# Patient Record
Sex: Male | Born: 1935
Health system: Southern US, Community
[De-identification: ages and names within clinical notes are randomized; demographics above are authoritative.]

## PROBLEM LIST (undated history)

## (undated) DIAGNOSIS — K5790 Diverticulosis of intestine, part unspecified, without perforation or abscess without bleeding: Secondary | ICD-10-CM

## (undated) DIAGNOSIS — D61818 Other pancytopenia: Secondary | ICD-10-CM

## (undated) DIAGNOSIS — D649 Anemia, unspecified: Secondary | ICD-10-CM

## (undated) DIAGNOSIS — C8300 Small cell B-cell lymphoma, unspecified site: Secondary | ICD-10-CM

## (undated) HISTORY — DX: Small cell B-cell lymphoma, unspecified site: C83.00

## (undated) HISTORY — DX: Anemia, unspecified: D64.9

## (undated) HISTORY — DX: Diverticulosis of intestine, part unspecified, without perforation or abscess without bleeding: K57.90

## (undated) HISTORY — DX: Other pancytopenia: D61.818

## (undated) HISTORY — PX: CATARACT EXTRACTION: SUR2

## (undated) HISTORY — PX: TONSILLECTOMY: SUR1361

---

## 2009-06-26 ENCOUNTER — Ambulatory Visit (HOSPITAL_COMMUNITY): Admission: RE | Admit: 2009-06-26 | Discharge: 2009-06-26 | Payer: Self-pay | Admitting: Ophthalmology

## 2009-07-03 ENCOUNTER — Ambulatory Visit (HOSPITAL_COMMUNITY): Admission: RE | Admit: 2009-07-03 | Discharge: 2009-07-03 | Payer: Self-pay | Admitting: Ophthalmology

## 2011-02-21 LAB — BASIC METABOLIC PANEL
BUN: 16 mg/dL (ref 6–23)
CO2: 29 mEq/L (ref 19–32)
Calcium: 8.9 mg/dL (ref 8.4–10.5)
GFR calc non Af Amer: >60 mL/min (ref >60)
Glucose, Bld: 97 mg/dL (ref 70–99)
Sodium: 138 mEq/L (ref 135–145)

## 2012-06-14 ENCOUNTER — Encounter (INDEPENDENT_AMBULATORY_CARE_PROVIDER_SITE_OTHER): Payer: Self-pay | Admitting: Internal Medicine

## 2012-06-14 DIAGNOSIS — D696 Thrombocytopenia, unspecified: Secondary | ICD-10-CM

## 2012-06-14 DIAGNOSIS — D72819 Decreased white blood cell count, unspecified: Secondary | ICD-10-CM

## 2012-10-05 ENCOUNTER — Encounter (INDEPENDENT_AMBULATORY_CARE_PROVIDER_SITE_OTHER): Payer: Medicare Other | Admitting: Internal Medicine

## 2012-10-05 DIAGNOSIS — D72819 Decreased white blood cell count, unspecified: Secondary | ICD-10-CM

## 2012-10-05 DIAGNOSIS — D693 Immune thrombocytopenic purpura: Secondary | ICD-10-CM

## 2013-04-11 ENCOUNTER — Encounter (INDEPENDENT_AMBULATORY_CARE_PROVIDER_SITE_OTHER): Payer: Medicare Other

## 2013-04-11 DIAGNOSIS — D696 Thrombocytopenia, unspecified: Secondary | ICD-10-CM

## 2013-04-11 DIAGNOSIS — N400 Enlarged prostate without lower urinary tract symptoms: Secondary | ICD-10-CM

## 2016-04-22 ENCOUNTER — Telehealth (HOSPITAL_COMMUNITY): Payer: Self-pay | Admitting: Oncology

## 2016-04-22 ENCOUNTER — Encounter (HOSPITAL_COMMUNITY): Payer: Self-pay | Admitting: Oncology

## 2016-04-22 DIAGNOSIS — D61818 Other pancytopenia: Secondary | ICD-10-CM | POA: Insufficient documentation

## 2016-04-22 DIAGNOSIS — D649 Anemia, unspecified: Secondary | ICD-10-CM

## 2016-04-22 HISTORY — DX: Anemia, unspecified: D64.9

## 2016-04-22 HISTORY — DX: Other pancytopenia: D61.818

## 2016-04-22 NOTE — Telephone Encounter (Signed)
Dr. Octavio Graves called regarding this patient at 850 hours today, essentially requesting an appointment sooner than planned.  She is   She notes that lab work from 03/17/16 demonstrates a HGB of 10.6 and platelet count of 95,000.  Repeat labs from 5/31 show a HGB of 9.3 and platelet count of 81,000.  She notes a neutropenia at 1.2.  She notes concern for leukemia.  No further work-up noted at this time for anemia and thrombocytopenia.    I have reviewed the faxed paper chart available to me from the time of referral labs going back in time are as follows: Labs on 5/3: WBC 2.4 HGB 10.6 MCV 89 MCHC 32 Plt 95 Neutrophil 1.2 Labs on 01/07/15 WBC 5.0 HGB 14 MCV 88.6 MCHC 32.4 PLT 133 Neutrophils 2.8 Labs from 06/01/2012 WBC 2.6 HGB 12.9 MCV 90.3 MCHC 32.5 PLT 97 Neutrophils 1.2  Due to Dr. Rosanne Ashing concern, we will set the patient up for a lab appointment and based upon results, we will adjust the patient's appointment accordingly.  Based upon the patient's past lab work and records available to me, his labs are relatively stable EXCEPT for a new anemia.  His platelet count is not changed much compared to 2013 and he has a neutropenia dating back to at least 2013 as well.  Labs: CBC diff, CMET, LDH, anemia panel, ESR, CRP, Retic count, haptoglobin, EPO level, SPEP + IFE, light chain assay, pathologist smear review, and FLOW Cytometry.  Stool cards x 3.  Based upon lab results as mentioned above, we will adjust the patient's consultation appointment accordingly.  I will contact Dr. Melina Copa personally to update her regarding our plan of action and we will contact the patient to schedule a lab appointment.  Rheana Casebolt, PA-C 04/22/2016 9:06 AM

## 2016-04-23 ENCOUNTER — Other Ambulatory Visit (HOSPITAL_COMMUNITY): Payer: Self-pay | Admitting: Oncology

## 2016-04-23 ENCOUNTER — Encounter (HOSPITAL_COMMUNITY): Payer: Medicare Other | Attending: Hematology & Oncology

## 2016-04-23 DIAGNOSIS — D61818 Other pancytopenia: Secondary | ICD-10-CM | POA: Insufficient documentation

## 2016-04-23 DIAGNOSIS — R5383 Other fatigue: Secondary | ICD-10-CM | POA: Insufficient documentation

## 2016-04-23 DIAGNOSIS — D696 Thrombocytopenia, unspecified: Secondary | ICD-10-CM | POA: Diagnosis not present

## 2016-04-23 DIAGNOSIS — Z9889 Other specified postprocedural states: Secondary | ICD-10-CM | POA: Insufficient documentation

## 2016-04-23 DIAGNOSIS — Z79899 Other long term (current) drug therapy: Secondary | ICD-10-CM | POA: Diagnosis not present

## 2016-04-23 DIAGNOSIS — Z87891 Personal history of nicotine dependence: Secondary | ICD-10-CM | POA: Insufficient documentation

## 2016-04-23 DIAGNOSIS — D649 Anemia, unspecified: Secondary | ICD-10-CM | POA: Diagnosis not present

## 2016-04-23 LAB — COMPREHENSIVE METABOLIC PANEL
ALBUMIN: 3.5 g/dL (ref 3.5–5.0)
ALK PHOS: 106 U/L (ref 38–126)
ALT: 13 U/L — AB (ref 17–63)
AST: 19 U/L (ref 15–41)
Anion gap: 5 (ref 5–15)
BILIRUBIN TOTAL: 0.5 mg/dL (ref 0.3–1.2)
BUN: 21 mg/dL — AB (ref 6–20)
CALCIUM: 8.6 mg/dL — AB (ref 8.9–10.3)
CO2: 26 mmol/L (ref 22–32)
CREATININE: 1.48 mg/dL — AB (ref 0.61–1.24)
Chloride: 103 mmol/L (ref 101–111)
GFR calc Af Amer: 49 mL/min — ABNORMAL LOW (ref >60)
GFR calc non Af Amer: 42 mL/min — ABNORMAL LOW (ref >60)
GLUCOSE: 111 mg/dL — AB (ref 65–99)
Potassium: 4.1 mmol/L (ref 3.5–5.1)
SODIUM: 134 mmol/L — AB (ref 135–145)
Total Protein: 7.4 g/dL (ref 6.5–8.1)

## 2016-04-23 LAB — RETICULOCYTES
RBC.: 3.13 MIL/uL — AB (ref 4.22–5.81)
RETIC COUNT ABSOLUTE: 53.2 10*3/uL (ref 19.0–186.0)
Retic Ct Pct: 1.7 % (ref 0.4–3.1)

## 2016-04-23 LAB — CBC WITH DIFFERENTIAL/PLATELET
BASOS PCT: 1 %
Basophils Absolute: 0 10*3/uL (ref 0.0–0.1)
EOS ABS: 0.1 10*3/uL (ref 0.0–0.7)
Eosinophils Relative: 5 %
HEMATOCRIT: 27.1 % — AB (ref 39.0–52.0)
HEMOGLOBIN: 9 g/dL — AB (ref 13.0–17.0)
Lymphocytes Relative: 36 %
Lymphs Abs: 0.8 10*3/uL (ref 0.7–4.0)
MCH: 28.8 pg (ref 26.0–34.0)
MCHC: 33.2 g/dL (ref 30.0–36.0)
MCV: 86.6 fL (ref 78.0–100.0)
Monocytes Absolute: 0.2 10*3/uL (ref 0.1–1.0)
Monocytes Relative: 11 %
NEUTROS ABS: 1 10*3/uL — AB (ref 1.7–7.7)
NEUTROS PCT: 48 %
Platelets: 77 10*3/uL — ABNORMAL LOW (ref 150–400)
RBC: 3.13 MIL/uL — AB (ref 4.22–5.81)
RDW: 15.1 % (ref 11.5–15.5)
WBC: 2.1 10*3/uL — AB (ref 4.0–10.5)

## 2016-04-23 LAB — SEDIMENTATION RATE: Sed Rate: 70 mm/hr — ABNORMAL HIGH (ref 0–16)

## 2016-04-23 LAB — C-REACTIVE PROTEIN: CRP: 1.8 mg/dL — ABNORMAL HIGH (ref <1.0)

## 2016-04-23 LAB — IRON AND TIBC
IRON: 28 ug/dL — AB (ref 45–182)
Saturation Ratios: 13 % — ABNORMAL LOW (ref 17.9–39.5)
TIBC: 223 ug/dL — AB (ref 250–450)
UIBC: 195 ug/dL

## 2016-04-23 LAB — FERRITIN: FERRITIN: 77 ng/mL (ref 24–336)

## 2016-04-23 LAB — LACTATE DEHYDROGENASE: LDH: 130 U/L (ref 98–192)

## 2016-04-23 LAB — FOLATE: FOLATE: 36.5 ng/mL (ref >5.9)

## 2016-04-23 LAB — VITAMIN B12: Vitamin B-12: 241 pg/mL (ref 180–914)

## 2016-04-24 LAB — ERYTHROPOIETIN: ERYTHROPOIETIN: 62.3 m[IU]/mL — AB (ref 2.6–18.5)

## 2016-04-24 LAB — IGG, IGA, IGM
IgA: 198 mg/dL (ref 61–437)
IgG (Immunoglobin G), Serum: 733 mg/dL (ref 700–1600)
IgM, Serum: 1840 mg/dL — ABNORMAL HIGH (ref 15–143)

## 2016-04-24 LAB — HAPTOGLOBIN: HAPTOGLOBIN: 40 mg/dL (ref 34–200)

## 2016-04-26 LAB — KAPPA/LAMBDA LIGHT CHAINS
KAPPA FREE LGHT CHN: 189.8 mg/L — AB (ref 3.3–19.4)
Kappa, lambda light chain ratio: 11.03 — ABNORMAL HIGH (ref 0.26–1.65)
Lambda free light chains: 17.2 mg/L (ref 5.7–26.3)

## 2016-04-26 LAB — PROTEIN ELECTROPHORESIS, SERUM
A/G RATIO SPE: 0.9 (ref 0.7–1.7)
ALBUMIN ELP: 3.4 g/dL (ref 2.9–4.4)
Alpha-1-Globulin: 0.3 g/dL (ref 0.0–0.4)
Alpha-2-Globulin: 0.5 g/dL (ref 0.4–1.0)
BETA GLOBULIN: 0.8 g/dL (ref 0.7–1.3)
GLOBULIN, TOTAL: 3.6 g/dL (ref 2.2–3.9)
Gamma Globulin: 2.1 g/dL — ABNORMAL HIGH (ref 0.4–1.8)
M-Spike, %: 1.4 g/dL — ABNORMAL HIGH
TOTAL PROTEIN ELP: 7 g/dL (ref 6.0–8.5)

## 2016-04-26 LAB — PATHOLOGIST SMEAR REVIEW

## 2016-04-26 LAB — IMMUNOFIXATION ELECTROPHORESIS
IGM, SERUM: 1917 mg/dL — AB (ref 15–143)
IgA: 191 mg/dL (ref 61–437)
IgG (Immunoglobin G), Serum: 723 mg/dL (ref 700–1600)
TOTAL PROTEIN ELP: 7.1 g/dL (ref 6.0–8.5)

## 2016-05-04 ENCOUNTER — Other Ambulatory Visit (HOSPITAL_COMMUNITY): Payer: Self-pay | Admitting: *Deleted

## 2016-05-04 DIAGNOSIS — D649 Anemia, unspecified: Secondary | ICD-10-CM

## 2016-05-04 DIAGNOSIS — D61818 Other pancytopenia: Secondary | ICD-10-CM | POA: Diagnosis not present

## 2016-05-04 LAB — OCCULT BLOOD X 1 CARD TO LAB, STOOL
FECAL OCCULT BLD: NEGATIVE
Fecal Occult Bld: NEGATIVE
Fecal Occult Bld: POSITIVE — AB

## 2016-05-05 ENCOUNTER — Other Ambulatory Visit: Payer: Self-pay | Admitting: Radiology

## 2016-05-06 ENCOUNTER — Ambulatory Visit (HOSPITAL_COMMUNITY)
Admission: RE | Admit: 2016-05-06 | Discharge: 2016-05-06 | Disposition: A | Discharge status: Home / Self Care | Payer: Medicare Other | Source: Ambulatory Visit | Attending: Oncology | Admitting: Oncology

## 2016-05-06 ENCOUNTER — Encounter (HOSPITAL_COMMUNITY): Payer: Self-pay

## 2016-05-06 DIAGNOSIS — C8580 Other specified types of non-Hodgkin lymphoma, unspecified site: Secondary | ICD-10-CM | POA: Diagnosis not present

## 2016-05-06 DIAGNOSIS — Z87891 Personal history of nicotine dependence: Secondary | ICD-10-CM | POA: Diagnosis not present

## 2016-05-06 DIAGNOSIS — D649 Anemia, unspecified: Secondary | ICD-10-CM | POA: Insufficient documentation

## 2016-05-06 DIAGNOSIS — D61818 Other pancytopenia: Secondary | ICD-10-CM

## 2016-05-06 LAB — CBC
HCT: 25.3 % — ABNORMAL LOW (ref 39.0–52.0)
Hemoglobin: 8.2 g/dL — ABNORMAL LOW (ref 13.0–17.0)
MCH: 27.7 pg (ref 26.0–34.0)
MCHC: 32.4 g/dL (ref 30.0–36.0)
MCV: 85.5 fL (ref 78.0–100.0)
PLATELETS: 85 10*3/uL — AB (ref 150–400)
RBC: 2.96 MIL/uL — ABNORMAL LOW (ref 4.22–5.81)
RDW: 15.6 % — AB (ref 11.5–15.5)
WBC: 2.3 10*3/uL — AB (ref 4.0–10.5)

## 2016-05-06 LAB — PROTIME-INR
INR: 1.18 (ref 0.00–1.49)
Prothrombin Time: 15.1 seconds (ref 11.6–15.2)

## 2016-05-06 LAB — BONE MARROW EXAM

## 2016-05-06 LAB — APTT: APTT: 34 s (ref 24–37)

## 2016-05-06 MED ORDER — MIDAZOLAM HCL 2 MG/2ML IJ SOLN
INTRAMUSCULAR | Status: AC
  Filled 2016-05-06: qty 6

## 2016-05-06 MED ORDER — FENTANYL CITRATE (PF) 100 MCG/2ML IJ SOLN
INTRAMUSCULAR | Status: AC
  Filled 2016-05-06: qty 4

## 2016-05-06 MED ORDER — FENTANYL CITRATE (PF) 100 MCG/2ML IJ SOLN
INTRAMUSCULAR | Status: AC | PRN
  Administered 2016-05-06: 25 ug via INTRAVENOUS
  Administered 2016-05-06: 50 ug via INTRAVENOUS

## 2016-05-06 MED ORDER — SODIUM CHLORIDE 0.9 % IV SOLN
Freq: Once | INTRAVENOUS | Status: AC
  Administered 2016-05-06: 08:00:00 via INTRAVENOUS

## 2016-05-06 MED ORDER — HYDROCODONE-ACETAMINOPHEN 5-325 MG PO TABS: 1.0000 | ORAL_TABLET | ORAL | Status: DC | PRN

## 2016-05-06 MED ORDER — MIDAZOLAM HCL 2 MG/2ML IJ SOLN
INTRAMUSCULAR | Status: AC | PRN
  Administered 2016-05-06 (×3): 1 mg via INTRAVENOUS

## 2016-05-06 NOTE — Procedures (Signed)
CT guided bone marrow biopsy.   3 aspirates and 2 cores obtained.  No immediate complication. Minimal blood loss.

## 2016-05-06 NOTE — Consult Note (Signed)
Chief Complaint: Patient was seen in consultation today for CT-guided bone marrow biopsy  Referring Physician(s): [facility]  Supervising Physician: Markus Daft  Patient Status: Outpatient  History of Present Illness: Paul Keith is a 82 y.o. male with history of pancytopenia and elevated kappa/lambda light chain ratio who presents today for CT-guided bone marrow biopsy for further evaluation. Patient has no known history of cancer and denies history of diabetes, coronary artery disease, hypertension or respiratory problems. He states that he does have 2 recent  lumbar compression fractures.   Past Medical History  Diagnosis Date  . Anemia 04/22/2016  . Pancytopenia ([place]) 04/22/2016    Past Surgical History  Procedure Laterality Date  . Cataract extraction Bilateral   . Tonsillectomy      Allergies: Review of patient's allergies indicates no known allergies.  Medications: Prior to Admission medications   Medication Sig Start Date End Date Taking? Authorizing Provider  ibuprofen (ADVIL,MOTRIN) 200 MG tablet Take 200 mg by mouth as needed.   Yes Historical Provider, MD  tamsulosin (FLOMAX) 0.4 MG CAPS capsule Take 0.4 mg by mouth.   Yes Historical Provider, MD     History reviewed. No pertinent family history.  Social History   Social History  . Marital Status: Married    Spouse Name: N/A  . Number of Children: N/A  . Years of Education: N/A   Social History Main Topics  . Smoking status: Former Smoker    Quit date: 09/15/1962  . Smokeless tobacco: Never Used  . Alcohol Use: Yes     Comment: OCCASSIONAL  . Drug Use: No  . Sexual Activity: Not Asked   Other Topics Concern  . None   Social History Narrative      Review of Systems patient denies fever, headache, chest pain, dyspnea, cough, abdominal pain, nausea, vomiting or abnormal bleeding. He has had weight loss, fatigue and intermittent back pain.  Vital Signs: BP 134/69 mmHg  Pulse 84   Temp(Src) 98.2 F (36.8 C) (Oral)  Resp 18  Ht 6' (1.829 m)  Wt 168 lb 3.2 oz (76.295 kg)  BMI 22.81 kg/m2  SpO2 100%  Physical Exam patient awake, alert. Chest clear to auscultation bilaterally. Heart with regular rate and rhythm. Abdomen soft, positive bowel sounds, nontender. Lower extremities with no significant edema  Mallampati Score:     Imaging: No results found.  Labs:  CBC:  Recent Labs  04/23/16 1005 05/06/16 0735  WBC 2.1* 2.3*  HGB 9.0* 8.2*  HCT 27.1* 25.3*  PLT 77* 85*    COAGS:  Recent Labs  05/06/16 0735  INR 1.18  APTT 34    BMP:  Recent Labs  04/23/16 1005  NA 134*  K 4.1  CL 103  CO2 26  GLUCOSE 111*  BUN 21*  CALCIUM 8.6*  CREATININE 1.48*  GFRNONAA 42*  GFRAA 49*    LIVER FUNCTION TESTS:  Recent Labs  04/23/16 1005  BILITOT 0.5  AST 19  ALT 13*  ALKPHOS 106  PROT 7.4  ALBUMIN 3.5    TUMOR MARKERS: No results for input(s): AFPTM, CEA, CA199, CHROMGRNA in the last 8760 hours.  Assessment and Plan: Patient with history of pancytopenia/elevated kappa/lambda light chain ratio. Plan today is for CT-guided bone marrow biopsy for further evaluation.Risks and benefits discussed with the patient/family including, but not limited to bleeding, infection, damage to adjacent structures or low yield requiring additional tests.All of the patient's questions were answered, patient is agreeable to proceed.Consent  signed and in chart.     Thank you for this interesting consult.  I greatly enjoyed meeting Youcef Klas and look forward to participating in their care.  A copy of this report was sent to the requesting provider on this date.  Electronically Signed: D. Rowe Robert 05/06/2016, 8:22 AM   I spent a total of 20 minutes   in face to face in clinical consultation, greater than 50% of which was counseling/coordinating care for CT guided bone marrow biopsy

## 2016-05-06 NOTE — Discharge Instructions (Signed)
Bone Marrow Aspiration and Bone Marrow Biopsy °Bone marrow aspiration and bone marrow biopsy are procedures that are done to diagnose blood disorders. You may also have one of these procedures to help diagnose infections or some types of cancer. °Bone marrow is the soft tissue that is inside your bones. Blood cells are produced in bone marrow. For bone marrow aspiration, a sample of tissue in liquid form is removed from inside your bone. For a bone marrow biopsy, a small core of bone marrow tissue is removed. Then these samples are examined under a microscope or tested in a lab. °You may need these procedures if you have an abnormal complete blood count (CBC). The aspiration or biopsy sample is usually taken from the top of your hip bone. Sometimes, an aspiration sample is taken from your chest bone (sternum). °LET YOUR HEALTH CARE PROVIDER KNOW ABOUT: °· Any allergies you have. °· All medicines you are taking, including vitamins, herbs, eye drops, creams, and over-the-counter medicines. °· Previous problems you or members of your family have had with the use of anesthetics. °· Any blood disorders you have. °· Previous surgeries you have had. °· Any medical conditions you may have. °· Whether you are pregnant or you think that you may be pregnant. °RISKS AND COMPLICATIONS °Generally, this is a safe procedure. However, problems may occur, including: °· Infection. °· Bleeding. °BEFORE THE PROCEDURE °· Ask your health care provider about: °¨ Changing or stopping your regular medicines. This is especially important if you are taking diabetes medicines or blood thinners. °¨ Taking medicines such as aspirin and ibuprofen. These medicines can thin your blood. Do not take these medicines before your procedure if your health care provider instructs you not to. °· Plan to have someone take you home after the procedure. °· If you go home right after the procedure, plan to have someone with you for 24 hours. °PROCEDURE  °· An  IV tube may be inserted into one of your veins. °· The injection site will be cleaned with a germ-killing solution (antiseptic). °· You will be given one or more of the following: °¨ A medicine that helps you relax (sedative). °¨ A medicine that numbs the area (local anesthetic). °· The bone marrow sample will be removed as follows: °¨ For an aspiration, a hollow needle will be inserted through your skin and into your bone. Bone marrow fluid will be drawn up into a syringe. °¨ For a biopsy, your health care provider will use a hollow needle to remove a core of tissue from your bone marrow. °· The needle will be removed. °· A bandage (dressing) will be placed over the insertion site and taped in place. °The procedure may vary among health care providers and hospitals. °AFTER THE PROCEDURE °· Your blood pressure, heart rate, breathing rate, and blood oxygen level will be monitored often until the medicines you were given have worn off. °· Return to your normal activities as directed by your health care provider. °  °This information is not intended to replace advice given to you by your health care provider. Make sure you discuss any questions you have with your health care provider. °  °Document Released: 11/04/2004 Document Revised: 03/18/2015 Document Reviewed: 10/23/2014 °Elsevier Interactive Patient Education ©2016 [employer] ° °Bone Marrow Aspiration and Bone Marrow Biopsy, Care After °Refer to this sheet in the next few weeks. These instructions provide you with information about caring for yourself after your procedure. Your health care provider may also give   you more specific instructions. Your treatment has been planned according to current medical practices, but problems sometimes occur. Call your health care provider if you have any problems or questions after your procedure. °WHAT TO EXPECT AFTER THE PROCEDURE °After your procedure, it is common to have: °· Soreness or tenderness around the puncture  site. °· Bruising. °HOME CARE INSTRUCTIONS °· Take medicines only as directed by your health care provider. °· Follow your health care provider's instructions about: °¨ Puncture site care. °¨ Bandage (dressing) changes and removal. °· Bathe and shower as directed by your health care provider. °· Check your puncture site every day for signs of infection. Watch for: °¨ Redness, swelling, or pain. °¨ Fluid, blood, or pus. °· Return to your normal activities as directed by your health care provider. °· Keep all follow-up visits as directed by your health care provider. This is important. °SEEK MEDICAL CARE IF: °· You have a fever. °· You have uncontrollable bleeding. °· You have redness, swelling, or pain at the site of your puncture. °· You have fluid, blood, or pus coming from your puncture site. °  °This information is not intended to replace advice given to you by your health care provider. Make sure you discuss any questions you have with your health care provider. °  °Document Released: 05/21/2005 Document Revised: 03/18/2015 Document Reviewed: 10/23/2014 °Elsevier Interactive Patient Education ©2016 [employer]. ° °Moderate Conscious Sedation, Adult, Care After °Refer to this sheet in the next few weeks. These instructions provide you with information on caring for yourself after your procedure. Your health care provider may also give you more specific instructions. Your treatment has been planned according to current medical practices, but problems sometimes occur. Call your health care provider if you have any problems or questions after your procedure. °WHAT TO EXPECT AFTER THE PROCEDURE  °After your procedure: °· You may feel sleepy, clumsy, and have poor balance for several hours. °· Vomiting may occur if you eat too soon after the procedure. °HOME CARE INSTRUCTIONS °· Do not participate in any activities where you could become injured for at least 24 hours. Do not: °¨ Drive. °¨ Swim. °¨ Ride a  bicycle. °¨ Operate heavy machinery. °¨ Cook. °¨ Use power tools. °¨ Climb ladders. °¨ Work from a high place. °· Do not make important decisions or sign legal documents until you are improved. °· If you vomit, drink water, juice, or soup when you can drink without vomiting. Make sure you have little or no nausea before eating solid foods. °· Only take over-the-counter or prescription medicines for pain, discomfort, or fever as directed by your health care provider. °· Make sure you and your family fully understand everything about the medicines given to you, including what side effects may occur. °· You should not drink alcohol, take sleeping pills, or take medicines that cause drowsiness for at least 24 hours. °· If you smoke, do not smoke without supervision. °· If you are feeling better, you may resume normal activities 24 hours after you were sedated. °· Keep all appointments with your health care provider. °SEEK MEDICAL CARE IF: °· Your skin is pale or bluish in color. °· You continue to feel nauseous or vomit. °· Your pain is getting worse and is not helped by medicine. °· You have bleeding or swelling. °· You are still sleepy or feeling clumsy after 24 hours. °SEEK IMMEDIATE MEDICAL CARE IF: °· You develop a rash. °· You have difficulty breathing. °· You develop any   type of allergic problem. °· You have a fever. °MAKE SURE YOU: °· Understand these instructions. °· Will watch your condition. °· Will get help right away if you are not doing well or get worse. °  °This information is not intended to replace advice given to you by your health care provider. Make sure you discuss any questions you have with your health care provider. °  °Document Released: 08/22/2013 Document Revised: 11/22/2014 Document Reviewed: 08/22/2013 °Elsevier Interactive Patient Education ©2016 Elsevier Inc. ° °

## 2016-05-07 ENCOUNTER — Encounter (HOSPITAL_COMMUNITY): Payer: Self-pay

## 2016-05-07 ENCOUNTER — Encounter (HOSPITAL_BASED_OUTPATIENT_CLINIC_OR_DEPARTMENT_OTHER): Payer: Medicare Other | Admitting: Hematology

## 2016-05-07 ENCOUNTER — Encounter (HOSPITAL_COMMUNITY): Payer: Medicare Other

## 2016-05-07 VITALS — BP 124/67 | HR 84 | Temp 98.3°F | Resp 16 | Ht 72.0 in | Wt 172.7 lb

## 2016-05-07 DIAGNOSIS — D72819 Decreased white blood cell count, unspecified: Secondary | ICD-10-CM

## 2016-05-07 DIAGNOSIS — D472 Monoclonal gammopathy: Secondary | ICD-10-CM | POA: Diagnosis not present

## 2016-05-07 DIAGNOSIS — D479 Neoplasm of uncertain behavior of lymphoid, hematopoietic and related tissue, unspecified: Secondary | ICD-10-CM

## 2016-05-07 DIAGNOSIS — D649 Anemia, unspecified: Secondary | ICD-10-CM | POA: Diagnosis not present

## 2016-05-07 DIAGNOSIS — Z87891 Personal history of nicotine dependence: Secondary | ICD-10-CM

## 2016-05-07 DIAGNOSIS — D61818 Other pancytopenia: Secondary | ICD-10-CM

## 2016-05-07 DIAGNOSIS — D696 Thrombocytopenia, unspecified: Secondary | ICD-10-CM

## 2016-05-07 DIAGNOSIS — D709 Neutropenia, unspecified: Secondary | ICD-10-CM

## 2016-05-07 DIAGNOSIS — C8300 Small cell B-cell lymphoma, unspecified site: Secondary | ICD-10-CM

## 2016-05-07 HISTORY — DX: Small cell B-cell lymphoma, unspecified site: C83.00

## 2016-05-07 LAB — CBC WITH DIFFERENTIAL/PLATELET
BASOS PCT: 0 %
Basophils Absolute: 0 10*3/uL (ref 0.0–0.1)
EOS ABS: 0.1 10*3/uL (ref 0.0–0.7)
Eosinophils Relative: 2 %
HCT: 26.3 % — ABNORMAL LOW (ref 39.0–52.0)
Hemoglobin: 8.6 g/dL — ABNORMAL LOW (ref 13.0–17.0)
LYMPHS PCT: 37 %
Lymphs Abs: 1 10*3/uL (ref 0.7–4.0)
MCH: 28.4 pg (ref 26.0–34.0)
MCHC: 32.7 g/dL (ref 30.0–36.0)
MCV: 86.8 fL (ref 78.0–100.0)
Monocytes Absolute: 0.2 10*3/uL (ref 0.1–1.0)
Monocytes Relative: 7 %
NEUTROS PCT: 54 %
Neutro Abs: 1.3 10*3/uL — ABNORMAL LOW (ref 1.7–7.7)
PLATELETS: 83 10*3/uL — AB (ref 150–400)
RBC: 3.03 MIL/uL — AB (ref 4.22–5.81)
RDW: 15.4 % (ref 11.5–15.5)
WBC: 2.6 10*3/uL — ABNORMAL LOW (ref 4.0–10.5)

## 2016-05-07 LAB — RETICULOCYTES
RBC.: 3.03 MIL/uL — ABNORMAL LOW (ref 4.22–5.81)
Retic Count, Absolute: 54.5 10*3/uL (ref 19.0–186.0)
Retic Ct Pct: 1.8 % (ref 0.4–3.1)

## 2016-05-07 LAB — COMPREHENSIVE METABOLIC PANEL
ALT: 12 U/L — ABNORMAL LOW (ref 17–63)
ANION GAP: 5 (ref 5–15)
AST: 16 U/L (ref 15–41)
Albumin: 3.4 g/dL — ABNORMAL LOW (ref 3.5–5.0)
Alkaline Phosphatase: 85 U/L (ref 38–126)
BUN: 24 mg/dL — ABNORMAL HIGH (ref 6–20)
CALCIUM: 8.7 mg/dL — AB (ref 8.9–10.3)
CHLORIDE: 104 mmol/L (ref 101–111)
CO2: 26 mmol/L (ref 22–32)
Creatinine, Ser: 1.42 mg/dL — ABNORMAL HIGH (ref 0.61–1.24)
GFR calc non Af Amer: 44 mL/min — ABNORMAL LOW (ref >60)
GFR, EST AFRICAN AMERICAN: 51 mL/min — AB (ref >60)
GLUCOSE: 96 mg/dL (ref 65–99)
POTASSIUM: 4.4 mmol/L (ref 3.5–5.1)
SODIUM: 135 mmol/L (ref 135–145)
Total Bilirubin: 0.7 mg/dL (ref 0.3–1.2)
Total Protein: 7.1 g/dL (ref 6.5–8.1)

## 2016-05-07 LAB — LACTATE DEHYDROGENASE: LDH: 118 U/L (ref 98–192)

## 2016-05-07 LAB — URIC ACID: Uric Acid, Serum: 6.7 mg/dL (ref 4.4–7.6)

## 2016-05-07 NOTE — Patient Instructions (Signed)
[place] at [facility] Discharge Instructions  RECOMMENDATIONS MADE BY THE CONSULTANT AND ANY TEST RESULTS WILL BE SENT TO YOUR REFERRING PHYSICIAN.  Lab work today.   Return with 24 hours urine after completion. Need pet scan. Return to see Dr. Whitney Muse in 7-10 days after scan   Thank you for choosing [place] at [facility] to provide your oncology and hematology care.  To afford each patient quality time with our provider, please arrive at least 15 minutes before your scheduled appointment time.   Beginning January 23rd 2017 lab work for the [name] will be done in the  Main lab at [facility] on 1st floor. If you have a lab appointment with the [facility] please come in thru the  Main Entrance and check in at the main information desk  You need to re-schedule your appointment should you arrive 10 or more minutes late.  We strive to give you quality time with our providers, and arriving late affects you and other patients whose appointments are after yours.  Also, if you no show three or more times for appointments you may be dismissed from the clinic at the providers discretion.     Again, thank you for choosing [facility].  Our hope is that these requests will decrease the amount of time that you wait before being seen by our physicians.       _____________________________________________________________  Should you have questions after your visit to [facility], please contact our office at [phone] between the hours of 8:30 a.m. and 4:30 p.m.  Voicemails left after 4:30 p.m. will not be returned until the following business day.  For prescription refill requests, have your pharmacy contact our office.         Resources For Cancer Patients and their Caregivers ? American Cancer Society: Can assist with transportation, wigs, general needs, runs Look Good Feel Better.         470-747-8500 ? Cancer Care: Provides financial assistance, online support groups, medication/co-pay assistance.  1-800-813-HOPE 5807374365) ? [place] Assists [place] cancer patients and their families through emotional , educational and financial support.  640-662-7306 ? [place] DSS Where to apply for food stamps, Medicaid and utility assistance. 938-469-9909 ? [facility]: Transportation to medical appointments. 772-720-2931 ? Social Security Administration: May apply for disability if have a Stage IV cancer. 2537354154 (931)710-8712 ? [employer], Disability and Transit Services: Assists with nutrition, care and transit needs. [place] Support Programs: @10RELATIVEDAYS @ > Cancer Support Group  2nd Tuesday of the month 1pm-2pm, Journey Room  > Creative Journey  3rd Tuesday of the month 1130am-1pm, Journey Room  > Look Good Feel Better  1st Wednesday of the month 10am-12 noon, Journey Room (Call [place] to register [phone])

## 2016-05-07 NOTE — Progress Notes (Signed)
[name] Kitchen    HEMATOLOGY/ONCOLOGY CONSULTATION NOTE  Date of Service: 05/07/2016  Patient Care Team: Octavio Graves, DO as PCP - General  CHIEF COMPLAINTS/PURPOSE OF CONSULTATION:   Pancytopenia.  HISTORY OF PRESENTING ILLNESS:   Paul Keith is a wonderful 82 y.o. male who has been referred to [place] by Dr .Octavio Graves, DO for evaluation and management of pancytopenia.  Patient is a fairly healthy and active gentleman with no significant chronic medical issues who reports having progressive fatigue over the last several months and especially over the last 6 weeks. He also reports that he was doing some yard work and picking up some wood lower back pain and was noted to have what he describes as an L4-L5 fracture diagnosed on an x-ray of his lumbar spine about 6 weeks ago. He reports that he has not had any other imaging for his spine. He has been taking Vicodin and ibuprofen as needed for his pain. He reports no overt bruising or bleeding. No issues with infections fevers or chills. Fatigue as noted above but no lightheadedness or dizziness or presyncopal symptoms. No chest pain or acute shortness of breath or significant dyspnea on exertion. Does report having lost about 22 pounds since late last year though he reports that he has been more physically active and has been eating better.  Notes no fevers chills night sweats.  Labs done by his primary care physician on 04/23/2016 showed pancytopenia hemoglobin of 9 with an MCV of 86.6, WBC count of 2.1k with an ANC of 1k, platelets of 77k.  He was noted to have an elevated sedimentation rate of 70, with an SPEP showing an M spike of 1.4 g/dL IgM level of 1917 with immunofixation electrophoresis showing an IgM monoclonal protein with kappa Light Chain Specificity. Serum free light chains showed significantly elevated kappa light chains. Peripheral blood flow cytometry showed a minor CD5 positive lymphocyte population which was CD23  negative.  Patient subsequently had a bone marrow biopsy on 05/06/2016. Final pathology results from his bone marrow are currently pending. I discussed the preliminary results with Dr.Smirr who reports of the bone marrow shows significant lymphocytic infiltration. No significant increase in plasma cells. LPL and MCL are high on the differential diagnosis.   MEDICAL HISTORY:  Past Medical History  Diagnosis Date  . Anemia 04/22/2016  . Pancytopenia ([place]) 04/22/2016  BPH L4-L5 fracture 6 weeks ago Vasomotor rhinitis for several years.  SURGICAL HISTORY: Past Surgical History  Procedure Laterality Date  . Cataract extraction Bilateral   . Tonsillectomy      SOCIAL HISTORY: Social History   Social History  . Marital Status: Married    Spouse Name: N/A  . Number of Children: N/A  . Years of Education: N/A   Occupational History  . Not on file.   Social History Main Topics  . Smoking status: Former Smoker    Quit date: 09/15/1962  . Smokeless tobacco: Never Used  . Alcohol Use: Yes     Comment: OCCASSIONAL  . Drug Use: No  . Sexual Activity: Not on file   Other Topics Concern  . Not on file   Social History Narrative    FAMILY HISTORY: History reviewed. No pertinent family history.  ALLERGIES:  has No Known Allergies.  MEDICATIONS:  Current Outpatient Prescriptions  Medication Sig Dispense Refill  . ibuprofen (ADVIL,MOTRIN) 200 MG tablet Take 200 mg by mouth as needed.    . tamsulosin (FLOMAX) 0.4 MG CAPS capsule Take 0.4 mg by mouth.  No current facility-administered medications for this visit.    REVIEW OF SYSTEMS:    10 Point review of Systems was done is negative except as noted above.  PHYSICAL EXAMINATION: ECOG PERFORMANCE STATUS: 1 - Symptomatic but completely ambulatory  . Filed Vitals:   05/07/16 1043  BP: 124/67  Pulse: 84  Temp: 98.3 F (36.8 C)  Resp: 16   Filed Weights   05/07/16 1043  Weight: 172 lb 11.2 oz (78.336 kg)   .Body  mass index is 23.42 kg/(m^2).  GENERAL:alert, in no acute distress and comfortable SKIN: skin color, texture, turgor are normal, no rashes or significant lesions EYES: normal, conjunctiva are pink and non-injected, sclera clear OROPHARYNX:no exudate, no erythema and lips, buccal mucosa, and tongue normal  NECK: supple, no JVD, thyroid normal size, non-tender, without nodularity LYMPH:  no palpable lymphadenopathy in the cervical, axillary or inguinal LUNGS: clear to auscultation with normal respiratory effort HEART: regular rate & rhythm,  no murmurs and no lower extremity edema ABDOMEN: abdomen soft, non-tender, normoactive bowel sounds no overt palpable hepatosplenomegaly Musculoskeletal: no cyanosis of digits and no clubbing  PSYCH: alert & oriented x 3 with fluent speech NEURO: no focal motor/sensory deficits  LABORATORY DATA:  I have reviewed the data as listed  . CBC Latest Ref Rng 05/07/2016 05/06/2016 04/23/2016  WBC 4.0 - 10.5 K/uL 2.6(L) 2.3(L) 2.1(L)  Hemoglobin 13.0 - 17.0 g/dL 8.6(L) 8.2(L) 9.0(L)  Hematocrit 39.0 - 52.0 % 26.3(L) 25.3(L) 27.1(L)  Platelets 150 - 400 K/uL 83(L) 85(L) 77(L)    . CMP Latest Ref Rng 05/07/2016 04/23/2016 06/12/2009  Glucose 65 - 99 mg/dL 96 111(H) 97  BUN 6 - 20 mg/dL 24(H) 21(H) 16  Creatinine 0.61 - 1.24 mg/dL 1.42(H) 1.48(H) 1.15  Sodium 135 - 145 mmol/L 135 134(L) 138  Potassium 3.5 - 5.1 mmol/L 4.4 4.1 4.1  Chloride 101 - 111 mmol/L 104 103 104  CO2 22 - 32 mmol/L '26 26 29  ' Calcium 8.9 - 10.3 mg/dL 8.7(L) 8.6(L) 8.9  Total Protein 6.5 - 8.1 g/dL 7.1 7.4 -  Total Bilirubin 0.3 - 1.2 mg/dL 0.7 0.5 -  Alkaline Phos 38 - 126 U/L 85 106 -  AST 15 - 41 U/L 16 19 -  ALT 17 - 63 U/L 12(L) 13(L) -   Component     Latest Ref Rng 04/23/2016 04/23/2016 05/04/2016 05/04/2016        10:05 AM 10:25 AM  5:00 PM  5:05 PM  Total Protein ELP     6.0 - 8.5 g/dL 7.0 7.1    Albumin ELP     2.9 - 4.4 g/dL 3.4     Alpha-1-Globulin     0.0 - 0.4 g/dL 0.3      Alpha-2-Globulin     0.4 - 1.0 g/dL 0.5     Beta Globulin     0.7 - 1.3 g/dL 0.8     Gamma Globulin     0.4 - 1.8 g/dL 2.1 (H)     M-SPIKE, %     Not Observed g/dL 1.4 (H)     SPE Interp.      Comment     Comment      Comment     Globulin, Total     2.2 - 3.9 g/dL 3.6     A/G Ratio     0.7 - 1.7 0.9     IgG (Immunoglobin G), Serum     700 - 1600 mg/dL 733 723  IgA     61 - 437 mg/dL 198 191    IgM, Serum     15 - 143 mg/dL 1840 (H) 1917 (H)    Immunofixation Result, Serum       Comment    Iron     45 - 182 ug/dL 28 (L)     TIBC     250 - 450 ug/dL 223 (L)     Saturation Ratios     17.9 - 39.5 % 13 (L)     UIBC      195     Retic Ct Pct     0.4 - 3.1 % 1.7     RBC.     4.22 - 5.81 MIL/uL 3.13 (L)     Retic Count, Manual     19.0 - 186.0 K/uL 53.2     Kappa free light chain     3.3 - 19.4 mg/L 189.8 (H)     Lamda free light chains     5.7 - 26.3 mg/L 17.2     Kappa, lamda light chain ratio     0.26 - 1.65 11.03 (H)     Vitamin B12     180 - 914 pg/mL 241     Folate     >5.9 ng/mL 36.5     Ferritin     24 - 336 ng/mL 77     Erythropoietin     2.6 - 18.5 mIU/mL 62.3 (H)     LDH     98 - 192 U/L 130     CRP     <1.0 mg/dL 1.8 (H)     Haptoglobin     34 - 200 mg/dL 40     Sed Rate     0 - 16 mm/hr 70 (H)     Fecal Occult Blood, POC     NEGATIVE   NEGATIVE NEGATIVE  Immunofixation electrophoresis      The value has a corrected status.      No reference range information available      Comments:           (NOTE)           Immunofixation shows IgM monoclonal protein with kappa light           chain           specificity.   RADIOGRAPHIC STUDIES: I have personally reviewed the radiological images as listed and agreed with the findings in the report. Ct Biopsy  05/06/2016  INDICATION: 82 year old with pancytopenia. EXAM: CT GUIDED BONE MARROW ASPIRATES AND BIOPSY Physician: Stephan Minister. Anselm Pancoast, MD MEDICATIONS: None. ANESTHESIA/SEDATION: Fentanyl 75 mcg IV;  Versed 3.0 mg IV Moderate Sedation Time:  13 minutes The patient was continuously monitored during the procedure by the interventional radiology nurse under my direct supervision. COMPLICATIONS: None immediate. PROCEDURE: The procedure was explained to the patient. The risks and benefits of the procedure were discussed and the patient's questions were addressed. Informed consent was obtained from the patient. The patient was placed prone on CT scan. Images of the pelvis were obtained. The right side of back was prepped and draped in sterile fashion. The skin and right posterior iliac bone were anesthetized with 1% lidocaine. 11 gauge bone needle was directed into the right iliac bone with CT guidance. Three aspirates and two core biopsies obtained. Bandage placed over the puncture site. FINDINGS: Bone needle directed into the posterior right ilium. IMPRESSION: CT guided bone marrow aspirates and  core biopsy. Electronically Signed   By: Markus Daft M.D.   On: 05/06/2016 12:00    ASSESSMENT & PLAN:   82 year old Caucasian male with good performance status with minimal chronic medical comorbidities with  #1 Pancytopenia with marrow showing lymphocytic infiltration concerning for lymphoproliferative process. LPL and MCL in the top differentials.  #2 IgM Kappa monoclonal gammopathy with preliminary bone marrow picture showing lymphocytic infiltration concerning for Waldenstrom's Macroglobulinemia/lymphoplasmacytic lymphoma. IgM levels today have just crossed 2 g/dL. Patient has no clinical symptoms of hyperviscosity which I would not expect that these protein levels.  IgM  multiple myeloma would be exceptionally rare.  #3 normocytic anemia -likely due to above process. Hemoglobin is relatively stable at 8.6. No significant symptoms at this time to warrant transfusion. With elevated IgM levels would avoid hyper transfusion that could increase blood viscosity.  #4 thrombocytopenia no evidence of active  bleeding. Likely due to the same marrow infiltrative process. Completely avoid NSAIDs or other medications that could affect platelet function.  #5 leukopenia/neutropenia-likely due to lymphocytic bone marrow infiltrative process. Neutropenia as mild and the patient has no signs of active infection or fevers at this time.  #6  L4-L5 recent fracture with no significant trauma . Concern for pathologic fracture . ? Lymphomatous involvement of the spine . We will be better characterized on his PET/CT scan . Patient has currently no concerning neurological symptoms are uncontrolled back pain at this time.  PLAN -No acute indication for PRBC or platelet transfusion at this time. Discussed preliminary bone marrow biopsy results with Dr. Monica Martinez from pathology. We should have the final results sometime early next week. -We will get a PET CT scan to further evaluate the patient's lymphoproliferative disorder/lymphoma. - if needed patient might require an MRI of his lumbar spine after review of his PET/CT scan. -He was counseled to call us immediately for any bleeding issues or significant increase in fatigue or other symptoms of symptomatic anemia. -Recommended 2 completely avoid NSAIDs due to increased risk of bleeding. -We will send out an additional low labs today as noted below. -Recommended he start B complex due to borderline low B12 levels. -Definitive treatment for his lymphoproliferative syndrome would depend on his final biopsy results. If MCL could treat with BR and if LPL/WM my treatment with Rituxan+/- Bendamustine.  Return to care with Dr. Whitney Muse in 7-10 days to follow up on his bone marrow biopsy results, labs from today and PET/CT scan and with rpt CBC  . Orders Placed This Encounter  Procedures  . NM PET Image Initial (PI) Skull Base To Thigh    Standing Status: Future     Number of Occurrences:      Standing Expiration Date: 06/11/2017    Order Specific Question:  Reason for exam:      Answer:  Lymphoma with bone marrow infiltration and back involvement ? LPL vs MCL    Order Specific Question:  Preferred imaging location?    Answer:  [facility]  . CBC & Diff and Retic    Standing Status: Future     Number of Occurrences: 1     Standing Expiration Date: 05/07/2017  . Comprehensive metabolic panel    Standing Status: Future     Number of Occurrences: 1     Standing Expiration Date: 05/07/2017  . Uric acid    Standing Status: Future     Number of Occurrences: 1     Standing Expiration Date: 05/07/2017  . Lactate dehydrogenase (LDH)  Standing Status: Future     Number of Occurrences: 1     Standing Expiration Date: 05/07/2017  . IgM    Standing Status: Future     Number of Occurrences: 1     Standing Expiration Date: 05/07/2017  . Kappa/lambda light chains    Standing Status: Future     Number of Occurrences: 1     Standing Expiration Date: 05/07/2017  . IFE/Light Chains/TP Qn, 24-Hr Ur    Standing Status: Future     Number of Occurrences: 1     Standing Expiration Date: 05/07/2017  . CBC & Diff and Retic    Standing Status: Future     Number of Occurrences:      Standing Expiration Date: 05/07/2017  . Comprehensive metabolic panel    Standing Status: Future     Number of Occurrences:      Standing Expiration Date: 05/07/2017  . CBC with Differential    Standing Status: Future     Number of Occurrences: 1     Standing Expiration Date: 05/07/2017  . Reticulocytes    Standing Status: Future     Number of Occurrences: 1     Standing Expiration Date: 05/07/2017  . Type and screen    Standing Status: Future     Number of Occurrences:      Standing Expiration Date: 05/07/2017     All of the patients and his family's questions were answered in details to their apparent satisfaction. The patient knows to call the clinic with any problems, questions or concerns.  I spent 70 minutes counseling the patient face to face. The total time spent in the  appointment was 80 minutes and more than 50% was on counseling and direct patient cares.    Sullivan Lone MD [place] AAHIVMS [facility] Hematology/Oncology Physician [facility]  (Office):       [phone] (Work cell):  [phone] (Fax):           [phone]  05/07/2016 11:05 AM

## 2016-05-08 LAB — IGM: IgM, Serum: 2089 mg/dL — ABNORMAL HIGH (ref 15–143)

## 2016-05-10 LAB — KAPPA/LAMBDA LIGHT CHAINS
KAPPA FREE LGHT CHN: 205.2 mg/L — AB (ref 3.3–19.4)
Kappa, lambda light chain ratio: 13.41 — ABNORMAL HIGH (ref 0.26–1.65)
Lambda free light chains: 15.3 mg/L (ref 5.7–26.3)

## 2016-05-11 DIAGNOSIS — D61818 Other pancytopenia: Secondary | ICD-10-CM | POA: Diagnosis not present

## 2016-05-12 LAB — UIFE/LIGHT CHAINS/TP QN, 24-HR UR
% BETA, Urine: 36.6 %
ALPHA 1 URINE: 0.4 %
ALPHA 2 UR: 6 %
Albumin, U: 18.9 %
Free Kappa/Lambda Ratio: 26.38 — ABNORMAL HIGH (ref 2.04–10.37)
Free Lambda Lt Chains,Ur: 2.32 mg/L (ref 0.24–6.66)
Free Lt Chn Excr Rate: 61.2 mg/L — ABNORMAL HIGH (ref 1.35–24.19)
GAMMA GLOBULIN URINE: 38.1 %
M-SPIKE %, URINE: 17.7 % — AB
M-Spike, Mg/24 Hr: 46 mg/24 hr — ABNORMAL HIGH
TOTAL PROTEIN, URINE-UPE24: 13.9 mg/dL
TOTAL PROTEIN, URINE-UR/DAY: 257 mg/(24.h) — AB (ref 30–150)
VOLUME, URINE-UPE24: 1850 mL

## 2016-05-13 ENCOUNTER — Encounter (HOSPITAL_COMMUNITY)
Admission: RE | Admit: 2016-05-13 | Discharge: 2016-05-13 | Disposition: A | Discharge status: Home / Self Care | Payer: Medicare Other | Source: Ambulatory Visit | Attending: Hematology | Admitting: Hematology

## 2016-05-13 DIAGNOSIS — D479 Neoplasm of uncertain behavior of lymphoid, hematopoietic and related tissue, unspecified: Secondary | ICD-10-CM

## 2016-05-13 DIAGNOSIS — D61818 Other pancytopenia: Secondary | ICD-10-CM

## 2016-05-13 LAB — GLUCOSE, CAPILLARY: GLUCOSE-CAPILLARY: 102 mg/dL — AB (ref 65–99)

## 2016-05-13 MED ORDER — FLUDEOXYGLUCOSE F - 18 (FDG) INJECTION
9.4000 | Freq: Once | INTRAVENOUS | Status: AC | PRN
  Administered 2016-05-13: 9.4 via INTRAVENOUS

## 2016-05-14 ENCOUNTER — Encounter (HOSPITAL_COMMUNITY): Payer: Medicare Other

## 2016-05-14 DIAGNOSIS — D479 Neoplasm of uncertain behavior of lymphoid, hematopoietic and related tissue, unspecified: Secondary | ICD-10-CM

## 2016-05-14 DIAGNOSIS — D61818 Other pancytopenia: Secondary | ICD-10-CM | POA: Diagnosis not present

## 2016-05-14 LAB — COMPREHENSIVE METABOLIC PANEL
ALBUMIN: 3.1 g/dL — AB (ref 3.5–5.0)
ALK PHOS: 82 U/L (ref 38–126)
ALT: 17 U/L (ref 17–63)
ANION GAP: 6 (ref 5–15)
AST: 19 U/L (ref 15–41)
BUN: 27 mg/dL — ABNORMAL HIGH (ref 6–20)
CALCIUM: 8.4 mg/dL — AB (ref 8.9–10.3)
CO2: 23 mmol/L (ref 22–32)
Chloride: 105 mmol/L (ref 101–111)
Creatinine, Ser: 1.42 mg/dL — ABNORMAL HIGH (ref 0.61–1.24)
GFR calc Af Amer: 51 mL/min — ABNORMAL LOW (ref >60)
GFR calc non Af Amer: 44 mL/min — ABNORMAL LOW (ref >60)
GLUCOSE: 120 mg/dL — AB (ref 65–99)
POTASSIUM: 4 mmol/L (ref 3.5–5.1)
SODIUM: 134 mmol/L — AB (ref 135–145)
Total Bilirubin: 0.6 mg/dL (ref 0.3–1.2)
Total Protein: 7 g/dL (ref 6.5–8.1)

## 2016-05-17 ENCOUNTER — Ambulatory Visit (HOSPITAL_COMMUNITY): Payer: Medicare Other | Admitting: Hematology & Oncology

## 2016-05-17 LAB — CHROMOSOME ANALYSIS, BONE MARROW

## 2016-05-17 LAB — TISSUE HYBRIDIZATION (BONE MARROW)-NCBH

## 2016-05-19 ENCOUNTER — Other Ambulatory Visit (HOSPITAL_COMMUNITY): Payer: Self-pay | Admitting: Oncology

## 2016-05-21 ENCOUNTER — Encounter (HOSPITAL_COMMUNITY): Payer: Medicare Other | Attending: Hematology & Oncology | Admitting: Hematology

## 2016-05-21 ENCOUNTER — Encounter (HOSPITAL_COMMUNITY): Payer: Self-pay | Admitting: Hematology

## 2016-05-21 VITALS — BP 122/56 | HR 85 | Temp 98.3°F | Resp 16 | Wt 179.2 lb

## 2016-05-21 DIAGNOSIS — R5383 Other fatigue: Secondary | ICD-10-CM | POA: Diagnosis not present

## 2016-05-21 DIAGNOSIS — D649 Anemia, unspecified: Secondary | ICD-10-CM | POA: Diagnosis not present

## 2016-05-21 DIAGNOSIS — D696 Thrombocytopenia, unspecified: Secondary | ICD-10-CM | POA: Diagnosis not present

## 2016-05-21 DIAGNOSIS — Z9889 Other specified postprocedural states: Secondary | ICD-10-CM | POA: Insufficient documentation

## 2016-05-21 DIAGNOSIS — C8300 Small cell B-cell lymphoma, unspecified site: Secondary | ICD-10-CM | POA: Insufficient documentation

## 2016-05-21 DIAGNOSIS — Z87891 Personal history of nicotine dependence: Secondary | ICD-10-CM | POA: Diagnosis not present

## 2016-05-21 DIAGNOSIS — D709 Neutropenia, unspecified: Secondary | ICD-10-CM

## 2016-05-21 DIAGNOSIS — Z79899 Other long term (current) drug therapy: Secondary | ICD-10-CM | POA: Insufficient documentation

## 2016-05-21 DIAGNOSIS — C880 Waldenstrom macroglobulinemia: Secondary | ICD-10-CM

## 2016-05-21 DIAGNOSIS — D61818 Other pancytopenia: Secondary | ICD-10-CM | POA: Diagnosis not present

## 2016-05-21 DIAGNOSIS — K5780 Diverticulitis of intestine, part unspecified, with perforation and abscess without bleeding: Secondary | ICD-10-CM

## 2016-05-21 HISTORY — DX: Small cell B-cell lymphoma, unspecified site: C83.00

## 2016-05-21 LAB — CBC WITH DIFFERENTIAL/PLATELET
Basophils Absolute: 0 10*3/uL (ref 0.0–0.1)
Basophils Relative: 0 %
Eosinophils Absolute: 0.1 10*3/uL (ref 0.0–0.7)
Eosinophils Relative: 3 %
HEMATOCRIT: 30.8 % — AB (ref 39.0–52.0)
HEMOGLOBIN: 10.2 g/dL — AB (ref 13.0–17.0)
LYMPHS PCT: 24 %
Lymphs Abs: 0.7 10*3/uL (ref 0.7–4.0)
MCH: 28 pg (ref 26.0–34.0)
MCHC: 33.1 g/dL (ref 30.0–36.0)
MCV: 84.6 fL (ref 78.0–100.0)
MONO ABS: 0.3 10*3/uL (ref 0.1–1.0)
MONOS PCT: 10 %
NEUTROS ABS: 1.8 10*3/uL (ref 1.7–7.7)
Neutrophils Relative %: 63 %
Platelets: 125 10*3/uL — ABNORMAL LOW (ref 150–400)
RBC: 3.64 MIL/uL — ABNORMAL LOW (ref 4.22–5.81)
RDW: 14.7 % (ref 11.5–15.5)
WBC: 2.8 10*3/uL — ABNORMAL LOW (ref 4.0–10.5)

## 2016-05-21 LAB — COMPREHENSIVE METABOLIC PANEL
ALBUMIN: 2.7 g/dL — AB (ref 3.5–5.0)
ALT: 19 U/L (ref 17–63)
ANION GAP: 4 — AB (ref 5–15)
AST: 25 U/L (ref 15–41)
Alkaline Phosphatase: 88 U/L (ref 38–126)
BUN: 17 mg/dL (ref 6–20)
CO2: 26 mmol/L (ref 22–32)
Calcium: 8 mg/dL — ABNORMAL LOW (ref 8.9–10.3)
Chloride: 105 mmol/L (ref 101–111)
Creatinine, Ser: 1.01 mg/dL (ref 0.61–1.24)
GFR calc non Af Amer: >60 mL/min (ref >60)
GLUCOSE: 101 mg/dL — AB (ref 65–99)
POTASSIUM: 3.6 mmol/L (ref 3.5–5.1)
SODIUM: 135 mmol/L (ref 135–145)
Total Bilirubin: 0.7 mg/dL (ref 0.3–1.2)
Total Protein: 6.6 g/dL (ref 6.5–8.1)

## 2016-05-21 NOTE — Patient Instructions (Signed)
[place] at [facility] Discharge Instructions  RECOMMENDATIONS MADE BY THE CONSULTANT AND ANY TEST RESULTS WILL BE SENT TO YOUR REFERRING PHYSICIAN.  You were seen by Dr. Irene Limbo today. Lab work today. Chemo teaching class this Wednesday, July 12th at 1130 am-1pm on the 4th floor at [facility] in [place] for Education. Repeat CT scan 2 days prior to starting your chemo treatment. Return to clinic for follow-up appointment and lab work after CT scan and before starting treatment. Call clinic with any questions or concerns.   Thank you for choosing [place] at [facility] to provide your oncology and hematology care.  To afford each patient quality time with our provider, please arrive at least 15 minutes before your scheduled appointment time.   Beginning January 23rd 2017 lab work for the [name] will be done in the  Main lab at [facility] on 1st floor. If you have a lab appointment with the [facility] please come in thru the  Main Entrance and check in at the main information desk  You need to re-schedule your appointment should you arrive 10 or more minutes late.  We strive to give you quality time with our providers, and arriving late affects you and other patients whose appointments are after yours.  Also, if you no show three or more times for appointments you may be dismissed from the clinic at the providers discretion.     Again, thank you for choosing [facility], [facility], [facility].  Our hope is that these requests will decrease the amount of time that you wait before being seen by our physicians.       _____________________________________________________________  Should you have questions after your visit to [facility], please contact our office at [phone] between the hours of 8:30 a.m. and 4:30 p.m.  Voicemails left after 4:30 p.m. will not be returned until the following business day.  For prescription  refill requests, have your pharmacy contact our office.         Resources For Cancer Patients and their Caregivers ? American Cancer Society: Can assist with transportation, wigs, general needs, runs Look Good Feel Better.        (601)466-9357 ? Cancer Care: Provides financial assistance, online support groups, medication/co-pay assistance.  1-800-813-HOPE 8577640256) ? [place] Assists [place] cancer patients and their families through emotional , educational and financial support.  414-831-1534 ? [place] DSS Where to apply for food stamps, Medicaid and utility assistance. (364)161-9085 ? [facility]: Transportation to medical appointments. (804) 158-0667 ? Social Security Administration: May apply for disability if have a Stage IV cancer. (605)753-9780 9474081876 ? [employer], Disability and Transit Services: Assists with nutrition, care and transit needs. [place] Support Programs: @10RELATIVEDAYS @ > Cancer Support Group  2nd Tuesday of the month 1pm-2pm, Journey Room  > Creative Journey  3rd Tuesday of the month 1130am-1pm, Journey Room  > Look Good Feel Better  1st Wednesday of the month 10am-12 noon, Journey Room (Call [place] to register [phone])

## 2016-05-22 LAB — HEPATITIS B SURFACE ANTIBODY,QUALITATIVE: Hep B S Ab: NONREACTIVE

## 2016-05-22 LAB — HEPATITIS B CORE ANTIBODY, TOTAL: Hep B Core Total Ab: NEGATIVE

## 2016-05-22 LAB — HEPATITIS B SURFACE ANTIGEN: Hepatitis B Surface Ag: NEGATIVE

## 2016-05-26 ENCOUNTER — Encounter (HOSPITAL_COMMUNITY): Payer: Medicare Other

## 2016-05-26 DIAGNOSIS — C8300 Small cell B-cell lymphoma, unspecified site: Secondary | ICD-10-CM

## 2016-05-26 DIAGNOSIS — C880 Waldenstrom macroglobulinemia: Secondary | ICD-10-CM

## 2016-05-28 ENCOUNTER — Ambulatory Visit (HOSPITAL_COMMUNITY): Payer: Self-pay | Admitting: Hematology & Oncology

## 2016-06-01 ENCOUNTER — Ambulatory Visit (HOSPITAL_COMMUNITY)
Admission: RE | Admit: 2016-06-01 | Discharge: 2016-06-01 | Disposition: A | Discharge status: Home / Self Care | Payer: Medicare Other | Source: Ambulatory Visit | Attending: Hematology | Admitting: Hematology

## 2016-06-01 DIAGNOSIS — I700 Atherosclerosis of aorta: Secondary | ICD-10-CM | POA: Insufficient documentation

## 2016-06-01 DIAGNOSIS — K5780 Diverticulitis of intestine, part unspecified, with perforation and abscess without bleeding: Secondary | ICD-10-CM | POA: Diagnosis present

## 2016-06-01 DIAGNOSIS — N400 Enlarged prostate without lower urinary tract symptoms: Secondary | ICD-10-CM | POA: Diagnosis not present

## 2016-06-01 DIAGNOSIS — R161 Splenomegaly, not elsewhere classified: Secondary | ICD-10-CM | POA: Insufficient documentation

## 2016-06-02 NOTE — Progress Notes (Signed)
Paul Keith      HEMATOLOGY/ONCOLOGY CONSULTATION NOTE  Date of Service: 06/02/2016  Patient Care Team: Paul Graves, DO as PCP - General  CHIEF COMPLAINTS/PURPOSE OF CONSULTATION:  NHL Pancytopenia BMBX on 05/06/2016 with extensive involvement by NHL B cell, admixed plasma cell component to a lesser extent which also shows kappa light chain restriction. DDX lymphoplasmacytic lymphoma, marginal zone lymphoma, follicular lymphoma with plasma cell differentiation, splenic lymphoma. Low grade process is favored.  HISTORY OF PRESENTING ILLNESS:  Paul Keith is a wonderful 82 y.o. male who has been referred to [place] by Dr .Paul Graves, DO for evaluation and management of pancytopenia. Bone marrow involvement shows extensive involvement with NHL.  Labs done by his primary care physician on 04/23/2016 showed pancytopenia hemoglobin of 9 with an MCV of 86.6, WBC count of 2.1k with an ANC of 1k, platelets of 77k.  He was noted to have an elevated sedimentation rate of 70, with an SPEP showing an M spike of 1.4 g/dL IgM level of 1917 with immunofixation electrophoresis showing an IgM monoclonal protein with kappa Light Chain Specificity. Serum free light chains showed significantly elevated kappa light chains. Peripheral blood flow cytometry showed a minor CD5 positive lymphocyte population which was CD23 negative.  Patient subsequently had a bone marrow biopsy on 05/06/2016. Final pathology results from his bone marrow reveal a low grade NHL. He is here today to start Rituxan. He met with Dr. Irene Keith last week to discuss. He presents today with his wife and daughter. He has a few questions.   He notes that his abdomen is better. No further episodes of diarrhea. He has a follow-up appointment for his recent diverticulitis with Dr. Ladona Keith in [place]. He denies any blood in his stool. He is still eating the diet recommended to him at discharge. He just had repeat CT imaging of the abdomen.   MEDICAL HISTORY:    Past Medical History  Diagnosis Date  . Anemia 04/22/2016  . Pancytopenia ([place]) 04/22/2016  . Diverticulosis     pt reports recent admission for diverticulosis perforation this past week  BPH L4-L5 fracture 6 weeks ago Vasomotor rhinitis for several years.  SURGICAL HISTORY: Past Surgical History  Procedure Laterality Date  . Cataract extraction Bilateral   . Tonsillectomy      SOCIAL HISTORY: Social History   Social History  . Marital Status: Married    Spouse Name: N/A  . Number of Children: N/A  . Years of Education: N/A   Occupational History  . Not on file.   Social History Main Topics  . Smoking status: Former Smoker    Quit date: 09/15/1962  . Smokeless tobacco: Never Used  . Alcohol Use: Yes     Comment: OCCASSIONAL  . Drug Use: No  . Sexual Activity: Not on file   Other Topics Concern  . Not on file   Social History Narrative    FAMILY HISTORY: No family history on file.  ALLERGIES:  has No Known Allergies.  MEDICATIONS:  Current Outpatient Prescriptions  Medication Sig Dispense Refill  . amoxicillin-clavulanate (AUGMENTIN) 875-125 MG tablet Take 1 tablet by mouth 2 (two) times daily. Pt reports recently started augmentin d/t diverticulosis perforation treatment    . B Complex-C (SUPER B COMPLEX PO) Take by mouth. Pt reports unsure of dosage, takes one tablet per day    . cyanocobalamin 100 MCG tablet Take by mouth daily. Pt reports unsure of dosage, takes once daily    . furosemide (LASIX) 20 MG tablet  Take 20 mg by mouth daily.    . RiTUXimab (RITUXAN IV) Inject into the vein.    . tamsulosin (FLOMAX) 0.4 MG CAPS capsule Take 0.4 mg by mouth.     No current facility-administered medications for this visit.    REVIEW OF SYSTEMS:   14 point review of systems was performed and is negative except as detailed under history of present illness and above   PHYSICAL EXAMINATION: ECOG PERFORMANCE STATUS: 1 - Symptomatic but completely  ambulatory  Vitals - 1 value per visit 2/54/2706  SYSTOLIC 237  DIASTOLIC 66  Pulse 628  Temperature 97.5  Respirations 18  Weight (lb) 162.6  Height   BMI 22.05    GENERAL:alert, in no acute distress and comfortable SKIN: skin color, texture, turgor are normal, no rashes or significant lesions EYES: normal, conjunctiva are pink and non-injected, sclera clear OROPHARYNX:no exudate, no erythema and lips, buccal mucosa, and tongue normal  NECK: supple, no JVD, thyroid normal size, non-tender, without nodularity LYMPH:  no palpable lymphadenopathy in the cervical, axillary or inguinal LUNGS: clear to auscultation with normal respiratory effort HEART: regular rate & rhythm,  no murmurs and no lower extremity edema ABDOMEN: abdomen soft, non-tender, normoactive bowel sounds no overt palpable hepatosplenomegaly Musculoskeletal: no cyanosis of digits and no clubbing  PSYCH: alert & oriented x 3 with fluent speech NEURO: no focal motor/sensory deficits  LABORATORY DATA:  I have reviewed the data as listed  Results for Paul, Keith (MRN [id number])   Ref. Range 04/23/2016 10:05 05/07/2016 12:23 05/21/2016 17:30 06/03/2016 08:20  NEUT# Latest Ref Range: 1.7 - 7.7 K/uL 1.0 (L) 1.3 (L) 1.8 1.4 (L)   Results for Paul, Keith (MRN [id number])   Ref. Range 06/03/2016 08:20  Sodium Latest Ref Range: 135 - 145 mmol/L 133 (L)  Potassium Latest Ref Range: 3.5 - 5.1 mmol/L 3.8  Chloride Latest Ref Range: 101 - 111 mmol/L 103  CO2 Latest Ref Range: 22 - 32 mmol/L 23  BUN Latest Ref Range: 6 - 20 mg/dL 22 (H)  Creatinine Latest Ref Range: 0.61 - 1.24 mg/dL 1.35 (H)  Calcium Latest Ref Range: 8.9 - 10.3 mg/dL 8.5 (L)  EGFR (Non-African Amer.) Latest Ref Range: >60 mL/min 47 (L)  EGFR (African American) Latest Ref Range: >60 mL/min 54 (L)  Glucose Latest Ref Range: 65 - 99 mg/dL 128 (H)  Anion gap Latest Ref Range: 5 - 15  7  Alkaline Phosphatase Latest Ref Range: 38 - 126 U/L 85  Albumin  Latest Ref Range: 3.5 - 5.0 g/dL 3.2 (L)  AST Latest Ref Range: 15 - 41 U/L 19  ALT Latest Ref Range: 17 - 63 U/L 14 (L)  Total Protein Latest Ref Range: 6.5 - 8.1 g/dL 7.7  Total Bilirubin Latest Ref Range: 0.3 - 1.2 mg/dL 0.7  Total Protein ELP Latest Ref Range: 6.0 - 8.5 g/dL 7.5  Albumin SerPl Elph-Mcnc Latest Ref Range: 2.9 - 4.4 g/dL 3.1  Albumin/Glob SerPl Latest Ref Range: 0.7 - 1.7  0.8  Alpha2 Glob SerPl Elph-Mcnc Latest Ref Range: 0.4 - 1.0 g/dL 0.5  Alpha 1 Latest Ref Range: 0.0 - 0.4 g/dL 0.3  Gamma Glob SerPl Elph-Mcnc Latest Ref Range: 0.4 - 1.8 g/dL 2.7 (H)  IFE 1 Unknown Comment  Globulin, Total Latest Ref Range: 2.2 - 3.9 g/dL 4.4 (H)  B-Globulin SerPl Elph-Mcnc Latest Ref Range: 0.7 - 1.3 g/dL 0.8  IgG (Immunoglobin G), Serum Latest Ref Range: 700 - 1,600 mg/dL 654 (L)  IgA Latest  Ref Range: 61 - 437 mg/dL 173  IgM, Serum Latest Ref Range: 15 - 143 mg/dL 2,866 (H)  M Protein SerPl Elph-Mcnc Latest Ref Range: Not Observed g/dL 2.1 (H)  WBC Latest Ref Range: 4.0 - 10.5 K/uL 2.7 (L)  RBC Latest Ref Range: 4.22 - 5.81 MIL/uL 3.75 (L)  Hemoglobin Latest Ref Range: 13.0 - 17.0 g/dL 10.3 (L)  HCT Latest Ref Range: 39.0 - 52.0 % 31.7 (L)  MCV Latest Ref Range: 78.0 - 100.0 fL 84.5  MCH Latest Ref Range: 26.0 - 34.0 pg 27.5  MCHC Latest Ref Range: 30.0 - 36.0 g/dL 32.5  RDW Latest Ref Range: 11.5 - 15.5 % 15.1  Platelets Latest Ref Range: 150 - 400 K/uL 83 (L)  Neutrophils Latest Units: % 52  Lymphocytes Latest Units: % 36  Monocytes Relative Latest Units: % 7  Eosinophil Latest Units: % 4  Basophil Latest Units: % 0  NEUT# Latest Ref Range: 1.7 - 7.7 K/uL 1.4 (L)  Lymphocyte # Latest Ref Range: 0.7 - 4.0 K/uL 1.0  Monocyte # Latest Ref Range: 0.1 - 1.0 K/uL 0.2  Eosinophils Absolute Latest Ref Range: 0.0 - 0.7 K/uL 0.1  Basophils Absolute Latest Ref Range: 0.0 - 0.1 K/uL 0.0   . CBC Latest Ref Rng 05/21/2016 05/07/2016 05/06/2016  WBC 4.0 - 10.5 K/uL 2.8(L) 2.6(L)  2.3(L)  Hemoglobin 13.0 - 17.0 g/dL 10.2(L) 8.6(L) 8.2(L)  Hematocrit 39.0 - 52.0 % 30.8(L) 26.3(L) 25.3(L)  Platelets 150 - 400 K/uL 125(L) 83(L) 85(L)    . CMP Latest Ref Rng 05/21/2016 05/14/2016 05/07/2016  Glucose 65 - 99 mg/dL 101(H) 120(H) 96  BUN 6 - 20 mg/dL 17 27(H) 24(H)  Creatinine 0.61 - 1.24 mg/dL 1.01 1.42(H) 1.42(H)  Sodium 135 - 145 mmol/L 135 134(L) 135  Potassium 3.5 - 5.1 mmol/L 3.6 4.0 4.4  Chloride 101 - 111 mmol/L 105 105 104  CO2 22 - 32 mmol/L '26 23 26  ' Calcium 8.9 - 10.3 mg/dL 8.0(L) 8.4(L) 8.7(L)  Total Protein 6.5 - 8.1 g/dL 6.6 7.0 7.1  Total Bilirubin 0.3 - 1.2 mg/dL 0.7 0.6 0.7  Alkaline Phos 38 - 126 U/L 88 82 85  AST 15 - 41 U/L '25 19 16  ' ALT 17 - 63 U/L 19 17 12(L)   Component     Latest Ref Rng 04/23/2016 04/23/2016 05/04/2016 05/04/2016        10:05 AM 10:25 AM  5:00 PM  5:05 PM  Total Protein ELP     6.0 - 8.5 g/dL 7.0 7.1    Albumin ELP     2.9 - 4.4 g/dL 3.4     Alpha-1-Globulin     0.0 - 0.4 g/dL 0.3     Alpha-2-Globulin     0.4 - 1.0 g/dL 0.5     Beta Globulin     0.7 - 1.3 g/dL 0.8     Gamma Globulin     0.4 - 1.8 g/dL 2.1 (H)     M-SPIKE, %     Not Observed g/dL 1.4 (H)     SPE Interp.      Comment     Comment      Comment     Globulin, Total     2.2 - 3.9 g/dL 3.6     A/G Ratio     0.7 - 1.7 0.9     IgG (Immunoglobin G), Serum     700 - 1600 mg/dL 733 723    IgA  61 - 437 mg/dL 198 191    IgM, Serum     15 - 143 mg/dL 1840 (H) 1917 (H)    Immunofixation Result, Serum       Comment    Iron     45 - 182 ug/dL 28 (L)     TIBC     250 - 450 ug/dL 223 (L)     Saturation Ratios     17.9 - 39.5 % 13 (L)     UIBC      195     Retic Ct Pct     0.4 - 3.1 % 1.7     RBC.     4.22 - 5.81 MIL/uL 3.13 (L)     Retic Count, Manual     19.0 - 186.0 K/uL 53.2     Kappa free light chain     3.3 - 19.4 mg/L 189.8 (H)     Lamda free light chains     5.7 - 26.3 mg/L 17.2     Kappa, lamda light chain ratio     0.26 - 1.65 11.03  (H)     Vitamin B12     180 - 914 pg/mL 241     Folate     >5.9 ng/mL 36.5     Ferritin     24 - 336 ng/mL 77     Erythropoietin     2.6 - 18.5 mIU/mL 62.3 (H)     LDH     98 - 192 U/L 130     CRP     <1.0 mg/dL 1.8 (H)     Haptoglobin     34 - 200 mg/dL 40     Sed Rate     0 - 16 mm/hr 70 (H)     Fecal Occult Blood, POC     NEGATIVE   NEGATIVE NEGATIVE  Immunofixation electrophoresis      The value has a corrected status.      No reference range information available      Comments:           (NOTE)           Immunofixation shows IgM monoclonal protein with kappa light           chain           specificity.          RADIOGRAPHIC STUDIES: I have personally reviewed the radiological images as listed and agreed with the findings in the report. Ct Abdomen Pelvis Wo Contrast  06/01/2016  CLINICAL DATA:  82 year old male with perforation of the tests and several weeks ago. Chronic lymphocytic leukemia. Anemia. EXAM: CT ABDOMEN AND PELVIS WITHOUT CONTRAST TECHNIQUE: Multidetector CT imaging of the abdomen and pelvis was performed following the standard protocol without IV contrast. COMPARISON:  05/16/2016 CT.  05/13/2016 PET-CT. FINDINGS: Lower chest:  Negative. Hepatobiliary: Unenhanced imaging without abnormality. Pancreas: Unenhanced imaging without abnormality. Spleen: Marked splenomegaly once again noted. Adrenals/Urinary Tract: Unenhanced imaging without abnormality noted. Stomach/Bowel: Circumferential diffuse thickening of the mid to distal sigmoid colon which contains small number of diverticula. Adjacent small amount of fluid remains. Amount of fluid has decreased since prior examination with interval clearing of previously noted small amount of extraluminal air. It is possible that this represents diverticulitis which has improved but incompletely cleared. Underlying mass whether related to primary malignancy or lymphoma of the sigmoid colon not excluded given the fact that  this region was hypermetabolic on recent PET-CT.  Shifting fluid within the upper quadrant adjacent to the duodenum/jejunum. No primary small bowel abnormality detected as a source of this fluid which may represent fluid from the pelvic inflammatory process shifting superiorly. Under distended stomach with limited evaluation without gross abnormality. Vascular/Lymphatic: Calcified plaque aorta and iliac arteries without aneurysmal dilation. No adenopathy detected. Reproductive: Enlarged prostate gland. Other: As above. Musculoskeletal: Schmorl's node deformity superior endplate L4. Schmorl's node deformity/superior endplate compression fracture L5. Findings unchanged. Questionable diffuse osseous involvement by lymphoma (noted on PET-CT) not appreciated by CT. IMPRESSION: Circumferential diffuse thickening of the mid to distal sigmoid colon. Adjacent small amount of fluid has decreased since prior examination with interval clearing of previously noted small amount of extraluminal air. It is possible that this represents diverticulitis which has improved but incompletely cleared. Underlying mass whether related to primary malignancy or lymphoma of the sigmoid colon not excluded given the fact that this region was hypermetabolic on recent PET-CT. Shifting fluid within the upper quadrant adjacent to the duodenum/jejunum. No primary small bowel abnormality detected as a source of this fluid which may represent fluid from the pelvic inflammatory process shifting superiorly. Marked splenomegaly once again noted. Enlarged prostate gland. Aortic atherosclerosis. Electronically Signed   By: Genia Del M.D.   On: 06/01/2016 09:38   Nm Pet Image Initial (pi) Skull Base To Thigh  05/13/2016  CLINICAL DATA:  Initial treatment strategy for lymphoma. EXAM: NUCLEAR MEDICINE PET SKULL BASE TO THIGH TECHNIQUE: 9.4 mCi F-18 FDG was injected intravenously. Full-ring PET imaging was performed from the skull base to thigh after the  radiotracer. CT data was obtained and used for attenuation correction and anatomic localization. FASTING BLOOD GLUCOSE:  Value: 102 mg/dl COMPARISON:  None. FINDINGS: NECK No hypermetabolic lymph nodes in the neck. CHEST No hypermetabolic mediastinal or hilar nodes. No suspicious pulmonary nodules on the CT scan. ABDOMEN/PELVIS No abnormal hypermetabolic activity within the liver, pancreas, adrenal glands, or spleen. No hypermetabolic lymph nodes in the abdomen or pelvis. Splenomegaly. The spleen measures 17.5 x 17.0 x 12 cm. Mild diffuse hypermetabolism when compared to the liver. SKELETON Diffuse uptake in the bony structures likely due tp diffuse marrow disease, narrowing or marrow stimulating drugs. IMPRESSION: Splenomegaly and mild diffuse increase metabolic activity in the spleen consistent with lymphomatous involvement. Diffuse osseous uptake likely due to diffuse lymphoma. Recommend correlation with any marrow stimulating drugs or rebound phenomenon. No enlarged or hypermetabolic lymph nodes in the neck, chest, abdomen or pelvis. Electronically Signed   By: Marijo Sanes M.D.   On: 05/13/2016 14:51   Ct Biopsy  05/06/2016  INDICATION: 82 year old with pancytopenia. EXAM: CT GUIDED BONE MARROW ASPIRATES AND BIOPSY Physician: Stephan Minister. Anselm Pancoast, MD MEDICATIONS: None. ANESTHESIA/SEDATION: Fentanyl 75 mcg IV; Versed 3.0 mg IV Moderate Sedation Time:  13 minutes The patient was continuously monitored during the procedure by the interventional radiology nurse under my direct supervision. COMPLICATIONS: None immediate. PROCEDURE: The procedure was explained to the patient. The risks and benefits of the procedure were discussed and the patient's questions were addressed. Informed consent was obtained from the patient. The patient was placed prone on CT scan. Images of the pelvis were obtained. The right side of back was prepped and draped in sterile fashion. The skin and right posterior iliac bone were anesthetized  with 1% lidocaine. 11 gauge bone needle was directed into the right iliac bone with CT guidance. Three aspirates and two core biopsies obtained. Bandage placed over the puncture site. FINDINGS: Bone needle directed into the posterior  right ilium. IMPRESSION: CT guided bone marrow aspirates and core biopsy. Electronically Signed   By: Markus Daft M.D.   On: 05/06/2016 12:00    ASSESSMENT & PLAN:  NHL, low grade, heavy marrow involvement Pancytopenia with neutropenia IgM Kappa monoclonal gammopathy L4-L5 vertebral fracture Splenomegaly secondary to NHL Diffuse osseous uptake on PET secondary to NHL Recent diverticulitis  Difficult situation given his low white count from his NHL and his diverticular disease. Discussed with the patient and his family. I have recommended proceeding with rituxan. Not usually a drug we associate with neutropenia. He has a pre-existing neutropenia from his marrow involvement. At this point I would extend his antibiotics and have called in Cipro/flagyl. I advised the patient to keep close follow-up with Dr. Ladona Keith and at any signs/symptoms of abdominal discomfort to present to the ED. The patient understands.  We discussed NHL in detail.  At some point, we may need to consider adding bendamustine. I am concerned that given his heavy marrow involvement and splenomegaly this will be needed but clearly not until his bowel issues are fully resolved.   L4-L5 recent fracture with no significant trauma . Concern for pathologic fracture . ? Lymphomatous involvement of the spine Given PET results most likely the case. Patient has currently no concerning neurological symptoms are uncontrolled back pain at this time.  RTC 1 week.  All of the patients and his family's questions were answered in details to their apparent satisfaction. The patient knows to call the clinic with any problems, questions or concerns.  This document serves as a record of services personally performed by  Ancil Linsey, MD. It was created on her behalf by Arlyce Harman, a trained medical scribe. The creation of this record is based on the scribe's personal observations and the provider's statements to them. This document has been checked and approved by the attending provider.  I have reviewed the above documentation for accuracy and completeness, and I agree with the above.  Kelby Fam. Penland, MD  06/02/2016 1:25 PM

## 2016-06-03 ENCOUNTER — Encounter (HOSPITAL_BASED_OUTPATIENT_CLINIC_OR_DEPARTMENT_OTHER): Payer: Medicare Other

## 2016-06-03 ENCOUNTER — Encounter: Payer: Self-pay | Admitting: Dietician

## 2016-06-03 ENCOUNTER — Encounter (HOSPITAL_COMMUNITY): Payer: Medicare Other | Attending: Hematology & Oncology | Admitting: Hematology & Oncology

## 2016-06-03 ENCOUNTER — Encounter (HOSPITAL_COMMUNITY): Payer: Medicare Other

## 2016-06-03 VITALS — BP 127/66 | HR 101 | Temp 97.5°F | Resp 18 | Wt 162.6 lb

## 2016-06-03 VITALS — BP 116/55 | HR 79 | Temp 98.1°F | Resp 18

## 2016-06-03 DIAGNOSIS — D61818 Other pancytopenia: Secondary | ICD-10-CM

## 2016-06-03 DIAGNOSIS — Z5112 Encounter for antineoplastic immunotherapy: Secondary | ICD-10-CM

## 2016-06-03 DIAGNOSIS — K5780 Diverticulitis of intestine, part unspecified, with perforation and abscess without bleeding: Secondary | ICD-10-CM

## 2016-06-03 DIAGNOSIS — D81818 Other biotin-dependent carboxylase deficiency: Secondary | ICD-10-CM

## 2016-06-03 DIAGNOSIS — C8300 Small cell B-cell lymphoma, unspecified site: Secondary | ICD-10-CM

## 2016-06-03 DIAGNOSIS — D709 Neutropenia, unspecified: Secondary | ICD-10-CM | POA: Diagnosis not present

## 2016-06-03 DIAGNOSIS — C8519 Unspecified B-cell lymphoma, extranodal and solid organ sites: Secondary | ICD-10-CM | POA: Diagnosis not present

## 2016-06-03 DIAGNOSIS — D472 Monoclonal gammopathy: Secondary | ICD-10-CM

## 2016-06-03 DIAGNOSIS — C8598 Non-Hodgkin lymphoma, unspecified, lymph nodes of multiple sites: Secondary | ICD-10-CM

## 2016-06-03 DIAGNOSIS — K5790 Diverticulosis of intestine, part unspecified, without perforation or abscess without bleeding: Secondary | ICD-10-CM

## 2016-06-03 DIAGNOSIS — R161 Splenomegaly, not elsewhere classified: Secondary | ICD-10-CM

## 2016-06-03 DIAGNOSIS — C880 Waldenstrom macroglobulinemia: Secondary | ICD-10-CM

## 2016-06-03 DIAGNOSIS — Z87891 Personal history of nicotine dependence: Secondary | ICD-10-CM

## 2016-06-03 LAB — COMPREHENSIVE METABOLIC PANEL
ALBUMIN: 3.2 g/dL — AB (ref 3.5–5.0)
ALK PHOS: 85 U/L (ref 38–126)
ALT: 14 U/L — AB (ref 17–63)
ANION GAP: 7 (ref 5–15)
AST: 19 U/L (ref 15–41)
BUN: 22 mg/dL — ABNORMAL HIGH (ref 6–20)
CALCIUM: 8.5 mg/dL — AB (ref 8.9–10.3)
CO2: 23 mmol/L (ref 22–32)
Chloride: 103 mmol/L (ref 101–111)
Creatinine, Ser: 1.35 mg/dL — ABNORMAL HIGH (ref 0.61–1.24)
GFR calc Af Amer: 54 mL/min — ABNORMAL LOW (ref >60)
GFR calc non Af Amer: 47 mL/min — ABNORMAL LOW (ref >60)
GLUCOSE: 128 mg/dL — AB (ref 65–99)
Potassium: 3.8 mmol/L (ref 3.5–5.1)
SODIUM: 133 mmol/L — AB (ref 135–145)
Total Bilirubin: 0.7 mg/dL (ref 0.3–1.2)
Total Protein: 7.7 g/dL (ref 6.5–8.1)

## 2016-06-03 LAB — CBC WITH DIFFERENTIAL/PLATELET
BASOS PCT: 0 %
Basophils Absolute: 0 10*3/uL (ref 0.0–0.1)
EOS ABS: 0.1 10*3/uL (ref 0.0–0.7)
Eosinophils Relative: 4 %
HCT: 31.7 % — ABNORMAL LOW (ref 39.0–52.0)
HEMOGLOBIN: 10.3 g/dL — AB (ref 13.0–17.0)
LYMPHS PCT: 36 %
Lymphs Abs: 1 10*3/uL (ref 0.7–4.0)
MCH: 27.5 pg (ref 26.0–34.0)
MCHC: 32.5 g/dL (ref 30.0–36.0)
MCV: 84.5 fL (ref 78.0–100.0)
Monocytes Absolute: 0.2 10*3/uL (ref 0.1–1.0)
Monocytes Relative: 7 %
NEUTROS ABS: 1.4 10*3/uL — AB (ref 1.7–7.7)
NEUTROS PCT: 52 %
Platelets: 83 10*3/uL — ABNORMAL LOW (ref 150–400)
RBC: 3.75 MIL/uL — AB (ref 4.22–5.81)
RDW: 15.1 % (ref 11.5–15.5)
WBC: 2.7 10*3/uL — AB (ref 4.0–10.5)

## 2016-06-03 MED ORDER — CIPROFLOXACIN HCL 500 MG PO TABS: 500.0000 mg | ORAL_TABLET | Freq: Two times a day (BID) | ORAL | Status: DC

## 2016-06-03 MED ORDER — SODIUM CHLORIDE 0.9% FLUSH: 10.0000 mL | INTRAVENOUS | Status: DC | PRN

## 2016-06-03 MED ORDER — SODIUM CHLORIDE 0.9 % IV SOLN
375.0000 mg/m2 | Freq: Once | INTRAVENOUS | Status: AC
  Administered 2016-06-03: 800 mg via INTRAVENOUS
  Filled 2016-06-03: qty 80

## 2016-06-03 MED ORDER — DIPHENHYDRAMINE HCL 25 MG PO CAPS
ORAL_CAPSULE | ORAL | Status: AC
  Filled 2016-06-03: qty 2

## 2016-06-03 MED ORDER — DIPHENHYDRAMINE HCL 25 MG PO CAPS
50.0000 mg | ORAL_CAPSULE | Freq: Once | ORAL | Status: AC
  Administered 2016-06-03: 50 mg via ORAL

## 2016-06-03 MED ORDER — HEPARIN SOD (PORK) LOCK FLUSH 100 UNIT/ML IV SOLN: 500.0000 [IU] | Freq: Once | INTRAVENOUS | Status: DC | PRN

## 2016-06-03 MED ORDER — SODIUM CHLORIDE 0.9 % IV SOLN
Freq: Once | INTRAVENOUS | Status: AC
  Administered 2016-06-03: 10:00:00 via INTRAVENOUS

## 2016-06-03 MED ORDER — SODIUM CHLORIDE 0.9 % IV SOLN
20.0000 mg | Freq: Once | INTRAVENOUS | Status: AC
  Administered 2016-06-03: 20 mg via INTRAVENOUS
  Filled 2016-06-03: qty 2

## 2016-06-03 MED ORDER — METRONIDAZOLE 500 MG PO TABS: 500.0000 mg | ORAL_TABLET | Freq: Three times a day (TID) | ORAL | Status: DC

## 2016-06-03 MED ORDER — ACETAMINOPHEN 325 MG PO TABS
650.0000 mg | ORAL_TABLET | Freq: Once | ORAL | Status: AC
  Administered 2016-06-03: 650 mg via ORAL

## 2016-06-03 MED ORDER — ACETAMINOPHEN 325 MG PO TABS
ORAL_TABLET | ORAL | Status: AC
  Filled 2016-06-03: qty 2

## 2016-06-03 NOTE — Progress Notes (Signed)
RD asked by MD to educated pt on GI soft diet as he had been told to follow one, but had no education on what that was  Contacted Pt by visiting pt and family during infusion   Wt Readings from Last 10 Encounters:  06/03/16 162 lb 9.6 oz (73.755 kg)  05/21/16 179 lb 3.2 oz (81.285 kg)  05/07/16 172 lb 11.2 oz (78.336 kg)  05/06/16 168 lb 3.2 oz (76.295 kg)   Patient has lost ~ 6 lbs in the last month  RD provided handout "Low fiber Diet" as well as an additional handout from an OSH with a concise well made table with same information.  Discussed with pt and family what fiber is and how it is not fully digested and can add bulk to stool.   RD went over what foods are to be avoided. Until cleared by MD he is instructed to:  -avoid all peels/stems from his fruits and vegetables. He should peel his potatoes. Reccommended soft canned fruit or mashed potatoes. His vegetables should be well cooked and "fork tender", meaning can easily be cut with side of fork. His vegetables should not be crisp or make a crunching sound.   -avoid tough meats or meats with casings such as hotdogs/sausage. Reccommended items such as meatloaf, pulled pork, ground hamburger.    -be cautious with gas forming foods such as cabbage, legumes and milk. He regularly consumes milk and does not note this causing any excess gas.   - not consume any nuts or seeds. He asked about seeds from green beans and PB. Green bean seeds are not reccommended. Smooth PB is ok.    -avoid any whole grains. His bread, pasta, and rice should all be white/processed/refined grains. He is to not eat any jams, spices, breads, deserts or cereals with nuts or seeds in them  -not to eat corn or peas due to the outer fibrous shell.   As a general rule, he is to read labels and to avoid foods with >1 gram fiber, though noted that in case of certain items, such as a potato, the fiber can be easily removed by peeling the item .  Explained he can  drastically reduce the change of any complications by simply taking extra time to chew his food. All digestion starts in the mouth and if he chews everything very well before swallowing, the work the rest of the body has to do is dramatically reduced.   Pt and family both seemed to fully understand all recommendations. Do not foresee any trouble following this diet. Expect good compliance  Burtis Junes RD, LDN, [facility] Pager: [phone] 06/03/2016 5:05 PM

## 2016-06-03 NOTE — Patient Instructions (Signed)
[facility] Discharge Instructions for Patients Receiving Chemotherapy   Beginning January 23rd 2017 lab work for the Cancer Center will be done in the  Main lab at [facility] on 1st floor. If you have a lab appointment with the Cancer Center please come in thru the  Main Entrance and check in at the main information desk   Today you received the following chemotherapy agent: Rituxan.     If you develop nausea and vomiting, or diarrhea that is not controlled by your medication, call the clinic.  The clinic phone number is [phone]. Office hours are Monday-Friday 8:30am-5:00pm.  BELOW ARE SYMPTOMS THAT SHOULD BE REPORTED IMMEDIATELY:  *FEVER GREATER THAN 101.0 F  *CHILLS WITH OR WITHOUT FEVER  NAUSEA AND VOMITING THAT IS NOT CONTROLLED WITH YOUR NAUSEA MEDICATION  *UNUSUAL SHORTNESS OF BREATH  *UNUSUAL BRUISING OR BLEEDING  TENDERNESS IN MOUTH AND THROAT WITH OR WITHOUT PRESENCE OF ULCERS  *URINARY PROBLEMS  *BOWEL PROBLEMS  UNUSUAL RASH Items with * indicate a potential emergency and should be followed up as soon as possible. If you have an emergency after office hours please contact your primary care physician or go to the nearest emergency department.  Please call the clinic during office hours if you have any questions or concerns.   You may also contact the Patient Navigator at [phone] should you have any questions or need assistance in obtaining follow up care.      Resources For Cancer Patients and their Caregivers ? American Cancer Society: Can assist with transportation, wigs, general needs, runs Look Good Feel Better.        1-888-227-6333 ? Cancer Care: Provides financial assistance, online support groups, medication/co-pay assistance.  1-800-813-HOPE (4673) ? [facility] Assists [place] cancer patients and their families through emotional , educational and financial support.   336-427-4357 ? [place] DSS Where to apply for food stamps, Medicaid and utility assistance. 336-342-1394 ? [facility]: Transportation to medical appointments. 336-347-2287 ? Social Security Administration: May apply for disability if have a Stage IV cancer. 336-342-7796 1-800-772-1213 ? [place] [facility]ging, Disability and Transit Services: Assists with nutrition, care and transit needs. 336-349-2343          

## 2016-06-03 NOTE — Patient Instructions (Signed)
[place] at [facility] Discharge Instructions  RECOMMENDATIONS MADE BY THE CONSULTANT AND ANY TEST RESULTS WILL BE SENT TO YOUR REFERRING PHYSICIAN.  Exam done and seen today by Dr. Whitney Muse  Return to see the doctor in Please call the clinic if you have any questions or concerns  Thank you for choosing [place] at [facility] to provide your oncology and hematology care.  To afford each patient quality time with our provider, please arrive at least 15 minutes before your scheduled appointment time.   Beginning January 23rd 2017 lab work for the [name] will be done in the  Main lab at [facility] on 1st floor. If you have a lab appointment with the [facility] please come in thru the  Main Entrance and check in at the main information desk  You need to re-schedule your appointment should you arrive 10 or more minutes late.  We strive to give you quality time with our providers, and arriving late affects you and other patients whose appointments are after yours.  Also, if you no show three or more times for appointments you may be dismissed from the clinic at the providers discretion.     Again, thank you for choosing [facility].  Our hope is that these requests will decrease the amount of time that you wait before being seen by our physicians.       _____________________________________________________________  Should you have questions after your visit to [facility], please contact our office at [phone] between the hours of 8:30 a.m. and 4:30 p.m.  Voicemails left after 4:30 p.m. will not be returned until the following business day.  For prescription refill requests, have your pharmacy contact our office.         Resources For Cancer Patients and their Caregivers ? American Cancer Society: Can assist with transportation, wigs, general needs, runs Look Good Feel Better.         ([phone] ? Cancer Care: Provides financial assistance, online support groups, medication/co-pay assistance.  1-800-813-HOPE 248-463-2664) ? [place] Assists [place] cancer patients and their families through emotional , educational and financial support.  215-684-8795 ? [place] DSS Where to apply for food stamps, Medicaid and utility assistance. 209-625-2668 ? RCATS: Transportation to medical appointments. 5673886789 ? Social Security Administration: May apply for disability if have a Stage IV cancer. 4426668648 312-369-6769 ? [employer], Disability and Transit Services: Assists with nutrition, care and transit needs. [place] Support Programs: @10RELATIVEDAYS @ > Cancer Support Group  2nd Tuesday of the month 1pm-2pm, Journey Room  > Creative Journey  3rd Tuesday of the month 1130am-1pm, Journey Room  > Look Good Feel Better  1st Wednesday of the month 10am-12 noon, Journey Room (Call [place] to register [phone])

## 2016-06-03 NOTE — Progress Notes (Signed)
Patient tolerated infusion well.  VSS throughout.  Patient ambulatory out of the clinic.

## 2016-06-04 ENCOUNTER — Encounter (HOSPITAL_COMMUNITY): Payer: Self-pay | Admitting: Hematology & Oncology

## 2016-06-04 ENCOUNTER — Telehealth (HOSPITAL_COMMUNITY): Payer: Self-pay | Admitting: *Deleted

## 2016-06-04 NOTE — Telephone Encounter (Signed)
Spoke with patient. Denies any complaints post chemo. Reports an episode of "staggering" after getting up quickly but also states that this happens to him occasionally. Instructed patient on orthostatic changes and necessary precautions to prevent falls.

## 2016-06-08 LAB — MULTIPLE MYELOMA PANEL, SERUM
ALBUMIN SERPL ELPH-MCNC: 3.1 g/dL (ref 2.9–4.4)
ALPHA 1: 0.3 g/dL (ref 0.0–0.4)
ALPHA2 GLOB SERPL ELPH-MCNC: 0.5 g/dL (ref 0.4–1.0)
Albumin/Glob SerPl: 0.8 (ref 0.7–1.7)
B-GLOBULIN SERPL ELPH-MCNC: 0.8 g/dL (ref 0.7–1.3)
Gamma Glob SerPl Elph-Mcnc: 2.7 g/dL — ABNORMAL HIGH (ref 0.4–1.8)
Globulin, Total: 4.4 g/dL — ABNORMAL HIGH (ref 2.2–3.9)
IGG (IMMUNOGLOBIN G), SERUM: 654 mg/dL — AB (ref 700–1600)
IgA: 173 mg/dL (ref 61–437)
IgM, Serum: 2866 mg/dL — ABNORMAL HIGH (ref 15–143)
M PROTEIN SERPL ELPH-MCNC: 2.1 g/dL — AB
TOTAL PROTEIN ELP: 7.5 g/dL (ref 6.0–8.5)

## 2016-06-09 ENCOUNTER — Other Ambulatory Visit (HOSPITAL_COMMUNITY): Payer: Self-pay | Admitting: Emergency Medicine

## 2016-06-09 NOTE — Progress Notes (Signed)
Called pts wife to let her know of new changes to appt next week

## 2016-06-10 ENCOUNTER — Ambulatory Visit (HOSPITAL_COMMUNITY): Payer: Medicare Other

## 2016-06-10 ENCOUNTER — Ambulatory Visit (HOSPITAL_COMMUNITY): Payer: Medicare Other | Admitting: Hematology & Oncology

## 2016-06-17 ENCOUNTER — Ambulatory Visit (HOSPITAL_COMMUNITY): Payer: Medicare Other

## 2016-06-18 ENCOUNTER — Encounter (HOSPITAL_COMMUNITY): Payer: Medicare Other | Attending: Hematology & Oncology | Admitting: Hematology & Oncology

## 2016-06-18 ENCOUNTER — Encounter (HOSPITAL_COMMUNITY): Payer: Self-pay | Admitting: Hematology & Oncology

## 2016-06-18 ENCOUNTER — Encounter (HOSPITAL_BASED_OUTPATIENT_CLINIC_OR_DEPARTMENT_OTHER): Payer: Medicare Other

## 2016-06-18 VITALS — BP 103/50 | HR 85 | Temp 97.9°F | Resp 18 | Wt 156.0 lb

## 2016-06-18 DIAGNOSIS — C8597 Non-Hodgkin lymphoma, unspecified, spleen: Secondary | ICD-10-CM | POA: Diagnosis not present

## 2016-06-18 DIAGNOSIS — Z9889 Other specified postprocedural states: Secondary | ICD-10-CM | POA: Insufficient documentation

## 2016-06-18 DIAGNOSIS — D649 Anemia, unspecified: Secondary | ICD-10-CM | POA: Insufficient documentation

## 2016-06-18 DIAGNOSIS — R161 Splenomegaly, not elsewhere classified: Secondary | ICD-10-CM | POA: Diagnosis not present

## 2016-06-18 DIAGNOSIS — D61818 Other pancytopenia: Secondary | ICD-10-CM

## 2016-06-18 DIAGNOSIS — Z79899 Other long term (current) drug therapy: Secondary | ICD-10-CM | POA: Insufficient documentation

## 2016-06-18 DIAGNOSIS — Z87891 Personal history of nicotine dependence: Secondary | ICD-10-CM | POA: Insufficient documentation

## 2016-06-18 DIAGNOSIS — D696 Thrombocytopenia, unspecified: Secondary | ICD-10-CM | POA: Insufficient documentation

## 2016-06-18 DIAGNOSIS — D472 Monoclonal gammopathy: Secondary | ICD-10-CM | POA: Diagnosis not present

## 2016-06-18 DIAGNOSIS — R634 Abnormal weight loss: Secondary | ICD-10-CM

## 2016-06-18 DIAGNOSIS — C8300 Small cell B-cell lymphoma, unspecified site: Secondary | ICD-10-CM

## 2016-06-18 DIAGNOSIS — K5790 Diverticulosis of intestine, part unspecified, without perforation or abscess without bleeding: Secondary | ICD-10-CM

## 2016-06-18 DIAGNOSIS — C8598 Non-Hodgkin lymphoma, unspecified, lymph nodes of multiple sites: Secondary | ICD-10-CM

## 2016-06-18 DIAGNOSIS — K5780 Diverticulitis of intestine, part unspecified, with perforation and abscess without bleeding: Secondary | ICD-10-CM

## 2016-06-18 DIAGNOSIS — R5383 Other fatigue: Secondary | ICD-10-CM | POA: Insufficient documentation

## 2016-06-18 DIAGNOSIS — C880 Waldenstrom macroglobulinemia: Secondary | ICD-10-CM

## 2016-06-18 MED ORDER — ACETAMINOPHEN 325 MG PO TABS
650.0000 mg | ORAL_TABLET | Freq: Once | ORAL | Status: AC
  Administered 2016-06-18: 650 mg via ORAL

## 2016-06-18 MED ORDER — SODIUM CHLORIDE 0.9 % IV SOLN
20.0000 mg | Freq: Once | INTRAVENOUS | Status: AC
  Administered 2016-06-18: 20 mg via INTRAVENOUS
  Filled 2016-06-18: qty 2

## 2016-06-18 MED ORDER — ACETAMINOPHEN 325 MG PO TABS
ORAL_TABLET | ORAL | Status: AC
  Filled 2016-06-18: qty 2

## 2016-06-18 MED ORDER — SODIUM CHLORIDE 0.9% FLUSH: 10.0000 mL | INTRAVENOUS | Status: DC | PRN

## 2016-06-18 MED ORDER — SODIUM CHLORIDE 0.9 % IV SOLN
Freq: Once | INTRAVENOUS | Status: AC
  Administered 2016-06-18: 11:00:00 via INTRAVENOUS

## 2016-06-18 MED ORDER — DIPHENHYDRAMINE HCL 25 MG PO CAPS
50.0000 mg | ORAL_CAPSULE | Freq: Once | ORAL | Status: AC
  Administered 2016-06-18: 50 mg via ORAL

## 2016-06-18 MED ORDER — DIPHENHYDRAMINE HCL 25 MG PO CAPS
ORAL_CAPSULE | ORAL | Status: AC
  Filled 2016-06-18: qty 2

## 2016-06-18 MED ORDER — SODIUM CHLORIDE 0.9 % IV SOLN
375.0000 mg/m2 | Freq: Once | INTRAVENOUS | Status: DC
  Filled 2016-06-18: qty 80

## 2016-06-18 MED ORDER — HEPARIN SOD (PORK) LOCK FLUSH 100 UNIT/ML IV SOLN
500.0000 [IU] | Freq: Once | INTRAVENOUS | Status: AC | PRN
  Administered 2016-06-18: 500 [IU]

## 2016-06-18 NOTE — Progress Notes (Signed)
Paul Keith Kitchen    HEMATOLOGY/ONCOLOGY CONSULTATION NOTE  Date of Service: 06/18/2016  Patient Care Team: Paul Graves, DO as PCP - General  CHIEF COMPLAINTS/PURPOSE OF CONSULTATION:  NHL Pancytopenia BMBX on 05/06/2016 with extensive involvement by NHL B cell, admixed plasma cell component to a lesser extent which also shows kappa light chain restriction. DDX lymphoplasmacytic lymphoma, marginal zone lymphoma, follicular lymphoma with plasma cell differentiation, splenic lymphoma. Low grade process is favored.  HISTORY OF PRESENTING ILLNESS:  Paul Keith is a wonderful 82 yPaulo. male who has been referred to [place] by Dr .Paul Graves, DO for evaluation and management of pancytopenia. Bone marrow involvement shows extensive involvement with NHL.  Paul Keith is accompanied by his wife and daughter. His daughter video recorded our visit today. He is here for Cycle #2 Rituximab. He presents in treatment bed.   Since our last visit, the patient was admitted to [facility] for diverticular abscess.  He is now on an IV antibiotic through next Wednesday.  He has not spoken with Dr. Ladona Keith about surgery. His wife believes they have an appointment next week with Dr. Ladona Keith.  He reads a lot throughout the day. He props his legs up. He tries to get around the house some.   He admits he has lost weight even when eating better. He has been eating eggs, peanut butter crackers, and cheese. He was eating Cheerios but found out he shouldn't be. He is on dietary restrictions. He continues to have to force himself to eat as food does not always taste good. He has been eating small frequent meals.   His wife reports his stool is still mostly liquid and he has irregular bowel movements. He has had two stool cultures done. Last night he was up about 5 times, but only 2 of those instances were going for a bowel movement.   He experienced foot swelling while receiving fluids.  His wife notes he seems to have been  feeling better over the last couple of days.  Currently no fever, night sweats.   MEDICAL HISTORY:  Past Medical History:  Diagnosis Date  . Anemia 04/22/2016  . Diverticulosis    pt reports recent admission for diverticulosis perforation this past week  . Pancytopenia ([place]) 04/22/2016  BPH L4-L5 fracture 6 weeks ago Vasomotor rhinitis for several years.  SURGICAL HISTORY: Past Surgical History:  Procedure Laterality Date  . CATARACT EXTRACTION Bilateral   . TONSILLECTOMY      SOCIAL HISTORY: Social History   Social History  . Marital status: Married    Spouse name: N/A  . Number of children: N/A  . Years of education: N/A   Occupational History  . Not on file.   Social History Main Topics  . Smoking status: Former Smoker    Quit date: 09/15/1962  . Smokeless tobacco: Never Used  . Alcohol use Yes     Comment: OCCASSIONAL  . Drug use: No  . Sexual activity: Not on file   Other Topics Concern  . Not on file   Social History Narrative  . No narrative on file    FAMILY HISTORY: History reviewed. No pertinent family history.  ALLERGIES:  has No Known Allergies.  MEDICATIONS:  Current Outpatient Prescriptions  Medication Sig Dispense Refill  . amoxicillin-clavulanate (AUGMENTIN) 875-125 MG tablet Take 1 tablet by mouth 2 (two) times daily. Pt reports recently started augmentin d/t diverticulosis perforation treatment    . B Complex-C (SUPER B COMPLEX PO) Take by mouth. Pt reports unsure  of dosage, takes one tablet per day    . ciprofloxacin (CIPRO) 500 MG tablet Take 1 tablet (500 mg total) by mouth 2 (two) times daily. 14 tablet 0  . cyanocobalamin 100 MCG tablet Take by mouth daily. Pt reports unsure of dosage, takes once daily    . furosemide (LASIX) 20 MG tablet Take 20 mg by mouth daily.    . metroNIDAZOLE (FLAGYL) 500 MG tablet Take 1 tablet (500 mg total) by mouth 3 (three) times daily. 21 tablet 0  . RiTUXimab (RITUXAN IV) Inject into the vein.    .  tamsulosin (FLOMAX) 0Paul4 MG CAPS capsule Take 0Paul4 mg by mouth.     No current facility-administered medications for this visit.    Facility-Administered Medications Ordered in Other Visits  Medication Dose Route Frequency Provider Last Rate Last Dose  . heparin lock flush 100 unit/mL  500 Units Intracatheter Once PRN Paul Genera, MD      . riTUXimab (RITUXAN) 800 mg in sodium chloride 0Paul9 % 250 mL (2Paul4242 mg/mL) chemo infusion  375 mg/m2 (Treatment Plan Recorded) Intravenous Once Paul Genera, MD      . sodium chloride flush (NS) 0Paul9 % injection 10 mL  10 mL Intracatheter PRN Paul Genera, MD        REVIEW OF SYSTEMS:   Positive for weight loss. 6 lbs weight loss since 06/03/16 Positive for diarrhea. Watery stool. Irregular bowel movements.  Positive for feet swelling. Associated with fluid intake. 14 point review of systems was performed and is negative except as detailed under history of present illness and above   PHYSICAL EXAMINATION: ECOG PERFORMANCE STATUS: 1 - Symptomatic but completely ambulatory  Vitals with BMI 06/18/2016  Height   Weight 156 lbs  BMI   Systolic 097  Diastolic 50  Pulse 85  Respirations 18    GENERAL:alert, in no acute distress and comfortable SKIN: skin color, texture, turgor are normal, no rashes or significant lesions EYES: normal, conjunctiva are pink and non-injected, sclera clear OROPHARYNX:no exudate, no erythema and lips, buccal mucosa, and tongue normal  NECK: supple, no JVD, thyroid normal size, non-tender, without nodularity LYMPH:  no palpable lymphadenopathy in the cervical, axillary or inguinal LUNGS: clear to auscultation with normal respiratory effort HEART: regular rate & rhythm,  no murmurs and no lower extremity edema ABDOMEN: abdomen soft, non-tender, normoactive bowel sounds no overt palpable hepatosplenomegaly Musculoskeletal: no cyanosis of digits and no clubbing  PSYCH: alert & oriented x 3 with fluent  speech NEURO: no focal motor/sensory deficits  LABORATORY DATA:  I have reviewed the data as listed  Results for Paul Keith (MRN [id number])   Ref. Range 06/03/2016 08:20  Sodium Latest Ref Range: 135 - 145 mmol/L 133 (L)  Potassium Latest Ref Range: 3Paul5 - 5Paul1 mmol/L 3Paul8  Chloride Latest Ref Range: 101 - 111 mmol/L 103  CO2 Latest Ref Range: 22 - 32 mmol/L 23  BUN Latest Ref Range: 6 - 20 mg/dL 22 (H)  Creatinine Latest Ref Range: 0Paul61 - 1Paul24 mg/dL 1Paul35 (H)  Calcium Latest Ref Range: 8Paul9 - 10Paul3 mg/dL 8Paul5 (L)  EGFR (Non-African Amer.) Latest Ref Range: >60 mL/min 47 (L)  EGFR (African American) Latest Ref Range: >60 mL/min 54 (L)  Glucose Latest Ref Range: 65 - 99 mg/dL 128 (H)  Anion gap Latest Ref Range: 5 - 15  7  Alkaline Phosphatase Latest Ref Range: 38 - 126 U/L 85  Albumin Latest Ref Range: 3Paul5 - 5Paul0 g/dL 3Paul2 (L)  AST Latest Ref Range: 15 - 41 U/L 19  ALT Latest Ref Range: 17 - 63 U/L 14 (L)  Total Protein Latest Ref Range: 6Paul5 - 8Paul1 g/dL 7Paul7  Total Bilirubin Latest Ref Range: 0Paul3 - 1Paul2 mg/dL 0Paul7  Total Protein ELP Latest Ref Range: 6Paul0 - 8Paul5 g/dL 7Paul5  Albumin SerPl Elph-Mcnc Latest Ref Range: 2Paul9 - 4Paul4 g/dL 3Paul1  Albumin/Glob SerPl Latest Ref Range: 0Paul7 - 1Paul7  0Paul8  Alpha2 Glob SerPl Elph-Mcnc Latest Ref Range: 0Paul4 - 1Paul0 g/dL 0Paul5  Alpha 1 Latest Ref Range: 0Paul0 - 0Paul4 g/dL 0Paul3  Gamma Glob SerPl Elph-Mcnc Latest Ref Range: 0Paul4 - 1Paul8 g/dL 2Paul7 (H)  IFE 1 Unknown Comment  Globulin, Total Latest Ref Range: 2Paul2 - 3Paul9 g/dL 4Paul4 (H)  B-Globulin SerPl Elph-Mcnc Latest Ref Range: 0Paul7 - 1Paul3 g/dL 0Paul8  IgG (Immunoglobin G), Serum Latest Ref Range: 700 - 1,600 mg/dL 654 (L)  IgA Latest Ref Range: 61 - 437 mg/dL 173  IgM, Serum Latest Ref Range: 15 - 143 mg/dL 2,866 (H)  M Protein SerPl Elph-Mcnc Latest Ref Range: Not Observed g/dL 2Paul1 (H)  WBC Latest Ref Range: 4Paul0 - 10Paul5 K/uL 2Paul7 (L)  RBC Latest Ref Range: 4Paul22 - 5Paul81 MIL/uL 3Paul75 (L)  Hemoglobin Latest Ref Range: 13Paul0 - 17Paul0 g/dL  10Paul3 (L)  HCT Latest Ref Range: 39Paul0 - 52Paul0 % 31Paul7 (L)  MCV Latest Ref Range: 78Paul0 - 100Paul0 fL 84Paul5  MCH Latest Ref Range: 26Paul0 - 34Paul0 pg 27Paul5  MCHC Latest Ref Range: 30Paul0 - 36Paul0 g/dL 32Paul5  RDW Latest Ref Range: 11Paul5 - 15Paul5 % 15Paul1  Platelets Latest Ref Range: 150 - 400 K/uL 83 (L)  Neutrophils Latest Units: % 52  Lymphocytes Latest Units: % 36  Monocytes Relative Latest Units: % 7  Eosinophil Latest Units: % 4  Basophil Latest Units: % 0  NEUT# Latest Ref Range: 1Paul7 - 7Paul7 K/uL 1Paul4 (L)  Lymphocyte # Latest Ref Range: 0Paul7 - 4Paul0 K/uL 1Paul0  Monocyte # Latest Ref Range: 0Paul1 - 1Paul0 K/uL 0Paul2  Eosinophils Absolute Latest Ref Range: 0Paul0 - 0Paul7 K/uL 0Paul1  Basophils Absolute Latest Ref Range: 0Paul0 - 0Paul1 K/uL 0Paul0  Please Note (HCV): Unknown Comment            RADIOGRAPHIC STUDIES: I have personally reviewed the radiological images as listed and agreed with the findings in the report. Ct Abdomen Pelvis Wo Contrast  Result Date: 06/01/2016 CLINICAL DATA:  82 year old male with perforation of the tests and several weeks ago. Chronic lymphocytic leukemia. Anemia. EXAM: CT ABDOMEN AND PELVIS WITHOUT CONTRAST TECHNIQUE: Multidetector CT imaging of the abdomen and pelvis was performed following the standard protocol without IV contrast. COMPARISON:  05/16/2016 CT.  05/13/2016 PET-CT. FINDINGS: Lower chest:  Negative. Hepatobiliary: Unenhanced imaging without abnormality. Pancreas: Unenhanced imaging without abnormality. Spleen: Marked splenomegaly once again noted. Adrenals/Urinary Tract: Unenhanced imaging without abnormality noted. Stomach/Bowel: Circumferential diffuse thickening of the mid to distal sigmoid colon which contains small number of diverticula. Adjacent small amount of fluid remains. Amount of fluid has decreased since prior examination with interval clearing of previously noted small amount of extraluminal air. It is possible that this represents diverticulitis which has improved but  incompletely cleared. Underlying mass whether related to primary malignancy or lymphoma of the sigmoid colon not excluded given the fact that this region was hypermetabolic on recent PET-CT. Shifting fluid within the upper quadrant adjacent to the duodenum/jejunum. No primary small bowel abnormality detected as a source of  this fluid which may represent fluid from the pelvic inflammatory process shifting superiorly. Under distended stomach with limited evaluation without gross abnormality. Vascular/Lymphatic: Calcified plaque aorta and iliac arteries without aneurysmal dilation. No adenopathy detected. Reproductive: Enlarged prostate gland. Other: As above. Musculoskeletal: Schmorl's node deformity superior endplate L4. Schmorl's node deformity/superior endplate compression fracture L5. Findings unchanged. Questionable diffuse osseous involvement by lymphoma (noted on PET-CT) not appreciated by CT. IMPRESSION: Circumferential diffuse thickening of the mid to distal sigmoid colon. Adjacent small amount of fluid has decreased since prior examination with interval clearing of previously noted small amount of extraluminal air. It is possible that this represents diverticulitis which has improved but incompletely cleared. Underlying mass whether related to primary malignancy or lymphoma of the sigmoid colon not excluded given the fact that this region was hypermetabolic on recent PET-CT. Shifting fluid within the upper quadrant adjacent to the duodenum/jejunum. No primary small bowel abnormality detected as a source of this fluid which may represent fluid from the pelvic inflammatory process shifting superiorly. Marked splenomegaly once again noted. Enlarged prostate gland. Aortic atherosclerosis. Electronically Signed   By: Genia Del MPaulD.   On: 06/01/2016 09:38   Study Result   CLINICAL DATA:  Initial treatment strategy for lymphoma.  EXAM: NUCLEAR MEDICINE PET SKULL BASE TO THIGH  TECHNIQUE: 9Paul4 mCi  F-18 FDG was injected intravenously. Full-ring PET imaging was performed from the skull base to thigh after the radiotracer. CT data was obtained and used for attenuation correction and anatomic localization.  FASTING BLOOD GLUCOSE:  Value: 102 mg/dl  COMPARISON:  None.  FINDINGS: NECK  No hypermetabolic lymph nodes in the neck.  CHEST  No hypermetabolic mediastinal or hilar nodes. No suspicious pulmonary nodules on the CT scan.  ABDOMEN/PELVIS  No abnormal hypermetabolic activity within the liver, pancreas, adrenal glands, or spleen. No hypermetabolic lymph nodes in the abdomen or pelvis.  Splenomegaly. The spleen measures 17Paul5 x 17Paul0 x 12 cm. Mild diffuse hypermetabolism when compared to the liver.  SKELETON  Diffuse uptake in the bony structures likely due tp diffuse marrow disease, narrowing or marrow stimulating drugs.  IMPRESSION: Splenomegaly and mild diffuse increase metabolic activity in the spleen consistent with lymphomatous involvement.  Diffuse osseous uptake likely due to diffuse lymphoma. Recommend correlation with any marrow stimulating drugs or rebound phenomenon.  No enlarged or hypermetabolic lymph nodes in the neck, chest, abdomen or pelvis.   Electronically Signed   By: Marijo Sanes MPaulD.   On: 05/13/2016 14:51     ASSESSMENT & PLAN:  NHL, low grade, heavy marrow involvement Pancytopenia with neutropenia IgM Kappa monoclonal gammopathy L4-L5 vertebral fracture Splenomegaly secondary to NHL Diffuse osseous uptake on PET secondary to NHL Recent diverticulitis/diverticular abscess  82 year old Caucasian male with good performance status with minimal chronic medical comorbidities with  #1 Pancytopenia with marrow showing lymphocytic infiltration concerning for lymphoproliferative process. LPL and MCL in the top differentials.  #2 IgM Kappa monoclonal gammopathy with preliminary bone marrow picture showing lymphocytic  infiltration concerning for Waldenstrom's Macroglobulinemia/lymphoplasmacytic lymphoma. IgM levels today have just crossed 2 g/dL. Patient has no clinical symptoms of hyperviscosity which I would not expect that these protein levels.  IgM  multiple myeloma would be exceptionally rare.  The patient is here for Cycle #2 Rituximab. Given recent hospitalization and ongoing IV Antibiotics I have recommended holding therapy until next week. .   Ongoing weight loss noted. He has lost 6 lbs since 06/03/16.  The patient is scheduled to follow with Dr. Ladona Keith next  week. Addition of other therapy to rituxan will be difficult given his recent diverticulitis/abscess. Will have to follow his counts as he has had intermittent neutropenia most likely from heavy marrow involvement with NHL.   The patient was given Boost/Ensure samples today. Encouraged him to try [employer], as well.   He will return for treatment and follow up next Friday, 8/11.  All of the patients and his family's questions were answered in details to their apparent satisfaction. The patient knows to call the clinic with any problems, questions or concerns.  This document serves as a record of services personally performed by Ancil Linsey, MD. It was created on her behalf by Arlyce Harman, a trained medical scribe. The creation of this record is based on the scribe's personal observations and the provider's statements to them. This document has been checked and approved by the attending provider.  I have reviewed the above documentation for accuracy and completeness, and I agree with the above.  Kelby Fam. Mylo Choi, MD  06/18/2016 11:36 AM

## 2016-06-18 NOTE — Patient Instructions (Signed)
[place] at [facility] Discharge Instructions  RECOMMENDATIONS MADE BY THE CONSULTANT AND ANY TEST RESULTS WILL BE SENT TO YOUR REFERRING PHYSICIAN.  You were seen by Dr Whitney Muse today. Plan for Ritoxan treatment next Friday and follow-up with Paul Keith.   Lab work with follow-up as well.   Call clinic with any questions or concerns.   Thank you for choosing [place] at [facility] to provide your oncology and hematology care.  To afford each patient quality time with our provider, please arrive at least 15 minutes before your scheduled appointment time.   Beginning January 23rd 2017 lab work for the [name] will be done in the  Main lab at [facility] on 1st floor. If you have a lab appointment with the [facility] please come in thru the  Main Entrance and check in at the main information desk  You need to re-schedule your appointment should you arrive 10 or more minutes late.  We strive to give you quality time with our providers, and arriving late affects you and other patients whose appointments are after yours.  Also, if you no show three or more times for appointments you may be dismissed from the clinic at the providers discretion.     Again, thank you for choosing [facility].  Our hope is that these requests will decrease the amount of time that you wait before being seen by our physicians.       _____________________________________________________________  Should you have questions after your visit to [facility] please contact our office at [phone] between the hours of 8:30 a.m. and 4:30 p.m.  Voicemails left after 4:30 p.m. will not be returned until the following business day.  For prescription refill requests, have your pharmacy contact our office.         Resources For Cancer Patients and their Caregivers ? American Cancer Society: Can assist with transportation, wigs, general needs,  runs Look Good Feel Better.        231-135-5154 ? Cancer Care: Provides financial assistance, online support groups, medication/co-pay assistance.  1-800-813-HOPE 903-123-5067) ? [place] Assists [place] cancer patients and their families through emotional , educational and financial support.  (571)080-0876 ? [place] DSS Where to apply for food stamps, Medicaid and utility assistance. (628) 616-3886 ? RCATS: Transportation to medical appointments. (217)420-3386 ? Social Security Administration: May apply for disability if have a Stage IV cancer. 9023797120 (513)491-2061 ? [employer], Disability and Transit Services: Assists with nutrition, care and transit needs. [place] Support Programs: @10RELATIVEDAYS @ > Cancer Support Group  2nd Tuesday of the month 1pm-2pm, Journey Room  > Creative Journey  3rd Tuesday of the month 1130am-1pm, Journey Room  > Look Good Feel Better  1st Wednesday of the month 10am-12 noon, Journey Room (Call [place] to register [phone])

## 2016-06-18 NOTE — Progress Notes (Signed)
Rituxan held today per M.D.

## 2016-06-21 NOTE — Progress Notes (Signed)
[name] Kitchen    HEMATOLOGY/ONCOLOGY CONSULTATION NOTE  Date of Service: 05/21/2016  Patient Care Team: Octavio Graves, DO as PCP - General  CHIEF COMPLAINTS/PURPOSE OF CONSULTATION:   Pancytopenia.  HISTORY OF PRESENTING ILLNESS:   Paul Keith is a wonderful 82 y.o. male who has been referred to Paul Keith by Dr .Octavio Graves, DO for evaluation and management of pancytopenia.  Patient is a fairly healthy and active gentleman with no significant chronic medical issues who reports having progressive fatigue over the last several months and especially over the last 6 weeks. He also reports that he was doing some yard work and picking up some wood lower back pain and was noted to have what he describes as an L4-L5 fracture diagnosed on an x-ray of his lumbar spine about 6 weeks ago. He reports that he has not had any other imaging for his spine. He has been taking Vicodin and ibuprofen as needed for his pain. He reports no overt bruising or bleeding. No issues with infections fevers or chills. Fatigue as noted above but no lightheadedness or dizziness or presyncopal symptoms. No chest pain or acute shortness of breath or significant dyspnea on exertion. Does report having lost about 22 pounds since late last year though he reports that he has been more physically active and has been eating better.  Notes no fevers chills night sweats.  Labs done by his primary care physician on 04/23/2016 showed pancytopenia hemoglobin of 9 with an MCV of 86.6, WBC count of 2.1k with an ANC of 1k, platelets of 77k.  He was noted to have an elevated sedimentation rate of 70, with an SPEP showing an M spike of 1.4 g/dL IgM level of 1917 with immunofixation electrophoresis showing an IgM monoclonal protein with kappa Light Chain Specificity. Serum free light chains showed significantly elevated kappa light chains. Peripheral blood flow cytometry showed a minor CD5 positive lymphocyte population which was CD23  negative.  Patient subsequently had a bone marrow biopsy on 05/06/2016. Final pathology results from his bone marrow are currently pending. I discussed the preliminary results with Dr.Smirr who reports of the bone marrow shows significant lymphocytic infiltration. No significant increase in plasma cells. LPL and MCL are high on the differential diagnosis.   INTERVAL HISTORY  Paul Keith is here for follow-up of his pancytopenia and to discuss the results of his bone marrow biopsy.  He notes that he had developed abdominal pain and was admitted to [facility] where he was found to have diverticulitis with a small diverticular abscess that did not require drainage. He was treated with bowel rest and IV antibiotics and is completing oral antibiotics at this time-ciprofloxacin plus Augmentin. His abdominal pain has resolved. We discussed his bone marrow biopsy results in details and all of his and his family's questions were answered in details.  MEDICAL HISTORY:  Past Medical History:  Diagnosis Date  . Anemia 04/22/2016  . Diverticulosis    pt reports recent admission for diverticulosis perforation this past week  . Pancytopenia ([place]) 04/22/2016  BPH L4-L5 fracture 6 weeks ago Vasomotor rhinitis for several years.  SURGICAL HISTORY: Past Surgical History:  Procedure Laterality Date  . CATARACT EXTRACTION Bilateral   . TONSILLECTOMY      SOCIAL HISTORY: Social History   Social History  . Marital status: Married    Spouse name: N/A  . Number of children: N/A  . Years of education: N/A   Occupational History  . Not on file.   Social History Main  Topics  . Smoking status: Former Smoker    Quit date: 09/15/1962  . Smokeless tobacco: Never Used  . Alcohol use Yes     Comment: OCCASSIONAL  . Drug use: No  . Sexual activity: Not on file   Other Topics Concern  . Not on file   Social History Narrative  . No narrative on file    FAMILY HISTORY: History reviewed. No  pertinent family history.  ALLERGIES:  has No Known Allergies.  MEDICATIONS:  Current Outpatient Prescriptions  Medication Sig Dispense Refill  . amoxicillin-clavulanate (AUGMENTIN) 875-125 MG tablet Take 1 tablet by mouth 2 (two) times daily. Pt reports recently started augmentin d/t diverticulosis perforation treatment    . B Complex-C (SUPER B COMPLEX PO) Take by mouth. Pt reports unsure of dosage, takes one tablet per day    . cyanocobalamin 100 MCG tablet Take by mouth daily. Pt reports unsure of dosage, takes once daily    . furosemide (LASIX) 20 MG tablet Take 20 mg by mouth daily.    . tamsulosin (FLOMAX) 0.4 MG CAPS capsule Take 0.4 mg by mouth.    . ciprofloxacin (CIPRO) 500 MG tablet Take 1 tablet (500 mg total) by mouth 2 (two) times daily. 14 tablet 0  . metroNIDAZOLE (FLAGYL) 500 MG tablet Take 1 tablet (500 mg total) by mouth 3 (three) times daily. 21 tablet 0  . RiTUXimab (RITUXAN IV) Inject into the vein.     No current facility-administered medications for this visit.     REVIEW OF SYSTEMS:    10 Point review of Systems was done is negative except as noted above.  PHYSICAL EXAMINATION: ECOG PERFORMANCE STATUS: 1 - Symptomatic but completely ambulatory  . Vitals:   05/21/16 1559  BP: (!) 122/56  Pulse: 85  Resp: 16  Temp: 98.3 F (36.8 C)   Filed Weights   05/21/16 1559  Weight: 179 lb 3.2 oz (81.3 kg)   .Body mass index is 24.3 kg/m.  GENERAL:alert, in no acute distress and comfortable SKIN: skin color, texture, turgor are normal, no rashes or significant lesions EYES: normal, conjunctiva are pink and non-injected, sclera clear OROPHARYNX:no exudate, no erythema and lips, buccal mucosa, and tongue normal  NECK: supple, no JVD, thyroid normal size, non-tender, without nodularity LYMPH:  no palpable lymphadenopathy in the cervical, axillary or inguinal LUNGS: clear to auscultation with normal respiratory effort HEART: regular rate & rhythm,  no murmurs  and no lower extremity edema ABDOMEN: abdomen soft, non-tender, normoactive bowel sounds no overt palpable hepatosplenomegaly Musculoskeletal: no cyanosis of digits and no clubbing  PSYCH: alert & oriented x 3 with fluent speech NEURO: no focal motor/sensory deficits  LABORATORY DATA:  I have reviewed the data as listed  . CBC Latest Ref Rng & Units 05/21/2016 05/07/2016  WBC 4.0 - 10.5 K/uL 2.8(L) 2.6(L)  Hemoglobin 13.0 - 17.0 g/dL 10.2(L) 8.6(L)  Hematocrit 39.0 - 52.0 % 30.8(L) 26.3(L)  Platelets 150 - 400 K/uL 125(L) 83(L)    . CMP Latest Ref Rng & Units 05/21/2016 05/14/2016  Glucose 65 - 99 mg/dL 101(H) 120(H)  BUN 6 - 20 mg/dL 17 27(H)  Creatinine 0.61 - 1.24 mg/dL 1.01 1.42(H)  Sodium 135 - 145 mmol/L 135 134(L)  Potassium 3.5 - 5.1 mmol/L 3.6 4.0  Chloride 101 - 111 mmol/L 105 105  CO2 22 - 32 mmol/L 26 23  Calcium 8.9 - 10.3 mg/dL 8.0(L) 8.4(L)  Total Protein 6.5 - 8.1 g/dL 6.6 7.0  Total Bilirubin 0.3 -  1.2 mg/dL 0.7 0.6  Alkaline Phos 38 - 126 U/L 88 82  AST 15 - 41 U/L 25 19  ALT 17 - 63 U/L 19 17   Component     Latest Ref Rng & Units 05/21/2016  Hepatitis B Surface Ag     Negative Negative  Hep B S Ab      Non Reactive  Hep B Core Ab, Tot     Negative Negative    Component     Latest Ref Rng 04/23/2016 04/23/2016 05/04/2016 05/04/2016        10:05 AM 10:25 AM  5:00 PM  5:05 PM  Total Protein ELP     6.0 - 8.5 g/dL 7.0 7.1    Albumin ELP     2.9 - 4.4 g/dL 3.4     Alpha-1-Globulin     0.0 - 0.4 g/dL 0.3     Alpha-2-Globulin     0.4 - 1.0 g/dL 0.5     Beta Globulin     0.7 - 1.3 g/dL 0.8     Gamma Globulin     0.4 - 1.8 g/dL 2.1 (H)     M-SPIKE, %     Not Observed g/dL 1.4 (H)     SPE Interp.      Comment     Comment      Comment     Globulin, Total     2.2 - 3.9 g/dL 3.6     A/G Ratio     0.7 - 1.7 0.9     IgG (Immunoglobin G), Serum     700 - 1600 mg/dL 733 723    IgA     61 - 437 mg/dL 198 191    IgM, Serum     15 - 143 mg/dL 1840 (H) 1917  (H)    Immunofixation Result, Serum       Comment    Iron     45 - 182 ug/dL 28 (L)     TIBC     250 - 450 ug/dL 223 (L)     Saturation Ratios     17.9 - 39.5 % 13 (L)     UIBC      195     Retic Ct Pct     0.4 - 3.1 % 1.7     RBC.     4.22 - 5.81 MIL/uL 3.13 (L)     Retic Count, Manual     19.0 - 186.0 K/uL 53.2     Kappa free light chain     3.3 - 19.4 mg/L 189.8 (H)     Lamda free light chains     5.7 - 26.3 mg/L 17.2     Kappa, lamda light chain ratio     0.26 - 1.65 11.03 (H)     Vitamin B12     180 - 914 pg/mL 241     Folate     >5.9 ng/mL 36.5     Ferritin     24 - 336 ng/mL 77     Erythropoietin     2.6 - 18.5 mIU/mL 62.3 (H)     LDH     98 - 192 U/L 130     CRP     <1.0 mg/dL 1.8 (H)     Haptoglobin     34 - 200 mg/dL 40     Sed Rate     0 - 16 mm/hr 70 (H)     Fecal  Occult Blood, POC     NEGATIVE   NEGATIVE NEGATIVE  Immunofixation electrophoresis      The value has a corrected status.      No reference range information available      Comments:           (NOTE)           Immunofixation shows IgM monoclonal protein with kappa light           chain           specificity.    RADIOGRAPHIC STUDIES: I have personally reviewed the radiological images as listed and agreed with the findings in the report.  Study Result   CLINICAL DATA:  Initial treatment strategy for lymphoma.  EXAM: NUCLEAR MEDICINE PET SKULL BASE TO THIGH  TECHNIQUE: 9.4 mCi F-18 FDG was injected intravenously. Full-ring PET imaging was performed from the skull base to thigh after the radiotracer. CT data was obtained and used for attenuation correction and anatomic localization.  FASTING BLOOD GLUCOSE:  Value: 102 mg/dl  COMPARISON:  None.  FINDINGS: NECK  No hypermetabolic lymph nodes in the neck.  CHEST  No hypermetabolic mediastinal or hilar nodes. No suspicious pulmonary nodules on the CT scan.  ABDOMEN/PELVIS  No abnormal hypermetabolic activity  within the liver, pancreas, adrenal glands, or spleen. No hypermetabolic lymph nodes in the abdomen or pelvis.  Splenomegaly. The spleen measures 17.5 x 17.0 x 12 cm. Mild diffuse hypermetabolism when compared to the liver.  SKELETON  Diffuse uptake in the bony structures likely due tp diffuse marrow disease, narrowing or marrow stimulating drugs.  IMPRESSION: Splenomegaly and mild diffuse increase metabolic activity in the spleen consistent with lymphomatous involvement.  Diffuse osseous uptake likely due to diffuse lymphoma. Recommend correlation with any marrow stimulating drugs or rebound phenomenon.  No enlarged or hypermetabolic lymph nodes in the neck, chest, abdomen or pelvis.   Electronically Signed   By: Marijo Sanes M.D.   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  #2 IgM Kappa monoclonal gammopathy with preliminary bone marrow picture showing lymphocytic infiltration consistent with Waldenstrom's Macroglobulinemia/lymphoplasmacytic lymphoma.  Patient has no clinical symptoms of hyperviscosity which I would not expect that these protein levels.   #3 normocytic anemia -likely due to above process. Hemoglobin is relatively stable at 10.2. No significant symptoms at this time to warrant transfusion. With elevated IgM levels would avoid hyper transfusion that could increase blood viscosity.  #4 thrombocytopenia no evidence of active bleeding. Likely due to the same marrow infiltrative process. Completely avoid NSAIDs or other medications that could affect platelet function.  #5 leukopenia/neutropenia-likely due to lymphocytic bone marrow infiltrative process. Neutropenia as mild and the patient has no signs of active infection  or fevers at this time.  #6  L4-L5 recent fracture with no significant trauma . Concern for pathologic fracture . ? Lymphomatous involvement of the spine . We will be better characterized on his PET/CT scan . Patient has currently no concerning neurological symptoms are uncontrolled back pain at this time.  PLAN -We discussed the bone marrow results and PET scan results in details. -We discussed the diagnosis, natural history, prognosis and treatment options for his newly diagnosed Waldenstrm's macroglobulinemia/lymphoplasmacytic lymphoma. -Given his recent diverticulitis we shall allow him another couple of weeks to complete his antibiotics  and repeat CT scan to ensure resolution prior to starting treatment. -Patient is agreeable to start on Rituxan monotherapy at this time. We'll plan to do weekly doses 4 and reassess response with IgM levels and M protein as well as his blood counts. -If suboptimal response and no infection issues might consider the use of Velcade or bendamustine in addition. Alternatively could consider switching to Ibrutinib -If the patient has response to Rituxan monotherapy could do 4 additional doses weekly and then switched to maintenance Rituxan every 2 months. -Chemotherapy counseling for Rituxan -No acute indication for PRBC or platelet transfusion at this time. -He was counseled to call Paul Keith immediately for any bleeding issues or significant increase in fatigue or other symptoms of symptomatic anemia. -Recommended 2 completely avoid NSAIDs due to increased risk of bleeding. -Recommended he start B complex due to borderline low B12 levels.   Return to care with Dr. Whitney Muse in 2 weeks with rpt cbc, cmp and CT abd/pelvis . Orders Placed This Encounter  Procedures  . CT Abdomen Pelvis Wo Contrast    Standing Status:   Future    Number of Occurrences:   1    Standing Expiration Date:   05/21/2017    Order Specific Question:   Reason for Exam (SYMPTOM  OR DIAGNOSIS  REQUIRED)    Answer:   f/u for sigmoid diverticulitis with perforation prior to starting chemotherapy. No IV contrast due to CKD    Order Specific Question:   Preferred imaging location?    Answer:   [facility]  . CBC with Differential/Platelet    Standing Status:   Future    Number of Occurrences:   1    Standing Expiration Date:   05/21/2017  . Comprehensive metabolic panel    Standing Status:   Future    Number of Occurrences:   1    Standing Expiration Date:   05/21/2017  . CBC with Differential/Platelet    Standing Status:   Future    Number of Occurrences:   1    Standing Expiration Date:   05/21/2017  . Comprehensive metabolic panel    Standing Status:   Future    Number of Occurrences:   1    Standing Expiration Date:   05/21/2017  . Multiple Myeloma Panel (SPEP&IFE w/QIG)    Standing Status:   Future    Number of Occurrences:   1    Standing Expiration Date:   05/21/2017  . Hepatitis B surface antigen    Standing Status:   Future    Number of Occurrences:   1    Standing Expiration Date:   06/25/2017  . Hepatitis B surface antibody    Standing Status:   Future    Number of Occurrences:   1    Standing Expiration Date:   06/25/2017  . Hepatitis B core antibody, total    Standing Status:   Future    Number of Occurrences:   1    Standing Expiration Date:   05/21/2017     All of the patients and his family's questions were answered in details to their apparent satisfaction. The patient knows to call the clinic with any problems, questions or concerns.  I spent 40 minutes counseling the patient face to face. The total time spent in the appointment was 40 minutes and more than 50% was on counseling and direct patient cares.    Sullivan Lone MD [place] [facility]AHIV[facility] Hematology/Oncology Physician [facility]  (Office):       [phone] (Work cell):  [phone] (Fax):           [phone]

## 2016-06-23 ENCOUNTER — Encounter (HOSPITAL_COMMUNITY): Payer: Self-pay | Admitting: Emergency Medicine

## 2016-06-23 NOTE — Progress Notes (Unsigned)
Spoke with pts wife that Dr Ladona Horns did not want to anything that the healing process is 6-8 weeks and that he did not want to proceed with anything not even a colonoscopy til after that period.

## 2016-06-24 ENCOUNTER — Ambulatory Visit (HOSPITAL_COMMUNITY): Payer: Medicare Other

## 2016-06-25 ENCOUNTER — Encounter (HOSPITAL_BASED_OUTPATIENT_CLINIC_OR_DEPARTMENT_OTHER): Payer: Medicare Other | Admitting: Oncology

## 2016-06-25 ENCOUNTER — Encounter (HOSPITAL_COMMUNITY): Payer: Self-pay | Admitting: Oncology

## 2016-06-25 ENCOUNTER — Encounter (HOSPITAL_BASED_OUTPATIENT_CLINIC_OR_DEPARTMENT_OTHER): Payer: Medicare Other

## 2016-06-25 ENCOUNTER — Encounter (HOSPITAL_COMMUNITY): Payer: Self-pay

## 2016-06-25 VITALS — BP 101/47 | HR 80 | Temp 97.8°F | Resp 16 | Wt 151.8 lb

## 2016-06-25 DIAGNOSIS — Z5112 Encounter for antineoplastic immunotherapy: Secondary | ICD-10-CM

## 2016-06-25 DIAGNOSIS — R5383 Other fatigue: Secondary | ICD-10-CM | POA: Diagnosis not present

## 2016-06-25 DIAGNOSIS — D696 Thrombocytopenia, unspecified: Secondary | ICD-10-CM | POA: Diagnosis not present

## 2016-06-25 DIAGNOSIS — Z79899 Other long term (current) drug therapy: Secondary | ICD-10-CM | POA: Diagnosis not present

## 2016-06-25 DIAGNOSIS — C8300 Small cell B-cell lymphoma, unspecified site: Secondary | ICD-10-CM | POA: Diagnosis not present

## 2016-06-25 DIAGNOSIS — D649 Anemia, unspecified: Secondary | ICD-10-CM | POA: Diagnosis not present

## 2016-06-25 DIAGNOSIS — D61818 Other pancytopenia: Secondary | ICD-10-CM | POA: Diagnosis present

## 2016-06-25 DIAGNOSIS — C880 Waldenstrom macroglobulinemia: Secondary | ICD-10-CM

## 2016-06-25 DIAGNOSIS — Z87891 Personal history of nicotine dependence: Secondary | ICD-10-CM | POA: Diagnosis not present

## 2016-06-25 DIAGNOSIS — Z9889 Other specified postprocedural states: Secondary | ICD-10-CM | POA: Diagnosis not present

## 2016-06-25 LAB — CBC WITH DIFFERENTIAL/PLATELET
BASOS PCT: 0 %
Basophils Absolute: 0 10*3/uL (ref 0.0–0.1)
EOS ABS: 0.1 10*3/uL (ref 0.0–0.7)
Eosinophils Relative: 4 %
HEMATOCRIT: 26.2 % — AB (ref 39.0–52.0)
Hemoglobin: 8.5 g/dL — ABNORMAL LOW (ref 13.0–17.0)
Lymphocytes Relative: 37 %
Lymphs Abs: 0.7 10*3/uL (ref 0.7–4.0)
MCH: 26.9 pg (ref 26.0–34.0)
MCHC: 32.4 g/dL (ref 30.0–36.0)
MCV: 82.9 fL (ref 78.0–100.0)
MONO ABS: 0.2 10*3/uL (ref 0.1–1.0)
MONOS PCT: 9 %
NEUTROS ABS: 1 10*3/uL — AB (ref 1.7–7.7)
Neutrophils Relative %: 51 %
Platelets: 92 10*3/uL — ABNORMAL LOW (ref 150–400)
RBC: 3.16 MIL/uL — ABNORMAL LOW (ref 4.22–5.81)
RDW: 16.4 % — AB (ref 11.5–15.5)
WBC: 2 10*3/uL — ABNORMAL LOW (ref 4.0–10.5)

## 2016-06-25 LAB — COMPREHENSIVE METABOLIC PANEL
ALBUMIN: 2.8 g/dL — AB (ref 3.5–5.0)
ALT: 14 U/L — ABNORMAL LOW (ref 17–63)
ANION GAP: 6 (ref 5–15)
AST: 16 U/L (ref 15–41)
Alkaline Phosphatase: 93 U/L (ref 38–126)
BILIRUBIN TOTAL: 0.8 mg/dL (ref 0.3–1.2)
BUN: 23 mg/dL — ABNORMAL HIGH (ref 6–20)
CO2: 24 mmol/L (ref 22–32)
Calcium: 8 mg/dL — ABNORMAL LOW (ref 8.9–10.3)
Chloride: 101 mmol/L (ref 101–111)
Creatinine, Ser: 1.28 mg/dL — ABNORMAL HIGH (ref 0.61–1.24)
GFR calc Af Amer: 58 mL/min — ABNORMAL LOW (ref >60)
GFR calc non Af Amer: 50 mL/min — ABNORMAL LOW (ref >60)
GLUCOSE: 107 mg/dL — AB (ref 65–99)
POTASSIUM: 4.1 mmol/L (ref 3.5–5.1)
Sodium: 131 mmol/L — ABNORMAL LOW (ref 135–145)
TOTAL PROTEIN: 6.9 g/dL (ref 6.5–8.1)

## 2016-06-25 LAB — URIC ACID: URIC ACID, SERUM: 5.3 mg/dL (ref 4.4–7.6)

## 2016-06-25 LAB — PHOSPHORUS: PHOSPHORUS: 2.7 mg/dL (ref 2.5–4.6)

## 2016-06-25 LAB — LACTATE DEHYDROGENASE: LDH: 108 U/L (ref 98–192)

## 2016-06-25 LAB — MAGNESIUM: MAGNESIUM: 2.1 mg/dL (ref 1.7–2.4)

## 2016-06-25 MED ORDER — HEPARIN SOD (PORK) LOCK FLUSH 100 UNIT/ML IV SOLN
INTRAVENOUS | Status: AC
  Filled 2016-06-25: qty 5

## 2016-06-25 MED ORDER — SODIUM CHLORIDE 0.9 % IV SOLN: 375.0000 mg/m2 | Freq: Once | INTRAVENOUS | Status: DC

## 2016-06-25 MED ORDER — DIPHENHYDRAMINE HCL 25 MG PO CAPS
50.0000 mg | ORAL_CAPSULE | Freq: Once | ORAL | Status: AC
  Administered 2016-06-25: 50 mg via ORAL

## 2016-06-25 MED ORDER — ACETAMINOPHEN 325 MG PO TABS
ORAL_TABLET | ORAL | Status: AC
  Filled 2016-06-25: qty 2

## 2016-06-25 MED ORDER — DIPHENHYDRAMINE HCL 25 MG PO CAPS
ORAL_CAPSULE | ORAL | Status: AC
  Filled 2016-06-25: qty 2

## 2016-06-25 MED ORDER — SODIUM CHLORIDE 0.9 % IV SOLN
375.0000 mg/m2 | Freq: Once | INTRAVENOUS | Status: AC
  Administered 2016-06-25: 700 mg via INTRAVENOUS
  Filled 2016-06-25: qty 50

## 2016-06-25 MED ORDER — HEPARIN SOD (PORK) LOCK FLUSH 100 UNIT/ML IV SOLN
500.0000 [IU] | Freq: Once | INTRAVENOUS | Status: AC | PRN
  Administered 2016-06-25: 500 [IU]

## 2016-06-25 MED ORDER — SODIUM CHLORIDE 0.9 % IV SOLN
Freq: Once | INTRAVENOUS | Status: AC
  Administered 2016-06-25: 09:00:00 via INTRAVENOUS

## 2016-06-25 MED ORDER — ACETAMINOPHEN 325 MG PO TABS
650.0000 mg | ORAL_TABLET | Freq: Once | ORAL | Status: AC
  Administered 2016-06-25: 650 mg via ORAL

## 2016-06-25 MED ORDER — SODIUM CHLORIDE 0.9 % IV SOLN
20.0000 mg | Freq: Once | INTRAVENOUS | Status: AC
  Administered 2016-06-25: 20 mg via INTRAVENOUS
  Filled 2016-06-25: qty 2

## 2016-06-25 MED ORDER — SODIUM CHLORIDE 0.9% FLUSH
10.0000 mL | INTRAVENOUS | Status: DC | PRN
  Administered 2016-06-25: 10 mL
  Filled 2016-06-25: qty 10

## 2016-06-25 NOTE — Progress Notes (Signed)
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 male returns for followup of DLBCL with bone marrow involvement complicated by possible involvement of sigmoid colon.    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 The pattern is somewhat nonspecific but the findings are consistent with non-Hodgkin B-cell lymphoma.     05/10/2016 Pathology Results    Bone Marrow, Aspirate,Biopsy, and Clot, right ilium - HYPERCELLULAR BONE MARROW WITH EXTENSIVE INVOLVEMENT BY NON-HODGKIN'S B-CELL LYMPHOMA.     05/13/2016 PET scan    Splenomegaly and mild diffuse increase metabolic activity in the spleen consistent with lymphomatous involvement. Diffuse osseous uptake likely due to diffuse lymphoma. Recommend correlation with any marrow stimulating drugs or rebound phenomenon.     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 Adjacent small amount of fluid has decreased since prior examination with interval clearing of previously noted small amount of extraluminal air.     06/03/2016 -  Antibody Plan    Single-agent Rituxan      He notes that he feels well, other than some fatigue.  He is very interested in pursing treatment as planned.  He saw Dr. Ladona Horns (Gen Surg) recently.  Surgery is not planned at this time.  Review of Systems  Constitutional: Positive for malaise/fatigue and weight loss. Negative for chills and fever.  HENT:  Negative.   Eyes: Negative.   Respiratory: Negative.  Negative for cough.   Cardiovascular: Negative.  Negative for chest pain.  Gastrointestinal: Negative.  Negative for nausea and vomiting.  Genitourinary: Negative.  Negative for dysuria.  Musculoskeletal: Negative.   Skin: Negative.   Neurological: Negative.   Endo/Heme/Allergies: Negative.   Psychiatric/Behavioral: Negative.     Past Medical History:  Diagnosis Date  . Anemia 04/22/2016  . Diverticulosis    pt reports recent admission for diverticulosis perforation this past week  . 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 Pancytopenia ([place]) 04/22/2016    Past Surgical History:  Procedure Laterality Date  . CATARACT EXTRACTION Bilateral   . TONSILLECTOMY      History reviewed. No pertinent family history.  Social History   Social History  . Marital status: Married    Spouse name: N/A  . Number of children: N/A  . Years of education: N/A   Social History Main Topics  . Smoking status: Former Smoker    Quit date: 09/15/1962  . Smokeless tobacco: Never Used  . Alcohol use Yes     Comment: OCCASSIONAL  . Drug use: No  . Sexual activity: Not Asked   Other Topics Concern  . None   Social History Narrative  . None     PHYSICAL EXAMINATION  ECOG PERFORMANCE STATUS: 1 - Symptomatic but completely ambulatory  There were no vitals filed for this visit.  BP 121/57 P 99 R 18 T 97.7 O2 100%  GENERAL:alert, no distress, comfortable, cooperative, smiling and accompanied by wife and in chemo-recliner. SKIN: skin color, texture, turgor  are normal, no rashes or significant lesions HEAD: Normocephalic, No masses, lesions, tenderness or abnormalities EYES: normal, EOMI, Conjunctiva are pink and non-injected EARS: External ears normal OROPHARYNX:lips, buccal mucosa, and tongue normal and mucous membranes are moist  NECK: supple, trachea midline LYMPH:   not examined BREAST:not examined LUNGS: clear to auscultation  HEART: regular rate & rhythm ABDOMEN:abdomen soft and normal bowel sounds BACK: Back symmetric, no curvature. EXTREMITIES:less then 2 second capillary refill, no joint deformities, effusion, or inflammation, no skin discoloration, no cyanosis  NEURO: alert & oriented x 3 with fluent speech   LABORATORY DATA: CBC    Component Value Date/Time   WBC 2.0 (L) 06/25/2016 0900   RBC 3.16 (L) 06/25/2016 0900   HGB 8.5 (L) 06/25/2016 0900   HCT 26.2 (L) 06/25/2016 0900   PLT 92 (L) 06/25/2016 0900   MCV 82.9 06/25/2016 0900   MCH 26.9 06/25/2016 0900   MCHC 32.4 06/25/2016 0900   RDW 16.4 (H) 06/25/2016 0900   LYMPHSABS 0.7 06/25/2016 0900   MONOABS 0.2 06/25/2016 0900   EOSABS 0.1 06/25/2016 0900   BASOSABS 0.0 06/25/2016 0900      Chemistry      Component Value Date/Time   NA 131 (L) 06/25/2016 0900   K 4.1 06/25/2016 0900   CL 101 06/25/2016 0900   CO2 24 06/25/2016 0900   BUN 23 (H) 06/25/2016 0900   CREATININE 1.28 (H) 06/25/2016 0900      Component Value Date/Time   CALCIUM 8.0 (L) 06/25/2016 0900   ALKPHOS 93 06/25/2016 0900   AST 16 06/25/2016 0900   ALT 14 (L) 06/25/2016 0900   BILITOT 0.8 06/25/2016 0900        PENDING LABS:   RADIOGRAPHIC STUDIES:  Ct Abdomen Pelvis Wo Contrast  Result Date: 06/01/2016 CLINICAL DATA:  82 year old male with perforation of the tests and several weeks ago. Chronic lymphocytic leukemia. Anemia. EXAM: CT ABDOMEN AND PELVIS WITHOUT CONTRAST TECHNIQUE: Multidetector CT imaging of the abdomen and pelvis was performed following the standard protocol without IV contrast. COMPARISON:  05/16/2016 CT.  05/13/2016 PET-CT. FINDINGS: Lower chest:  Negative. Hepatobiliary: Unenhanced imaging without abnormality. Pancreas: Unenhanced imaging without abnormality. Spleen: Marked splenomegaly once again noted. Adrenals/Urinary Tract: Unenhanced imaging without abnormality noted.  Stomach/Bowel: Circumferential diffuse thickening of the mid to distal sigmoid colon which contains small number of diverticula. Adjacent small amount of fluid remains. Amount of fluid has decreased since prior examination with interval clearing of previously noted small amount of extraluminal air. It is possible that this represents diverticulitis which has improved but incompletely cleared. Underlying mass whether related to primary malignancy or lymphoma of the sigmoid colon not excluded given the fact that this region was hypermetabolic on recent PET-CT. Shifting fluid within the upper quadrant adjacent to the duodenum/jejunum. No primary small bowel abnormality detected as a source of this fluid which may represent fluid from the pelvic inflammatory process shifting superiorly. Under distended stomach with limited evaluation without gross abnormality. Vascular/Lymphatic: Calcified plaque aorta and iliac arteries without aneurysmal dilation. No adenopathy detected. Reproductive: Enlarged prostate gland. Other: As above. Musculoskeletal: Schmorl's node deformity superior endplate L4. Schmorl's node deformity/superior endplate compression fracture L5. Findings unchanged. Questionable diffuse osseous involvement by lymphoma (noted on PET-CT) not appreciated by CT. IMPRESSION: Circumferential diffuse thickening of the mid to distal sigmoid colon. Adjacent small amount of fluid has decreased since prior examination with interval clearing of previously noted small amount of extraluminal air. It is possible that this represents diverticulitis which  has improved but incompletely cleared. Underlying mass whether related to primary malignancy or lymphoma of the sigmoid colon not excluded given the fact that this region was hypermetabolic on recent PET-CT. Shifting fluid within the upper quadrant adjacent to the duodenum/jejunum. No primary small bowel abnormality detected as a source of this fluid which may represent  fluid from the pelvic inflammatory process shifting superiorly. Marked splenomegaly once again noted. Enlarged prostate gland. Aortic atherosclerosis. Electronically Signed   By: Genia Del M.D.   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  Oncology history developed.  Pre-treatment labs today: CBC diff, CMET, LDH, Uric acid, phosphorus, and magnesium.  I personally reviewed and went over laboratory results with the patient.  The results are noted within this dictation.  Labs satisfy treatment criteria and therefore, he will be treated today.  Progressive anemia is noted and likley secondary to extensive bone marrow involvement.    Labs in ~7 days: CBC diff.  If anemia persists or progresses, will consider PRBC transfusion.  He was recently admitted to [facility].  He has seen Dr. Ladona Horns in consultation regarding his bowel involvement/issue.  We called the patient's daughter 2 days ago and she reports that Dr. Ladona Horns is not interested in surgically managing the patient's colon due to extensive recovery time in addition to recent exacerbation.  Per the patient, Dr. Ladona Horns does not feel that there is lymphomatous involvement of colon.  As a result, he wishes to follow conservatively.  Unfortunately, given the patient's disease, we will push forward with treatment as planned.  He is advised to report to the ED with any issues.  Return next week for cycle #3.  Return in 2 weeks for follow-up and cycle #4.  Based upon response, I suspect the addition of Bendamustine will be needed.    ORDERS PLACED FOR THIS ENCOUNTER: Orders Placed This Encounter  Procedures  . CBC with Differential  . Comprehensive metabolic panel  . Lactate dehydrogenase  . Magnesium  . Phosphorus  . Uric acid    MEDICATIONS PRESCRIBED THIS ENCOUNTER: Meds  ordered this encounter  Medications  . traMADol (ULTRAM) 50 MG tablet    Refill:  0    THERAPY PLAN:  Continue with single-agent Rituxan as planned.  All questions were answered. The patient knows to call the clinic with any problems, questions or concerns. We can certainly see the patient much sooner if necessary.  Patient and plan discussed with Dr. Ancil Linsey and she is in agreement with the aforementioned.   This note is electronically signed by: Doy Mince 06/25/2016 10:09 PM

## 2016-06-25 NOTE — Progress Notes (Signed)
This encounter was created in error - please disregard.

## 2016-06-25 NOTE — Patient Instructions (Signed)
[place] at [facility] Discharge Instructions  RECOMMENDATIONS MADE BY THE CONSULTANT AND ANY TEST RESULTS WILL BE SENT TO YOUR REFERRING PHYSICIAN.  You were seen by Gershon Mussel today. Return next week for treatment Follow up with Dr. Whitney Muse and treatment in 2 weeks Please call the center with any related concerns.  Thank you for choosing [place] at [facility] to provide your oncology and hematology care.  To afford each patient quality time with our provider, please arrive at least 15 minutes before your scheduled appointment time.   Beginning January 23rd 2017 lab work for the [name] will be done in the  Main lab at [facility] on 1st floor. If you have a lab appointment with the [facility] please come in thru the  Main Entrance and check in at the main information desk  You need to re-schedule your appointment should you arrive 10 or more minutes late.  We strive to give you quality time with our providers, and arriving late affects you and other patients whose appointments are after yours.  Also, if you no show three or more times for appointments you may be dismissed from the clinic at the providers discretion.     Again, thank you for choosing [facility].  Our hope is that these requests will decrease the amount of time that you wait before being seen by our physicians.       _____________________________________________________________  Should you have questions after your visit to [facility], please contact our office at [phone] between the hours of 8:30 a.m. and 4:30 p.m.  Voicemails left after 4:30 p.m. will not be returned until the following business day.  For prescription refill requests, have your pharmacy contact our office.         Resources For Cancer Patients and their Caregivers ? American Cancer Society: Can assist with transportation, wigs, general needs, runs Look Good Feel  Better.        506 806 1817 ? Cancer Care: Provides financial assistance, online support groups, medication/co-pay assistance.  1-800-813-HOPE 9546740663) ? [place] Assists [place] cancer patients and their families through emotional , educational and financial support.  (380) 058-3310 ? [place] DSS Where to apply for food stamps, Medicaid and utility assistance. 252 722 3236 ? RCATS: Transportation to medical appointments. 647-391-0595 ? Social Security Administration: May apply for disability if have a Stage IV cancer. 469-045-0692 [phone] ? [employer], Disability and Transit Services: Assists with nutrition, care and transit needs. [place] Support Programs: @10RELATIVEDAYS @ > Cancer Support Group  2nd Tuesday of the month 1pm-2pm, Journey Room  > Creative Journey  3rd Tuesday of the month 1130am-1pm, Journey Room  > Look Good Feel Better  1st Wednesday of the month 10am-12 noon, Journey Room (Call [place] to register [phone])

## 2016-06-25 NOTE — Assessment & Plan Note (Addendum)
DLBCL with bone marrow involvement complicated by possible involvement of sigmoid colon.  Oncology history developed.  Pre-treatment labs today: CBC diff, CMET, LDH, Uric acid, phosphorus, and magnesium.  I personally reviewed and went over laboratory results with the patient.  The results are noted within this dictation.  Labs satisfy treatment criteria and therefore, he will be treated today.  Progressive anemia is noted and likley secondary to extensive bone marrow involvement.    Labs in ~7 days: CBC diff.  If anemia persists or progresses, will consider PRBC transfusion.  He was recently admitted to [facility].  He has seen Dr. Cathey in consultation regarding his bowel involvement/issue.  We called the patient's daughter 2 days ago and she reports that Dr. Cathey is not interested in surgically managing the patient's colon due to extensive recovery time in addition to recent exacerbation.  Per the patient, Dr. Cathey does not feel that there is lymphomatous involvement of colon.  As a result, he wishes to follow conservatively.  Unfortunately, given the patient's disease, we will push forward with treatment as planned.  He is advised to report to the ED with any issues.  Return next week for cycle #3.  Return in 2 weeks for follow-up and cycle #4.  Based upon response, I suspect the addition of Bendamustine will be needed.  

## 2016-06-25 NOTE — Progress Notes (Signed)
Tolerated rituxan infusion well. Ambulatory on discharge home with wife.

## 2016-06-28 ENCOUNTER — Encounter (HOSPITAL_COMMUNITY): Payer: Self-pay | Admitting: Hematology & Oncology

## 2016-06-28 ENCOUNTER — Other Ambulatory Visit (HOSPITAL_COMMUNITY): Payer: Self-pay | Admitting: Emergency Medicine

## 2016-06-28 DIAGNOSIS — D649 Anemia, unspecified: Secondary | ICD-10-CM

## 2016-06-29 NOTE — Progress Notes (Signed)
Called to change lab appt to tomorrow checking blood counts and type and screen for fridays blood transfusion

## 2016-06-30 ENCOUNTER — Encounter (HOSPITAL_COMMUNITY): Payer: Medicare Other

## 2016-06-30 DIAGNOSIS — D61818 Other pancytopenia: Secondary | ICD-10-CM | POA: Diagnosis not present

## 2016-06-30 DIAGNOSIS — D649 Anemia, unspecified: Secondary | ICD-10-CM

## 2016-06-30 LAB — CBC WITH DIFFERENTIAL/PLATELET
BASOS ABS: 0 10*3/uL (ref 0.0–0.1)
Basophils Relative: 1 %
Eosinophils Absolute: 0 10*3/uL (ref 0.0–0.7)
Eosinophils Relative: 2 %
HEMATOCRIT: 27.1 % — AB (ref 39.0–52.0)
HEMOGLOBIN: 8.6 g/dL — AB (ref 13.0–17.0)
LYMPHS PCT: 47 %
Lymphs Abs: 0.9 10*3/uL (ref 0.7–4.0)
MCH: 26.5 pg (ref 26.0–34.0)
MCHC: 31.7 g/dL (ref 30.0–36.0)
MCV: 83.6 fL (ref 78.0–100.0)
MONO ABS: 0.1 10*3/uL (ref 0.1–1.0)
Monocytes Relative: 8 %
NEUTROS ABS: 0.8 10*3/uL — AB (ref 1.7–7.7)
Neutrophils Relative %: 43 %
Platelets: 92 10*3/uL — ABNORMAL LOW (ref 150–400)
RBC: 3.24 MIL/uL — AB (ref 4.22–5.81)
RDW: 17 % — ABNORMAL HIGH (ref 11.5–15.5)
WBC: 1.8 10*3/uL — AB (ref 4.0–10.5)

## 2016-06-30 LAB — PREPARE RBC (CROSSMATCH)

## 2016-06-30 LAB — ABO/RH: ABO/RH(D): O POS

## 2016-07-01 ENCOUNTER — Encounter (HOSPITAL_BASED_OUTPATIENT_CLINIC_OR_DEPARTMENT_OTHER): Payer: Medicare Other

## 2016-07-01 ENCOUNTER — Other Ambulatory Visit (HOSPITAL_COMMUNITY): Payer: Medicare Other

## 2016-07-01 ENCOUNTER — Encounter (HOSPITAL_COMMUNITY): Payer: Self-pay

## 2016-07-01 ENCOUNTER — Other Ambulatory Visit (HOSPITAL_COMMUNITY): Payer: Self-pay | Admitting: Oncology

## 2016-07-01 VITALS — BP 107/49 | HR 96 | Temp 98.3°F | Resp 18

## 2016-07-01 DIAGNOSIS — C8300 Small cell B-cell lymphoma, unspecified site: Secondary | ICD-10-CM

## 2016-07-01 DIAGNOSIS — Z5189 Encounter for other specified aftercare: Secondary | ICD-10-CM

## 2016-07-01 DIAGNOSIS — D61818 Other pancytopenia: Secondary | ICD-10-CM

## 2016-07-01 DIAGNOSIS — T451X5A Adverse effect of antineoplastic and immunosuppressive drugs, initial encounter: Secondary | ICD-10-CM

## 2016-07-01 DIAGNOSIS — C880 Waldenstrom macroglobulinemia: Secondary | ICD-10-CM

## 2016-07-01 DIAGNOSIS — D701 Agranulocytosis secondary to cancer chemotherapy: Secondary | ICD-10-CM

## 2016-07-01 DIAGNOSIS — K5720 Diverticulitis of large intestine with perforation and abscess without bleeding: Secondary | ICD-10-CM

## 2016-07-01 MED ORDER — PEGFILGRASTIM INJECTION 6 MG/0.6ML ~~LOC~~
PREFILLED_SYRINGE | SUBCUTANEOUS | Status: AC
  Filled 2016-07-01: qty 0.6

## 2016-07-01 MED ORDER — PEGFILGRASTIM INJECTION 6 MG/0.6ML ~~LOC~~
6.0000 mg | PREFILLED_SYRINGE | Freq: Once | SUBCUTANEOUS | Status: AC
  Administered 2016-07-01: 6 mg via SUBCUTANEOUS

## 2016-07-01 NOTE — Progress Notes (Signed)
Paul Keith presents today for injection per the provider's orders.  Neulasta administration without incident; see MAR for injection details.  Patient tolerated procedure well and without incident.  No questions or complaints noted at this time.

## 2016-07-01 NOTE — Patient Instructions (Addendum)
[place] at [facility] Discharge Instructions  RECOMMENDATIONS MADE BY THE CONSULTANT AND ANY TEST RESULTS WILL BE SENT TO YOUR REFERRING PHYSICIAN.  Neulasta injection today. Return as scheduled for blood transfusion and Rituxan infusion tomorrow.    Thank you for choosing [place] at [facility] to provide your oncology and hematology care.  To afford each patient quality time with our provider, please arrive at least 15 minutes before your scheduled appointment time.   Beginning January 23rd 2017 lab work for the [name] will be done in the  Main lab at [facility] on 1st floor. If you have a lab appointment with the [facility] please come in thru the  Main Entrance and check in at the main information desk  You need to re-schedule your appointment should you arrive 10 or more minutes late.  We strive to give you quality time with our providers, and arriving late affects you and other patients whose appointments are after yours.  Also, if you no show three or more times for appointments you may be dismissed from the clinic at the providers discretion.     Again, thank you for choosing [facility].  Our hope is that these requests will decrease the amount of time that you wait before being seen by our physicians.       _____________________________________________________________  Should you have questions after your visit to [facility], please contact our office at [phone] between the hours of 8:30 a.m. and 4:30 p.m.  Voicemails left after 4:30 p.m. will not be returned until the following business day.  For prescription refill requests, have your pharmacy contact our office.         Resources For Cancer Patients and their Caregivers ? American Cancer Society: Can assist with transportation, wigs, general needs, runs Look Good Feel Better.        (213)577-7121 ? Cancer Care: Provides  financial assistance, online support groups, medication/co-pay assistance.  1-800-813-HOPE 909 696 3803) ? [facility] Assists [place] cancer patients and their families through emotional , educational and financial support.  216-429-2626 ? [place] DSS Where to apply for food stamps, Medicaid and utility assistance. 203-461-8862 ? RCATS: Transportation to medical appointments. (410)029-7558 ? Social Security Administration: May apply for disability if have a Stage IV cancer. (520)884-7443 539-495-2866 ? [employer], Disability and Transit Services: Assists with nutrition, care and transit needs. [place] Support Programs: @10RELATIVEDAYS @ > Cancer Support Group  2nd Tuesday of the month 1pm-2pm, Journey Room  > Creative Journey  3rd Tuesday of the month 1130am-1pm, Journey Room  > Look Good Feel Better  1st Wednesday of the month 10am-12 noon, Journey Room (Call American Cancer Society to register [phone])  Pegfilgrastim injection What is this medicine? PEGFILGRASTIM (PEG fil gra stim) is a long-acting granulocyte colony-stimulating factor that stimulates the growth of neutrophils, a type of white blood cell important in the body's fight against infection. It is used to reduce the incidence of fever and infection in patients with certain types of cancer who are receiving chemotherapy that affects the bone marrow, and to increase survival after being exposed to high doses of radiation. This medicine may be used for other purposes; ask your health care provider or pharmacist if you have questions. What should I tell my health care provider before I take this medicine? They need to know if you have any of these conditions: -  kidney disease -latex allergy -ongoing radiation therapy -sickle cell disease -skin reactions to acrylic adhesives (On-Body Injector only) -an unusual or allergic reaction to pegfilgrastim, filgrastim,  other medicines, foods, dyes, or preservatives -pregnant or trying to get pregnant -breast-feeding How should I use this medicine? This medicine is for injection under the skin. If you get this medicine at home, you will be taught how to prepare and give the pre-filled syringe or how to use the On-body Injector. Refer to the patient Instructions for Use for detailed instructions. Use exactly as directed. Take your medicine at regular intervals. Do not take your medicine more often than directed. It is important that you put your used needles and syringes in a special sharps container. Do not put them in a trash can. If you do not have a sharps container, call your pharmacist or healthcare provider to get one. Talk to your pediatrician regarding the use of this medicine in children. While this drug may be prescribed for selected conditions, precautions do apply. Overdosage: If you think you have taken too much of this medicine contact a poison control center or emergency room at once. NOTE: This medicine is only for you. Do not share this medicine with others. What if I miss a dose? It is important not to miss your dose. Call your doctor or health care professional if you miss your dose. If you miss a dose due to an On-body Injector failure or leakage, a new dose should be administered as soon as possible using a single prefilled syringe for manual use. What may interact with this medicine? Interactions have not been studied. Give your health care provider a list of all the medicines, herbs, non-prescription drugs, or dietary supplements you use. Also tell them if you smoke, drink alcohol, or use illegal drugs. Some items may interact with your medicine. This list may not describe all possible interactions. Give your health care provider a list of all the medicines, herbs, non-prescription drugs, or dietary supplements you use. Also tell them if you smoke, drink alcohol, or use illegal drugs. Some items  may interact with your medicine. What should I watch for while using this medicine? You may need blood work done while you are taking this medicine. If you are going to need a MRI, CT scan, or other procedure, tell your doctor that you are using this medicine (On-Body Injector only). What side effects may I notice from receiving this medicine? Side effects that you should report to your doctor or health care professional as soon as possible: -allergic reactions like skin rash, itching or hives, swelling of the face, lips, or tongue -dizziness -fever -pain, redness, or irritation at site where injected -pinpoint red spots on the skin -red or dark-brown urine -shortness of breath or breathing problems -stomach or side pain, or pain at the shoulder -swelling -tiredness -trouble passing urine or change in the amount of urine Side effects that usually do not require medical attention (report to your doctor or health care professional if they continue or are bothersome): -bone pain -muscle pain This list may not describe all possible side effects. Call your doctor for medical advice about side effects. You may report side effects to FDA at 1-800-FDA-1088. Where should I keep my medicine? Keep out of the reach of children. Store pre-filled syringes in a refrigerator between 2 and 8 degrees C (36 and 46 degrees F). Do not freeze. Keep in carton to protect from light. Throw away this medicine if it is left  out of the refrigerator for more than 48 hours. Throw away any unused medicine after the expiration date. NOTE: This sheet is a summary. It may not cover all possible information. If you have questions about this medicine, talk to your doctor, pharmacist, or health care provider.    2016, Elsevier/Gold Standard. (2014-11-21 14:30:14)

## 2016-07-02 ENCOUNTER — Encounter (HOSPITAL_BASED_OUTPATIENT_CLINIC_OR_DEPARTMENT_OTHER): Payer: Medicare Other

## 2016-07-02 ENCOUNTER — Ambulatory Visit (HOSPITAL_COMMUNITY): Payer: Medicare Other

## 2016-07-02 ENCOUNTER — Encounter (HOSPITAL_COMMUNITY): Payer: Medicare Other

## 2016-07-02 VITALS — BP 105/51 | HR 78 | Temp 98.1°F | Resp 18 | Wt 152.0 lb

## 2016-07-02 DIAGNOSIS — Z5112 Encounter for antineoplastic immunotherapy: Secondary | ICD-10-CM | POA: Diagnosis not present

## 2016-07-02 DIAGNOSIS — D649 Anemia, unspecified: Secondary | ICD-10-CM

## 2016-07-02 DIAGNOSIS — D61818 Other pancytopenia: Secondary | ICD-10-CM | POA: Diagnosis not present

## 2016-07-02 DIAGNOSIS — C8300 Small cell B-cell lymphoma, unspecified site: Secondary | ICD-10-CM | POA: Diagnosis not present

## 2016-07-02 DIAGNOSIS — C880 Waldenstrom macroglobulinemia: Secondary | ICD-10-CM

## 2016-07-02 MED ORDER — SODIUM CHLORIDE 0.9 % IV SOLN
20.0000 mg | Freq: Once | INTRAVENOUS | Status: AC
  Administered 2016-07-02: 20 mg via INTRAVENOUS
  Filled 2016-07-02: qty 2

## 2016-07-02 MED ORDER — SODIUM CHLORIDE 0.9 % IV SOLN: 375.0000 mg/m2 | Freq: Once | INTRAVENOUS | Status: DC

## 2016-07-02 MED ORDER — SODIUM CHLORIDE 0.9 % IV SOLN
250.0000 mL | Freq: Once | INTRAVENOUS | Status: AC
  Administered 2016-07-02: 250 mL via INTRAVENOUS

## 2016-07-02 MED ORDER — HEPARIN SOD (PORK) LOCK FLUSH 100 UNIT/ML IV SOLN
INTRAVENOUS | Status: AC
  Filled 2016-07-02: qty 5

## 2016-07-02 MED ORDER — HEPARIN SOD (PORK) LOCK FLUSH 100 UNIT/ML IV SOLN
500.0000 [IU] | Freq: Once | INTRAVENOUS | Status: AC | PRN
  Administered 2016-07-02: 500 [IU]
  Filled 2016-07-02: qty 5

## 2016-07-02 MED ORDER — ACETAMINOPHEN 325 MG PO TABS
650.0000 mg | ORAL_TABLET | Freq: Once | ORAL | Status: AC
  Administered 2016-07-02: 650 mg via ORAL
  Filled 2016-07-02: qty 2

## 2016-07-02 MED ORDER — SODIUM CHLORIDE 0.9% FLUSH
10.0000 mL | INTRAVENOUS | Status: DC | PRN
  Administered 2016-07-02: 10 mL
  Filled 2016-07-02: qty 10

## 2016-07-02 MED ORDER — SODIUM CHLORIDE 0.9 % IV SOLN
375.0000 mg/m2 | Freq: Once | INTRAVENOUS | Status: AC
  Administered 2016-07-02: 700 mg via INTRAVENOUS
  Filled 2016-07-02: qty 50

## 2016-07-02 MED ORDER — SODIUM CHLORIDE 0.9 % IV SOLN
Freq: Once | INTRAVENOUS | Status: AC
  Administered 2016-07-02: 10:00:00 via INTRAVENOUS

## 2016-07-02 MED ORDER — DIPHENHYDRAMINE HCL 25 MG PO CAPS
50.0000 mg | ORAL_CAPSULE | Freq: Once | ORAL | Status: AC
  Administered 2016-07-02: 50 mg via ORAL
  Filled 2016-07-02: qty 2

## 2016-07-02 NOTE — Patient Instructions (Signed)
[facility] Discharge Instructions for Patients Receiving Chemotherapy   Beginning January 23rd 2017 lab work for the [facility] will be done in the  Main lab at [facility] on 1st floor. If you have a lab appointment with the [facility] please come in thru the  Main Entrance and check in at the main information desk   Today you received the following chemotherapy agents Rituxan and 1 unit of PRBC'S. Follow-up as scheduled. Call clinic for any questions or concerns  To help prevent nausea and vomiting after your treatment, we encourage you to take your nausea medication   If you develop nausea and vomiting, or diarrhea that is not controlled by your medication, call the clinic.  The clinic phone number is [phone]. Office hours are Monday-Friday 8:30am-5:00pm.  BELOW ARE SYMPTOMS THAT SHOULD BE REPORTED IMMEDIATELY:  *FEVER GREATER THAN 101.0 F  *CHILLS WITH OR WITHOUT FEVER  NAUSEA AND VOMITING THAT IS NOT CONTROLLED WITH YOUR NAUSEA MEDICATION  *UNUSUAL SHORTNESS OF BREATH  *UNUSUAL BRUISING OR BLEEDING  TENDERNESS IN MOUTH AND THROAT WITH OR WITHOUT PRESENCE OF ULCERS  *URINARY PROBLEMS  *BOWEL PROBLEMS  UNUSUAL RASH Items with * indicate a potential emergency and should be followed up as soon as possible. If you have an emergency after office hours please contact your primary care physician or go to the nearest emergency department.  Please call the clinic during office hours if you have any questions or concerns.   You may also contact the Patient Navigator at [phone] should you have any questions or need assistance in obtaining follow up care.      Resources For Cancer Patients and their Caregivers ? American Cancer Society: Can assist with transportation, wigs, general needs, runs Look Good Feel Better.        757-315-5749 ? Cancer Care: Provides financial assistance, online support groups, medication/co-pay assistance.   1-800-813-HOPE 865-715-8649) ? [facility] Assists [place] cancer patients and their families through emotional , educational and financial support.  289-109-3169 ? [place] DSS Where to apply for food stamps, Medicaid and utility assistance. (727)485-6266 ? RCATS: Transportation to medical appointments. 2030265360 ? Social Security Administration: May apply for disability if have a Stage IV cancer. 913-405-4051 (402)[phone] ? [employer], Disability and Transit Services: Assists with nutrition, care and transit needs. [phone]

## 2016-07-02 NOTE — Progress Notes (Signed)
Paul Keith tolerated chemo tx well without complaints as well as 1 unit of PRBC'S. Vital signs remained stable throughout infusions. Pt discharged self ambulatory in satisfactory condition with wife

## 2016-07-03 ENCOUNTER — Ambulatory Visit (HOSPITAL_COMMUNITY): Payer: Medicare Other

## 2016-07-03 LAB — TYPE AND SCREEN
ABO/RH(D): O POS
Antibody Screen: NEGATIVE
UNIT DIVISION: 0

## 2016-07-04 ENCOUNTER — Ambulatory Visit (HOSPITAL_COMMUNITY): Payer: Medicare Other

## 2016-07-05 ENCOUNTER — Ambulatory Visit (HOSPITAL_COMMUNITY): Payer: Medicare Other

## 2016-07-06 ENCOUNTER — Ambulatory Visit (HOSPITAL_COMMUNITY): Payer: Medicare Other

## 2016-07-07 ENCOUNTER — Ambulatory Visit (HOSPITAL_COMMUNITY): Payer: Medicare Other

## 2016-07-09 ENCOUNTER — Encounter (HOSPITAL_BASED_OUTPATIENT_CLINIC_OR_DEPARTMENT_OTHER): Payer: Medicare Other | Admitting: Hematology & Oncology

## 2016-07-09 ENCOUNTER — Encounter (HOSPITAL_BASED_OUTPATIENT_CLINIC_OR_DEPARTMENT_OTHER): Payer: Medicare Other

## 2016-07-09 ENCOUNTER — Other Ambulatory Visit (HOSPITAL_COMMUNITY): Payer: Self-pay | Admitting: Oncology

## 2016-07-09 ENCOUNTER — Telehealth (HOSPITAL_COMMUNITY): Payer: Self-pay | Admitting: Oncology

## 2016-07-09 VITALS — BP 109/56 | HR 85 | Temp 97.4°F | Resp 16 | Wt 153.6 lb

## 2016-07-09 DIAGNOSIS — E790 Hyperuricemia without signs of inflammatory arthritis and tophaceous disease: Secondary | ICD-10-CM

## 2016-07-09 DIAGNOSIS — C8300 Small cell B-cell lymphoma, unspecified site: Secondary | ICD-10-CM

## 2016-07-09 DIAGNOSIS — D472 Monoclonal gammopathy: Secondary | ICD-10-CM | POA: Diagnosis not present

## 2016-07-09 DIAGNOSIS — C880 Waldenstrom macroglobulinemia: Secondary | ICD-10-CM

## 2016-07-09 DIAGNOSIS — R161 Splenomegaly, not elsewhere classified: Secondary | ICD-10-CM

## 2016-07-09 DIAGNOSIS — D709 Neutropenia, unspecified: Secondary | ICD-10-CM | POA: Diagnosis not present

## 2016-07-09 DIAGNOSIS — K5780 Diverticulitis of intestine, part unspecified, with perforation and abscess without bleeding: Secondary | ICD-10-CM

## 2016-07-09 DIAGNOSIS — D61818 Other pancytopenia: Secondary | ICD-10-CM

## 2016-07-09 DIAGNOSIS — Z5112 Encounter for antineoplastic immunotherapy: Secondary | ICD-10-CM

## 2016-07-09 DIAGNOSIS — Z9189 Other specified personal risk factors, not elsewhere classified: Secondary | ICD-10-CM

## 2016-07-09 DIAGNOSIS — K5790 Diverticulosis of intestine, part unspecified, without perforation or abscess without bleeding: Secondary | ICD-10-CM

## 2016-07-09 LAB — COMPREHENSIVE METABOLIC PANEL
ALBUMIN: 2.7 g/dL — AB (ref 3.5–5.0)
ALK PHOS: 77 U/L (ref 38–126)
ALT: 10 U/L — AB (ref 17–63)
ANION GAP: 7 (ref 5–15)
AST: 15 U/L (ref 15–41)
BUN: 21 mg/dL — ABNORMAL HIGH (ref 6–20)
CALCIUM: 8.9 mg/dL (ref 8.9–10.3)
CO2: 26 mmol/L (ref 22–32)
CREATININE: 1.34 mg/dL — AB (ref 0.61–1.24)
Chloride: 100 mmol/L — ABNORMAL LOW (ref 101–111)
GFR calc Af Amer: 54 mL/min — ABNORMAL LOW (ref >60)
GFR calc non Af Amer: 47 mL/min — ABNORMAL LOW (ref >60)
GLUCOSE: 99 mg/dL (ref 65–99)
Potassium: 4.3 mmol/L (ref 3.5–5.1)
SODIUM: 133 mmol/L — AB (ref 135–145)
Total Bilirubin: 0.7 mg/dL (ref 0.3–1.2)
Total Protein: 7.3 g/dL (ref 6.5–8.1)

## 2016-07-09 LAB — CBC WITH DIFFERENTIAL/PLATELET
BASOS ABS: 0 10*3/uL (ref 0.0–0.1)
Basophils Relative: 0 %
EOS ABS: 0.1 10*3/uL (ref 0.0–0.7)
Eosinophils Relative: 1 %
HCT: 26.1 % — ABNORMAL LOW (ref 39.0–52.0)
HEMOGLOBIN: 8.3 g/dL — AB (ref 13.0–17.0)
LYMPHS PCT: 24 %
Lymphs Abs: 1.2 10*3/uL (ref 0.7–4.0)
MCH: 27 pg (ref 26.0–34.0)
MCHC: 31.8 g/dL (ref 30.0–36.0)
MCV: 85 fL (ref 78.0–100.0)
Monocytes Absolute: 0.3 10*3/uL (ref 0.1–1.0)
Monocytes Relative: 6 %
NEUTROS PCT: 69 %
Neutro Abs: 3.6 10*3/uL (ref 1.7–7.7)
Platelets: 67 10*3/uL — ABNORMAL LOW (ref 150–400)
RBC: 3.07 MIL/uL — ABNORMAL LOW (ref 4.22–5.81)
RDW: 18 % — ABNORMAL HIGH (ref 11.5–15.5)
WBC: 5.2 10*3/uL (ref 4.0–10.5)

## 2016-07-09 LAB — PHOSPHORUS: Phosphorus: 3.1 mg/dL (ref 2.5–4.6)

## 2016-07-09 LAB — MAGNESIUM: Magnesium: 2 mg/dL (ref 1.7–2.4)

## 2016-07-09 LAB — LACTATE DEHYDROGENASE: LDH: 152 U/L (ref 98–192)

## 2016-07-09 LAB — URIC ACID: Uric Acid, Serum: 8.1 mg/dL — ABNORMAL HIGH (ref 4.4–7.6)

## 2016-07-09 MED ORDER — ALLOPURINOL 300 MG PO TABS: 300.0000 mg | ORAL_TABLET | Freq: Two times a day (BID) | ORAL | 1 refills | Status: DC

## 2016-07-09 MED ORDER — SODIUM CHLORIDE 0.9 % IV SOLN
Freq: Once | INTRAVENOUS | Status: AC
  Administered 2016-07-09: 11:00:00 via INTRAVENOUS

## 2016-07-09 MED ORDER — ACETAMINOPHEN 325 MG PO TABS
650.0000 mg | ORAL_TABLET | Freq: Once | ORAL | Status: AC
  Administered 2016-07-09: 650 mg via ORAL
  Filled 2016-07-09: qty 2

## 2016-07-09 MED ORDER — HEPARIN SOD (PORK) LOCK FLUSH 100 UNIT/ML IV SOLN
500.0000 [IU] | Freq: Once | INTRAVENOUS | Status: AC | PRN
  Administered 2016-07-09: 500 [IU]
  Filled 2016-07-09: qty 5

## 2016-07-09 MED ORDER — SODIUM CHLORIDE 0.9% FLUSH
10.0000 mL | INTRAVENOUS | Status: DC | PRN
  Administered 2016-07-09: 10 mL
  Filled 2016-07-09: qty 10

## 2016-07-09 MED ORDER — SODIUM CHLORIDE 0.9 % IV SOLN
700.0000 mg | Freq: Once | INTRAVENOUS | Status: AC
  Administered 2016-07-09: 700 mg via INTRAVENOUS
  Filled 2016-07-09: qty 50

## 2016-07-09 MED ORDER — DEXAMETHASONE SODIUM PHOSPHATE 100 MG/10ML IJ SOLN
20.0000 mg | Freq: Once | INTRAMUSCULAR | Status: AC
  Administered 2016-07-09: 20 mg via INTRAVENOUS
  Filled 2016-07-09: qty 2

## 2016-07-09 MED ORDER — DIPHENHYDRAMINE HCL 25 MG PO CAPS
50.0000 mg | ORAL_CAPSULE | Freq: Once | ORAL | Status: AC
  Administered 2016-07-09: 50 mg via ORAL
  Filled 2016-07-09: qty 2

## 2016-07-09 NOTE — Progress Notes (Signed)
[name]      HEMATOLOGY/ONCOLOGY PROGRESS NOTE  Date of Service: 07/09/2016  Patient Care Team: Octavio Graves, DO as PCP - General  CHIEF COMPLAINTS:  NHL Pancytopenia BMBX on 05/06/2016 with extensive involvement by NHL B cell, admixed plasma cell component to a lesser extent which also shows kappa light chain restriction. DDX lymphoplasmacytic lymphoma, marginal zone lymphoma, follicular lymphoma with plasma cell differentiation, splenic lymphoma. Low grade process is favored.    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 The pattern is somewhat nonspecific but the findings are consistent with non-Hodgkin B-cell lymphoma.      05/10/2016 Pathology Results    Bone Marrow, Aspirate,Biopsy, and Clot, right ilium - HYPERCELLULAR BONE MARROW WITH EXTENSIVE INVOLVEMENT BY NON-HODGKIN'S B-CELL LYMPHOMA.      05/13/2016 PET scan    Splenomegaly and mild diffuse increase metabolic activity in the spleen consistent with lymphomatous involvement. Diffuse osseous uptake likely due to diffuse lymphoma. Recommend correlation with any marrow stimulating drugs or rebound phenomenon.      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 Adjacent small amount of fluid has decreased since prior examination with interval clearing of previously noted small amount of extraluminal air.      06/03/2016 -  Antibody Plan    Single-agent Rituxan       HISTORY OF PRESENTING ILLNESS:  Paul Keith is a wonderful 82 y.o. male who has been referred to [place] by Dr .Octavio Graves, DO for evaluation and management of pancytopenia. Bone marrow involvement shows extensive involvement with NHL. Ddx includes lymphoplasmacytic  lymphoma, marginal zone lymphoma, follicular lymphoma with plasma cell differentiation, splenic lymphoma. Course has been complicated by diverticulitis and diverticular abscess.   Paul Keith is accompanied by his wife and in treatment chair. He is here for Cycle #4 Rituximab today.  Some days he feels pretty good. Yesterday he felt good and walked a lot. This morning he got up and his legs hurt a lot. Later remarks, there are some days he feels really good - he still doesn't have full get up and go but he feels good. His wife mentions he becomes tired easily when they do get up and go.  While discussing his weight gain of 2 lbs, his wife states that she has been feeding him well. The patient admits he has been eating pretty good.  He reports one day his lower leg "gave me fits". He describes this as his lower leg / foot feeling numb. He believes it was secondary to crossing his legs while reading a book. This numbness has since resolved after actively not crossing his legs.   He reports diarrhea that wakes him up at night, but notes this is intermittent and not a nightly problem.   He is curious whether he can eat potato chips on his restricted diet. He was told not to, but still questions it, as well as if he can eat french fries with the skin off.   He did not experience any bone pain after receiving his neulasta shot.  He was approved for Neupogen and Neulasta by his insurance. He notes he received a formal letter in the mail.   His granddaughter is getting married the  first weekend of November and he would like to be able to attend. He also has a boat trip to visit about 8 different countries scheduled after that. He has formally cancelled his trip to [place].   MEDICAL HISTORY:  Past Medical History:  Diagnosis Date  . Anemia 04/22/2016  . Diverticulosis    pt reports recent admission for diverticulosis perforation this past week  . 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 Pancytopenia ([place]) 04/22/2016  BPH L4-L5 fracture 6 weeks ago Vasomotor rhinitis for several years.  SURGICAL HISTORY: Past Surgical History:  Procedure Laterality Date  . CATARACT EXTRACTION Bilateral   . TONSILLECTOMY      SOCIAL HISTORY: Social History   Social History  . Marital status: Married    Spouse name: N/A  . Number of children: N/A  . Years of education: N/A   Occupational History  . Not on file.   Social History Main Topics  . Smoking status: Former Smoker    Quit date: 09/15/1962  . Smokeless tobacco: Never Used  . Alcohol use Yes     Comment: OCCASSIONAL  . Drug use: No  . Sexual activity: Not on file   Other Topics Concern  . Not on file   Social History Narrative  . No narrative on file    FAMILY HISTORY: No family history on file.  ALLERGIES:  has No Known Allergies.  MEDICATIONS:  Current Outpatient Prescriptions  Medication Sig Dispense Refill  . cyanocobalamin 100 MCG tablet Take by mouth daily. Pt reports unsure of dosage, takes once daily    . furosemide (LASIX) 20 MG tablet Take 20 mg by mouth daily.    . RiTUXimab (RITUXAN IV) Inject into the vein.    . tamsulosin (FLOMAX) 0.4 MG CAPS capsule Take 0.4 mg by mouth.    . traMADol (ULTRAM) 50 MG tablet   0   No current facility-administered medications for this visit.     REVIEW OF SYSTEMS:   Positive for weakness and malaise/fatigue. Positive for diarrhea. 14 point review of systems was performed and is negative except as detailed under history of present illness and above   PHYSICAL EXAMINATION: ECOG PERFORMANCE STATUS: 1 - Symptomatic but completely ambulatory Vitals with BMI 07/09/2016  Height   Weight 153 lbs 10 oz  BMI   Systolic 891  Diastolic 55  Pulse 82  Respirations 18    GENERAL:alert, in no acute distress and comfortable SKIN: skin color, texture, turgor are normal, no rashes or significant lesions EYES:  normal, conjunctiva are pink and non-injected, sclera clear OROPHARYNX:no exudate, no erythema and lips, buccal mucosa, and tongue normal  NECK: supple, no JVD, thyroid normal size, non-tender, without nodularity LYMPH:  no palpable lymphadenopathy in the cervical, axillary or inguinal LUNGS: clear to auscultation with normal respiratory effort HEART: regular rate & rhythm,  no murmurs and no lower extremity edema ABDOMEN: abdomen soft, non-tender, normoactive bowel sounds no overt palpable hepatosplenomegaly Musculoskeletal: no cyanosis of digits and no clubbing  PSYCH: alert & oriented x 3 with fluent speech NEURO: no focal motor/sensory deficits  LABORATORY DATA:  I have reviewed the data as listed Results for BRANSTON, HALSTED (MRN [id number]) as of 07/09/2016 10:41 Results for ASLAN, HIMES (MRN [id number]) as of 08/01/2016 14:37  Ref. Range 07/09/2016 08:54  Sodium Latest Ref Range: 135 - 145 mmol/L 133 (L)  Potassium Latest Ref Range: 3.5 - 5.1 mmol/L 4.3  Chloride Latest Ref Range: 101 -  111 mmol/L 100 (L)  CO2 Latest Ref Range: 22 - 32 mmol/L 26  BUN Latest Ref Range: 6 - 20 mg/dL 21 (H)  Creatinine Latest Ref Range: 0.61 - 1.24 mg/dL 1.34 (H)  Calcium Latest Ref Range: 8.9 - 10.3 mg/dL 8.9  EGFR (Non-African Amer.) Latest Ref Range: >60 mL/min 47 (L)  EGFR (African American) Latest Ref Range: >60 mL/min 54 (L)  Glucose Latest Ref Range: 65 - 99 mg/dL 99  Anion gap Latest Ref Range: 5 - 15  7  Phosphorus Latest Ref Range: 2.5 - 4.6 mg/dL 3.1  Magnesium Latest Ref Range: 1.7 - 2.4 mg/dL 2.0  Alkaline Phosphatase Latest Ref Range: 38 - 126 U/L 77  Albumin Latest Ref Range: 3.5 - 5.0 g/dL 2.7 (L)  Uric Acid, Serum Latest Ref Range: 4.4 - 7.6 mg/dL 8.1 (H)  AST Latest Ref Range: 15 - 41 U/L 15  ALT Latest Ref Range: 17 - 63 U/L 10 (L)  Total Protein Latest Ref Range: 6.5 - 8.1 g/dL 7.3  Total Bilirubin Latest Ref Range: 0.3 - 1.2 mg/dL 0.7  LDH Latest Ref Range: 98 - 192 U/L 152   WBC Latest Ref Range: 4.0 - 10.5 K/uL 5.2  RBC Latest Ref Range: 4.22 - 5.81 MIL/uL 3.07 (L)  Hemoglobin Latest Ref Range: 13.0 - 17.0 g/dL 8.3 (L)  HCT Latest Ref Range: 39.0 - 52.0 % 26.1 (L)  MCV Latest Ref Range: 78.0 - 100.0 fL 85.0  MCH Latest Ref Range: 26.0 - 34.0 pg 27.0  MCHC Latest Ref Range: 30.0 - 36.0 g/dL 31.8  RDW Latest Ref Range: 11.5 - 15.5 % 18.0 (H)  Platelets Latest Ref Range: 150 - 400 K/uL 67 (L)  Neutrophils Latest Units: % 69  Lymphocytes Latest Units: % 24  Monocytes Relative Latest Units: % 6  Eosinophil Latest Units: % 1  Basophil Latest Units: % 0  NEUT# Latest Ref Range: 1.7 - 7.7 K/uL 3.6  Lymphocyte # Latest Ref Range: 0.7 - 4.0 K/uL 1.2  Monocyte # Latest Ref Range: 0.1 - 1.0 K/uL 0.3  Eosinophils Absolute Latest Ref Range: 0.0 - 0.7 K/uL 0.1  Basophils Absolute Latest Ref Range: 0.0 - 0.1 K/uL 0.0  WBC Morphology Unknown ATYPICAL LYMPHOCYTES                 RADIOGRAPHIC STUDIES: I have personally reviewed the radiological images as listed and agreed with the findings in the report.  Study Result   CLINICAL DATA:  82 year old male with perforation of the tests and several weeks ago. Chronic lymphocytic leukemia. Anemia.  EXAM: CT ABDOMEN AND PELVIS WITHOUT CONTRAST  TECHNIQUE: Multidetector CT imaging of the abdomen and pelvis was performed following the standard protocol without IV contrast.  COMPARISON:  05/16/2016 CT.  05/13/2016 PET-CT.  FINDINGS: Lower chest:  Negative.  Hepatobiliary: Unenhanced imaging without abnormality.  Pancreas: Unenhanced imaging without abnormality.  Spleen: Marked splenomegaly once again noted.  Adrenals/Urinary Tract: Unenhanced imaging without abnormality noted.  Stomach/Bowel: Circumferential diffuse thickening of the mid to distal sigmoid colon which contains small number of diverticula. Adjacent small amount of fluid remains. Amount of fluid has decreased since prior  examination with interval clearing of previously noted small amount of extraluminal air. It is possible that this represents diverticulitis which has improved but incompletely cleared. Underlying mass whether related to primary malignancy or lymphoma of the sigmoid colon not excluded given the fact that this region was hypermetabolic on recent PET-CT.  Shifting fluid within the upper quadrant  adjacent to the duodenum/jejunum. No primary small bowel abnormality detected as a source of this fluid which may represent fluid from the pelvic inflammatory process shifting superiorly. Under distended stomach with limited evaluation without gross abnormality.  Vascular/Lymphatic: Calcified plaque aorta and iliac arteries without aneurysmal dilation. No adenopathy detected.  Reproductive: Enlarged prostate gland.  Other: As above.  Musculoskeletal: Schmorl's node deformity superior endplate L4. Schmorl's node deformity/superior endplate compression fracture L5. Findings unchanged. Questionable diffuse osseous involvement by lymphoma (noted on PET-CT) not appreciated by CT.  IMPRESSION: Circumferential diffuse thickening of the mid to distal sigmoid colon. Adjacent small amount of fluid has decreased since prior examination with interval clearing of previously noted small amount of extraluminal air. It is possible that this represents diverticulitis which has improved but incompletely cleared. Underlying mass whether related to primary malignancy or lymphoma of the sigmoid colon not excluded given the fact that this region was hypermetabolic on recent PET-CT.  Shifting fluid within the upper quadrant adjacent to the duodenum/jejunum. No primary small bowel abnormality detected as a source of this fluid which may represent fluid from the pelvic inflammatory process shifting superiorly.  Marked splenomegaly once again noted.  Enlarged prostate gland.  Aortic  atherosclerosis.   Electronically Signed   By: Genia Del M.D.   On: 06/01/2016 09:38    No results found.  ASSESSMENT & PLAN:  NHL, low grade, heavy marrow involvement Pancytopenia with neutropenia IgM Kappa monoclonal gammopathy L4-L5 vertebral fracture Splenomegaly secondary to NHL Diffuse osseous uptake on PET secondary to NHL Recent diverticulitis  Difficult situation given his low white count from his NHL and his diverticular disease. Discussed with the patient and his family. I have recommended proceeding with rituxan. Not usually a drug we associate with neutropenia. He has a pre-existing neutropenia from his marrow involvement.We discussed NHL in detail.  At some point, we may need to consider adding bendamustine. I am concerned that given his heavy marrow involvement and splenomegaly this will be needed but clearly not until his bowel issues are fully resolved.   L4-L5 recent fracture with no significant trauma . Concern for pathologic fracture . ? Lymphomatous involvement of the spine Given PET results most likely the case. Patient has currently no concerning neurological symptoms are uncontrolled back pain at this time.  The patient is here for Cycle #4 Rituximab today. We will plan for 6 total treatment cycles.  Labs reviewed. ANC today is excellent. He received neulasta on 07/01/2016.  RTC 2 weeks. Continue with Rituxan. He will continue to follow with general surgery in regards to his diverticular abscess/diverticular disease.  Orders Placed This Encounter  Procedures  . CBC with Differential    Standing Status:   Future    Number of Occurrences:   1    Standing Expiration Date:   07/09/2017  . Comprehensive metabolic panel    Standing Status:   Future    Number of Occurrences:   1    Standing Expiration Date:   07/09/2017  . Magnesium    Standing Status:   Future    Number of Occurrences:   1    Standing Expiration Date:   07/09/2017  . Lactate  dehydrogenase    Standing Status:   Future    Number of Occurrences:   1    Standing Expiration Date:   07/09/2017  . Phosphorus    Standing Status:   Future    Number of Occurrences:   1    Standing Expiration Date:  07/09/2017  . Uric acid    Standing Status:   Future    Number of Occurrences:   1    Standing Expiration Date:   07/09/2017  . CBC with Differential    Standing Status:   Future    Number of Occurrences:   1    Standing Expiration Date:   07/09/2017  . Comprehensive metabolic panel    Standing Status:   Future    Number of Occurrences:   1    Standing Expiration Date:   07/09/2017  . Magnesium    Standing Status:   Future    Number of Occurrences:   1    Standing Expiration Date:   07/09/2017  . Lactate dehydrogenase    Standing Status:   Future    Number of Occurrences:   1    Standing Expiration Date:   07/09/2017  . Phosphorus    Standing Status:   Future    Number of Occurrences:   1    Standing Expiration Date:   07/09/2017  . Uric acid    Standing Status:   Future    Number of Occurrences:   1    Standing Expiration Date:   07/09/2017    All of the patients and his family's questions were answered in details to their apparent satisfaction. The patient knows to call the clinic with any problems, questions or concerns.  This document serves as a record of services personally performed by Ancil Linsey, MD. It was created on her behalf by Arlyce Harman, a trained medical scribe. The creation of this record is based on the scribe's personal observations and the provider's statements to them. This document has been checked and approved by the attending provider.  I have reviewed the above documentation for accuracy and completeness, and I agree with the above.  Kelby Fam. Sheela Mcculley, MD  07/09/2016 8:22 AM

## 2016-07-09 NOTE — Patient Instructions (Signed)
[facility] Discharge Instructions for Patients Receiving Chemotherapy   Beginning January 23rd 2017 lab work for the Cancer Center will be done in the  Main lab at [facility] on 1st floor. If you have a lab appointment with the Cancer Center please come in thru the  Main Entrance and check in at the main information desk   Today you received the following chemotherapy agents Rituxan. Follow-up as scheduled. Call clinic for any questions or concerns  To help prevent nausea and vomiting after your treatment, we encourage you to take your nausea medication   If you develop nausea and vomiting, or diarrhea that is not controlled by your medication, call the clinic.  The clinic phone number is [phone]. Office hours are Monday-Friday 8:30am-5:00pm.  BELOW ARE SYMPTOMS THAT SHOULD BE REPORTED IMMEDIATELY:  *FEVER GREATER THAN 101.0 F  *CHILLS WITH OR WITHOUT FEVER  NAUSEA AND VOMITING THAT IS NOT CONTROLLED WITH YOUR NAUSEA MEDICATION  *UNUSUAL SHORTNESS OF BREATH  *UNUSUAL BRUISING OR BLEEDING  TENDERNESS IN MOUTH AND THROAT WITH OR WITHOUT PRESENCE OF ULCERS  *URINARY PROBLEMS  *BOWEL PROBLEMS  UNUSUAL RASH Items with * indicate a potential emergency and should be followed up as soon as possible. If you have an emergency after office hours please contact your primary care physician or go to the nearest emergency department.  Please call the clinic during office hours if you have any questions or concerns.   You may also contact the Patient Navigator at [phone] should you have any questions or need assistance in obtaining follow up care.      Resources For Cancer Patients and their Caregivers ? American Cancer Society: Can assist with transportation, wigs, general needs, runs Look Good Feel Better.        1-888-227-6333 ? Cancer Care: Provides financial assistance, online support groups, medication/co-pay assistance.  1-800-813-HOPE  (4673) ? [facility] Assists [place] cancer patients and their families through emotional , educational and financial support.  336-427-4357 ? [place] DSS Where to apply for food stamps, Medicaid and utility assistance. 336-342-1394 ? RCATS: Transportation to medical appointments. 336-347-2287 ? Social Security Administration: May apply for disability if have a Stage IV cancer. 336-342-7796 1-800-772-1213 ? [place] Aging, Disability and Transit Services: Assists with nutrition, care and transit needs. [phone]         

## 2016-07-09 NOTE — Patient Instructions (Signed)
[place] at [facility] Discharge Instructions  RECOMMENDATIONS MADE BY THE CONSULTANT AND ANY TEST RESULTS WILL BE SENT TO YOUR REFERRING PHYSICIAN.  You saw Dr. Whitney Muse today. Return for labs and treatment in one week. Return for labs, follow up with MD and treatment in 2 weeks.  Thank you for choosing [place] at [facility] to provide your oncology and hematology care.  To afford each patient quality time with our provider, please arrive at least 15 minutes before your scheduled appointment time.   Beginning January 23rd 2017 lab work for the [name] will be done in the  Main lab at [facility] on 1st floor. If you have a lab appointment with the [facility] please come in thru the  Main Entrance and check in at the main information desk  You need to re-schedule your appointment should you arrive 10 or more minutes late.  We strive to give you quality time with our providers, and arriving late affects you and other patients whose appointments are after yours.  Also, if you no show three or more times for appointments you may be dismissed from the clinic at the providers discretion.     Again, thank you for choosing [facility].  Our hope is that these requests will decrease the amount of time that you wait before being seen by our physicians.       _____________________________________________________________  Should you have questions after your visit to [facility], please contact our office at [phone] between the hours of 8:30 a.m. and 4:30 p.m.  Voicemails left after 4:30 p.m. will not be returned until the following business day.  For prescription refill requests, have your pharmacy contact our office.         Resources For Cancer Patients and their Caregivers ? American Cancer Society: Can assist with transportation, wigs, general needs, runs Look Good Feel Better.         612-564-4358 ? Cancer Care: Provides financial assistance, online support groups, medication/co-pay assistance.  1-800-813-HOPE (334)146-3056) ? [facility] Assists [place] cancer patients and their families through emotional , educational and financial support.  240-867-0720 ? [place] DSS Where to apply for food stamps, Medicaid and utility assistance. (260) 206-8006 ? RCATS: Transportation to medical appointments. (770) 784-8099 ? Social Security Administration: May apply for disability if have a Stage IV cancer. (236) [phone] 2395747209 ? [employer], Disability and Transit Services: Assists with nutrition, care and transit needs. [place] Support Programs: @10RELATIVEDAYS @ > Cancer Support Group  2nd Tuesday of the month 1pm-2pm, Journey Room  > Creative Journey  3rd Tuesday of the month 1130am-1pm, Journey Room  > Look Good Feel Better  1st Wednesday of the month 10am-12 noon, Journey Room (Call [facility] to register [phone])

## 2016-07-09 NOTE — Progress Notes (Signed)
Paul Keith tolerated chemo tx well without complaints. Pt instructed to call next week for any increase in weakness,SOB or chest pain since his HGB was 8.3 today. Pt verbalized understanding.Pt seen by Dr. Whitney Muse today. VSS upon discharge Pt discharged self ambulatory in satisfactory condition with wife

## 2016-07-09 NOTE — Telephone Encounter (Signed)
Reviewed patient's lab work with him.  He is advised of his elevated uric acid level.  In response to that, I have escribed Allopurinol to his pharmacy and he is advised to pick this medication up tomorrow and start tomorrow.  All questions were answered.  Doy Mince 07/09/2016 6:34 PM

## 2016-07-10 ENCOUNTER — Encounter (HOSPITAL_COMMUNITY): Payer: Self-pay | Admitting: Hematology & Oncology

## 2016-07-13 ENCOUNTER — Encounter (HOSPITAL_COMMUNITY): Payer: Self-pay | Admitting: Hematology & Oncology

## 2016-07-13 ENCOUNTER — Other Ambulatory Visit (HOSPITAL_COMMUNITY): Payer: Self-pay | Admitting: Hematology & Oncology

## 2016-07-16 ENCOUNTER — Encounter (HOSPITAL_COMMUNITY): Payer: Medicare Other | Attending: Hematology & Oncology

## 2016-07-16 ENCOUNTER — Encounter (HOSPITAL_COMMUNITY): Payer: Medicare Other

## 2016-07-16 ENCOUNTER — Encounter (HOSPITAL_COMMUNITY): Payer: Self-pay

## 2016-07-16 VITALS — BP 118/83 | HR 78 | Temp 97.6°F | Resp 18 | Wt 154.2 lb

## 2016-07-16 DIAGNOSIS — D479 Neoplasm of uncertain behavior of lymphoid, hematopoietic and related tissue, unspecified: Secondary | ICD-10-CM | POA: Insufficient documentation

## 2016-07-16 DIAGNOSIS — Z5112 Encounter for antineoplastic immunotherapy: Secondary | ICD-10-CM

## 2016-07-16 DIAGNOSIS — D649 Anemia, unspecified: Secondary | ICD-10-CM | POA: Insufficient documentation

## 2016-07-16 DIAGNOSIS — D61818 Other pancytopenia: Secondary | ICD-10-CM | POA: Insufficient documentation

## 2016-07-16 DIAGNOSIS — C8300 Small cell B-cell lymphoma, unspecified site: Secondary | ICD-10-CM

## 2016-07-16 DIAGNOSIS — C880 Waldenstrom macroglobulinemia: Secondary | ICD-10-CM | POA: Insufficient documentation

## 2016-07-16 LAB — COMPREHENSIVE METABOLIC PANEL
ALT: 13 U/L — ABNORMAL LOW (ref 17–63)
ANION GAP: 4 — AB (ref 5–15)
AST: 18 U/L (ref 15–41)
Albumin: 2.9 g/dL — ABNORMAL LOW (ref 3.5–5.0)
Alkaline Phosphatase: 68 U/L (ref 38–126)
BUN: 22 mg/dL — AB (ref 6–20)
CHLORIDE: 101 mmol/L (ref 101–111)
CO2: 25 mmol/L (ref 22–32)
Calcium: 8.6 mg/dL — ABNORMAL LOW (ref 8.9–10.3)
Creatinine, Ser: 1.35 mg/dL — ABNORMAL HIGH (ref 0.61–1.24)
GFR calc Af Amer: 54 mL/min — ABNORMAL LOW (ref >60)
GFR, EST NON AFRICAN AMERICAN: 47 mL/min — AB (ref >60)
Glucose, Bld: 109 mg/dL — ABNORMAL HIGH (ref 65–99)
POTASSIUM: 4 mmol/L (ref 3.5–5.1)
Sodium: 130 mmol/L — ABNORMAL LOW (ref 135–145)
Total Bilirubin: 0.9 mg/dL (ref 0.3–1.2)
Total Protein: 7.9 g/dL (ref 6.5–8.1)

## 2016-07-16 LAB — CBC WITH DIFFERENTIAL/PLATELET
BASOS ABS: 0 10*3/uL (ref 0.0–0.1)
BASOS PCT: 0 %
EOS PCT: 2 %
Eosinophils Absolute: 0.1 10*3/uL (ref 0.0–0.7)
HCT: 25.2 % — ABNORMAL LOW (ref 39.0–52.0)
Hemoglobin: 7.9 g/dL — ABNORMAL LOW (ref 13.0–17.0)
LYMPHS PCT: 37 %
Lymphs Abs: 1.1 10*3/uL (ref 0.7–4.0)
MCH: 27.3 pg (ref 26.0–34.0)
MCHC: 31.3 g/dL (ref 30.0–36.0)
MCV: 87.2 fL (ref 78.0–100.0)
MONO ABS: 0.2 10*3/uL (ref 0.1–1.0)
Monocytes Relative: 8 %
Neutro Abs: 1.7 10*3/uL (ref 1.7–7.7)
Neutrophils Relative %: 54 %
PLATELETS: 76 10*3/uL — AB (ref 150–400)
RBC: 2.89 MIL/uL — ABNORMAL LOW (ref 4.22–5.81)
RDW: 19.5 % — AB (ref 11.5–15.5)
WBC: 3.1 10*3/uL — ABNORMAL LOW (ref 4.0–10.5)

## 2016-07-16 LAB — MAGNESIUM: MAGNESIUM: 2 mg/dL (ref 1.7–2.4)

## 2016-07-16 LAB — PREPARE RBC (CROSSMATCH)

## 2016-07-16 LAB — LACTATE DEHYDROGENASE: LDH: 293 U/L — AB (ref 98–192)

## 2016-07-16 LAB — PHOSPHORUS: PHOSPHORUS: 3.1 mg/dL (ref 2.5–4.6)

## 2016-07-16 LAB — URIC ACID: URIC ACID, SERUM: 3.5 mg/dL — AB (ref 4.4–7.6)

## 2016-07-16 MED ORDER — DEXAMETHASONE SODIUM PHOSPHATE 100 MG/10ML IJ SOLN
20.0000 mg | Freq: Once | INTRAMUSCULAR | Status: AC
  Administered 2016-07-16: 20 mg via INTRAVENOUS
  Filled 2016-07-16: qty 2

## 2016-07-16 MED ORDER — SODIUM CHLORIDE 0.9% FLUSH
10.0000 mL | INTRAVENOUS | Status: DC | PRN
  Administered 2016-07-16: 10 mL
  Filled 2016-07-16: qty 10

## 2016-07-16 MED ORDER — HEPARIN SOD (PORK) LOCK FLUSH 100 UNIT/ML IV SOLN
INTRAVENOUS | Status: AC
  Filled 2016-07-16: qty 5

## 2016-07-16 MED ORDER — ACETAMINOPHEN 325 MG PO TABS
650.0000 mg | ORAL_TABLET | Freq: Once | ORAL | Status: AC
  Administered 2016-07-16: 650 mg via ORAL

## 2016-07-16 MED ORDER — SODIUM CHLORIDE 0.9 % IV SOLN
Freq: Once | INTRAVENOUS | Status: AC
  Administered 2016-07-16: 11:00:00 via INTRAVENOUS

## 2016-07-16 MED ORDER — SODIUM CHLORIDE 0.9 % IV SOLN
INTRAVENOUS | Status: DC
  Administered 2016-07-16: 14:00:00 via INTRAVENOUS

## 2016-07-16 MED ORDER — SODIUM CHLORIDE 0.9 % IV SOLN
6.0000 mg | Freq: Once | INTRAVENOUS | Status: AC
  Administered 2016-07-16: 6 mg via INTRAVENOUS
  Filled 2016-07-16: qty 4

## 2016-07-16 MED ORDER — DIPHENHYDRAMINE HCL 25 MG PO CAPS
ORAL_CAPSULE | ORAL | Status: AC
  Filled 2016-07-16: qty 2

## 2016-07-16 MED ORDER — SODIUM CHLORIDE 0.9 % IV SOLN
375.0000 mg/m2 | Freq: Once | INTRAVENOUS | Status: AC
  Administered 2016-07-16: 700 mg via INTRAVENOUS
  Filled 2016-07-16: qty 50

## 2016-07-16 MED ORDER — ACETAMINOPHEN 325 MG PO TABS
ORAL_TABLET | ORAL | Status: AC
  Filled 2016-07-16: qty 2

## 2016-07-16 MED ORDER — DIPHENHYDRAMINE HCL 25 MG PO CAPS
50.0000 mg | ORAL_CAPSULE | Freq: Once | ORAL | Status: AC
  Administered 2016-07-16: 50 mg via ORAL

## 2016-07-16 MED ORDER — HEPARIN SOD (PORK) LOCK FLUSH 100 UNIT/ML IV SOLN
500.0000 [IU] | Freq: Once | INTRAVENOUS | Status: AC | PRN
  Administered 2016-07-16: 500 [IU]

## 2016-07-16 MED ORDER — HEPARIN SOD (PORK) LOCK FLUSH 100 UNIT/ML IV SOLN: 250.0000 [IU] | Freq: Once | INTRAVENOUS | Status: DC | PRN

## 2016-07-16 NOTE — Patient Instructions (Signed)
[facility] Discharge Instructions for Patients Receiving Chemotherapy   Beginning January 23rd 2017 lab work for the [facility] will be done in the  Main lab at [facility] on 1st floor. If you have a lab appointment with the [place] please come in thru the  Main Entrance and check in at the main information desk   Today you received the following chemotherapy agents Rituxan today  To help prevent nausea and vomiting after your treatment, we encourage you to take your nausea medication     If you develop nausea and vomiting, or diarrhea that is not controlled by your medication, call the clinic.  The clinic phone number is [phone]. Office hours are Monday-Friday 8:30am-5:00pm.  BELOW ARE SYMPTOMS THAT SHOULD BE REPORTED IMMEDIATELY:  *FEVER GREATER THAN 101.0 F  *CHILLS WITH OR WITHOUT FEVER  NAUSEA AND VOMITING THAT IS NOT CONTROLLED WITH YOUR NAUSEA MEDICATION  *UNUSUAL SHORTNESS OF BREATH  *UNUSUAL BRUISING OR BLEEDING  TENDERNESS IN MOUTH AND THROAT WITH OR WITHOUT PRESENCE OF ULCERS  *URINARY PROBLEMS  *BOWEL PROBLEMS  UNUSUAL RASH Items with * indicate a potential emergency and should be followed up as soon as possible. If you have an emergency after office hours please contact your primary care physician or go to the nearest emergency department.  Please call the clinic during office hours if you have any questions or concerns.   You may also contact the Patient Navigator at [phone] should you have any questions or need assistance in obtaining follow up care.      Resources For Cancer Patients and their Caregivers ? American Cancer Society: Can assist with transportation, wigs, general needs, runs Look Good Feel Better.        [phone] ? Cancer Care: Provides financial assistance, online support groups, medication/co-pay assistance.  1-800-813-HOPE 305-651-9006) ? [place] Assists [place] cancer patients and their families through emotional , educational and financial support.  909-655-5947 ? [place] DSS Where to apply for food stamps, Medicaid and utility assistance. 669-299-6971 ? RCATS: Transportation to medical appointments. 934-355-3375 ? Social Security Administration: May apply for disability if have a Stage IV cancer. [phone] ? [employer], Disability and Transit Services: Assists with nutrition, care and transit needs. [phone]

## 2016-07-16 NOTE — Progress Notes (Signed)
Chemotherapy given today per orders. Patient tolerated well. 1 unit of blood given today per orders. Patient tolerated well. Vitals stable. Discharged ambulatory with wife at side.

## 2016-07-17 ENCOUNTER — Encounter (HOSPITAL_COMMUNITY): Payer: Self-pay | Admitting: Hematology & Oncology

## 2016-07-17 LAB — TYPE AND SCREEN
ABO/RH(D): O POS
ANTIBODY SCREEN: NEGATIVE
UNIT DIVISION: 0

## 2016-07-21 NOTE — Progress Notes (Signed)
Paul Keith Kitchen    HEMATOLOGY/ONCOLOGY PROGRESS NOTE  Date of Service: 07/23/2016  Patient Care Team: Paul Graves, DO as PCP - General  CHIEF COMPLAINTS:  NHL Pancytopenia BMBX on 05/06/2016 with extensive involvement by NHL B cell, admixed plasma cell component to a lesser extent which also shows kappa light chain restriction. DDX lymphoplasmacytic lymphoma, marginal zone lymphoma, follicular lymphoma with plasma cell differentiation, splenic lymphoma. Low grade process is favored.  HISTORY OF PRESENTING ILLNESS:  Paul Keith is a wonderful 82 y.o. male who has been referred to [place] by Dr .Paul Graves, DO for evaluation and management of pancytopenia. Bone marrow involvement shows extensive involvement with NHL vs lymphoplasmacytic lympohoma. Ddx includes lymphoplasmacytic lymphoma, marginal zone lymphoma, follicular lymphoma with plasma cell differentiation, splenic lymphoma. Course has been complicated by diverticulitis and diverticular abscess.    [name] is accompanied by his wife. He is here for Cycle #6 Rituximab. I personally reviewed and went over laboratory studies with the patient.  He feels very weak and had night sweats x 2 in the last 2 weeks. When he stands up quickly he becomes "woozy". States he has good days and days that aren't super.   He denies stumbling or falling. His wife notes if he gets up too quickly he does stumble but has not fallen.  He denies any troubles in his mouth. He denies abdominal pain.  He reports a little diarrhea the other night. His wife remarks usually he will have a normal bowel movement then the next is loose. He is still on his diet.   He is scheduled to follow with Dr. Ladona Keith on Wednesday and plans to ask for a colonoscopy.  He and his wife are going to [place] this weekend.  His daughter would like to be contacted to talk about how her father is doing.   MEDICAL HISTORY:  Past Medical History:  Diagnosis Date  . Anemia  04/22/2016  . Diverticulosis    pt reports recent admission for diverticulosis perforation this past week  . 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 Pancytopenia (Paul Keith) 04/22/2016  BPH L4-L5 fracture 6 weeks ago Vasomotor rhinitis for several years.  SURGICAL HISTORY: Past Surgical History:  Procedure Laterality Date  . CATARACT EXTRACTION Bilateral   . TONSILLECTOMY      SOCIAL HISTORY: Social History   Social History  . Marital status: Married    Spouse name: N/A  . Number of children: N/A  . Years of education: N/A   Occupational History  . Not on file.   Social History Main Topics  . Smoking status: Former Smoker    Quit date: 09/15/1962  . Smokeless tobacco: Never Used  . Alcohol use Yes     Comment: OCCASSIONAL  . Drug use: No  . Sexual activity: Not on file   Other Topics Concern  . Not on file   Social History Narrative  . No narrative on file    FAMILY HISTORY: No family history on file.  ALLERGIES:  has No Known Allergies.  MEDICATIONS:  Current Outpatient Prescriptions  Medication Sig Dispense Refill  . allopurinol (ZYLOPRIM) 300 MG tablet Take 1 tablet (300 mg total) by mouth 2 (two) times daily. 60 tablet 1  . cyanocobalamin 100 MCG tablet Take by mouth daily. Pt reports unsure of dosage, takes once daily    . furosemide (LASIX) 20 MG tablet Take 20 mg by mouth daily.    . RiTUXimab (RITUXAN IV) Inject  into the vein.    . tamsulosin (FLOMAX) 0.4 MG CAPS capsule Take 0.4 mg by mouth.    . traMADol (ULTRAM) 50 MG tablet   0   No current facility-administered medications for this visit.     REVIEW OF SYSTEMS:   Positive for malaise/fatigue and weakness. Positive for night sweats. Two episodes of night sweats in the last 2 weeks Positive for dizziness. Dizziness upon standing  14 point review of systems was performed and is negative except as detailed under history of present  illness and above   PHYSICAL EXAMINATION: ECOG PERFORMANCE STATUS: 1 - Symptomatic but completely ambulatory Vitals with BMI 07/23/2016  Height   Weight 153 lbs  BMI   Systolic 527  Diastolic 55  Pulse 782  Respirations 16   GENERAL:alert, in no acute distress and comfortable SKIN: skin color, texture, turgor are normal, no rashes or significant lesions EYES: normal, conjunctiva are pink and non-injected, sclera clear OROPHARYNX:no exudate, no erythema and lips, buccal mucosa, and tongue normal  NECK: supple, no JVD, thyroid normal size, non-tender, without nodularity LYMPH:  no palpable lymphadenopathy in the cervical, axillary or inguinal LUNGS: clear to auscultation with normal respiratory effort HEART: regular rate & rhythm,  no murmurs and no lower extremity edema ABDOMEN: abdomen soft, non-tender, normoactive bowel sounds no overt palpable hepatosplenomegaly Musculoskeletal: no cyanosis of digits and no clubbing  PSYCH: alert & oriented x 3 with fluent speech NEURO: no focal motor/sensory deficits  LABORATORY DATA:  I have reviewed the data as listed Results for Paul Keith (MRN [id number]) as of 07/23/2016 08:28 Results for Paul Keith (MRN [id number]) as of 07/24/2016 16:57  Ref. Range 07/23/2016 07:29  Sodium Latest Ref Range: 135 - 145 mmol/L 134 (L)  Potassium Latest Ref Range: 3.5 - 5.1 mmol/L 3.8  Chloride Latest Ref Range: 101 - 111 mmol/L 102  CO2 Latest Ref Range: 22 - 32 mmol/L 25  BUN Latest Ref Range: 6 - 20 mg/dL 23 (H)  Creatinine Latest Ref Range: 0.61 - 1.24 mg/dL 1.24  Calcium Latest Ref Range: 8.9 - 10.3 mg/dL 9.0  EGFR (Non-African Amer.) Latest Ref Range: >60 mL/min 52 (L)  EGFR (African American) Latest Ref Range: >60 mL/min 60 (L)  Glucose Latest Ref Range: 65 - 99 mg/dL 102 (H)  Anion gap Latest Ref Range: 5 - 15  7  Phosphorus Latest Ref Range: 2.5 - 4.6 mg/dL 3.3  Magnesium Latest Ref Range: 1.7 - 2.4 mg/dL 2.0  Alkaline Phosphatase Latest  Ref Range: 38 - 126 U/L 62  Albumin Latest Ref Range: 3.5 - 5.0 g/dL 3.0 (L)  Uric Acid, Serum Latest Ref Range: 4.4 - 7.6 mg/dL 2.4 (L)  AST Latest Ref Range: 15 - 41 U/L 15  ALT Latest Ref Range: 17 - 63 U/L 12 (L)  Total Protein Latest Ref Range: 6.5 - 8.1 g/dL 7.3  Total Bilirubin Latest Ref Range: 0.3 - 1.2 mg/dL 0.9  LDH Latest Ref Range: 98 - 192 U/L 302 (H)  WBC Latest Ref Range: 4.0 - 10.5 K/uL 2.5 (L)  RBC Latest Ref Range: 4.22 - 5.81 MIL/uL 2.94 (L)  Hemoglobin Latest Ref Range: 13.0 - 17.0 g/dL 8.3 (L)  HCT Latest Ref Range: 39.0 - 52.0 % 26.2 (L)  MCV Latest Ref Range: 78.0 - 100.0 fL 89.1  MCH Latest Ref Range: 26.0 - 34.0 pg 28.2  MCHC Latest Ref Range: 30.0 - 36.0 g/dL 31.7  RDW Latest Ref Range: 11.5 - 15.5 % 21.8 (H)  Platelets Latest Ref Range: 150 - 400 K/uL 101 (L)  Neutrophils Latest Units: % 52  Lymphocytes Latest Units: % 34  Monocytes Relative Latest Units: % 11  Eosinophil Latest Units: % 4  Basophil Latest Units: % 0  NEUT# Latest Ref Range: 1.7 - 7.7 K/uL 1.3 (L)  Lymphocyte # Latest Ref Range: 0.7 - 4.0 K/uL 0.8  Monocyte # Latest Ref Range: 0.1 - 1.0 K/uL 0.3  Eosinophils Absolute Latest Ref Range: 0.0 - 0.7 K/uL 0.1  Basophils Absolute Latest Ref Range: 0.0 - 0.1 K/uL 0.0       RADIOGRAPHIC STUDIES: I have personally reviewed the radiological images as listed and agreed with the findings in the report. No results found. Study Result   CLINICAL DATA:  82 year old male with perforation of the tests and several weeks ago. Chronic lymphocytic leukemia. Anemia.  EXAM: CT ABDOMEN AND PELVIS WITHOUT CONTRAST  TECHNIQUE: Multidetector CT imaging of the abdomen and pelvis was performed following the standard protocol without IV contrast.  COMPARISON:  05/16/2016 CT.  05/13/2016 PET-CT.  FINDINGS: Lower chest:  Negative.  Hepatobiliary: Unenhanced imaging without abnormality.  Pancreas: Unenhanced imaging without  abnormality.  Spleen: Marked splenomegaly once again noted.  Adrenals/Urinary Tract: Unenhanced imaging without abnormality noted.  Stomach/Bowel: Circumferential diffuse thickening of the mid to distal sigmoid colon which contains small number of diverticula. Adjacent small amount of fluid remains. Amount of fluid has decreased since prior examination with interval clearing of previously noted small amount of extraluminal air. It is possible that this represents diverticulitis which has improved but incompletely cleared. Underlying mass whether related to primary malignancy or lymphoma of the sigmoid colon not excluded given the fact that this region was hypermetabolic on recent PET-CT.  Shifting fluid within the upper quadrant adjacent to the duodenum/jejunum. No primary small bowel abnormality detected as a source of this fluid which may represent fluid from the pelvic inflammatory process shifting superiorly. Under distended stomach with limited evaluation without gross abnormality.  Vascular/Lymphatic: Calcified plaque aorta and iliac arteries without aneurysmal dilation. No adenopathy detected.  Reproductive: Enlarged prostate gland.  Other: As above.  Musculoskeletal: Schmorl's node deformity superior endplate L4. Schmorl's node deformity/superior endplate compression fracture L5. Findings unchanged. Questionable diffuse osseous involvement by lymphoma (noted on PET-CT) not appreciated by CT.  IMPRESSION: Circumferential diffuse thickening of the mid to distal sigmoid colon. Adjacent small amount of fluid has decreased since prior examination with interval clearing of previously noted small amount of extraluminal air. It is possible that this represents diverticulitis which has improved but incompletely cleared. Underlying mass whether related to primary malignancy or lymphoma of the sigmoid colon not excluded given the fact that this region  was hypermetabolic on recent PET-CT.  Shifting fluid within the upper quadrant adjacent to the duodenum/jejunum. No primary small bowel abnormality detected as a source of this fluid which may represent fluid from the pelvic inflammatory process shifting superiorly.  Marked splenomegaly once again noted.  Enlarged prostate gland.  Aortic atherosclerosis.   Electronically Signed   By: Genia Del M.D.   On: 06/01/2016 09:38     ASSESSMENT & PLAN:  NHL, low grade, heavy marrow involvement Pancytopenia with neutropenia IgM Kappa monoclonal gammopathy L4-L5 vertebral fracture Splenomegaly secondary to NHL Diffuse osseous uptake on PET secondary to NHL Recent diverticulitis/diverticular abscess  82 year old Caucasian male with good performance status with minimal chronic medical comorbidities with  #1 Pancytopenia with marrow showing lymphocytic infiltration concerning for lymphoproliferative process. LPL and MCL in the top  differentials.  #2 IgM Kappa monoclonal gammopathy with preliminary bone marrow picture showing lymphocytic infiltration concerning for Waldenstrom's Macroglobulinemia/lymphoplasmacytic lymphoma. IgM levels today have just crossed 2 g/dL. Patient has no clinical symptoms of hyperviscosity which I would not expect that these protein levels.  IgM  multiple myeloma would be exceptionally rare.  The patient is here for Cycle #6 Rituximab. I spoke with the patient and his wife about future treatment plans once his diseased bowel is addressed. He is scheduled to follow with Dr. Ladona Keith on Wednesday and will undergo a colonoscopy in the near future.  Labs reviewed. WBC is 2.5 today. He will receive a Neulasta shot today. He had baseline neutropenia prior to rituxan. Considering his diverticultis and abdominal issues I would certainly avoid neutropenia moving forward until he is adequately treated.   His daughter would like to be contacted to talk about how  her father is doing.  RTC for labs every other week and follow up in 1 month.   All of the patients and his family's questions were answered in details to their apparent satisfaction. The patient knows to call the clinic with any problems, questions or concerns.  This document serves as a record of services personally performed by Ancil Linsey, MD. It was created on her behalf by Arlyce Harman, a trained medical scribe. The creation of this record is based on the scribe's personal observations and the provider's statements to them. This document has been checked and approved by the attending provider.  I have reviewed the above documentation for accuracy and completeness, and I agree with the above.  Kelby Fam. , MD  07/23/2016 8:19 AM

## 2016-07-23 ENCOUNTER — Encounter (HOSPITAL_BASED_OUTPATIENT_CLINIC_OR_DEPARTMENT_OTHER): Payer: Medicare Other

## 2016-07-23 ENCOUNTER — Encounter (HOSPITAL_COMMUNITY): Payer: Self-pay | Admitting: Hematology & Oncology

## 2016-07-23 ENCOUNTER — Encounter (HOSPITAL_COMMUNITY): Payer: Medicare Other

## 2016-07-23 ENCOUNTER — Encounter (HOSPITAL_BASED_OUTPATIENT_CLINIC_OR_DEPARTMENT_OTHER): Payer: Medicare Other | Admitting: Hematology & Oncology

## 2016-07-23 VITALS — BP 117/55 | HR 100 | Temp 97.6°F | Resp 16 | Wt 153.0 lb

## 2016-07-23 VITALS — BP 121/74 | HR 97 | Temp 97.5°F | Resp 16

## 2016-07-23 DIAGNOSIS — Z9189 Other specified personal risk factors, not elsewhere classified: Secondary | ICD-10-CM

## 2016-07-23 DIAGNOSIS — C8800 Waldenstrom macroglobulinemia not having achieved remission: Secondary | ICD-10-CM

## 2016-07-23 DIAGNOSIS — Z5112 Encounter for antineoplastic immunotherapy: Secondary | ICD-10-CM

## 2016-07-23 DIAGNOSIS — D472 Monoclonal gammopathy: Secondary | ICD-10-CM

## 2016-07-23 DIAGNOSIS — C8597 Non-Hodgkin lymphoma, unspecified, spleen: Secondary | ICD-10-CM

## 2016-07-23 DIAGNOSIS — C8300 Small cell B-cell lymphoma, unspecified site: Secondary | ICD-10-CM

## 2016-07-23 DIAGNOSIS — D61818 Other pancytopenia: Secondary | ICD-10-CM

## 2016-07-23 DIAGNOSIS — D696 Thrombocytopenia, unspecified: Secondary | ICD-10-CM

## 2016-07-23 DIAGNOSIS — R161 Splenomegaly, not elsewhere classified: Secondary | ICD-10-CM | POA: Diagnosis not present

## 2016-07-23 DIAGNOSIS — D479 Neoplasm of uncertain behavior of lymphoid, hematopoietic and related tissue, unspecified: Secondary | ICD-10-CM

## 2016-07-23 DIAGNOSIS — K5780 Diverticulitis of intestine, part unspecified, with perforation and abscess without bleeding: Secondary | ICD-10-CM

## 2016-07-23 LAB — LACTATE DEHYDROGENASE: LDH: 302 U/L — ABNORMAL HIGH (ref 98–192)

## 2016-07-23 LAB — COMPREHENSIVE METABOLIC PANEL
ALK PHOS: 62 U/L (ref 38–126)
ALT: 12 U/L — ABNORMAL LOW (ref 17–63)
ANION GAP: 7 (ref 5–15)
AST: 15 U/L (ref 15–41)
Albumin: 3 g/dL — ABNORMAL LOW (ref 3.5–5.0)
BILIRUBIN TOTAL: 0.9 mg/dL (ref 0.3–1.2)
BUN: 23 mg/dL — AB (ref 6–20)
CO2: 25 mmol/L (ref 22–32)
Calcium: 9 mg/dL (ref 8.9–10.3)
Chloride: 102 mmol/L (ref 101–111)
Creatinine, Ser: 1.24 mg/dL (ref 0.61–1.24)
GFR calc Af Amer: 60 mL/min — ABNORMAL LOW (ref >60)
GFR, EST NON AFRICAN AMERICAN: 52 mL/min — AB (ref >60)
GLUCOSE: 102 mg/dL — AB (ref 65–99)
POTASSIUM: 3.8 mmol/L (ref 3.5–5.1)
Sodium: 134 mmol/L — ABNORMAL LOW (ref 135–145)
Total Protein: 7.3 g/dL (ref 6.5–8.1)

## 2016-07-23 LAB — CBC WITH DIFFERENTIAL/PLATELET
BASOS ABS: 0 10*3/uL (ref 0.0–0.1)
Basophils Relative: 0 %
Eosinophils Absolute: 0.1 10*3/uL (ref 0.0–0.7)
Eosinophils Relative: 4 %
HEMATOCRIT: 26.2 % — AB (ref 39.0–52.0)
HEMOGLOBIN: 8.3 g/dL — AB (ref 13.0–17.0)
LYMPHS PCT: 34 %
Lymphs Abs: 0.8 10*3/uL (ref 0.7–4.0)
MCH: 28.2 pg (ref 26.0–34.0)
MCHC: 31.7 g/dL (ref 30.0–36.0)
MCV: 89.1 fL (ref 78.0–100.0)
MONO ABS: 0.3 10*3/uL (ref 0.1–1.0)
MONOS PCT: 11 %
NEUTROS ABS: 1.3 10*3/uL — AB (ref 1.7–7.7)
NEUTROS PCT: 52 %
Platelets: 101 10*3/uL — ABNORMAL LOW (ref 150–400)
RBC: 2.94 MIL/uL — ABNORMAL LOW (ref 4.22–5.81)
RDW: 21.8 % — AB (ref 11.5–15.5)
WBC: 2.5 10*3/uL — ABNORMAL LOW (ref 4.0–10.5)

## 2016-07-23 LAB — MAGNESIUM: Magnesium: 2 mg/dL (ref 1.7–2.4)

## 2016-07-23 LAB — URIC ACID: URIC ACID, SERUM: 2.4 mg/dL — AB (ref 4.4–7.6)

## 2016-07-23 LAB — PHOSPHORUS: Phosphorus: 3.3 mg/dL (ref 2.5–4.6)

## 2016-07-23 MED ORDER — SODIUM CHLORIDE 0.9% FLUSH
10.0000 mL | INTRAVENOUS | Status: DC | PRN
  Administered 2016-07-23: 10 mL
  Filled 2016-07-23: qty 10

## 2016-07-23 MED ORDER — HEPARIN SOD (PORK) LOCK FLUSH 100 UNIT/ML IV SOLN
500.0000 [IU] | Freq: Once | INTRAVENOUS | Status: AC | PRN
  Administered 2016-07-23: 500 [IU]

## 2016-07-23 MED ORDER — PEGFILGRASTIM 6 MG/0.6ML ~~LOC~~ PSKT: 6.0000 mg | PREFILLED_SYRINGE | Freq: Once | SUBCUTANEOUS | Status: DC

## 2016-07-23 MED ORDER — SODIUM CHLORIDE 0.9 % IV SOLN
20.0000 mg | Freq: Once | INTRAVENOUS | Status: AC
  Administered 2016-07-23: 20 mg via INTRAVENOUS
  Filled 2016-07-23: qty 2

## 2016-07-23 MED ORDER — HEPARIN SOD (PORK) LOCK FLUSH 100 UNIT/ML IV SOLN
INTRAVENOUS | Status: AC
  Filled 2016-07-23: qty 5

## 2016-07-23 MED ORDER — DIPHENHYDRAMINE HCL 25 MG PO CAPS
ORAL_CAPSULE | ORAL | Status: AC
  Filled 2016-07-23: qty 2

## 2016-07-23 MED ORDER — DIPHENHYDRAMINE HCL 25 MG PO CAPS
50.0000 mg | ORAL_CAPSULE | Freq: Once | ORAL | Status: AC
  Administered 2016-07-23: 50 mg via ORAL

## 2016-07-23 MED ORDER — PEGFILGRASTIM INJECTION 6 MG/0.6ML ~~LOC~~
6.0000 mg | PREFILLED_SYRINGE | Freq: Once | SUBCUTANEOUS | Status: AC
  Administered 2016-07-23: 6 mg via SUBCUTANEOUS

## 2016-07-23 MED ORDER — ACETAMINOPHEN 325 MG PO TABS
ORAL_TABLET | ORAL | Status: AC
  Filled 2016-07-23: qty 2

## 2016-07-23 MED ORDER — ACETAMINOPHEN 325 MG PO TABS
650.0000 mg | ORAL_TABLET | Freq: Once | ORAL | Status: AC
  Administered 2016-07-23: 650 mg via ORAL

## 2016-07-23 MED ORDER — SODIUM CHLORIDE 0.9 % IV SOLN
375.0000 mg/m2 | Freq: Once | INTRAVENOUS | Status: AC
  Administered 2016-07-23: 700 mg via INTRAVENOUS
  Filled 2016-07-23: qty 20

## 2016-07-23 MED ORDER — SODIUM CHLORIDE 0.9 % IV SOLN
Freq: Once | INTRAVENOUS | Status: AC
  Administered 2016-07-23: 09:00:00 via INTRAVENOUS

## 2016-07-23 MED ORDER — PEGFILGRASTIM INJECTION 6 MG/0.6ML ~~LOC~~
PREFILLED_SYRINGE | SUBCUTANEOUS | Status: AC
  Filled 2016-07-23: qty 0.6

## 2016-07-23 NOTE — Progress Notes (Signed)
Paul Keith presents today for injection per MD orders. Neulasta 6mg  administered SQ in left Abdomen. Administration without incident. Patient tolerated well.      Patient presented today for chemo. Patient tolerated well without incidence. Vitals stable. Discharged home with wife at side.

## 2016-07-23 NOTE — Progress Notes (Signed)
See other encounter.

## 2016-07-23 NOTE — Patient Instructions (Addendum)
[place] at [facility] Discharge Instructions  RECOMMENDATIONS MADE BY THE CONSULTANT AND ANY TEST RESULTS WILL BE SENT TO YOUR REFERRING PHYSICIAN.  You saw Dr. Whitney Muse today. Labs every 2 weeks. Follow up with Dr. Whitney Muse in 1 month  Thank you for choosing [place] at [facility] to provide your oncology and hematology care.  To afford each patient quality time with our provider, please arrive at least 15 minutes before your scheduled appointment time.   Beginning January 23rd 2017 lab work for the [name] will be done in the  Main lab at [facility] on 1st floor. If you have a lab appointment with the [facility] please come in thru the  Main Entrance and check in at the main information desk  You need to re-schedule your appointment should you arrive 10 or more minutes late.  We strive to give you quality time with our providers, and arriving late affects you and other patients whose appointments are after yours.  Also, if you no show three or more times for appointments you may be dismissed from the clinic at the providers discretion.     Again, thank you for choosing [facility].  Our hope is that these requests will decrease the amount of time that you wait before being seen by our physicians.       _____________________________________________________________  Should you have questions after your visit to [facility], please contact our office at [phone] between the hours of 8:30 a.m. and 4:30 p.m.  Voicemails left after 4:30 p.m. will not be returned until the following business day.  For prescription refill requests, have your pharmacy contact our office.         Resources For Cancer Patients and their Caregivers ? American Cancer Society: Can assist with transportation, wigs, general needs, runs Look Good Feel Better.        [phone] ? Cancer Care: Provides financial assistance,  online support groups, medication/co-pay assistance.  1-800-813-HOPE (309) 673-8235) ? [place] Assists [place] cancer patients and their families through emotional , educational and financial support.  812-207-5129 ? [place] DSS Where to apply for food stamps, Medicaid and utility assistance. 5161462710 ? RCATS: Transportation to medical appointments. (463)363-0222 ? Social Security Administration: May apply for disability if have a Stage IV cancer. 475-390-6474 [phone] ? [employer], Disability and Transit Services: Assists with nutrition, care and transit needs. [place] Support Programs: @10RELATIVEDAYS @ > Cancer Support Group  2nd Tuesday of the month 1pm-2pm, Journey Room  > Creative Journey  3rd Tuesday of the month 1130am-1pm, Journey Room  > Look Good Feel Better  1st Wednesday of the month 10am-12 noon, Journey Room (Call [place] to register [phone])

## 2016-07-23 NOTE — Patient Instructions (Signed)
[facility] Discharge Instructions for Patients Receiving Chemotherapy   Beginning January 23rd 2017 lab work for the [facility] will be done in the  Main lab at [facility] on 1st floor. If you have a lab appointment with the [facility] please come in thru the  Main Entrance and check in at the main information desk   Today you received the following chemotherapy agents Rituxan and the neulasta injection  To help prevent nausea and vomiting after your treatment, we encourage you to take your nausea medication      If you develop nausea and vomiting, or diarrhea that is not controlled by your medication, call the clinic.  The clinic phone number is [phone]. Office hours are Monday-Friday 8:30am-5:00pm.  BELOW ARE SYMPTOMS THAT SHOULD BE REPORTED IMMEDIATELY:  *FEVER GREATER THAN 101.0 F  *CHILLS WITH OR WITHOUT FEVER  NAUSEA AND VOMITING THAT IS NOT CONTROLLED WITH YOUR NAUSEA MEDICATION  *UNUSUAL SHORTNESS OF BREATH  *UNUSUAL BRUISING OR BLEEDING  TENDERNESS IN MOUTH AND THROAT WITH OR WITHOUT PRESENCE OF ULCERS  *URINARY PROBLEMS  *BOWEL PROBLEMS  UNUSUAL RASH Items with * indicate a potential emergency and should be followed up as soon as possible. If you have an emergency after office hours please contact your primary care physician or go to the nearest emergency department.  Please call the clinic during office hours if you have any questions or concerns.   You may also contact the Patient Navigator at [phone] should you have any questions or need assistance in obtaining follow up care.      Resources For Cancer Patients and their Caregivers ? American Cancer Society: Can assist with transportation, wigs, general needs, runs Look Good Feel Better.        505-767-9437 ? Cancer Care: Provides financial assistance, online support groups, medication/co-pay assistance.  1-800-813-HOPE 318-030-3434) ? [place] Assists [place] cancer patients and their families through emotional , educational and financial support.  334-338-3252 ? [place] DSS Where to apply for food stamps, Medicaid and utility assistance. 3175035987 ? RCATS: Transportation to medical appointments. (579)740-6838 ? Social Security Administration: May apply for disability if have a Stage IV cancer. [phone])216-6037 [phone] ? [employer], Disability and Transit Services: Assists with nutrition, care and transit needs. [phone]

## 2016-07-24 ENCOUNTER — Encounter (HOSPITAL_COMMUNITY): Payer: Self-pay | Admitting: Hematology & Oncology

## 2016-07-26 ENCOUNTER — Encounter (HOSPITAL_COMMUNITY): Payer: Self-pay

## 2016-07-26 ENCOUNTER — Other Ambulatory Visit (HOSPITAL_COMMUNITY): Payer: Self-pay | Admitting: Pharmacist

## 2016-08-06 ENCOUNTER — Encounter (HOSPITAL_COMMUNITY): Payer: Medicare Other

## 2016-08-06 ENCOUNTER — Other Ambulatory Visit (HOSPITAL_COMMUNITY): Payer: Self-pay | Admitting: Emergency Medicine

## 2016-08-06 DIAGNOSIS — C880 Waldenstrom macroglobulinemia: Secondary | ICD-10-CM | POA: Diagnosis not present

## 2016-08-06 DIAGNOSIS — D61818 Other pancytopenia: Secondary | ICD-10-CM

## 2016-08-06 DIAGNOSIS — D649 Anemia, unspecified: Secondary | ICD-10-CM

## 2016-08-06 DIAGNOSIS — D479 Neoplasm of uncertain behavior of lymphoid, hematopoietic and related tissue, unspecified: Secondary | ICD-10-CM

## 2016-08-06 LAB — CBC WITH DIFFERENTIAL/PLATELET
BASOS ABS: 0 10*3/uL (ref 0.0–0.1)
BASOS PCT: 0 %
Eosinophils Absolute: 0 10*3/uL (ref 0.0–0.7)
Eosinophils Relative: 1 %
HEMATOCRIT: 23.1 % — AB (ref 39.0–52.0)
HEMOGLOBIN: 7.4 g/dL — AB (ref 13.0–17.0)
LYMPHS PCT: 30 %
Lymphs Abs: 0.9 10*3/uL (ref 0.7–4.0)
MCH: 30.6 pg (ref 26.0–34.0)
MCHC: 32 g/dL (ref 30.0–36.0)
MCV: 95.5 fL (ref 78.0–100.0)
MONO ABS: 0.1 10*3/uL (ref 0.1–1.0)
Monocytes Relative: 4 %
NEUTROS ABS: 1.9 10*3/uL (ref 1.7–7.7)
NEUTROS PCT: 65 %
Platelets: 102 10*3/uL — ABNORMAL LOW (ref 150–400)
RBC: 2.42 MIL/uL — AB (ref 4.22–5.81)
RDW: 23.4 % — ABNORMAL HIGH (ref 11.5–15.5)
WBC: 2.9 10*3/uL — ABNORMAL LOW (ref 4.0–10.5)

## 2016-08-06 LAB — COMPREHENSIVE METABOLIC PANEL
ALBUMIN: 3 g/dL — AB (ref 3.5–5.0)
ALT: 12 U/L — AB (ref 17–63)
AST: 14 U/L — AB (ref 15–41)
Alkaline Phosphatase: 64 U/L (ref 38–126)
Anion gap: 6 (ref 5–15)
BILIRUBIN TOTAL: 0.6 mg/dL (ref 0.3–1.2)
BUN: 27 mg/dL — AB (ref 6–20)
CO2: 24 mmol/L (ref 22–32)
CREATININE: 1.13 mg/dL (ref 0.61–1.24)
Calcium: 8.7 mg/dL — ABNORMAL LOW (ref 8.9–10.3)
Chloride: 105 mmol/L (ref 101–111)
GFR calc Af Amer: >60 mL/min (ref >60)
GFR, EST NON AFRICAN AMERICAN: 58 mL/min — AB (ref >60)
GLUCOSE: 93 mg/dL (ref 65–99)
Potassium: 3.9 mmol/L (ref 3.5–5.1)
Sodium: 135 mmol/L (ref 135–145)
TOTAL PROTEIN: 6.6 g/dL (ref 6.5–8.1)

## 2016-08-09 ENCOUNTER — Other Ambulatory Visit (HOSPITAL_COMMUNITY): Payer: Self-pay

## 2016-08-09 ENCOUNTER — Other Ambulatory Visit (HOSPITAL_COMMUNITY): Payer: Medicare Other

## 2016-08-09 ENCOUNTER — Other Ambulatory Visit (HOSPITAL_COMMUNITY): Payer: Self-pay | Admitting: Hematology & Oncology

## 2016-08-09 ENCOUNTER — Other Ambulatory Visit: Payer: Self-pay | Admitting: General Surgery

## 2016-08-09 ENCOUNTER — Encounter (HOSPITAL_COMMUNITY): Payer: Medicare Other

## 2016-08-09 DIAGNOSIS — K5720 Diverticulitis of large intestine with perforation and abscess without bleeding: Secondary | ICD-10-CM

## 2016-08-09 DIAGNOSIS — D649 Anemia, unspecified: Secondary | ICD-10-CM

## 2016-08-09 DIAGNOSIS — C880 Waldenstrom macroglobulinemia: Secondary | ICD-10-CM | POA: Diagnosis not present

## 2016-08-09 LAB — PREPARE RBC (CROSSMATCH)

## 2016-08-09 NOTE — Progress Notes (Signed)
START ON PATHWAY REGIMEN - Lymphoma and CLL  UDTH438: Bendamustine + Rituximab (90/375) q28 Days   A cycle is every 28 days:     Bendamustine (Bendeka(TM)) 90 mg/m2 in 50 mL NS IV over 10 minutes days 1 and 2. Administer first Dose Mod: None     Rituximab (Rituxan(R)) 375 mg/m2 in _____ mL NS IV day 1 only.  Initiate first dose at a rate of 50 mg/hr.  In the absence of infusion toxicity, increase infusion rate by 50 mg/hr increments every 30 minutes, to a maximum of 400 mg/hr. If first dose  tolerated, may initiate subsequent infusions at an initial rate of 100 mg/hr.  In the absence of infusion toxicity, may increase rate by 100 mg/hr increments at 30-min intervals to a maximum of 400 mg/hr. For follicular and DLBC lymphoma patients see  "Rapid infusion protocol" link for more information about accelerating the infusion time of rituximab Dose Mod: None Additional Orders: Bendamustine use not recommended in patients with CrCl < 40 mL/min or with moderate (AST/ALT 2.5-10 xULN and total bili 1.5-3 xULN) or severe (total bili > 3 xULN) hepatic impairment. Hepatitis B&C testing recommended prior to  rituximab use on all patients. Final concentration of rituximab must be between 1 and 4 mg/mL.  **Always confirm dose/schedule in your pharmacy ordering system**    Patient Characteristics: Waldenstrom's Macroglobulinemia/Lymphoplasmacytic Lymphoma, Symptomatic, First Line Disease Type: Waldenstrom's Macroglobulinemia/Lymphoplasmacytic Lymphoma Disease Type: Not Applicable Asymptomatic or Symptomatic? Symptomatic Line of therapy: First Line  Intent of Therapy: Non-Curative / Palliative Intent, Discussed with Patient

## 2016-08-10 ENCOUNTER — Encounter: Payer: Self-pay | Admitting: Gastroenterology

## 2016-08-10 ENCOUNTER — Encounter (HOSPITAL_BASED_OUTPATIENT_CLINIC_OR_DEPARTMENT_OTHER): Payer: Medicare Other

## 2016-08-10 ENCOUNTER — Encounter (HOSPITAL_COMMUNITY): Payer: Medicare Other

## 2016-08-10 DIAGNOSIS — C880 Waldenstrom macroglobulinemia: Secondary | ICD-10-CM | POA: Diagnosis not present

## 2016-08-10 DIAGNOSIS — D472 Monoclonal gammopathy: Secondary | ICD-10-CM

## 2016-08-10 DIAGNOSIS — D649 Anemia, unspecified: Secondary | ICD-10-CM | POA: Diagnosis not present

## 2016-08-10 MED ORDER — ONDANSETRON HCL 8 MG PO TABS
8.0000 mg | ORAL_TABLET | Freq: Two times a day (BID) | ORAL | 1 refills | Status: DC | PRN
Start: 2016-08-10 — End: 2016-08-10

## 2016-08-10 MED ORDER — PROCHLORPERAZINE MALEATE 10 MG PO TABS: 10.0000 mg | ORAL_TABLET | Freq: Four times a day (QID) | ORAL | 1 refills | Status: DC | PRN

## 2016-08-10 MED ORDER — DIPHENHYDRAMINE HCL 25 MG PO CAPS
25.0000 mg | ORAL_CAPSULE | Freq: Once | ORAL | Status: AC
  Administered 2016-08-10: 25 mg via ORAL

## 2016-08-10 MED ORDER — ALLOPURINOL 300 MG PO TABS: 300.0000 mg | ORAL_TABLET | Freq: Every day | ORAL | 3 refills | Status: DC

## 2016-08-10 MED ORDER — ACYCLOVIR 400 MG PO TABS: 400.0000 mg | ORAL_TABLET | Freq: Every day | ORAL | 3 refills | Status: DC

## 2016-08-10 MED ORDER — SODIUM CHLORIDE 0.9 % IV SOLN
250.0000 mL | Freq: Once | INTRAVENOUS | Status: AC
  Administered 2016-08-10: 250 mL via INTRAVENOUS

## 2016-08-10 MED ORDER — ONDANSETRON HCL 8 MG PO TABS: 8.0000 mg | ORAL_TABLET | Freq: Two times a day (BID) | ORAL | 1 refills | Status: DC | PRN

## 2016-08-10 MED ORDER — ACETAMINOPHEN 325 MG PO TABS
ORAL_TABLET | ORAL | Status: AC
  Filled 2016-08-10: qty 2

## 2016-08-10 MED ORDER — HEPARIN SOD (PORK) LOCK FLUSH 100 UNIT/ML IV SOLN
500.0000 [IU] | Freq: Every day | INTRAVENOUS | Status: AC | PRN
  Administered 2016-08-10: 500 [IU]
  Filled 2016-08-10: qty 5

## 2016-08-10 MED ORDER — SODIUM CHLORIDE 0.9% FLUSH
10.0000 mL | INTRAVENOUS | Status: AC | PRN
  Administered 2016-08-10: 10 mL

## 2016-08-10 MED ORDER — DEXAMETHASONE 4 MG PO TABS
8.0000 mg | ORAL_TABLET | Freq: Every day | ORAL | 1 refills | Status: DC
Start: 2016-08-10 — End: 2016-12-27

## 2016-08-10 MED ORDER — DIPHENHYDRAMINE HCL 25 MG PO CAPS
ORAL_CAPSULE | ORAL | Status: AC
  Filled 2016-08-10: qty 1

## 2016-08-10 MED ORDER — ACETAMINOPHEN 325 MG PO TABS
650.0000 mg | ORAL_TABLET | Freq: Once | ORAL | Status: AC
  Administered 2016-08-10: 650 mg via ORAL

## 2016-08-10 NOTE — Patient Instructions (Signed)
[place] at [facility] Discharge Instructions  RECOMMENDATIONS MADE BY THE CONSULTANT AND ANY TEST RESULTS WILL BE SENT TO YOUR REFERRING PHYSICIAN.  Today you received a transfusion 2 units packed red blood cells. Return as scheduled.  Thank you for choosing [place] at [facility] to provide your oncology and hematology care.  To afford each patient quality time with our provider, please arrive at least 15 minutes before your scheduled appointment time.   Beginning January 23rd 2017 lab work for the [name] will be done in the  Main lab at [facility] on 1st floor. If you have a lab appointment with the [facility] please come in thru the  Main Entrance and check in at the main information desk  You need to re-schedule your appointment should you arrive 10 or more minutes late.  We strive to give you quality time with our providers, and arriving late affects you and other patients whose appointments are after yours.  Also, if you no show three or more times for appointments you may be dismissed from the clinic at the providers discretion.     Again, thank you for choosing [facility].  Our hope is that these requests will decrease the amount of time that you wait before being seen by our physicians.       _____________________________________________________________  Should you have questions after your visit to [facility], please contact our office at [phone] between the hours of 8:30 a.m. and 4:30 p.m.  Voicemails left after 4:30 p.m. will not be returned until the following business day.  For prescription refill requests, have your pharmacy contact our office.         Resources For Cancer Patients and their Caregivers ? American Cancer Society: Can assist with transportation, wigs, general needs, runs Look Good Feel Better.        (407)082-2121 ? Cancer Care: Provides financial assistance,  online support groups, medication/co-pay assistance.  1-800-813-HOPE (364) 850-5811) ? [facility] Assists [place] cancer patients and their families through emotional , educational and financial support.  316-301-5160 ? [place] DSS Where to apply for food stamps, Medicaid and utility assistance. 704-650-4072 ? [employer]: Transportation to medical appointments. (915)661-5099 ? Social Security Administration: May apply for disability if have a Stage IV cancer. (417)838-1614 (727) 122-1166 ? [employer], Disability and Transit Services: Assists with nutrition, care and transit needs. [place] Support Programs: @10RELATIVEDAYS @ > Cancer Support Group  2nd Tuesday of the month 1pm-2pm, Journey Room  > Creative Journey  3rd Tuesday of the month 1130am-1pm, Journey Room  > Look Good Feel Better  1st Wednesday of the month 10am-12 noon, Journey Room (Call [place] to register [phone])

## 2016-08-10 NOTE — Progress Notes (Signed)
Tolerated blood transfusion well. Stable and ambulatory on discharge home with wife.

## 2016-08-11 LAB — TYPE AND SCREEN
ABO/RH(D): O POS
Antibody Screen: NEGATIVE
UNIT DIVISION: 0
Unit division: 0

## 2016-08-16 ENCOUNTER — Ambulatory Visit (HOSPITAL_COMMUNITY)
Admission: RE | Admit: 2016-08-16 | Discharge: 2016-08-16 | Disposition: A | Discharge status: Home / Self Care | Payer: Medicare Other | Source: Ambulatory Visit | Attending: General Surgery | Admitting: General Surgery

## 2016-08-16 ENCOUNTER — Telehealth (HOSPITAL_COMMUNITY): Payer: Self-pay | Admitting: Hematology & Oncology

## 2016-08-16 DIAGNOSIS — K5720 Diverticulitis of large intestine with perforation and abscess without bleeding: Secondary | ICD-10-CM | POA: Diagnosis not present

## 2016-08-16 DIAGNOSIS — I2510 Atherosclerotic heart disease of native coronary artery without angina pectoris: Secondary | ICD-10-CM | POA: Diagnosis not present

## 2016-08-16 DIAGNOSIS — R188 Other ascites: Secondary | ICD-10-CM | POA: Diagnosis not present

## 2016-08-16 DIAGNOSIS — R161 Splenomegaly, not elsewhere classified: Secondary | ICD-10-CM | POA: Insufficient documentation

## 2016-08-16 MED ORDER — IOPAMIDOL (ISOVUE-300) INJECTION 61%
100.0000 mL | Freq: Once | INTRAVENOUS | Status: AC | PRN
  Administered 2016-08-16: 100 mL via INTRAVENOUS

## 2016-08-16 NOTE — Telephone Encounter (Signed)
ONDANSETRON 8 MG APPROVED 08/11/16-08/11/17

## 2016-08-17 ENCOUNTER — Encounter (HOSPITAL_COMMUNITY): Payer: Self-pay

## 2016-08-17 ENCOUNTER — Encounter (HOSPITAL_COMMUNITY): Payer: Medicare Other | Attending: Hematology & Oncology

## 2016-08-17 VITALS — BP 99/43 | HR 92 | Temp 97.8°F | Resp 18 | Wt 157.2 lb

## 2016-08-17 DIAGNOSIS — Z87891 Personal history of nicotine dependence: Secondary | ICD-10-CM | POA: Insufficient documentation

## 2016-08-17 DIAGNOSIS — D649 Anemia, unspecified: Secondary | ICD-10-CM | POA: Diagnosis not present

## 2016-08-17 DIAGNOSIS — C8597 Non-Hodgkin lymphoma, unspecified, spleen: Secondary | ICD-10-CM | POA: Diagnosis not present

## 2016-08-17 DIAGNOSIS — R5383 Other fatigue: Secondary | ICD-10-CM | POA: Diagnosis not present

## 2016-08-17 DIAGNOSIS — C8800 Waldenstrom macroglobulinemia not having achieved remission: Secondary | ICD-10-CM

## 2016-08-17 DIAGNOSIS — D696 Thrombocytopenia, unspecified: Secondary | ICD-10-CM | POA: Insufficient documentation

## 2016-08-17 DIAGNOSIS — Z5111 Encounter for antineoplastic chemotherapy: Secondary | ICD-10-CM

## 2016-08-17 DIAGNOSIS — D472 Monoclonal gammopathy: Secondary | ICD-10-CM

## 2016-08-17 DIAGNOSIS — Z9889 Other specified postprocedural states: Secondary | ICD-10-CM | POA: Insufficient documentation

## 2016-08-17 DIAGNOSIS — Z79899 Other long term (current) drug therapy: Secondary | ICD-10-CM | POA: Diagnosis not present

## 2016-08-17 DIAGNOSIS — D61818 Other pancytopenia: Secondary | ICD-10-CM

## 2016-08-17 DIAGNOSIS — Z5112 Encounter for antineoplastic immunotherapy: Secondary | ICD-10-CM

## 2016-08-17 DIAGNOSIS — D479 Neoplasm of uncertain behavior of lymphoid, hematopoietic and related tissue, unspecified: Secondary | ICD-10-CM

## 2016-08-17 LAB — COMPREHENSIVE METABOLIC PANEL
ALK PHOS: 61 U/L (ref 38–126)
ALT: 14 U/L — AB (ref 17–63)
ANION GAP: 5 (ref 5–15)
AST: 17 U/L (ref 15–41)
Albumin: 3 g/dL — ABNORMAL LOW (ref 3.5–5.0)
BILIRUBIN TOTAL: 0.5 mg/dL (ref 0.3–1.2)
BUN: 21 mg/dL — ABNORMAL HIGH (ref 6–20)
CO2: 25 mmol/L (ref 22–32)
CREATININE: 1.06 mg/dL (ref 0.61–1.24)
Calcium: 8.8 mg/dL — ABNORMAL LOW (ref 8.9–10.3)
Chloride: 104 mmol/L (ref 101–111)
Glucose, Bld: 97 mg/dL (ref 65–99)
Potassium: 4.1 mmol/L (ref 3.5–5.1)
Sodium: 134 mmol/L — ABNORMAL LOW (ref 135–145)
TOTAL PROTEIN: 6.1 g/dL — AB (ref 6.5–8.1)

## 2016-08-17 LAB — CBC WITH DIFFERENTIAL/PLATELET
BASOS PCT: 0 %
Basophils Absolute: 0 10*3/uL (ref 0.0–0.1)
EOS PCT: 3 %
Eosinophils Absolute: 0 10*3/uL (ref 0.0–0.7)
HEMATOCRIT: 21.8 % — AB (ref 39.0–52.0)
HEMOGLOBIN: 7.2 g/dL — AB (ref 13.0–17.0)
LYMPHS PCT: 51 %
Lymphs Abs: 0.7 10*3/uL (ref 0.7–4.0)
MCH: 30.4 pg (ref 26.0–34.0)
MCHC: 33 g/dL (ref 30.0–36.0)
MCV: 92 fL (ref 78.0–100.0)
MONO ABS: 0.1 10*3/uL (ref 0.1–1.0)
MONOS PCT: 5 %
NEUTROS PCT: 41 %
Neutro Abs: 0.5 10*3/uL — ABNORMAL LOW (ref 1.7–7.7)
Platelets: 105 10*3/uL — ABNORMAL LOW (ref 150–400)
RBC: 2.37 MIL/uL — AB (ref 4.22–5.81)
RDW: 19.9 % — ABNORMAL HIGH (ref 11.5–15.5)
WBC: 1.3 10*3/uL — AB (ref 4.0–10.5)

## 2016-08-17 LAB — PREPARE RBC (CROSSMATCH)

## 2016-08-17 MED ORDER — SODIUM CHLORIDE 0.9 % IV SOLN
375.0000 mg/m2 | Freq: Once | INTRAVENOUS | Status: AC
  Administered 2016-08-17: 700 mg via INTRAVENOUS
  Filled 2016-08-17: qty 50

## 2016-08-17 MED ORDER — ACETAMINOPHEN 325 MG PO TABS
650.0000 mg | ORAL_TABLET | Freq: Once | ORAL | Status: AC
  Administered 2016-08-17: 650 mg via ORAL
  Filled 2016-08-17: qty 2

## 2016-08-17 MED ORDER — SODIUM CHLORIDE 0.9 % IV SOLN
Freq: Once | INTRAVENOUS | Status: AC
  Administered 2016-08-17: 12:00:00 via INTRAVENOUS

## 2016-08-17 MED ORDER — BENDAMUSTINE HCL CHEMO INJECTION 100 MG/4ML
90.0000 mg/m2 | Freq: Once | INTRAVENOUS | Status: AC
  Administered 2016-08-17: 175 mg via INTRAVENOUS
  Filled 2016-08-17: qty 4

## 2016-08-17 MED ORDER — HEPARIN SOD (PORK) LOCK FLUSH 100 UNIT/ML IV SOLN
500.0000 [IU] | Freq: Once | INTRAVENOUS | Status: AC | PRN
  Administered 2016-08-17: 500 [IU]

## 2016-08-17 MED ORDER — SODIUM CHLORIDE 0.9% FLUSH: 10.0000 mL | INTRAVENOUS | Status: DC | PRN

## 2016-08-17 MED ORDER — DIPHENHYDRAMINE HCL 25 MG PO CAPS
50.0000 mg | ORAL_CAPSULE | Freq: Once | ORAL | Status: AC
  Administered 2016-08-17: 50 mg via ORAL
  Filled 2016-08-17: qty 2

## 2016-08-17 MED ORDER — PALONOSETRON HCL INJECTION 0.25 MG/5ML
0.2500 mg | Freq: Once | INTRAVENOUS | Status: AC
  Administered 2016-08-17: 0.25 mg via INTRAVENOUS
  Filled 2016-08-17: qty 5

## 2016-08-17 MED ORDER — SODIUM CHLORIDE 0.9 % IV SOLN
10.0000 mg | Freq: Once | INTRAVENOUS | Status: AC
  Administered 2016-08-17: 10 mg via INTRAVENOUS
  Filled 2016-08-17: qty 1

## 2016-08-17 NOTE — Progress Notes (Signed)
Dr. Whitney Muse aware of lab results - okay to treat today per MD.  Tolerated tx w/o adverse reaction.  Alert, in no distress.  VSS.  Discharged ambulatory.

## 2016-08-17 NOTE — Patient Instructions (Signed)
[facility] Discharge Instructions for Patients Receiving Chemotherapy   Beginning January 23rd 2017 lab work for the Cancer Center will be done in the  Main lab at [place] on 1st floor. If you have a lab appointment with the Cancer Center please come in thru the  Main Entrance and check in at the main information desk   Today you received the following chemotherapy agents: Rituxan and bendamustine.     If you develop nausea and vomiting, or diarrhea that is not controlled by your medication, call the clinic.  The clinic phone number is [phone]. Office hours are Monday-Friday 8:30am-5:00pm.  BELOW ARE SYMPTOMS THAT SHOULD BE REPORTED IMMEDIATELY:  *FEVER GREATER THAN 101.0 F  *CHILLS WITH OR WITHOUT FEVER  NAUSEA AND VOMITING THAT IS NOT CONTROLLED WITH YOUR NAUSEA MEDICATION  *UNUSUAL SHORTNESS OF BREATH  *UNUSUAL BRUISING OR BLEEDING  TENDERNESS IN MOUTH AND THROAT WITH OR WITHOUT PRESENCE OF ULCERS  *URINARY PROBLEMS  *BOWEL PROBLEMS  UNUSUAL RASH Items with * indicate a potential emergency and should be followed up as soon as possible. If you have an emergency after office hours please contact your primary care physician or go to the nearest emergency department.  Please call the clinic during office hours if you have any questions or concerns.   You may also contact the Patient Navigator at [phone] should you have any questions or need assistance in obtaining follow up care.      Resources For Cancer Patients and their Caregivers ? American Cancer Society: Can assist with transportation, wigs, general needs, runs Look Good Feel Better.        1-888-227-6333 ? Cancer Care: Provides financial assistance, online support groups, medication/co-pay assistance.  1-800-813-HOPE (4673) ? [facility] Assists [place] cancer patients and their families through emotional , educational and financial support.   336-427-4357 ? [place] DSS Where to apply for food stamps, Medicaid and utility assistance. 336-342-1394 ? RCATS: Transportation to medical appointments. 336-347-2287 ? Social Security Administration: May apply for disability if have a Stage IV cancer. 336-342-7796 1-800-772-1213 ? [place] Aging, Disability and Transit Services: Assists with nutrition, care and transit needs. 336-349-2343          

## 2016-08-18 ENCOUNTER — Encounter (HOSPITAL_BASED_OUTPATIENT_CLINIC_OR_DEPARTMENT_OTHER): Payer: Medicare Other

## 2016-08-18 ENCOUNTER — Ambulatory Visit (HOSPITAL_COMMUNITY): Payer: Medicare Other

## 2016-08-18 VITALS — BP 110/56 | HR 66 | Temp 97.9°F | Resp 18 | Wt 158.9 lb

## 2016-08-18 DIAGNOSIS — D61818 Other pancytopenia: Secondary | ICD-10-CM

## 2016-08-18 DIAGNOSIS — C8597 Non-Hodgkin lymphoma, unspecified, spleen: Secondary | ICD-10-CM | POA: Diagnosis not present

## 2016-08-18 DIAGNOSIS — Z9189 Other specified personal risk factors, not elsewhere classified: Secondary | ICD-10-CM

## 2016-08-18 DIAGNOSIS — D472 Monoclonal gammopathy: Secondary | ICD-10-CM

## 2016-08-18 DIAGNOSIS — Z5111 Encounter for antineoplastic chemotherapy: Secondary | ICD-10-CM | POA: Diagnosis not present

## 2016-08-18 DIAGNOSIS — C880 Waldenstrom macroglobulinemia: Secondary | ICD-10-CM

## 2016-08-18 MED ORDER — HEPARIN SOD (PORK) LOCK FLUSH 100 UNIT/ML IV SOLN
500.0000 [IU] | Freq: Every day | INTRAVENOUS | Status: AC | PRN
  Administered 2016-08-18: 500 [IU]
  Filled 2016-08-18 (×2): qty 5

## 2016-08-18 MED ORDER — SODIUM CHLORIDE 0.9 % IV SOLN
90.0000 mg/m2 | Freq: Once | INTRAVENOUS | Status: AC
  Administered 2016-08-18: 175 mg via INTRAVENOUS
  Filled 2016-08-18: qty 7

## 2016-08-18 MED ORDER — ACETAMINOPHEN 325 MG PO TABS
650.0000 mg | ORAL_TABLET | Freq: Once | ORAL | Status: AC
  Administered 2016-08-18: 650 mg via ORAL
  Filled 2016-08-18: qty 2

## 2016-08-18 MED ORDER — SODIUM CHLORIDE 0.9 % IV SOLN
Freq: Once | INTRAVENOUS | Status: AC
  Administered 2016-08-18: 14:00:00 via INTRAVENOUS

## 2016-08-18 MED ORDER — SODIUM CHLORIDE 0.9 % IV SOLN
250.0000 mL | Freq: Once | INTRAVENOUS | Status: AC
  Administered 2016-08-18: 250 mL via INTRAVENOUS

## 2016-08-18 MED ORDER — DIPHENHYDRAMINE HCL 25 MG PO CAPS
25.0000 mg | ORAL_CAPSULE | Freq: Once | ORAL | Status: AC
  Administered 2016-08-18: 25 mg via ORAL
  Filled 2016-08-18: qty 1

## 2016-08-18 MED ORDER — SODIUM CHLORIDE 0.9 % IV SOLN
10.0000 mg | Freq: Once | INTRAVENOUS | Status: AC
  Administered 2016-08-18: 10 mg via INTRAVENOUS
  Filled 2016-08-18: qty 1

## 2016-08-18 MED ORDER — PEGFILGRASTIM 6 MG/0.6ML ~~LOC~~ PSKT
6.0000 mg | PREFILLED_SYRINGE | Freq: Once | SUBCUTANEOUS | Status: AC
  Administered 2016-08-18: 6 mg via SUBCUTANEOUS
  Filled 2016-08-18: qty 0.6

## 2016-08-18 MED ORDER — SODIUM CHLORIDE 0.9% FLUSH
10.0000 mL | INTRAVENOUS | Status: AC | PRN
  Administered 2016-08-18: 10 mL

## 2016-08-18 NOTE — Patient Instructions (Signed)
[facility] Discharge Instructions for Patients Receiving Chemotherapy   Beginning January 23rd 2017 lab work for the [facility] will be done in the  Main lab at [facility] on 1st floor. If you have a lab appointment with the [facility] please come in thru the  Main Entrance and check in at the main information desk   Today you received the following chemotherapy agents Bendamustine as well as 2 units of blood and Neulasta on-pro was applied. Follow-up as scheduled. Call clinic for any questions or concerns  To help prevent nausea and vomiting after your treatment, we encourage you to take your nausea medication   If you develop nausea and vomiting, or diarrhea that is not controlled by your medication, call the clinic.  The clinic phone number is [phone]. Office hours are Monday-Friday 8:30am-5:00pm.  BELOW ARE SYMPTOMS THAT SHOULD BE REPORTED IMMEDIATELY:  *FEVER GREATER THAN 101.0 F  *CHILLS WITH OR WITHOUT FEVER  NAUSEA AND VOMITING THAT IS NOT CONTROLLED WITH YOUR NAUSEA MEDICATION  *UNUSUAL SHORTNESS OF BREATH  *UNUSUAL BRUISING OR BLEEDING  TENDERNESS IN MOUTH AND THROAT WITH OR WITHOUT PRESENCE OF ULCERS  *URINARY PROBLEMS  *BOWEL PROBLEMS  UNUSUAL RASH Items with * indicate a potential emergency and should be followed up as soon as possible. If you have an emergency after office hours please contact your primary care physician or go to the nearest emergency department.  Please call the clinic during office hours if you have any questions or concerns.   You may also contact the Patient Navigator at [phone] should you have any questions or need assistance in obtaining follow up care.      Resources For Cancer Patients and their Caregivers ? American Cancer Society: Can assist with transportation, wigs, general needs, runs Look Good Feel Better.        (774)388-5183 ? Cancer Care: Provides financial assistance, online  support groups, medication/co-pay assistance.  1-800-813-HOPE 201-189-6505) ? [facility] Assists [place] cancer patients and their families through emotional , educational and financial support.  562-474-2299 ? [place] DSS Where to apply for food stamps, Medicaid and utility assistance. 930-078-5302 ? [employer]: Transportation to medical appointments. (323)229-6891 ? Social Security Administration: May apply for disability if have a Stage IV cancer. 534-569-9755 [phone] ? [employer], Disability and Transit Services: Assists with nutrition, care and transit needs. [phone]

## 2016-08-18 NOTE — Progress Notes (Signed)
Paul Keith tolerated chemo tx  And Blood transfusion well without complaints or incident. Neulasta on-pro applied to right arm and pt instructed in purpose,instructions and side effects. Pt verbalized understanding. VSS upon discharge. Port flushed per protocol and de=accessed. Pt discharged self ambulatory in satisfactory condition with wife

## 2016-08-19 ENCOUNTER — Other Ambulatory Visit (HOSPITAL_COMMUNITY): Payer: Self-pay | Admitting: *Deleted

## 2016-08-19 ENCOUNTER — Telehealth (HOSPITAL_COMMUNITY): Payer: Self-pay | Admitting: *Deleted

## 2016-08-19 DIAGNOSIS — C8300 Small cell B-cell lymphoma, unspecified site: Secondary | ICD-10-CM

## 2016-08-19 LAB — TYPE AND SCREEN
ABO/RH(D): O POS
ANTIBODY SCREEN: NEGATIVE
Unit division: 0
Unit division: 0

## 2016-08-19 NOTE — Telephone Encounter (Signed)
Pt called for 24 hour f/u post day 1 and 2 of first bendamustine tx.  Denies any s/s and states he feels better than he has in a long time.  Instructed to call the clinic with any questions or concerns.

## 2016-08-20 ENCOUNTER — Encounter (HOSPITAL_BASED_OUTPATIENT_CLINIC_OR_DEPARTMENT_OTHER): Payer: Medicare Other | Admitting: Hematology & Oncology

## 2016-08-20 ENCOUNTER — Encounter (HOSPITAL_COMMUNITY): Payer: Medicare Other

## 2016-08-20 VITALS — BP 114/64 | HR 53 | Temp 97.7°F | Resp 20 | Wt 162.8 lb

## 2016-08-20 DIAGNOSIS — R161 Splenomegaly, not elsewhere classified: Secondary | ICD-10-CM

## 2016-08-20 DIAGNOSIS — D472 Monoclonal gammopathy: Secondary | ICD-10-CM | POA: Diagnosis not present

## 2016-08-20 DIAGNOSIS — C8300 Small cell B-cell lymphoma, unspecified site: Secondary | ICD-10-CM

## 2016-08-20 DIAGNOSIS — D61818 Other pancytopenia: Secondary | ICD-10-CM | POA: Diagnosis not present

## 2016-08-20 DIAGNOSIS — D709 Neutropenia, unspecified: Secondary | ICD-10-CM

## 2016-08-20 DIAGNOSIS — K5780 Diverticulitis of intestine, part unspecified, with perforation and abscess without bleeding: Secondary | ICD-10-CM

## 2016-08-20 DIAGNOSIS — K5790 Diverticulosis of intestine, part unspecified, without perforation or abscess without bleeding: Secondary | ICD-10-CM

## 2016-08-20 DIAGNOSIS — C880 Waldenstrom macroglobulinemia: Secondary | ICD-10-CM

## 2016-08-20 DIAGNOSIS — Z9189 Other specified personal risk factors, not elsewhere classified: Secondary | ICD-10-CM

## 2016-08-20 LAB — COMPREHENSIVE METABOLIC PANEL
ALT: 17 U/L (ref 17–63)
AST: 14 U/L — AB (ref 15–41)
Albumin: 3.2 g/dL — ABNORMAL LOW (ref 3.5–5.0)
Alkaline Phosphatase: 55 U/L (ref 38–126)
Anion gap: 6 (ref 5–15)
BUN: 33 mg/dL — AB (ref 6–20)
CHLORIDE: 104 mmol/L (ref 101–111)
CO2: 24 mmol/L (ref 22–32)
Calcium: 9 mg/dL (ref 8.9–10.3)
Creatinine, Ser: 1.35 mg/dL — ABNORMAL HIGH (ref 0.61–1.24)
GFR, EST AFRICAN AMERICAN: 54 mL/min — AB (ref >60)
GFR, EST NON AFRICAN AMERICAN: 47 mL/min — AB (ref >60)
Glucose, Bld: 127 mg/dL — ABNORMAL HIGH (ref 65–99)
POTASSIUM: 4 mmol/L (ref 3.5–5.1)
Sodium: 134 mmol/L — ABNORMAL LOW (ref 135–145)
Total Bilirubin: 0.7 mg/dL (ref 0.3–1.2)
Total Protein: 6.1 g/dL — ABNORMAL LOW (ref 6.5–8.1)

## 2016-08-20 LAB — CBC WITH DIFFERENTIAL/PLATELET
BASOS ABS: 0 10*3/uL (ref 0.0–0.1)
Basophils Relative: 0 %
EOS ABS: 0 10*3/uL (ref 0.0–0.7)
Eosinophils Relative: 0 %
HCT: 26.1 % — ABNORMAL LOW (ref 39.0–52.0)
Hemoglobin: 8.8 g/dL — ABNORMAL LOW (ref 13.0–17.0)
LYMPHS ABS: 0.1 10*3/uL — AB (ref 0.7–4.0)
Lymphocytes Relative: 18 %
MCH: 30.9 pg (ref 26.0–34.0)
MCHC: 33.7 g/dL (ref 30.0–36.0)
MCV: 91.6 fL (ref 78.0–100.0)
MONOS PCT: 10 %
Monocytes Absolute: 0.1 10*3/uL (ref 0.1–1.0)
Neutro Abs: 0.6 10*3/uL — ABNORMAL LOW (ref 1.7–7.7)
Neutrophils Relative %: 72 %
Platelets: 124 10*3/uL — ABNORMAL LOW (ref 150–400)
RBC: 2.85 MIL/uL — ABNORMAL LOW (ref 4.22–5.81)
RDW: 18.6 % — AB (ref 11.5–15.5)
WBC: 0.8 10*3/uL — CL (ref 4.0–10.5)

## 2016-08-20 LAB — URIC ACID: URIC ACID, SERUM: 2.8 mg/dL — AB (ref 4.4–7.6)

## 2016-08-20 LAB — SAMPLE TO BLOOD BANK

## 2016-08-20 NOTE — Progress Notes (Signed)
CRITICAL VALUE ALERT Critical value received:  Wbc 0.8 Date of notification:  08/20/16 Time of notification: 1109 Critical value read back:  Yes.   Nurse who received alert:  Isidoro Donning  RN MD notified: Dr. Whitney Muse [id number]

## 2016-08-20 NOTE — Patient Instructions (Addendum)
[place] at [facility] Discharge Instructions  RECOMMENDATIONS MADE BY THE CONSULTANT AND ANY TEST RESULTS WILL BE SENT TO YOUR REFERRING PHYSICIAN.  CBC with diff every Wednesday with a blood bank sample (pink bracelet)  Return to clinic on Cycle 2 Day 1, labs, chemo  Attached are instructions on neutropenia.   Don't ignore a fever of 100.5 or greater. Come to the Emergency Department.   Thank you for choosing [place] at [facility] to provide your oncology and hematology care.  To afford each patient quality time with our provider, please arrive at least 15 minutes before your scheduled appointment time.   Beginning January 23rd 2017 lab work for the [name] will be done in the  Main lab at [facility] on 1st floor. If you have a lab appointment with the [facility] please come in thru the  Main Entrance and check in at the main information desk  You need to re-schedule your appointment should you arrive 10 or more minutes late.  We strive to give you quality time with our providers, and arriving late affects you and other patients whose appointments are after yours.  Also, if you no show three or more times for appointments you may be dismissed from the clinic at the providers discretion.     Again, thank you for choosing [facility].  Our hope is that these requests will decrease the amount of time that you wait before being seen by our physicians.       _____________________________________________________________  Should you have questions after your visit to [facility], please contact our office at [phone] between the hours of 8:30 a.m. and 4:30 p.m.  Voicemails left after 4:30 p.m. will not be returned until the following business day.  For prescription refill requests, have your pharmacy contact our office.         Resources For Cancer Patients and their Caregivers ? American Cancer  Society: Can assist with transportation, wigs, general needs, runs Look Good Feel Better.        208-479-0636 ? Cancer Care: Provides financial assistance, online support groups, medication/co-pay assistance.  1-800-813-HOPE (217)855-5210) ? [place] Assists [place] cancer patients and their families through emotional , educational and financial support.  (434) 291-2314 ? [place] DSS Where to apply for food stamps, Medicaid and utility assistance. 2012020102 ? [facility]: Transportation to medical appointments. 9785405970 ? Social Security Administration: May apply for disability if have a Stage IV cancer. 626-476-3614 (785)038-8618 ? [employer], Disability and Transit Services: Assists with nutrition, care and transit needs. [place] Support Programs: @10RELATIVEDAYS @ > Cancer Support Group  2nd Tuesday of the month 1pm-2pm, Journey Room  > Creative Journey  3rd Tuesday of the month 1130am-1pm, Journey Room  > Look Good Feel Better  1st Wednesday of the month 10am-12 noon, Journey Room (Call [place] to register [phone])  Neutropenia Neutropenia is a condition that occurs when the level of a certain type of white blood cell (neutrophil) in your body becomes lower than normal. Neutrophils are made in the bone marrow and fight infections. These cells protect against bacteria and viruses. The fewer neutrophils you have, and the longer your body remains without them, the greater your risk of getting a severe infection becomes. CAUSES  The cause of neutropenia may be hard to determine. However, it is usually due to 3 main problems:  Decreased production of neutrophils. This may be due to:  Certain medicines such as chemotherapy.  Genetic problems.  Cancer.  Radiation treatments.  Vitamin deficiency.  Some pesticides.  Increased destruction of neutrophils. This may be due to:  Overwhelming  infections.  Hemolytic anemia. This is when the body destroys its own blood cells.  Chemotherapy.  Neutrophils moving to areas of the body where they cannot fight infections. This may be due to:  Dialysis procedures.  Conditions where the spleen becomes enlarged. Neutrophils are held in the spleen and are not available to the rest of the body.  Overwhelming infections. The neutrophils are held in the area of the infection and are not available to the rest of the body. SYMPTOMS  There are no specific symptoms of neutropenia. The lack of neutrophils can result in an infection, and an infection can cause various problems. DIAGNOSIS  Diagnosis is made by a blood test. A complete blood count is performed. The normal level of neutrophils in human blood differs with age and race. Infants have lower counts than older children and adults. African Americans have lower counts than Caucasians or Asians. The average adult level is 1500 cells/mm3 of blood. Neutrophil counts are interpreted as follows:  Greater than 1000 cells/mm3 gives normal protection against infection.  500 to 1000 cells/mm3 gives an increased risk for infection.  200 to 500 cells/mm3 is a greater risk for severe infection.  Lower than 200 cells/mm3 is a marked risk of infection. This may require hospitalization and treatment with antibiotic medicines. TREATMENT  Treatment depends on the underlying cause, severity, and presence of infections or symptoms. It also depends on your health. Your caregiver will discuss the treatment plan with you. Mild cases are often easily treated and have a good outcome. Preventative measures may also be started to limit your risk of infections. Treatment can include:  Taking antibiotics.  Stopping medicines that are known to cause neutropenia.  Correcting nutritional deficiencies by eating green vegetables to supply folic acid and taking vitamin B supplements.  Stopping exposure to pesticides if  your neutropenia is related to pesticide exposure.  Taking a blood growth factor called sargramostim, pegfilgrastim, or filgrastim if you are undergoing chemotherapy for cancer. This stimulates white blood cell production.  Removal of the spleen if you have Felty's syndrome and have repeated infections. HOME CARE INSTRUCTIONS   Follow your caregiver's instructions about when you need to have blood work done.  Wash your hands often. Make sure others who come in contact with you also wash their hands.  Wash raw fruits and vegetables before eating them. They can carry bacteria and fungi.  Avoid people with colds or spreadable (contagious) diseases (chickenpox, herpes zoster, influenza).  Avoid large crowds.  Avoid construction areas. The dust can release fungus into the air.  Be cautious around children in daycare or school environments.  Take care of your respiratory system by coughing and deep breathing.  Bathe daily.  Protect your skin from cuts and burns.  Do not work in the garden or with flowers and plants.  Care for the mouth before and after meals by brushing with a soft toothbrush. If you have mucositis, do not use mouthwash. Mouthwash contains alcohol and can dry out the mouth even more.  Clean the area between the genitals and the anus (perineal area) after urination and bowel movements. Women need to wipe from front to back.  Use a water soluble lubricant during sexual intercourse and practice good hygiene after. Do  not have intercourse if you are severely neutropenic. Check with your caregiver for guidelines.  Exercise daily as tolerated.  Avoid people who were vaccinated with a live vaccine in the past 30 days. You should not receive live vaccines (polio, typhoid).  Do not provide direct care for pets. Avoid animal droppings. Do not clean litter boxes and bird cages.  Do not share food utensils.  Do not use tampons, enemas, or rectal suppositories unless directed  by your caregiver.  Use an electric razor to remove hair.  Wash your hands after handling magazines, letters, and newspapers. SEEK IMMEDIATE MEDICAL CARE IF:   You have a fever.  You have chills or start to shake.  You feel nauseous or vomit.  You develop mouth sores.  You develop aches and pains.  You have redness and swelling around open wounds.  Your skin is warm to the touch.  You have pus coming from your wounds.  You develop swollen lymph nodes.  You feel weak or fatigued.  You develop red streaks on the skin. MAKE SURE YOU:  Understand these instructions.  Will watch your condition.  Will get help right away if you are not doing well or get worse.   This information is not intended to replace advice given to you by your health care provider. Make sure you discuss any questions you have with your health care provider.   Document Released: 04/23/2002 Document Revised: 01/24/2012 Document Reviewed: 05/14/2015 Elsevier Interactive Patient Education 2016 [place] for the Immunocompromised Person If you are immunocompromised, it is important to follow food safety guidelines. Bacteria and other harmful germs are more likely to be in raw or fresh foods. Thoroughly cooking foods destroys these germs. Fresh vegetables should be cooked until tender; meats should be cooked until well-done; and eggs should be cooked until the yolks are firm. Dairy products, juices, and ciders should have the word "pasteurized" on the label. The following information can help you choose the right foods and prepare them correctly in order to keep you healthy. WHAT DO I NEED TO KNOW ABOUT FOOD SAFETY?  Wash your hands with soap and water before and after preparing food. Always wash your hands after touching raw meat.  Wash any surfaces that you will be using to prepare food. Use hot, soapy water.  Keep foods separate when you are preparing and cooking a meal. Do not use the  same knife or cutting board to cut your fresh produce and raw meat.  Cook food to the right temperature:  Beef, pork, veal, lamb, and steak should be cooked to 145F (63C) with a 3-minute rest time.  Fish should be cooked to 145F.  Ground beef, pork, veal, and lamb should be cooked to 160F (71C).  Egg dishes should be cooked to 160F.  Poultry (whole, pieces, and ground) should be cooked to 165F (74C).  Put any leftovers in the refrigerator as soon as possible to stop bacteria from growing and to keep your food from going bad.  Hot foods should be kept at 135F (60C) or more, and cold foods should be kept at 35F (4.4C) or less. PREPARATION GUIDELINES Cooking and Eating Utensil Preparation  Wash the following with soap and hot water before and after use:  Countertops.  Contractor.  Cooking utensils.  Silverware.  Flatware.  Pots and pans.  Dishes.  Glassware.  Air dry all cooking and eating utensils. Do not dry them with a cloth towel. Food Preparation  Do not  buy food that has passed the expiration or "use by" date.  Wash your hands often for at least 20 seconds with warm, soapy water and dry them with paper towels. This is especially important after you have touched raw meat, eggs, or fish.  Wash fruits and vegetables thoroughly under cold running water before peeling or cutting them. Individually scrub produce that has a thick, rough skin or rind, such as cabbage. Do not use commercial rinses to wash fruits and vegetables.  Rinse packaged salads, slaw mix, and other prepared produce under cold running water. Do this even if the food is labeled "prewashed."  Thaw frozen foods in the refrigerator overnight or quickly in the microwave. Do not thaw food on countertops.  Do not touch or use raw yeast. There is a risk that you could breathe it in. Raw yeast is used to make bread.  Cook all perishable foods thoroughly.  Do not leave easily spoiled items  at room temperature for more than 10-15 minutes.  Refrigerate leftovers as soon as possible in small, airtight, shallow containers.  Eat leftovers only if they have been stored properly. Do not eat leftovers that have been around for longer than 24 hours.  Boil marinades before using them on raw foods.  Clean the outside of your canned goods before opening them.  Do not put cooked food on a surface that you had placed raw meat, fish, or eggs on. Those surfaces must be washed with warm, soapy water before you use them again.  Always place raw meat, seafood, and eggs in plastic bags before putting them in your shopping cart at the grocery story. Also, bag those items separately and not with other food you have purchased. Once home, place them in your refrigerator right away. WHAT FOODS CAN I NOT EAT? Grains  Fresh bakery breads, muffins, cakes, donuts, and cream- or custard-filled cakes.  Raw or uncooked grain products.  Beer that has "unpasteurized" on the label, is made with uncooked brewer's yeast, is homemade or home brewed, or is from a microbrewery. Vegetables  Unwashedraw vegetables and salads.  Unpasteurized vegetable juice.  Raw vegetable sprouts, such as alfalfa, radish, broccoli, and mung bean.  Salads from the deli or salad bar. Fruits  Unwashed raw fruit.  Unpasteurized fruit juices.  Fresh apple cider. Meat and Other Protein Sources  All raw, uncooked, undercooked, or rare meat, fish, eggs, poultry, or tofu. This includes:  Sushi.  Partially cooked seafood, such as shrimp and crab.  Raw shellfish, such as oysters, clams, mussels, and scallops and their juices.  Refrigerated smoked seafood, including smoked salmon and lox.  Unpasteurized, refrigerated pates or meat spreads.  Unheated cold cuts from the deli, including hot dogs, dry or fermented sausage, or other deli meat. These are okay if you heat them until they are steaming or reach 165F.  Hard  cured salami in natural wrap.  Any meat, poultry, or seafood salad made at the grocery store or at [employer].  Pickled fish.  Any fermented foods such as tempeh or miso products.  Unprocessed nuts, unroasted raw nuts, and roasted nuts in the shell.  Food products made with raw or undercooked eggs, such as Caesar salad dressing, mayonnaise, homemade cookie dough, cake batters, and eggnog. While most products at the grocery store are made with pasteurized eggs, you still need to read the label to make sure. Do not eat anything that has the word "unpasteurized" on the label. Dairy  Soft cheeses made from unpasteurized  milk or molds (such as feta, Brie, Camembert, and Gorgonzola), blue-veined cheese (such as Stilton and Roquefort), Mexican-style cheeses (such as [place]), and farmer's cheese.  Unpasteurized or raw milk cheese, yogurt, and other milk products.  Cheeses containing chili peppers or other uncooked vegetables.  Any imported cheeses.  Any cheese sliced at a deli. Beverages  Unboiled well water.  Cold-brewed tea or "sun teas" made with warm or cold water.  Mate tea or yerba mate tea.  Raw, unpasteurized milk.  Eggnog or milkshakes made with raw eggs.  Unpasteurized fruit and vegetable juices.  Fresh apple cider.  Wine or beer that is unpasteurized, homemade or home brewed, or from a microbrewery. Condiments  Uncooked herbs or spices.  Raw or unpasteurized honey.  Prepackaged salsas stored in a refrigerated case. Sweets/Desserts  Unrefrigerated custard or cream-filled pastry products.  Soft-serve ice cream or frozen yogurt.  Hand-packed ice cream or frozen yogurt. Fats  Fresh salad dressings containing raw eggs or aged cheese (such as blue cheese and Roquefort) stored in a refrigerated case.   This information is not intended to replace advice given to you by your health care provider. Make sure you discuss any questions you have with your  health care provider.   Document Released: 08/29/2007 Document Revised: 11/22/2014 Document Reviewed: 04/02/2014 Elsevier Interactive Patient Education [employer].

## 2016-08-20 NOTE — Progress Notes (Signed)
HEMATOLOGY/ONCOLOGY PROGRESS NOTE  Date of Service: 08/20/2016  Patient Care Team: Octavio Graves, DO as PCP - General  CHIEF COMPLAINTS:  NHL Pancytopenia BMBX on 05/06/2016 with extensive involvement by NHL B cell, admixed plasma cell component to a lesser extent which also shows kappa light chain restriction. DDX lymphoplasmacytic lymphoma, marginal zone lymphoma, follicular lymphoma with plasma cell differentiation, splenic lymphoma. Low grade process is favored.  HISTORY OF PRESENTING ILLNESS:  Paul Keith is a wonderful 82 y.o. male who has been referred to [place] by Dr .Octavio Graves, DO for further follow-up of NHL vs lymphoplasmacytic lymphoma. Bmbx heavily involved and Ddx includes lymphoplasmacytic lymphoma, marginal zone lymphoma, follicular lymphoma with plasma cell differentiation, splenic lymphoma. Course has been complicated by diverticulitis and diverticular abscess.  He was treated with his first cycle of Bendamustine/Rituxan.   Mr. Mackowski is accompanied by his wife and daughter. His daughter phone recorded our visit today. I personally reviewed and went over laboratory and imaging studies with the patient. CT scans were ordered by Dr. Barry Dienes.   "I have felt good for the last two days after getting that blood". His daughter notes he has been good ol' [name] again. He had no problems with chemotherapy. No nausea, vomiting. He denies abdominal pain.   He has not had a beer in 3.5 months. He avoids eating seeds or nuts. He eats a low fiber diet. "I can't even have blueberries on my cereal in the morning". He misses eating nuts and fruits.   A cousin is getting married next Saturday. He was treated Wednesday. His daughter questions if he will be able to attend this wedding. In the next two weeks his granddaughter is getting married - referring to this as the big wedding. His daughter asks if he is allowed to have a beer again.    MEDICAL HISTORY:  Past Medical History:    Diagnosis Date  . Anemia 04/22/2016  . Diverticulosis    pt reports recent admission for diverticulosis perforation this past week  . 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 Pancytopenia ([facility]) 04/22/2016  BPH L4-L5 fracture 6 weeks ago Vasomotor rhinitis for several years.  SURGICAL HISTORY: Past Surgical History:  Procedure Laterality Date  . CATARACT EXTRACTION Bilateral   . TONSILLECTOMY      SOCIAL HISTORY: Social History   Social History  . Marital status: Married    Spouse name: N/A  . Number of children: N/A  . Years of education: N/A   Occupational History  . Not on file.   Social History Main Topics  . Smoking status: Former Smoker    Quit date: 09/15/1962  . Smokeless tobacco: Never Used  . Alcohol use Yes     Comment: OCCASSIONAL  . Drug use: No  . Sexual activity: Not on file   Other Topics Concern  . Not on file   Social History Narrative  . No narrative on file    FAMILY HISTORY: No family history on file.  ALLERGIES:  has No Known Allergies.  MEDICATIONS:  Current Outpatient Prescriptions  Medication Sig Dispense Refill  . acyclovir (ZOVIRAX) 400 MG tablet Take 1 tablet (400 mg total) by mouth daily. 30 tablet 3  . allopurinol (ZYLOPRIM) 300 MG tablet Take 1 tablet (300 mg total) by mouth 2 (two) times daily. 60 tablet 1  . bendamustine in sodium chloride 0.9 % 50 mL Inject into the vein once. Every 28 days    .  cyanocobalamin 100 MCG tablet Take by mouth daily. Pt reports unsure of dosage, takes once daily    . dexamethasone (DECADRON) 4 MG tablet Take 2 tablets (8 mg total) by mouth daily. Start the day after bendamustine chemotherapy for 2 days. Take with food. 30 tablet 1  . furosemide (LASIX) 20 MG tablet Take 20 mg by mouth daily.    . ondansetron (ZOFRAN) 8 MG tablet Take 1 tablet (8 mg total) by mouth 2 (two) times daily as needed for nausea or vomiting. Start on day 2  after bendamustine. 30 tablet 1  . prochlorperazine (COMPAZINE) 10 MG tablet Take 1 tablet (10 mg total) by mouth every 6 (six) hours as needed (Nausea or vomiting). 30 tablet 1  . RiTUXimab (RITUXAN IV) Inject into the vein.    . tamsulosin (FLOMAX) 0.4 MG CAPS capsule Take 0.4 mg by mouth.    . traMADol (ULTRAM) 50 MG tablet   0   No current facility-administered medications for this visit.     REVIEW OF SYSTEMS:   Review of Systems  Constitutional: Negative.   HENT: Negative.   Eyes: Negative.   Respiratory: Negative.   Cardiovascular: Negative.   Gastrointestinal: Negative.   Genitourinary: Negative.   Musculoskeletal: Negative.   Skin: Negative.   Neurological: Negative.   Endo/Heme/Allergies: Negative.   Psychiatric/Behavioral: Negative.   All other systems reviewed and are negative. 14 point review of systems was performed and is negative except as detailed under history of present illness and above   PHYSICAL EXAMINATION: ECOG PERFORMANCE STATUS: 1 - Symptomatic but completely ambulatory   Vitals with BMI 08/20/2016  Height   Weight 162 lbs 13 oz  BMI   Systolic 627  Diastolic 64  Pulse 53  Respirations 20    Physical Exam  Constitutional: He is oriented to person, place, and time and well-developed, well-nourished, and in no distress.  HENT:  Head: Normocephalic and atraumatic.  Nose: Nose normal.  Mouth/Throat: Oropharynx is clear and moist. No oropharyngeal exudate.  Eyes: Conjunctivae and EOM are normal. Pupils are equal, round, and reactive to light. Right eye exhibits no discharge. Left eye exhibits no discharge. No scleral icterus.  Neck: Normal range of motion. Neck supple. No tracheal deviation present. No thyromegaly present.  Cardiovascular: Normal rate, regular rhythm and normal heart sounds.  Exam reveals no gallop and no friction rub.   No murmur heard. Pulmonary/Chest: Effort normal and breath sounds normal. He has no wheezes. He has no rales.    Abdominal: Soft. Bowel sounds are normal. He exhibits no distension and no mass. There is no tenderness. There is no rebound and no guarding.  Musculoskeletal: Normal range of motion. He exhibits no edema.  Lymphadenopathy:    He has no cervical adenopathy.  Neurological: He is alert and oriented to person, place, and time. He has normal reflexes. No cranial nerve deficit. Gait normal. Coordination normal.  Skin: Skin is warm and dry. No rash noted.  Psychiatric: Mood, memory, affect and judgment normal.  Nursing note and vitals reviewed.   LABORATORY DATA:  I have reviewed the data as listed Results for MICHAELJOHN, BISS (MRN [id number]) as of 08/20/2016 11:17  Ref. Range 08/20/2016 10:17  Sodium Latest Ref Range: 135 - 145 mmol/L 134 (L)  Potassium Latest Ref Range: 3.5 - 5.1 mmol/L 4.0  Chloride Latest Ref Range: 101 - 111 mmol/L 104  CO2 Latest Ref Range: 22 - 32 mmol/L 24  BUN Latest Ref Range: 6 - 20 mg/dL 33 (  H)  Creatinine Latest Ref Range: 0.61 - 1.24 mg/dL 1.35 (H)  Calcium Latest Ref Range: 8.9 - 10.3 mg/dL 9.0  EGFR (Non-African Amer.) Latest Ref Range: >60 mL/min 47 (L)  EGFR (African American) Latest Ref Range: >60 mL/min 54 (L)  Glucose Latest Ref Range: 65 - 99 mg/dL 127 (H)  Anion gap Latest Ref Range: 5 - 15  6  Alkaline Phosphatase Latest Ref Range: 38 - 126 U/L 55  Albumin Latest Ref Range: 3.5 - 5.0 g/dL 3.2 (L)  AST Latest Ref Range: 15 - 41 U/L 14 (L)  ALT Latest Ref Range: 17 - 63 U/L 17  Total Protein Latest Ref Range: 6.5 - 8.1 g/dL 6.1 (L)  Total Bilirubin Latest Ref Range: 0.3 - 1.2 mg/dL 0.7  WBC Latest Ref Range: 4.0 - 10.5 K/uL 0.8 (LL)  RBC Latest Ref Range: 4.22 - 5.81 MIL/uL 2.85 (L)  Hemoglobin Latest Ref Range: 13.0 - 17.0 g/dL 8.8 (L)  HCT Latest Ref Range: 39.0 - 52.0 % 26.1 (L)  MCV Latest Ref Range: 78.0 - 100.0 fL 91.6  MCH Latest Ref Range: 26.0 - 34.0 pg 30.9  MCHC Latest Ref Range: 30.0 - 36.0 g/dL 33.7  RDW Latest Ref Range: 11.5 - 15.5  % 18.6 (H)  Platelets Latest Ref Range: 150 - 400 K/uL 124 (L)  Neutrophils Latest Units: % 72  Lymphocytes Latest Units: % 18  Monocytes Relative Latest Units: % 10  Eosinophil Latest Units: % 0  Basophil Latest Units: % 0  NEUT# Latest Ref Range: 1.7 - 7.7 K/uL 0.6 (L)  Lymphocyte # Latest Ref Range: 0.7 - 4.0 K/uL 0.1 (L)  Monocyte # Latest Ref Range: 0.1 - 1.0 K/uL 0.1  Eosinophils Absolute Latest Ref Range: 0.0 - 0.7 K/uL 0.0  Basophils Absolute Latest Ref Range: 0.0 - 0.1 K/uL 0.0     RADIOGRAPHIC STUDIES: I have personally reviewed the radiological images as listed and agreed with the findings in the report. Ct Abdomen Pelvis W Contrast  Result Date: 08/17/2016 CLINICAL DATA:  Recent diverticulitis, history of lymphoma EXAM: CT ABDOMEN AND PELVIS WITH CONTRAST TECHNIQUE: Multidetector CT imaging of the abdomen and pelvis was performed using the standard protocol following bolus administration of intravenous contrast. CONTRAST:  133m ISOVUE-300 IOPAMIDOL (ISOVUE-300) INJECTION 61% COMPARISON:  06/07/2016, 05/16/2016 FINDINGS: Lower chest:  No acute abnormality. Hepatobiliary: No focal hepatic abnormality or biliary dilatation. Gallbladder and biliary system unremarkable. Pancreas: Unremarkable. No pancreatic ductal dilatation or surrounding inflammatory changes. Spleen: Spleen remains enlarged measuring 15 cm in craniocaudal length, previously 17 cm. Stable enlargement of the splenic vein and portal vein which may be secondary to the chronic splenomegaly versus a component of portal hypertension. No significant varices demonstrated Adrenals/Urinary Tract: Normal adrenal glands. Kidneys demonstrate symmetric normal enhancement and excretion. No renal obstruction or hydronephrosis. No obstructing ureteral calculus. Stable mild distention of the right distal ureter in the pelvis compared to several prior exams, image 70. This remains nonspecific. Bladder is underdistended paired grossly  unremarkable. Stomach/Bowel: Negative bowel obstruction, significant dilatation, ileus, or free air. Colonic diverticulosis noted again, most pronounced in the sigmoid region. Previous sigmoid diverticulitis shows improvement in the left hemipelvis. Small amount of residual entrapped abdominal ascites within the central mesentery and dependent pelvic free fluid remains nonspecific. Single focus of entrapped air remains within the pericolonic fat, image 77 compatible with sequelae from the prior perforation. Vascular/Lymphatic: Stable atherosclerosis of the abdominal aorta without aneurysm or dissection. Negative for adenopathy. Reproductive: Stable prostate enlargement. Other:  No inguinal abnormality or hernia.  Intact abdominal wall. Musculoskeletal: Stable chronic L4 and L5 compression fractures. Stable chronic multilevel facet arthropathy of the lumbar spine. No acute osseous finding. IMPRESSION: Resolving/improved sigmoid diverticulitis compared to 05/16/2016 and 06/07/2016. Stable small amount of abdominal pelvic ascites. Small residual entrapped focus of extraluminal air within the pelvis at the previous perforation compatible with sequelae from prior perforated diverticulitis. No current abdominal or pelvic abscess. Stable splenomegaly Abdominal atherosclerosis Electronically Signed   By: Jerilynn Mages.  Shick M.D.   On: 08/17/2016 09:15      ASSESSMENT & PLAN:  NHL, low grade, heavy marrow involvement Pancytopenia with neutropenia IgM Kappa monoclonal gammopathy L4-L5 vertebral fracture Splenomegaly secondary to NHL Diffuse osseous uptake on PET secondary to NHL Recent diverticulitis/diverticular abscess  82 year old Caucasian male with good performance status with minimal chronic medical comorbidities with  #1 Pancytopenia with marrow showing lymphocytic infiltration concerning for lymphoproliferative process. LPL and MCL in the top differentials. He received multiple cycles of Rituxan but with little  improvement in counts. Bendamustine has been added with good tolerance. I am hopeful counts should improve over the next several months. If he continues to need blood we may consider adding aranesp. He will continue to require neulasta.   #2 IgM Kappa monoclonal gammopathy with preliminary bone marrow picture showing lymphocytic infiltration concerning for Waldenstrom's Macroglobulinemia/lymphoplasmacytic lymphoma. IgM levels today have just crossed 2 g/dL. Patient has no clinical symptoms of hyperviscosity which I would not expect that these protein levels.  As treatment progresses will continue to monitor.   Neutropenia and fever were again reviewed with the patient and his family.   08/17/2016 CT Abdomen Pelvis reviewed. Results are noted in this dictation.  I sent a note to Dr. Barry Dienes asking how the patient should proceed with his diet. He is not currently scheduled to follow up with Dr. Barry Dienes.  Weight gain of 9 lbs since 07/23/2016.  He is scheduled to meet with Dr. Lucio Edward of GI on 10/14/2016.  He will return for blood work next week (Wednesday) and will continue to have his blood checked weekly.   He is scheduled to return for follow up on 09/14/2016.   Orders Placed This Encounter  Procedures  . Sample to Blood Bank    Standing Status:   Standing    Number of Occurrences:   12    Standing Expiration Date:   08/20/2017    All of the patients and his family's questions were answered in details to their apparent satisfaction. The patient knows to call the clinic with any problems, questions or concerns.  This document serves as a record of services personally performed by Ancil Linsey, MD. It was created on her behalf by Arlyce Harman, a trained medical scribe. The creation of this record is based on the scribe's personal observations and the provider's statements to them. This document has been checked and approved by the attending provider.  I have reviewed the above  documentation for accuracy and completeness, and I agree with the above.  Kelby Fam. Penland, MD  08/20/2016 9:30 AM

## 2016-08-22 ENCOUNTER — Encounter (HOSPITAL_COMMUNITY): Payer: Self-pay | Admitting: Hematology & Oncology

## 2016-08-23 ENCOUNTER — Telehealth (HOSPITAL_COMMUNITY): Payer: Self-pay | Admitting: Emergency Medicine

## 2016-08-23 ENCOUNTER — Other Ambulatory Visit (HOSPITAL_COMMUNITY): Payer: Self-pay | Admitting: Emergency Medicine

## 2016-08-23 ENCOUNTER — Encounter (HOSPITAL_COMMUNITY): Payer: Medicare Other

## 2016-08-23 DIAGNOSIS — D61818 Other pancytopenia: Secondary | ICD-10-CM | POA: Diagnosis not present

## 2016-08-23 DIAGNOSIS — C880 Waldenstrom macroglobulinemia: Secondary | ICD-10-CM

## 2016-08-23 DIAGNOSIS — C8300 Small cell B-cell lymphoma, unspecified site: Secondary | ICD-10-CM

## 2016-08-23 DIAGNOSIS — D472 Monoclonal gammopathy: Secondary | ICD-10-CM

## 2016-08-23 LAB — COMPREHENSIVE METABOLIC PANEL
ALBUMIN: 3 g/dL — AB (ref 3.5–5.0)
ALK PHOS: 56 U/L (ref 38–126)
ALT: 13 U/L — ABNORMAL LOW (ref 17–63)
AST: 12 U/L — AB (ref 15–41)
Anion gap: 4 — ABNORMAL LOW (ref 5–15)
BILIRUBIN TOTAL: 1 mg/dL (ref 0.3–1.2)
BUN: 33 mg/dL — AB (ref 6–20)
CALCIUM: 8.4 mg/dL — AB (ref 8.9–10.3)
CO2: 25 mmol/L (ref 22–32)
Chloride: 103 mmol/L (ref 101–111)
Creatinine, Ser: 1.37 mg/dL — ABNORMAL HIGH (ref 0.61–1.24)
GFR calc Af Amer: 53 mL/min — ABNORMAL LOW (ref >60)
GFR calc non Af Amer: 46 mL/min — ABNORMAL LOW (ref >60)
GLUCOSE: 118 mg/dL — AB (ref 65–99)
Potassium: 4 mmol/L (ref 3.5–5.1)
SODIUM: 132 mmol/L — AB (ref 135–145)
TOTAL PROTEIN: 5.7 g/dL — AB (ref 6.5–8.1)

## 2016-08-23 LAB — CBC WITH DIFFERENTIAL/PLATELET
BASOS PCT: 0 %
Basophils Absolute: 0 10*3/uL (ref 0.0–0.1)
EOS PCT: 3 %
Eosinophils Absolute: 0 10*3/uL (ref 0.0–0.7)
HEMATOCRIT: 25.9 % — AB (ref 39.0–52.0)
HEMOGLOBIN: 8.7 g/dL — AB (ref 13.0–17.0)
LYMPHS PCT: 18 %
Lymphs Abs: 0.1 10*3/uL — ABNORMAL LOW (ref 0.7–4.0)
MCH: 31.2 pg (ref 26.0–34.0)
MCHC: 33.6 g/dL (ref 30.0–36.0)
MCV: 92.8 fL (ref 78.0–100.0)
MONOS PCT: 23 %
Monocytes Absolute: 0.2 10*3/uL (ref 0.1–1.0)
NEUTROS PCT: 56 %
Neutro Abs: 0.5 10*3/uL — ABNORMAL LOW (ref 1.7–7.7)
Platelets: 132 10*3/uL — ABNORMAL LOW (ref 150–400)
RBC: 2.79 MIL/uL — ABNORMAL LOW (ref 4.22–5.81)
RDW: 18.5 % — ABNORMAL HIGH (ref 11.5–15.5)
WBC: 0.8 10*3/uL — CL (ref 4.0–10.5)

## 2016-08-23 NOTE — Progress Notes (Unsigned)
CRITICAL VALUE ALERT Critical value received:  WBC 0.8 Date of notification:  08/23/16 Time of notification: 8032 Critical value read back:  Yes.   Nurse who received alert:  T.Nikolas Casher,RN MD notified (1st page):  1400 Dr.Penland

## 2016-08-23 NOTE — Progress Notes (Signed)
Please let patient know that diverticulitis looks nearly resolved.  Would continue low fiber diet for another 2-3 weeks, then start to liberalize.  Ok to resume chemo.

## 2016-08-23 NOTE — Telephone Encounter (Signed)
Called with results of lab work.  WBC is low.  Make sure good hand washing, monitor for fever over 100.5, avoid crowds and sick people.  We will keep scheduled blood work for Wednesday.  Verbalized understanding.

## 2016-08-23 NOTE — Telephone Encounter (Signed)
pts wife called and stated that pt is very weak and tired.  That can be normal.  Said that anus was hurting and he had fresh red blood on the toilet paper when he wiped.  I told them to make sure he was taking a stool softner every day.  I made a lab appt to check his lab work today.  I will call them with the results later today.

## 2016-08-25 ENCOUNTER — Other Ambulatory Visit (HOSPITAL_COMMUNITY): Payer: Self-pay | Admitting: *Deleted

## 2016-08-25 ENCOUNTER — Other Ambulatory Visit (HOSPITAL_COMMUNITY): Payer: Medicare Other

## 2016-08-25 ENCOUNTER — Emergency Department (HOSPITAL_COMMUNITY): Payer: Medicare Other

## 2016-08-25 ENCOUNTER — Emergency Department (HOSPITAL_COMMUNITY)
Admission: EM | Admit: 2016-08-25 | Discharge: 2016-08-25 | Disposition: A | Discharge status: Home / Self Care | Payer: Medicare Other | Attending: Emergency Medicine | Admitting: Emergency Medicine

## 2016-08-25 ENCOUNTER — Encounter (HOSPITAL_COMMUNITY): Payer: Self-pay | Admitting: *Deleted

## 2016-08-25 ENCOUNTER — Telehealth (HOSPITAL_COMMUNITY): Payer: Self-pay | Admitting: Emergency Medicine

## 2016-08-25 DIAGNOSIS — K6289 Other specified diseases of anus and rectum: Secondary | ICD-10-CM | POA: Diagnosis not present

## 2016-08-25 DIAGNOSIS — C8300 Small cell B-cell lymphoma, unspecified site: Secondary | ICD-10-CM

## 2016-08-25 DIAGNOSIS — Z79899 Other long term (current) drug therapy: Secondary | ICD-10-CM | POA: Insufficient documentation

## 2016-08-25 DIAGNOSIS — Z87891 Personal history of nicotine dependence: Secondary | ICD-10-CM | POA: Insufficient documentation

## 2016-08-25 LAB — CBC WITH DIFFERENTIAL/PLATELET
BASOS PCT: 0 %
Basophils Absolute: 0 10*3/uL (ref 0.0–0.1)
EOS ABS: 0 10*3/uL (ref 0.0–0.7)
EOS PCT: 2 %
HEMATOCRIT: 22.8 % — AB (ref 39.0–52.0)
HEMOGLOBIN: 7.7 g/dL — AB (ref 13.0–17.0)
LYMPHS ABS: 0.1 10*3/uL — AB (ref 0.7–4.0)
Lymphocytes Relative: 7 %
MCH: 31.8 pg (ref 26.0–34.0)
MCHC: 33.8 g/dL (ref 30.0–36.0)
MCV: 94.2 fL (ref 78.0–100.0)
MONO ABS: 0.4 10*3/uL (ref 0.1–1.0)
Monocytes Relative: 21 %
NEUTROS PCT: 70 %
Neutro Abs: 1.2 10*3/uL — ABNORMAL LOW (ref 1.7–7.7)
Platelets: 99 10*3/uL — ABNORMAL LOW (ref 150–400)
RBC: 2.42 MIL/uL — AB (ref 4.22–5.81)
RDW: 18.4 % — AB (ref 11.5–15.5)
WBC: 1.7 10*3/uL — AB (ref 4.0–10.5)

## 2016-08-25 LAB — BASIC METABOLIC PANEL
Anion gap: 3 — ABNORMAL LOW (ref 5–15)
BUN: 28 mg/dL — AB (ref 6–20)
CALCIUM: 8.3 mg/dL — AB (ref 8.9–10.3)
CHLORIDE: 103 mmol/L (ref 101–111)
CO2: 25 mmol/L (ref 22–32)
CREATININE: 1.18 mg/dL (ref 0.61–1.24)
GFR calc Af Amer: >60 mL/min (ref >60)
GFR calc non Af Amer: 55 mL/min — ABNORMAL LOW (ref >60)
Glucose, Bld: 105 mg/dL — ABNORMAL HIGH (ref 65–99)
Potassium: 4.1 mmol/L (ref 3.5–5.1)
Sodium: 131 mmol/L — ABNORMAL LOW (ref 135–145)

## 2016-08-25 LAB — SAMPLE TO BLOOD BANK

## 2016-08-25 LAB — POC OCCULT BLOOD, ED: Fecal Occult Bld: POSITIVE — AB

## 2016-08-25 MED ORDER — TRAMADOL HCL 50 MG PO TABS
50.0000 mg | ORAL_TABLET | Freq: Four times a day (QID) | ORAL | 0 refills | Status: DC | PRN
Start: 2016-08-25 — End: 2016-12-24

## 2016-08-25 MED ORDER — IOPAMIDOL (ISOVUE-300) INJECTION 61%
100.0000 mL | Freq: Once | INTRAVENOUS | Status: AC | PRN
  Administered 2016-08-25: 100 mL via INTRAVENOUS

## 2016-08-25 MED ORDER — SODIUM CHLORIDE 0.9 % IV BOLUS (SEPSIS)
1000.0000 mL | Freq: Once | INTRAVENOUS | Status: AC
  Administered 2016-08-25: 1000 mL via INTRAVENOUS

## 2016-08-25 MED ORDER — MORPHINE SULFATE (PF) 4 MG/ML IV SOLN
4.0000 mg | Freq: Once | INTRAVENOUS | Status: AC
  Administered 2016-08-25: 4 mg via INTRAVENOUS
  Filled 2016-08-25: qty 1

## 2016-08-25 MED ORDER — IOPAMIDOL (ISOVUE-300) INJECTION 61%
INTRAVENOUS | Status: AC
  Filled 2016-08-25: qty 30

## 2016-08-25 MED ORDER — ONDANSETRON HCL 4 MG/2ML IJ SOLN
4.0000 mg | Freq: Once | INTRAMUSCULAR | Status: AC
  Administered 2016-08-25: 4 mg via INTRAVENOUS
  Filled 2016-08-25: qty 2

## 2016-08-25 NOTE — Discharge Instructions (Signed)
Follow-up` with your family doctor or your surgeon next week if continued rectal pain

## 2016-08-25 NOTE — ED Notes (Signed)
Pt wanted iv vs port access.

## 2016-08-25 NOTE — ED Provider Notes (Signed)
[facility] DEPT Provider Note   CSN: [id number] Arrival date & time: 08/25/16  1108     History   Chief Complaint Chief Complaint  Patient presents with  . Rectal Pain    HPI Paul Keith is a 82 y.o. male.  Patient with lymphoma of uncertain type presents with rectal pain. He has recently received a blood transfusion for pancytopenia. Status post sigmoid diverticulitis this summer. No fever, sweats, chills. He reports bright red blood per rectum 3 days ago. He claims to be constipated. He just started chemotherapy treatment one week ago. He is pleasant and alert. Status post CT scan abd/pelvis on October 2 suggesting resolving sigmoid diverticulitis      Past Medical History:  Diagnosis Date  . Anemia 04/22/2016  . Diverticulosis    pt reports recent admission for diverticulosis perforation this past week  . 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 Pancytopenia ([place]) 04/22/2016    Patient Active Problem List   Diagnosis Date Noted  . At high risk for infection due to neutropenia ([place]) 07/23/2016  . Waldenstrom macroglobulinemia ([place]) 05/21/2016  . 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 IgM monoclonal gammopathy of uncertain significance 05/07/2016  . Lymphoproliferative disease ([place]) 05/07/2016  . Anemia 04/22/2016  . Pancytopenia ([facility] [facility]) 04/22/2016    Past Surgical History:  Procedure Laterality Date  . CATARACT EXTRACTION Bilateral   . TONSILLECTOMY         Home Medications    Prior to Admission medications   Medication Sig Start Date End Date Taking? Authorizing Provider  acetaminophen (TYLENOL) 500 MG tablet Take 500 mg by mouth every 6 (six) hours as needed for mild pain.   Yes Historical Provider, MD  acyclovir (ZOVIRAX) 400 MG tablet Take 1 tablet (400 mg total) by mouth daily. 08/10/16  Yes Patrici Ranks, MD  allopurinol (ZYLOPRIM) 300 MG tablet Take 1 tablet  (300 mg total) by mouth 2 (two) times daily. 07/09/16  Yes Manon Hilding Kefalas, PA-C  B Complex-C (B-COMPLEX WITH VITAMIN C) tablet Take 1 tablet by mouth daily.   Yes Historical Provider, MD  bendamustine in sodium chloride 0.9 % 50 mL Inject into the vein once. Every 28 days   Yes Historical Provider, MD  cholecalciferol (VITAMIN D) 1000 units tablet Take 1,000 Units by mouth daily.   Yes Historical Provider, MD  cyanocobalamin 100 MCG tablet Take by mouth daily. Pt reports unsure of dosage, takes once daily   Yes Historical Provider, MD  dexamethasone (DECADRON) 4 MG tablet Take 2 tablets (8 mg total) by mouth daily. Start the day after bendamustine chemotherapy for 2 days. Take with food. 08/10/16  Yes Patrici Ranks, MD  furosemide (LASIX) 20 MG tablet Take 20 mg by mouth daily.   Yes Historical Provider, MD  Multiple Vitamin (MULTIVITAMIN WITH MINERALS) TABS tablet Take 1 tablet by mouth daily.   Yes Historical Provider, MD  ondansetron (ZOFRAN) 8 MG tablet Take 1 tablet (8 mg total) by mouth 2 (two) times daily as needed for nausea or vomiting. Start on day 2 after bendamustine. 08/10/16  Yes Patrici Ranks, MD  prochlorperazine (COMPAZINE) 10 MG tablet Take 1 tablet (10 mg total) by mouth every 6 (six) hours as needed (Nausea or vomiting). 08/10/16  Yes Patrici Ranks, MD  RiTUXimab (RITUXAN IV) Inject into the vein.   Yes Historical Provider, MD  tamsulosin (FLOMAX) 0.4 MG CAPS capsule Take 0.8 mg by mouth daily after supper.  Yes Historical Provider, MD    Family History History reviewed. No pertinent family history.  Social History Social History  Substance Use Topics  . Smoking status: Former Smoker    Quit date: 09/15/1962  . Smokeless tobacco: Never Used  . Alcohol use Yes     Comment: OCCASSIONAL     Allergies   Review of patient's allergies indicates no known allergies.   Review of Systems Review of Systems  All other systems reviewed and are  negative.    Physical Exam Updated Vital Signs BP (!) 102/46   Pulse 72   Temp 98.6 F (37 C)   Resp (!) 8   Ht 6' (1.829 m)   Wt 162 lb (73.5 kg)   SpO2 96%   BMI 21.97 kg/m   Physical Exam  Constitutional: He is oriented to person, place, and time.  All, alert, pleasant  HENT:  Head: Normocephalic and atraumatic.  Eyes: Conjunctivae are normal.  Neck: Neck supple.  Cardiovascular: Normal rate and regular rhythm.   Pulmonary/Chest: Effort normal and breath sounds normal.  Abdominal: Soft. Bowel sounds are normal.  Genitourinary:  Genitourinary Comments: Rectal exam: No masses noted. Questionable internal hemorrhoids. Mucousy brown stool. Heme positive.  Musculoskeletal: Normal range of motion.  Neurological: He is alert and oriented to person, place, and time.  Skin: Skin is warm and dry.  Psychiatric: He has a normal mood and affect. His behavior is normal.  Nursing note and vitals reviewed.    ED Treatments / Results  Labs (all labs ordered are listed, but only abnormal results are displayed) Labs Reviewed  CBC WITH DIFFERENTIAL/PLATELET - Abnormal; Notable for the following:       Result Value   WBC 1.7 (*)    RBC 2.42 (*)    Hemoglobin 7.7 (*)    HCT 22.8 (*)    RDW 18.4 (*)    Platelets 99 (*)    Neutro Abs 1.2 (*)    Lymphs Abs 0.1 (*)    All other components within normal limits  BASIC METABOLIC PANEL - Abnormal; Notable for the following:    Sodium 131 (*)    Glucose, Bld 105 (*)    BUN 28 (*)    Calcium 8.3 (*)    GFR calc non Af Amer 55 (*)    Anion gap 3 (*)    All other components within normal limits  POC OCCULT BLOOD, ED - Abnormal; Notable for the following:    Fecal Occult Bld POSITIVE (*)    All other components within normal limits    EKG  EKG Interpretation None       Radiology Dg Abdomen Acute W/chest  Result Date: 08/25/2016 CLINICAL DATA:  Rectal pain.  Lymphoma. EXAM: DG ABDOMEN ACUTE W/ 1V CHEST COMPARISON:  CT  abdomen pelvis 08/16/2016 FINDINGS: Heart size and vascularity normal. Lungs are clear without infiltrate or effusion. No mass or adenopathy. Port-A-Cath tip in the SVC unchanged from 06/10/2016. Normal bowel gas pattern. No bowel obstruction or free air. Air-fluid levels in the right colon. No bowel edema. No abnormal calcifications. No acute skeletal abnormality. IMPRESSION: Negative abdominal radiographs.  No acute cardiopulmonary disease. Electronically Signed   By: Franchot Gallo M.D.   On: 08/25/2016 14:16    Procedures Procedures (including critical care time)  Medications Ordered in ED Medications  iopamidol (ISOVUE-300) 61 % injection (not administered)  morphine 4 MG/ML injection 4 mg (4 mg Intravenous Given 08/25/16 1322)  ondansetron (ZOFRAN) injection 4 mg (  4 mg Intravenous Given 08/25/16 1321)  sodium chloride 0.9 % bolus 1,000 mL (1,000 mLs Intravenous New Bag/Given 08/25/16 1353)     Initial Impression / Assessment and Plan / ED Course  I have reviewed the triage vital signs and the nursing notes.  Pertinent labs & imaging results that were available during my care of the patient were reviewed by me and considered in my medical decision making (see chart for details).  Clinical Course    Patient is pancytopenic. Hemoglobin has dropped from 8.7 to 7.7 in the past week. CT abd/pelvis pending.  Suspect exacerbation of sigmoid diverticulitis. Disc c Dr Roderic Palau.  Final Clinical Impressions(s) / ED Diagnoses   Final diagnoses:  Rectal pain    New Prescriptions New Prescriptions   No medications on file     Nat Christen, MD 08/25/16 1547

## 2016-08-25 NOTE — Telephone Encounter (Signed)
Paul Keith called and stated that Barneys rectum was still burning, and thinks he has tear.  They bought some hemorrhoid cream with no relief.  Spoke with Dr Whitney Muse, Told them to come to this ER.  Verbalized understanding.

## 2016-08-31 ENCOUNTER — Other Ambulatory Visit (HOSPITAL_COMMUNITY): Payer: Self-pay | Admitting: Emergency Medicine

## 2016-08-31 DIAGNOSIS — D649 Anemia, unspecified: Secondary | ICD-10-CM

## 2016-08-31 NOTE — Progress Notes (Signed)
[name] called and stated that Dionisios had a tempeture of 100.8 but was refusing to come in.  Spoke with Kirby Crigler PA take tylenol for fever.  Call if the triage line if that does not bring it down or come to the ER.  Pt is coming in for labs tomorrow but according to last hemoglobin it was 7.7, so set up for blood transfusion on Thursday for 2 units of PRBCs.  Orders placed.  Wife verbalized understanding.

## 2016-09-01 ENCOUNTER — Encounter (HOSPITAL_COMMUNITY): Payer: Medicare Other

## 2016-09-01 DIAGNOSIS — C8300 Small cell B-cell lymphoma, unspecified site: Secondary | ICD-10-CM

## 2016-09-01 DIAGNOSIS — D479 Neoplasm of uncertain behavior of lymphoid, hematopoietic and related tissue, unspecified: Secondary | ICD-10-CM

## 2016-09-01 DIAGNOSIS — D61818 Other pancytopenia: Secondary | ICD-10-CM | POA: Diagnosis not present

## 2016-09-01 DIAGNOSIS — D649 Anemia, unspecified: Secondary | ICD-10-CM

## 2016-09-01 LAB — CBC WITH DIFFERENTIAL/PLATELET
BASOS PCT: 0 %
Basophils Absolute: 0 10*3/uL (ref 0.0–0.1)
EOS ABS: 0 10*3/uL (ref 0.0–0.7)
EOS PCT: 1 %
HEMATOCRIT: 23.7 % — AB (ref 39.0–52.0)
HEMOGLOBIN: 7.8 g/dL — AB (ref 13.0–17.0)
LYMPHS PCT: 4 %
Lymphs Abs: 0.1 10*3/uL — ABNORMAL LOW (ref 0.7–4.0)
MCH: 31.3 pg (ref 26.0–34.0)
MCHC: 32.9 g/dL (ref 30.0–36.0)
MCV: 95.2 fL (ref 78.0–100.0)
MONOS PCT: 7 %
Monocytes Absolute: 0.2 10*3/uL (ref 0.1–1.0)
NEUTROS ABS: 2.9 10*3/uL (ref 1.7–7.7)
Neutrophils Relative %: 88 %
Platelets: 137 10*3/uL — ABNORMAL LOW (ref 150–400)
RBC: 2.49 MIL/uL — ABNORMAL LOW (ref 4.22–5.81)
RDW: 18.3 % — ABNORMAL HIGH (ref 11.5–15.5)
WBC: 3.2 10*3/uL — ABNORMAL LOW (ref 4.0–10.5)

## 2016-09-01 LAB — COMPREHENSIVE METABOLIC PANEL
ALK PHOS: 66 U/L (ref 38–126)
ALT: 19 U/L (ref 17–63)
AST: 18 U/L (ref 15–41)
Albumin: 2.8 g/dL — ABNORMAL LOW (ref 3.5–5.0)
Anion gap: 4 — ABNORMAL LOW (ref 5–15)
BILIRUBIN TOTAL: 0.6 mg/dL (ref 0.3–1.2)
BUN: 18 mg/dL (ref 6–20)
CALCIUM: 8.4 mg/dL — AB (ref 8.9–10.3)
CO2: 27 mmol/L (ref 22–32)
CREATININE: 1.24 mg/dL (ref 0.61–1.24)
Chloride: 99 mmol/L — ABNORMAL LOW (ref 101–111)
GFR, EST AFRICAN AMERICAN: 60 mL/min — AB (ref >60)
GFR, EST NON AFRICAN AMERICAN: 52 mL/min — AB (ref >60)
Glucose, Bld: 105 mg/dL — ABNORMAL HIGH (ref 65–99)
Potassium: 4.2 mmol/L (ref 3.5–5.1)
Sodium: 130 mmol/L — ABNORMAL LOW (ref 135–145)
Total Protein: 5.4 g/dL — ABNORMAL LOW (ref 6.5–8.1)

## 2016-09-01 LAB — PREPARE RBC (CROSSMATCH)

## 2016-09-01 NOTE — Progress Notes (Unsigned)
Spoke with blood bank to let them know that pt would be coming in tomorrow 09/02/2016 to have blood transfusion

## 2016-09-02 ENCOUNTER — Encounter (HOSPITAL_BASED_OUTPATIENT_CLINIC_OR_DEPARTMENT_OTHER): Payer: Medicare Other

## 2016-09-02 ENCOUNTER — Encounter (HOSPITAL_COMMUNITY): Payer: Self-pay

## 2016-09-02 ENCOUNTER — Other Ambulatory Visit (HOSPITAL_COMMUNITY): Payer: Self-pay | Admitting: Pharmacist

## 2016-09-02 DIAGNOSIS — D649 Anemia, unspecified: Secondary | ICD-10-CM

## 2016-09-02 DIAGNOSIS — D61818 Other pancytopenia: Secondary | ICD-10-CM | POA: Diagnosis not present

## 2016-09-02 MED ORDER — DIPHENHYDRAMINE HCL 25 MG PO CAPS
ORAL_CAPSULE | ORAL | Status: AC
  Filled 2016-09-02: qty 1

## 2016-09-02 MED ORDER — DIPHENHYDRAMINE HCL 25 MG PO CAPS
25.0000 mg | ORAL_CAPSULE | Freq: Once | ORAL | Status: AC
  Administered 2016-09-02: 25 mg via ORAL

## 2016-09-02 MED ORDER — SODIUM CHLORIDE 0.9 % IV SOLN
250.0000 mL | Freq: Once | INTRAVENOUS | Status: AC
  Administered 2016-09-02: 250 mL via INTRAVENOUS

## 2016-09-02 MED ORDER — ACETAMINOPHEN 325 MG PO TABS
ORAL_TABLET | ORAL | Status: AC
  Filled 2016-09-02: qty 2

## 2016-09-02 MED ORDER — SODIUM CHLORIDE 0.9% FLUSH
10.0000 mL | INTRAVENOUS | Status: AC | PRN
  Administered 2016-09-02: 10 mL

## 2016-09-02 MED ORDER — ACETAMINOPHEN 325 MG PO TABS
650.0000 mg | ORAL_TABLET | Freq: Once | ORAL | Status: AC
  Administered 2016-09-02: 650 mg via ORAL

## 2016-09-02 MED ORDER — HEPARIN SOD (PORK) LOCK FLUSH 100 UNIT/ML IV SOLN
500.0000 [IU] | Freq: Every day | INTRAVENOUS | Status: AC | PRN
  Administered 2016-09-02: 500 [IU]

## 2016-09-02 NOTE — Progress Notes (Signed)
Tolerated transfusions w/o adverse reaction.  Alert, in no distress.  VSS.  Discharged ambulatory.

## 2016-09-02 NOTE — Patient Instructions (Signed)
[place] at [facility] Discharge Instructions  RECOMMENDATIONS MADE BY THE CONSULTANT AND ANY TEST RESULTS WILL BE SENT TO YOUR REFERRING PHYSICIAN.  Today you received 2 units of blood. Return as scheduled for lab work. Return as scheduled for office visit and chemotherapy.   Thank you for choosing [place] at [facility] to provide your oncology and hematology care.  To afford each patient quality time with our provider, please arrive at least 15 minutes before your scheduled appointment time.   Beginning January 23rd 2017 lab work for the [name] will be done in the  Main lab at [facility] on 1st floor. If you have a lab appointment with the [facility] please come in thru the  Main Entrance and check in at the main information desk  You need to re-schedule your appointment should you arrive 10 or more minutes late.  We strive to give you quality time with our providers, and arriving late affects you and other patients whose appointments are after yours.  Also, if you no show three or more times for appointments you may be dismissed from the clinic at the providers discretion.     Again, thank you for choosing [facility].  Our hope is that these requests will decrease the amount of time that you wait before being seen by our physicians.       _____________________________________________________________  Should you have questions after your visit to [facility], please contact our office at [phone] between the hours of 8:30 a.m. and 4:30 p.m.  Voicemails left after 4:30 p.m. will not be returned until the following business day.  For prescription refill requests, have your pharmacy contact our office.         Resources For Cancer Patients and their Caregivers ? American Cancer Society: Can assist with transportation, wigs, general needs, runs Look Good Feel Better.         (507)886-5602 ? Cancer Care: Provides financial assistance, online support groups, medication/co-pay assistance.  1-800-813-HOPE 937-305-5771) ? [place] Assists [place] cancer patients and their families through emotional , educational and financial support.  5481814912 ? [place] DSS Where to apply for food stamps, Medicaid and utility assistance. 416-113-5845 ? RCATS: Transportation to medical appointments. 806-768-4535 ? Social Security Administration: May apply for disability if have a Stage IV cancer. 660-442-3984 4098356649 ? [employer], Disability and Transit Services: Assists with nutrition, care and transit needs. [place] Support Programs: @10RELATIVEDAYS @ > Cancer Support Group  2nd Tuesday of the month 1pm-2pm, Journey Room  > Creative Journey  3rd Tuesday of the month 1130am-1pm, Journey Room  > Look Good Feel Better  1st Wednesday of the month 10am-12 noon, Journey Room (Call [place] to register [phone])

## 2016-09-03 LAB — TYPE AND SCREEN
ABO/RH(D): O POS
Antibody Screen: NEGATIVE
Unit division: 0
Unit division: 0

## 2016-09-08 ENCOUNTER — Encounter (HOSPITAL_COMMUNITY): Payer: Medicare Other

## 2016-09-08 DIAGNOSIS — C8300 Small cell B-cell lymphoma, unspecified site: Secondary | ICD-10-CM

## 2016-09-08 DIAGNOSIS — D61818 Other pancytopenia: Secondary | ICD-10-CM

## 2016-09-08 DIAGNOSIS — D479 Neoplasm of uncertain behavior of lymphoid, hematopoietic and related tissue, unspecified: Secondary | ICD-10-CM

## 2016-09-08 LAB — CBC WITH DIFFERENTIAL/PLATELET
BASOS PCT: 0 %
Basophils Absolute: 0 10*3/uL (ref 0.0–0.1)
EOS ABS: 0.1 10*3/uL (ref 0.0–0.7)
Eosinophils Relative: 9 %
HEMATOCRIT: 29.3 % — AB (ref 39.0–52.0)
HEMOGLOBIN: 9.7 g/dL — AB (ref 13.0–17.0)
Lymphocytes Relative: 14 %
Lymphs Abs: 0.2 10*3/uL — ABNORMAL LOW (ref 0.7–4.0)
MCH: 30.9 pg (ref 26.0–34.0)
MCHC: 33.1 g/dL (ref 30.0–36.0)
MCV: 93.3 fL (ref 78.0–100.0)
Monocytes Absolute: 0.3 10*3/uL (ref 0.1–1.0)
Monocytes Relative: 19 %
NEUTROS ABS: 0.9 10*3/uL — AB (ref 1.7–7.7)
NEUTROS PCT: 59 %
Platelets: 131 10*3/uL — ABNORMAL LOW (ref 150–400)
RBC: 3.14 MIL/uL — AB (ref 4.22–5.81)
RDW: 17.6 % — ABNORMAL HIGH (ref 11.5–15.5)
WBC: 1.5 10*3/uL — AB (ref 4.0–10.5)

## 2016-09-08 LAB — COMPREHENSIVE METABOLIC PANEL
ALBUMIN: 3.1 g/dL — AB (ref 3.5–5.0)
ALK PHOS: 58 U/L (ref 38–126)
ALT: 16 U/L — AB (ref 17–63)
AST: 14 U/L — AB (ref 15–41)
Anion gap: 5 (ref 5–15)
BILIRUBIN TOTAL: 0.6 mg/dL (ref 0.3–1.2)
BUN: 19 mg/dL (ref 6–20)
CALCIUM: 8.8 mg/dL — AB (ref 8.9–10.3)
CO2: 26 mmol/L (ref 22–32)
CREATININE: 1.15 mg/dL (ref 0.61–1.24)
Chloride: 101 mmol/L (ref 101–111)
GFR calc Af Amer: >60 mL/min (ref >60)
GFR calc non Af Amer: 57 mL/min — ABNORMAL LOW (ref >60)
GLUCOSE: 95 mg/dL (ref 65–99)
Potassium: 4.1 mmol/L (ref 3.5–5.1)
SODIUM: 132 mmol/L — AB (ref 135–145)
Total Protein: 5.7 g/dL — ABNORMAL LOW (ref 6.5–8.1)

## 2016-09-08 LAB — SAMPLE TO BLOOD BANK

## 2016-09-14 ENCOUNTER — Encounter (HOSPITAL_BASED_OUTPATIENT_CLINIC_OR_DEPARTMENT_OTHER): Payer: Medicare Other

## 2016-09-14 ENCOUNTER — Encounter (HOSPITAL_BASED_OUTPATIENT_CLINIC_OR_DEPARTMENT_OTHER): Payer: Medicare Other | Admitting: Hematology & Oncology

## 2016-09-14 ENCOUNTER — Encounter (HOSPITAL_COMMUNITY): Payer: Self-pay | Admitting: Hematology & Oncology

## 2016-09-14 VITALS — BP 135/63 | HR 77 | Temp 97.5°F | Resp 18 | Wt 153.4 lb

## 2016-09-14 DIAGNOSIS — D701 Agranulocytosis secondary to cancer chemotherapy: Secondary | ICD-10-CM

## 2016-09-14 DIAGNOSIS — D708 Other neutropenia: Secondary | ICD-10-CM

## 2016-09-14 DIAGNOSIS — D472 Monoclonal gammopathy: Secondary | ICD-10-CM

## 2016-09-14 DIAGNOSIS — C8300 Small cell B-cell lymphoma, unspecified site: Secondary | ICD-10-CM

## 2016-09-14 DIAGNOSIS — G4700 Insomnia, unspecified: Secondary | ICD-10-CM

## 2016-09-14 DIAGNOSIS — T451X5A Adverse effect of antineoplastic and immunosuppressive drugs, initial encounter: Secondary | ICD-10-CM

## 2016-09-14 DIAGNOSIS — C8800 Waldenstrom macroglobulinemia not having achieved remission: Secondary | ICD-10-CM

## 2016-09-14 DIAGNOSIS — C8597 Non-Hodgkin lymphoma, unspecified, spleen: Secondary | ICD-10-CM

## 2016-09-14 DIAGNOSIS — D61818 Other pancytopenia: Secondary | ICD-10-CM | POA: Diagnosis not present

## 2016-09-14 DIAGNOSIS — R350 Frequency of micturition: Secondary | ICD-10-CM | POA: Insufficient documentation

## 2016-09-14 DIAGNOSIS — R5383 Other fatigue: Secondary | ICD-10-CM

## 2016-09-14 DIAGNOSIS — R351 Nocturia: Secondary | ICD-10-CM

## 2016-09-14 DIAGNOSIS — Z95828 Presence of other vascular implants and grafts: Secondary | ICD-10-CM

## 2016-09-14 LAB — CBC WITH DIFFERENTIAL/PLATELET
BASOS ABS: 0 10*3/uL (ref 0.0–0.1)
BASOS PCT: 1 %
EOS ABS: 0.1 10*3/uL (ref 0.0–0.7)
Eosinophils Relative: 14 %
HCT: 29.3 % — ABNORMAL LOW (ref 39.0–52.0)
Hemoglobin: 9.7 g/dL — ABNORMAL LOW (ref 13.0–17.0)
LYMPHS ABS: 0.2 10*3/uL — AB (ref 0.7–4.0)
Lymphocytes Relative: 34 %
MCH: 31.1 pg (ref 26.0–34.0)
MCHC: 33.1 g/dL (ref 30.0–36.0)
MCV: 93.9 fL (ref 78.0–100.0)
MONO ABS: 0.2 10*3/uL (ref 0.1–1.0)
Monocytes Relative: 29 %
NEUTROS ABS: 0.1 10*3/uL — AB (ref 1.7–7.7)
Neutrophils Relative %: 22 %
Platelets: 136 10*3/uL — ABNORMAL LOW (ref 150–400)
RBC: 3.12 MIL/uL — ABNORMAL LOW (ref 4.22–5.81)
RDW: 17.5 % — AB (ref 11.5–15.5)
WBC: 0.6 10*3/uL — CL (ref 4.0–10.5)

## 2016-09-14 LAB — COMPREHENSIVE METABOLIC PANEL
ALBUMIN: 3.3 g/dL — AB (ref 3.5–5.0)
ALK PHOS: 64 U/L (ref 38–126)
ALT: 14 U/L — ABNORMAL LOW (ref 17–63)
ANION GAP: 5 (ref 5–15)
AST: 18 U/L (ref 15–41)
BILIRUBIN TOTAL: 0.6 mg/dL (ref 0.3–1.2)
BUN: 18 mg/dL (ref 6–20)
CALCIUM: 8.7 mg/dL — AB (ref 8.9–10.3)
CO2: 25 mmol/L (ref 22–32)
Chloride: 103 mmol/L (ref 101–111)
Creatinine, Ser: 1.15 mg/dL (ref 0.61–1.24)
GFR calc non Af Amer: 57 mL/min — ABNORMAL LOW (ref >60)
GLUCOSE: 95 mg/dL (ref 65–99)
POTASSIUM: 4.1 mmol/L (ref 3.5–5.1)
SODIUM: 133 mmol/L — AB (ref 135–145)
TOTAL PROTEIN: 5.6 g/dL — AB (ref 6.5–8.1)

## 2016-09-14 LAB — URINALYSIS, ROUTINE W REFLEX MICROSCOPIC
BILIRUBIN URINE: NEGATIVE
GLUCOSE, UA: NEGATIVE mg/dL
Hgb urine dipstick: NEGATIVE
KETONES UR: NEGATIVE mg/dL
LEUKOCYTES UA: NEGATIVE
Nitrite: NEGATIVE
PH: 5.5 (ref 5.0–8.0)
PROTEIN: NEGATIVE mg/dL
Specific Gravity, Urine: 1.025 (ref 1.005–1.030)

## 2016-09-14 MED ORDER — HEPARIN SOD (PORK) LOCK FLUSH 100 UNIT/ML IV SOLN
500.0000 [IU] | Freq: Once | INTRAVENOUS | Status: AC
  Administered 2016-09-14: 500 [IU] via INTRAVENOUS

## 2016-09-14 MED ORDER — HEPARIN SOD (PORK) LOCK FLUSH 100 UNIT/ML IV SOLN
INTRAVENOUS | Status: AC
  Filled 2016-09-14: qty 5

## 2016-09-14 MED ORDER — PEGFILGRASTIM INJECTION 6 MG/0.6ML ~~LOC~~
6.0000 mg | PREFILLED_SYRINGE | Freq: Once | SUBCUTANEOUS | Status: AC
  Administered 2016-09-14: 6 mg via SUBCUTANEOUS

## 2016-09-14 MED ORDER — PEGFILGRASTIM INJECTION 6 MG/0.6ML ~~LOC~~
PREFILLED_SYRINGE | SUBCUTANEOUS | Status: AC
  Filled 2016-09-14: qty 0.6

## 2016-09-14 NOTE — Patient Instructions (Signed)
[place] at [facility] Discharge Instructions  RECOMMENDATIONS MADE BY THE CONSULTANT AND ANY TEST RESULTS WILL BE SENT TO YOUR REFERRING PHYSICIAN.  No treatment today. Follow up as scheduled.  Thank you for choosing [place] at [facility] to provide your oncology and hematology care.  To afford each patient quality time with our provider, please arrive at least 15 minutes before your scheduled appointment time.   Beginning January 23rd 2017 lab work for the [name] will be done in the  Main lab at [facility] on 1st floor. If you have a lab appointment with the [facility] please come in thru the  Main Entrance and check in at the main information desk  You need to re-schedule your appointment should you arrive 10 or more minutes late.  We strive to give you quality time with our providers, and arriving late affects you and other patients whose appointments are after yours.  Also, if you no show three or more times for appointments you may be dismissed from the clinic at the providers discretion.     Again, thank you for choosing [facility].  Our hope is that these requests will decrease the amount of time that you wait before being seen by our physicians.       _____________________________________________________________  Should you have questions after your visit to [facility], please contact our office at [phone] between the hours of 8:30 a.m. and 4:30 p.m.  Voicemails left after 4:30 p.m. will not be returned until the following business day.  For prescription refill requests, have your pharmacy contact our office.         Resources For Cancer Patients and their Caregivers ? American Cancer Society: Can assist with transportation, wigs, general needs, runs Look Good Feel Better.        (639)811-8609 ? Cancer Care: Provides financial assistance, online support groups, medication/co-pay  assistance.  1-800-813-HOPE (986)841-4041) ? [place] Assists [place] cancer patients and their families through emotional , educational and financial support.  249-404-3960 ? [place] DSS Where to apply for food stamps, Medicaid and utility assistance. 224-193-5810 ? RCATS: Transportation to medical appointments. (270)787-8219 ? Social Security Administration: May apply for disability if have a Stage IV cancer. 505-133-8956 434-700-7738 ? [employer], Disability and Transit Services: Assists with nutrition, care and transit needs. [place] Support Programs: @10RELATIVEDAYS @ > Cancer Support Group  2nd Tuesday of the month 1pm-2pm, Journey Room  > Creative Journey  3rd Tuesday of the month 1130am-1pm, Journey Room  > Look Good Feel Better  1st Wednesday of the month 10am-12 noon, Journey Room (Call [place] to register [phone])

## 2016-09-14 NOTE — Progress Notes (Signed)
HEMATOLOGY/ONCOLOGY PROGRESS NOTE  Date of Service: 09/14/2016  Patient Care Team: Octavio Graves, DO as PCP - General  CHIEF COMPLAINTS:  NHL Pancytopenia BMBX on 05/06/2016 with extensive involvement by NHL B cell, admixed plasma cell component to a lesser extent which also shows kappa light chain restriction. DDX lymphoplasmacytic lymphoma, marginal zone lymphoma, follicular lymphoma with plasma cell differentiation, splenic lymphoma. Low grade process is favored.  HISTORY OF PRESENTING ILLNESS:  Paul Keith is a wonderful 82 y.o. male who  Is here for a follow-up of NHL vs lymphoplasmacytic lymphoma.   Patient is feeling very fatigued. He will be attending a wedding soon. He notes that he has good days and bad days. Appetite is WNL. No further abdominal pain. No diarrhea or constipation. No fever or chills.   He also experiences difficulty sleeping and nocturia. He denies hematuria or burning.  Mood is ok. He continues to be positive.  MEDICAL HISTORY:  Past Medical History:  Diagnosis Date  . Anemia 04/22/2016  . Diverticulosis    pt reports recent admission for diverticulosis perforation this past week  . 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 Pancytopenia ([place]) 04/22/2016  BPH L4-L5 fracture 6 weeks ago Vasomotor rhinitis for several years.  SURGICAL HISTORY: Past Surgical History:  Procedure Laterality Date  . CATARACT EXTRACTION Bilateral   . TONSILLECTOMY      SOCIAL HISTORY: Social History   Social History  . Marital status: Married    Spouse name: N/A  . Number of children: N/A  . Years of education: N/A   Occupational History  . Not on file.   Social History Main Topics  . Smoking status: Former Smoker    Quit date: 09/15/1962  . Smokeless tobacco: Never Used  . Alcohol use Yes     Comment: OCCASSIONAL  . Drug use: No  . Sexual activity: Not on file   Other Topics Concern    . Not on file   Social History Narrative  . No narrative on file    FAMILY HISTORY: History reviewed. No pertinent family history.  ALLERGIES:  has No Known Allergies.  MEDICATIONS:  Current Outpatient Prescriptions  Medication Sig Dispense Refill  . acetaminophen (TYLENOL) 500 MG tablet Take 500 mg by mouth every 6 (six) hours as needed for mild pain.    [name] Kitchen acyclovir (ZOVIRAX) 400 MG tablet Take 1 tablet (400 mg total) by mouth daily. 30 tablet 3  . allopurinol (ZYLOPRIM) 300 MG tablet Take 1 tablet (300 mg total) by mouth 2 (two) times daily. 60 tablet 1  . B Complex-C (B-COMPLEX WITH VITAMIN C) tablet Take 1 tablet by mouth daily.    . bendamustine in sodium chloride 0.9 % 50 mL Inject into the vein once. Every 28 days    . cholecalciferol (VITAMIN D) 1000 units tablet Take 1,000 Units by mouth daily.    . cyanocobalamin 100 MCG tablet Take by mouth daily. Pt reports unsure of dosage, takes once daily    . dexamethasone (DECADRON) 4 MG tablet Take 2 tablets (8 mg total) by mouth daily. Start the day after bendamustine chemotherapy for 2 days. Take with food. 30 tablet 1  . furosemide (LASIX) 20 MG tablet Take 20 mg by mouth daily.    . Multiple Vitamin (MULTIVITAMIN WITH MINERALS) TABS tablet Take 1 tablet by mouth daily.    . ondansetron (ZOFRAN) 8 MG tablet Take 1 tablet (8 mg total) by mouth 2 (two) times  daily as needed for nausea or vomiting. Start on day 2 after bendamustine. 30 tablet 1  . prochlorperazine (COMPAZINE) 10 MG tablet Take 1 tablet (10 mg total) by mouth every 6 (six) hours as needed (Nausea or vomiting). 30 tablet 1  . RiTUXimab (RITUXAN IV) Inject into the vein.    . tamsulosin (FLOMAX) 0.4 MG CAPS capsule Take 0.8 mg by mouth daily after supper.     . traMADol (ULTRAM) 50 MG tablet Take 1 tablet (50 mg total) by mouth every 6 (six) hours as needed. 20 tablet 0   No current facility-administered medications for this visit.     REVIEW OF SYSTEMS:   Review of  Systems  Constitutional: Positive for malaise/fatigue.  HENT: Negative.   Eyes: Negative.   Respiratory: Negative.   Cardiovascular: Negative.   Gastrointestinal: Negative.   Genitourinary: Negative.   Musculoskeletal: Negative.   Skin: Negative.   Neurological: Negative.   Endo/Heme/Allergies: Negative.   Psychiatric/Behavioral: The patient has insomnia.   All other systems reviewed and are negative. 14 point review of systems was performed and is negative except as detailed under history of present illness and above   PHYSICAL EXAMINATION: ECOG PERFORMANCE STATUS: 1 - Symptomatic but completely ambulatory  Vitals with BMI 09/14/2016  Height   Weight 153 lbs 6 oz  BMI   Systolic 268  Diastolic 63  Pulse 77  Respirations 18    Physical Exam  Constitutional: He is oriented to person, place, and time and well-developed, well-nourished, and in no distress.  HENT:  Head: Normocephalic and atraumatic.  Nose: Nose normal.  Mouth/Throat: Oropharynx is clear and moist. No oropharyngeal exudate.  Eyes: Conjunctivae and EOM are normal. Pupils are equal, round, and reactive to light. Right eye exhibits no discharge. Left eye exhibits no discharge. No scleral icterus.  Neck: Normal range of motion. Neck supple. No tracheal deviation present. No thyromegaly present.  Cardiovascular: Normal rate, regular rhythm and normal heart sounds.  Exam reveals no gallop and no friction rub.   No murmur heard. Pulmonary/Chest: Effort normal and breath sounds normal. He has no wheezes. He has no rales.  Abdominal: Soft. Bowel sounds are normal. He exhibits no distension and no mass. There is no tenderness. There is no rebound and no guarding.  Musculoskeletal: Normal range of motion. He exhibits no edema.  Lymphadenopathy:    He has no cervical adenopathy.  Neurological: He is alert and oriented to person, place, and time. He has normal reflexes. No cranial nerve deficit. Gait normal. Coordination  normal.  Skin: Skin is warm and dry. No rash noted.  Psychiatric: Mood, memory, affect and judgment normal.  Nursing note and vitals reviewed.   LABORATORY DATA:  I have reviewed the data as listed  Results for ISMAIL, GRAZIANI (MRN [id number]) as of 09/14/2016 11:52  Ref. Range 09/14/2016 09:25  Sodium Latest Ref Range: 135 - 145 mmol/L 133 (L)  Potassium Latest Ref Range: 3.5 - 5.1 mmol/L 4.1  Chloride Latest Ref Range: 101 - 111 mmol/L 103  CO2 Latest Ref Range: 22 - 32 mmol/L 25  BUN Latest Ref Range: 6 - 20 mg/dL 18  Creatinine Latest Ref Range: 0.61 - 1.24 mg/dL 1.15  Calcium Latest Ref Range: 8.9 - 10.3 mg/dL 8.7 (L)  EGFR (Non-African Amer.) Latest Ref Range: >60 mL/min 57 (L)  EGFR (African American) Latest Ref Range: >60 mL/min >60  Glucose Latest Ref Range: 65 - 99 mg/dL 95  Anion gap Latest Ref Range: 5 - 15  5  Alkaline Phosphatase Latest Ref Range: 38 - 126 U/L 64  Albumin Latest Ref Range: 3.5 - 5.0 g/dL 3.3 (L)  AST Latest Ref Range: 15 - 41 U/L 18  ALT Latest Ref Range: 17 - 63 U/L 14 (L)  Total Protein Latest Ref Range: 6.5 - 8.1 g/dL 5.6 (L)  Total Bilirubin Latest Ref Range: 0.3 - 1.2 mg/dL 0.6  WBC Latest Ref Range: 4.0 - 10.5 K/uL 0.6 (LL)  RBC Latest Ref Range: 4.22 - 5.81 MIL/uL 3.12 (L)  Hemoglobin Latest Ref Range: 13.0 - 17.0 g/dL 9.7 (L)  HCT Latest Ref Range: 39.0 - 52.0 % 29.3 (L)  MCV Latest Ref Range: 78.0 - 100.0 fL 93.9  MCH Latest Ref Range: 26.0 - 34.0 pg 31.1  MCHC Latest Ref Range: 30.0 - 36.0 g/dL 33.1  RDW Latest Ref Range: 11.5 - 15.5 % 17.5 (H)  Platelets Latest Ref Range: 150 - 400 K/uL 136 (L)  Neutrophils Latest Units: % 22  Lymphocytes Latest Units: % 34  Monocytes Relative Latest Units: % 29  Eosinophil Latest Units: % 14  Basophil Latest Units: % 1  NEUT# Latest Ref Range: 1.7 - 7.7 K/uL 0.1 (L)  Lymphocyte # Latest Ref Range: 0.7 - 4.0 K/uL 0.2 (L)  Monocyte # Latest Ref Range: 0.1 - 1.0 K/uL 0.2  Eosinophils Absolute Latest  Ref Range: 0.0 - 0.7 K/uL 0.1  Basophils Absolute Latest Ref Range: 0.0 - 0.1 K/uL 0.0  WBC Morphology Unknown WHITE COUNT CONFI...    RADIOGRAPHIC STUDIES: I have personally reviewed the radiological images as listed and agreed with the findings in the report. Ct Abdomen Pelvis W Contrast  Result Date: 08/25/2016 CLINICAL DATA:  Bright red blood per rectum with rectal pain for 3 days. History of diverticulitis. History of lymphoma. EXAM: CT ABDOMEN AND PELVIS WITH CONTRAST TECHNIQUE: Multidetector CT imaging of the abdomen and pelvis was performed using the standard protocol following bolus administration of intravenous contrast. CONTRAST:  132m ISOVUE-300 IOPAMIDOL (ISOVUE-300) INJECTION 61% COMPARISON:  08/16/2016 FINDINGS: Lower chest: No acute abnormality. Hepatobiliary: No focal liver abnormality is seen. No gallstones, gallbladder wall thickening, or biliary dilatation. Pancreas: Unremarkable. No pancreatic ductal dilatation or surrounding inflammatory changes. Spleen: Enlarged measuring 17.9 x 7.2 x 15.6 cm, similar to the prior study. No splenic mass or focal lesion. Adrenals/Urinary Tract: Adrenal glands are unremarkable. Kidneys are normal, without renal calculi, focal lesion, or hydronephrosis. Bladder is unremarkable. Stomach/Bowel: Stomach and small bowel unremarkable. There are sigmoid colon diverticula. Area of previously seen diverticulitis has essentially resolved. There is no current evidence of active diverticulitis. No colonic wall thickening or inflammation. Normal appendix visualized. Vascular/Lymphatic: There is a focal irregular water attenuation lesion along the left margin at the root of the small bowel mesentery, measuring 6.1 x 3.6 x 4.9 cm. There is a similar-appearing low-attenuation area that tracks along the right external and internal iliac vessels inferiorly and posteriorly to the lying anterior to the lower sigmoid colon and upper rectum. These focal areas of fluid are  similar to the prior CT. There are no discrete enlarged lymph nodes. Mild atherosclerotic calcifications are noted along a normal caliber abdominal aorta. Reproductive: Prostate is mildly enlarged stable. Other: No abdominal wall hernia. Musculoskeletal: Compression fractures of L4 and L5 stable from the prior CT. No other fractures. No osteoblastic or osteolytic lesions. IMPRESSION: 1. Sigmoid colon diverticulitis seen on the prior exams has essentially resolved. No new areas of colonic inflammation. 2. Small amount nonspecific fluid tracks along  the right iliac vessels into the posterior pelvis, stable from the prior exam. There is another small fluid collection in the central small bowel mesentery, also stable. 3. No acute findings in the abdomen or pelvis. 4. Stable splenomegaly. Electronically Signed   By: Lajean Manes M.D.   On: 08/25/2016 16:48   Ct Abdomen Pelvis W Contrast  Result Date: 08/17/2016 CLINICAL DATA:  Recent diverticulitis, history of lymphoma EXAM: CT ABDOMEN AND PELVIS WITH CONTRAST TECHNIQUE: Multidetector CT imaging of the abdomen and pelvis was performed using the standard protocol following bolus administration of intravenous contrast. CONTRAST:  175m ISOVUE-300 IOPAMIDOL (ISOVUE-300) INJECTION 61% COMPARISON:  06/07/2016, 05/16/2016 FINDINGS: Lower chest:  No acute abnormality. Hepatobiliary: No focal hepatic abnormality or biliary dilatation. Gallbladder and biliary system unremarkable. Pancreas: Unremarkable. No pancreatic ductal dilatation or surrounding inflammatory changes. Spleen: Spleen remains enlarged measuring 15 cm in craniocaudal length, previously 17 cm. Stable enlargement of the splenic vein and portal vein which may be secondary to the chronic splenomegaly versus a component of portal hypertension. No significant varices demonstrated Adrenals/Urinary Tract: Normal adrenal glands. Kidneys demonstrate symmetric normal enhancement and excretion. No renal obstruction or  hydronephrosis. No obstructing ureteral calculus. Stable mild distention of the right distal ureter in the pelvis compared to several prior exams, image 70. This remains nonspecific. Bladder is underdistended paired grossly unremarkable. Stomach/Bowel: Negative bowel obstruction, significant dilatation, ileus, or free air. Colonic diverticulosis noted again, most pronounced in the sigmoid region. Previous sigmoid diverticulitis shows improvement in the left hemipelvis. Small amount of residual entrapped abdominal ascites within the central mesentery and dependent pelvic free fluid remains nonspecific. Single focus of entrapped air remains within the pericolonic fat, image 77 compatible with sequelae from the prior perforation. Vascular/Lymphatic: Stable atherosclerosis of the abdominal aorta without aneurysm or dissection. Negative for adenopathy. Reproductive: Stable prostate enlargement. Other: No inguinal abnormality or hernia.  Intact abdominal wall. Musculoskeletal: Stable chronic L4 and L5 compression fractures. Stable chronic multilevel facet arthropathy of the lumbar spine. No acute osseous finding. IMPRESSION: Resolving/improved sigmoid diverticulitis compared to 05/16/2016 and 06/07/2016. Stable small amount of abdominal pelvic ascites. Small residual entrapped focus of extraluminal air within the pelvis at the previous perforation compatible with sequelae from prior perforated diverticulitis. No current abdominal or pelvic abscess. Stable splenomegaly Abdominal atherosclerosis Electronically Signed   By: MJerilynn Mages  Shick M.D.   On: 08/17/2016 09:15   Dg Abdomen Acute W/chest  Result Date: 08/25/2016 CLINICAL DATA:  Rectal pain.  Lymphoma. EXAM: DG ABDOMEN ACUTE W/ 1V CHEST COMPARISON:  CT abdomen pelvis 08/16/2016 FINDINGS: Heart size and vascularity normal. Lungs are clear without infiltrate or effusion. No mass or adenopathy. Port-A-Cath tip in the SVC unchanged from 06/10/2016. Normal bowel gas pattern.  No bowel obstruction or free air. Air-fluid levels in the right colon. No bowel edema. No abnormal calcifications. No acute skeletal abnormality. IMPRESSION: Negative abdominal radiographs.  No acute cardiopulmonary disease. Electronically Signed   By: CFranchot GalloM.D.   On: 08/25/2016 14:16      ASSESSMENT & PLAN:  NHL, low grade, heavy marrow involvement Pancytopenia with neutropenia IgM Kappa monoclonal gammopathy L4-L5 vertebral fracture Splenomegaly secondary to NHL Diffuse osseous uptake on PET secondary to NHL Diverticulitis/diverticular abscess  82year old Caucasian male with good performance status with minimal chronic medical comorbidities with  #1 Pancytopenia with marrow showing lymphocytic infiltration concerning for lymphoproliferative process. LPL and MCL in the top differentials. He received multiple cycles of Rituxan but with little improvement in counts. Bendamustine has been added  with good tolerance. I am hopeful counts should improve over the next several months. If he continues to need blood we may consider adding aranesp. He will continue to require neulasta.   #2 IgM Kappa monoclonal gammopathy with preliminary bone marrow picture showing lymphocytic infiltration concerning for Waldenstrom's Macroglobulinemia/lymphoplasmacytic lymphoma.As treatment progresses will continue to monitor.   Neutropenia and fever were again reviewed with the patient and his family.   Patient is experiencing fatigue. His hemoglobin and platelets are normal. However, his ttotal WBC is 600. I will give him Neulasta today, neutropenia is multifactorial from heavy marrow involvement by his NHL but now also therapy.  I will delay treatment two weeks. I strongly anticipate that over the next month or so counts will begin to improve.   Patient will give a urine sample today. He will be notified of results once available.   Follow up with patient in two weeks.   All of the patients and his  family's questions were answered in details to their apparent satisfaction. The patient knows to call the clinic with any problems, questions or concerns.   Orders Placed This Encounter  Procedures  . Urine culture    Standing Status:   Future    Number of Occurrences:   1    Standing Expiration Date:   09/14/2017  . Urinalysis, Routine w reflex microscopic    Standing Status:   Future    Number of Occurrences:   1    Standing Expiration Date:   09/14/2017   This document serves as a record of services personally performed by Ancil Linsey, MD. It was created on her behalf by Elmyra Ricks, a trained medical scribe. The creation of this record is based on the scribe's personal observations and the provider's statements to them. This document has been checked and approved by the attending provider.  I have reviewed the above documentation for accuracy and completeness, and I agree with the above.  Kelby Fam. Ezriel Boffa, MD  09/14/2016 8:19 AM

## 2016-09-14 NOTE — Patient Instructions (Addendum)
[place] at [facility] Discharge Instructions  RECOMMENDATIONS MADE BY THE CONSULTANT AND ANY TEST RESULTS WILL BE SENT TO YOUR REFERRING PHYSICIAN.  You saw Dr.Penland today.  Delaying treatment today.   Urinalysis and culture today.   Neulasta today.   Return for treatment in 2 weeks with labs.  See Amy at checkout for appointments.  Thank you for choosing [place] at [facility] to provide your oncology and hematology care.  To afford each patient quality time with our provider, please arrive at least 15 minutes before your scheduled appointment time.   Beginning January 23rd 2017 lab work for the [name] will be done in the  Main lab at [facility] on 1st floor. If you have a lab appointment with the [place] please come in thru the  Main Entrance and check in at the main information desk  You need to re-schedule your appointment should you arrive 10 or more minutes late.  We strive to give you quality time with our providers, and arriving late affects you and other patients whose appointments are after yours.  Also, if you no show three or more times for appointments you may be dismissed from the clinic at the providers discretion.     Again, thank you for choosing [facility].  Our hope is that these requests will decrease the amount of time that you wait before being seen by our physicians.       _____________________________________________________________  Should you have questions after your visit to [facility], please contact our office at [phone] between the hours of 8:30 a.m. and 4:30 p.m.  Voicemails left after 4:30 p.m. will not be returned until the following business day.  For prescription refill requests, have your pharmacy contact our office.         Resources For Cancer Patients and their Caregivers ? American Cancer Society: Can assist with transportation, wigs,  general needs, runs Look Good Feel Better.        ([phone] ? Cancer Care: Provides financial assistance, online support groups, medication/co-pay assistance.  1-800-813-HOPE 214 338 9912) ? [employer] Assists [place] cancer patients and their families through emotional , educational and financial support.  [phone] ? [place] DSS Where to apply for food stamps, Medicaid and utility assistance. (254)094-8962 ? RCATS: Transportation to medical appointments. [phone] ? Social Security Administration: May apply for disability if have a Stage IV cancer. [phone] 640-318-1494 ? [employer], Disability and Transit Services: Assists with nutrition, care and transit needs. [place] Support Programs: @10RELATIVEDAYS @ > Cancer Support Group  2nd Tuesday of the month 1pm-2pm, Journey Room  > Creative Journey  3rd Tuesday of the month 1130am-1pm, Journey Room  > Look Good Feel Better  1st Wednesday of the month 10am-12 noon, Journey Room (Call [place] to register [phone])

## 2016-09-14 NOTE — Progress Notes (Signed)
CRITICAL VALUE ALERT Critical value received:  WBC-0.6 Date of notification:  09/14/16 Time of notification: 1010 Critical value read back:  Yes.   Nurse who received alert:  M.Bambi Fehnel, LPN  MD notified (1st page):  S. Whitney Muse, MD

## 2016-09-14 NOTE — Assessment & Plan Note (Signed)
DLBCL with bone marrow involvement complicated by possible involvement of sigmoid colon.  Oncology history developed.  Pre-treatment labs today: CBC diff, CMET, LDH, Uric acid, phosphorus, and magnesium.  I personally reviewed and went over laboratory results with the patient.  The results are noted within this dictation.  Labs satisfy treatment criteria and therefore, he will be treated today.  Progressive anemia is noted and likley secondary to extensive bone marrow involvement.    Labs in ~7 days: CBC diff.  If anemia persists or progresses, will consider PRBC transfusion.  He was recently admitted to [facility].  He has seen Dr. Ladona Horns in consultation regarding his bowel involvement/issue.  We called the patient's daughter 2 days ago and she reports that Dr. Ladona Horns is not interested in surgically managing the patient's colon due to extensive recovery time in addition to recent exacerbation.  Per the patient, Dr. Ladona Horns does not feel that there is lymphomatous involvement of colon.  As a result, he wishes to follow conservatively.  Unfortunately, given the patient's disease, we will push forward with treatment as planned.  He is advised to report to the ED with any issues.  Return next week for cycle #3.  Return in 2 weeks for follow-up and cycle #4.  Based upon response, I suspect the addition of Bendamustine will be needed.

## 2016-09-14 NOTE — Assessment & Plan Note (Signed)
DLBCL with bone marrow involvement complicated by possible involvement of sigmoid colon.  Oncology history developed.  Pre-treatment labs today: CBC diff, CMET, LDH, Uric acid, phosphorus, and magnesium.  I personally reviewed and went over laboratory results with the patient.  The results are noted within this dictation.  Labs satisfy treatment criteria and therefore, he will be treated today.  Progressive anemia is noted and likley secondary to extensive bone marrow involvement.    Labs in ~7 days: CBC diff.  If anemia persists or progresses, will consider PRBC transfusion.  He was recently admitted to [facility].  He has seen Dr. Ladona Horns in consultation regarding his bowel involvement/issue.  We called the patient's daughter 2 days ago and she reports that Dr. Ladona Horns is not interested in surgically managing the patient's colon due to extensive recovery time in addition to recent exacerbation.  Per the patient, Dr. Ladona Horns does not feel that there is lymphomatous involvement of colon.  As a result, he wishes to follow conservatively.  Unfortunately, given the patient's disease, we will push forward with treatment as planned.  He is advised to report to the ED with any issues.  Return next week for cycle #3.  Return in 2 weeks for follow-up and cycle #4.  Based upon response, I suspect the addition of Bendamustine will be needed.

## 2016-09-14 NOTE — Progress Notes (Signed)
Treatment held today for 2 weeks, follow up as scheduled. Vitals stable, discharged from clinic ambulatory.  Paul Keith presents today for injection per MD orders. Neulasta 6mg  administered SQ in left Abdomen. Administration without incident. Patient tolerated well.

## 2016-09-15 ENCOUNTER — Ambulatory Visit (HOSPITAL_COMMUNITY): Payer: Medicare Other

## 2016-09-16 ENCOUNTER — Other Ambulatory Visit (HOSPITAL_COMMUNITY): Payer: Self-pay | Admitting: Emergency Medicine

## 2016-09-16 LAB — URINE CULTURE: CULTURE: NO GROWTH

## 2016-09-16 MED ORDER — MECLIZINE HCL 32 MG PO TABS: 32.0000 mg | ORAL_TABLET | Freq: Three times a day (TID) | ORAL | 1 refills | Status: DC | PRN

## 2016-09-16 NOTE — Progress Notes (Signed)
Pt called and stated that he had been having some dizziness, he had an old prescription for meclizine and he had taking his last few.  They seemed to help.  Wanted to know if Dr Whitney Muse would give him something.  Spoke with Dr Whitney Muse, e-scribed meclizine into [facility].  Pt verbalized understanding.

## 2016-09-28 ENCOUNTER — Encounter (HOSPITAL_COMMUNITY): Payer: Self-pay | Admitting: Emergency Medicine

## 2016-09-28 ENCOUNTER — Other Ambulatory Visit (HOSPITAL_COMMUNITY): Payer: Self-pay | Admitting: Emergency Medicine

## 2016-09-28 ENCOUNTER — Ambulatory Visit (HOSPITAL_COMMUNITY): Payer: Medicare Other

## 2016-09-28 ENCOUNTER — Encounter (HOSPITAL_BASED_OUTPATIENT_CLINIC_OR_DEPARTMENT_OTHER): Payer: Medicare Other

## 2016-09-28 ENCOUNTER — Other Ambulatory Visit (HOSPITAL_COMMUNITY): Payer: Self-pay | Admitting: Oncology

## 2016-09-28 ENCOUNTER — Encounter: Payer: Self-pay | Admitting: *Deleted

## 2016-09-28 ENCOUNTER — Encounter (HOSPITAL_COMMUNITY): Payer: Medicare Other | Attending: Hematology & Oncology | Admitting: Oncology

## 2016-09-28 ENCOUNTER — Encounter (HOSPITAL_COMMUNITY): Payer: Self-pay | Admitting: Oncology

## 2016-09-28 VITALS — BP 114/57 | HR 88 | Temp 97.9°F | Resp 16

## 2016-09-28 DIAGNOSIS — Z9889 Other specified postprocedural states: Secondary | ICD-10-CM | POA: Insufficient documentation

## 2016-09-28 DIAGNOSIS — R5383 Other fatigue: Secondary | ICD-10-CM | POA: Insufficient documentation

## 2016-09-28 DIAGNOSIS — D61818 Other pancytopenia: Secondary | ICD-10-CM | POA: Insufficient documentation

## 2016-09-28 DIAGNOSIS — Z5112 Encounter for antineoplastic immunotherapy: Secondary | ICD-10-CM | POA: Diagnosis not present

## 2016-09-28 DIAGNOSIS — Z87891 Personal history of nicotine dependence: Secondary | ICD-10-CM | POA: Diagnosis not present

## 2016-09-28 DIAGNOSIS — C8800 Waldenstrom macroglobulinemia not having achieved remission: Secondary | ICD-10-CM

## 2016-09-28 DIAGNOSIS — Z79899 Other long term (current) drug therapy: Secondary | ICD-10-CM | POA: Insufficient documentation

## 2016-09-28 DIAGNOSIS — D472 Monoclonal gammopathy: Secondary | ICD-10-CM

## 2016-09-28 DIAGNOSIS — C8300 Small cell B-cell lymphoma, unspecified site: Secondary | ICD-10-CM | POA: Diagnosis not present

## 2016-09-28 DIAGNOSIS — Z5111 Encounter for antineoplastic chemotherapy: Secondary | ICD-10-CM

## 2016-09-28 DIAGNOSIS — D649 Anemia, unspecified: Secondary | ICD-10-CM | POA: Diagnosis not present

## 2016-09-28 DIAGNOSIS — D696 Thrombocytopenia, unspecified: Secondary | ICD-10-CM | POA: Insufficient documentation

## 2016-09-28 DIAGNOSIS — D479 Neoplasm of uncertain behavior of lymphoid, hematopoietic and related tissue, unspecified: Secondary | ICD-10-CM

## 2016-09-28 LAB — COMPREHENSIVE METABOLIC PANEL
ALBUMIN: 3.5 g/dL (ref 3.5–5.0)
ALK PHOS: 75 U/L (ref 38–126)
ALT: 12 U/L — AB (ref 17–63)
AST: 16 U/L (ref 15–41)
Anion gap: 4 — ABNORMAL LOW (ref 5–15)
BILIRUBIN TOTAL: 0.5 mg/dL (ref 0.3–1.2)
BUN: 21 mg/dL — ABNORMAL HIGH (ref 6–20)
CALCIUM: 8.9 mg/dL (ref 8.9–10.3)
CO2: 27 mmol/L (ref 22–32)
CREATININE: 1.17 mg/dL (ref 0.61–1.24)
Chloride: 106 mmol/L (ref 101–111)
GFR calc non Af Amer: 55 mL/min — ABNORMAL LOW (ref >60)
GLUCOSE: 94 mg/dL (ref 65–99)
Potassium: 4.2 mmol/L (ref 3.5–5.1)
SODIUM: 137 mmol/L (ref 135–145)
TOTAL PROTEIN: 5.7 g/dL — AB (ref 6.5–8.1)

## 2016-09-28 LAB — LACTATE DEHYDROGENASE: LDH: 104 U/L (ref 98–192)

## 2016-09-28 LAB — CBC WITH DIFFERENTIAL/PLATELET
Basophils Absolute: 0 10*3/uL (ref 0.0–0.1)
Basophils Relative: 0 %
EOS ABS: 0.1 10*3/uL (ref 0.0–0.7)
Eosinophils Relative: 2 %
HEMATOCRIT: 30.4 % — AB (ref 39.0–52.0)
HEMOGLOBIN: 10.1 g/dL — AB (ref 13.0–17.0)
LYMPHS ABS: 0.4 10*3/uL — AB (ref 0.7–4.0)
LYMPHS PCT: 7 %
MCH: 31.8 pg (ref 26.0–34.0)
MCHC: 33.2 g/dL (ref 30.0–36.0)
MCV: 95.6 fL (ref 78.0–100.0)
Monocytes Absolute: 0.4 10*3/uL (ref 0.1–1.0)
Monocytes Relative: 9 %
NEUTROS ABS: 4.1 10*3/uL (ref 1.7–7.7)
NEUTROS PCT: 82 %
Platelets: 105 10*3/uL — ABNORMAL LOW (ref 150–400)
RBC: 3.18 MIL/uL — AB (ref 4.22–5.81)
RDW: 16.9 % — ABNORMAL HIGH (ref 11.5–15.5)
WBC: 5 10*3/uL (ref 4.0–10.5)

## 2016-09-28 LAB — URIC ACID: Uric Acid, Serum: 6.9 mg/dL (ref 4.4–7.6)

## 2016-09-28 LAB — PHOSPHORUS: Phosphorus: 3.3 mg/dL (ref 2.5–4.6)

## 2016-09-28 LAB — MAGNESIUM: Magnesium: 2 mg/dL (ref 1.7–2.4)

## 2016-09-28 MED ORDER — ACETAMINOPHEN 325 MG PO TABS
650.0000 mg | ORAL_TABLET | Freq: Once | ORAL | Status: AC
  Administered 2016-09-28: 650 mg via ORAL

## 2016-09-28 MED ORDER — HEPARIN SOD (PORK) LOCK FLUSH 100 UNIT/ML IV SOLN
INTRAVENOUS | Status: AC
Start: 2016-09-28 — End: 2016-09-28
  Filled 2016-09-28: qty 5

## 2016-09-28 MED ORDER — SODIUM CHLORIDE 0.9 % IV SOLN
375.0000 mg/m2 | Freq: Once | INTRAVENOUS | Status: AC
  Administered 2016-09-28: 700 mg via INTRAVENOUS
  Filled 2016-09-28: qty 50

## 2016-09-28 MED ORDER — HEPARIN SOD (PORK) LOCK FLUSH 100 UNIT/ML IV SOLN
500.0000 [IU] | Freq: Once | INTRAVENOUS | Status: AC | PRN
  Administered 2016-09-28: 500 [IU]

## 2016-09-28 MED ORDER — DIPHENHYDRAMINE HCL 25 MG PO CAPS
50.0000 mg | ORAL_CAPSULE | Freq: Once | ORAL | Status: AC
  Administered 2016-09-28: 50 mg via ORAL
  Filled 2016-09-28: qty 2

## 2016-09-28 MED ORDER — SODIUM CHLORIDE 0.9 % IV SOLN
Freq: Once | INTRAVENOUS | Status: AC
  Administered 2016-09-28: 10:00:00 via INTRAVENOUS

## 2016-09-28 MED ORDER — SODIUM CHLORIDE 0.9% FLUSH
10.0000 mL | INTRAVENOUS | Status: DC | PRN
  Administered 2016-09-28: 10 mL
  Filled 2016-09-28: qty 10

## 2016-09-28 MED ORDER — PALONOSETRON HCL INJECTION 0.25 MG/5ML
0.2500 mg | Freq: Once | INTRAVENOUS | Status: AC
  Administered 2016-09-28: 0.25 mg via INTRAVENOUS
  Filled 2016-09-28: qty 5

## 2016-09-28 MED ORDER — BENDAMUSTINE HCL CHEMO INJECTION 100 MG/4ML
90.0000 mg/m2 | Freq: Once | INTRAVENOUS | Status: AC
  Administered 2016-09-28: 175 mg via INTRAVENOUS
  Filled 2016-09-28: qty 7

## 2016-09-28 MED ORDER — DEXAMETHASONE SODIUM PHOSPHATE 10 MG/ML IJ SOLN
10.0000 mg | Freq: Once | INTRAMUSCULAR | Status: AC
  Administered 2016-09-28: 10 mg via INTRAVENOUS
  Filled 2016-09-28: qty 1

## 2016-09-28 NOTE — Progress Notes (Signed)
Filled out paperwork stating pt couldn't travel until the end of the year.

## 2016-09-28 NOTE — Progress Notes (Signed)
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  INTERVAL HISTORY: [name] 82 y.o. male returns for followup of DLBCL with bone marrow involvement complicated by possible involvement of sigmoid colon with bone marrow aspiration and biopsy on 05/06/2016 showing extensive involvement by NHL B cell, admixed plasma cell component to a lesser degree which also shows a kappa light chain restriction (DDX: lymphoplasmacytic lymphoma, marginal zone lymphoma, follicular lymphoma with plasma cell differentiation, splenic lymphoma).  Low grade process is favored.  PET imaging demonstrates diffuse osseous uptake secondary to NHL. 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 The pattern is somewhat nonspecific but the findings are consistent with non-Hodgkin B-cell lymphoma.      05/10/2016 Pathology Results    Bone Marrow, Aspirate,Biopsy, and Clot, right ilium - HYPERCELLULAR BONE MARROW WITH EXTENSIVE INVOLVEMENT BY NON-HODGKIN'S B-CELL LYMPHOMA.      05/13/2016 PET scan    Splenomegaly and mild diffuse increase metabolic activity in the spleen consistent with lymphomatous involvement. Diffuse osseous uptake likely due to diffuse lymphoma. Recommend correlation with any marrow stimulating drugs or rebound phenomenon.      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 Adjacent small amount of fluid has decreased since prior examination with interval  clearing of previously noted small amount of extraluminal air.      06/03/2016 - 07/23/2016 Antibody Plan    Single-agent Rituxan      08/17/2016 -  Chemotherapy    The patient had palonosetron (ALOXI) injection 0.25 mg, 0.25 mg, Intravenous,  Once, 1 of 4 cycles  pegfilgrastim (NEULASTA ONPRO KIT) injection 6 mg, 6 mg, Subcutaneous, Once, 1 of 4 cycles  riTUXimab (RITUXAN) 700 mg in sodium chloride 0.9 % 250 mL (2.1875 mg/mL) chemo infusion, 375 mg/m2 = 700 mg, Intravenous,  Once, 1 of 4 cycles  bendamustine (BENDEKA) 175 mg in sodium chloride 0.9 % 50 mL (3.0702 mg/mL) chemo infusion, 90 mg/m2 = 175 mg, Intravenous,  Once, 1 of 4 cycles  for chemotherapy treatment.        "Last week was the best week I've had in months."  He denies any complaints.    He denies any fevers or chills.  He denies any abdominal pain.  No recurrence of diverticulitis.  He is following a "diverticulitis" diet.  He went to his granddaughter's wedding in [place], [place] last weekend.  He had a good time.  Review of Systems  Constitutional: Negative.  Negative for chills, fever, malaise/fatigue and weight loss.  HENT: Negative.   Respiratory: Negative.  Negative for cough and sputum production.   Cardiovascular: Negative.  Negative for chest pain.  Gastrointestinal: Negative.  Negative for abdominal pain, blood in stool, constipation, diarrhea, melena, nausea and vomiting.  Genitourinary: Negative.   Musculoskeletal: Negative.   Skin: Negative.   Neurological: Negative.  Negative for weakness.  Endo/Heme/Allergies: Negative.   Psychiatric/Behavioral: Negative.     Past Medical History:  Diagnosis Date  . Anemia 04/22/2016  .  Diverticulosis    pt reports recent admission for diverticulosis perforation this past week  . 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 Pancytopenia ([place]) 04/22/2016    Past Surgical History:  Procedure Laterality  Date  . CATARACT EXTRACTION Bilateral   . TONSILLECTOMY      History reviewed. No pertinent family history.  Social History   Social History  . Marital status: Married    Spouse name: N/A  . Number of children: N/A  . Years of education: N/A   Social History Main Topics  . Smoking status: Former Smoker    Quit date: 09/15/1962  . Smokeless tobacco: Never Used  . Alcohol use Yes     Comment: OCCASSIONAL  . Drug use: No  . Sexual activity: Not Asked   Other Topics Concern  . None   Social History Narrative  . None     PHYSICAL EXAMINATION  ECOG PERFORMANCE STATUS: 1 - Symptomatic but completely ambulatory  Vitals:   09/28/16 0840  BP: (!) 129/53  Pulse: 78  Resp: 16  Temp: 97.6 F (36.4 C)    GENERAL:alert, no distress, well nourished, well developed, comfortable, cooperative, smiling and accompanied by his wife. SKIN: skin color, texture, turgor are normal, no rashes or significant lesions HEAD: Normocephalic, No masses, lesions, tenderness or abnormalities EYES: normal, EOMI, Conjunctiva are pink and non-injected EARS: External ears normal OROPHARYNX:lips, buccal mucosa, and tongue normal and mucous membranes are moist  NECK: supple, trachea midline LYMPH:  no palpable lymphadenopathy BREAST:not examined LUNGS: clear to auscultation and percussion HEART: regular rate & rhythm, no murmurs, no gallops, S1 normal and S2 normal ABDOMEN:abdomen soft, non-tender and normal bowel sounds BACK: Back symmetric, no curvature., No CVA tenderness EXTREMITIES:less then 2 second capillary refill, no joint deformities, effusion, or inflammation, no skin discoloration, no cyanosis  NEURO: alert & oriented x 3 with fluent speech, no focal motor/sensory deficits, gait normal  LABORATORY DATA: CBC    Component Value Date/Time   WBC 5.0 09/28/2016 0853   RBC 3.18 (L) 09/28/2016 0853   HGB 10.1 (L) 09/28/2016 0853   HCT 30.4 (L) 09/28/2016 0853   PLT PENDING 09/28/2016  0853   MCV 95.6 09/28/2016 0853   MCH 31.8 09/28/2016 0853   MCHC 33.2 09/28/2016 0853   RDW 16.9 (H) 09/28/2016 0853   LYMPHSABS 0.4 (L) 09/28/2016 0853   MONOABS 0.4 09/28/2016 0853   EOSABS 0.1 09/28/2016 0853   BASOSABS 0.0 09/28/2016 0853      Chemistry      Component Value Date/Time   NA 133 (L) 09/14/2016 0925   K 4.1 09/14/2016 0925   CL 103 09/14/2016 0925   CO2 25 09/14/2016 0925   BUN 18 09/14/2016 0925   CREATININE 1.15 09/14/2016 0925      Component Value Date/Time   CALCIUM 8.7 (L) 09/14/2016 0925   ALKPHOS 64 09/14/2016 0925   AST 18 09/14/2016 0925   ALT 14 (L) 09/14/2016 0925   BILITOT 0.6 09/14/2016 0925        PENDING LABS:   RADIOGRAPHIC STUDIES:  No results found.   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  Oncology history updated.  Pre-treatment labs today: CBC diff, CMET, LDH, Uric acid, phosphorus, and magnesium.  I personally reviewed and went over laboratory results with the patient.  The results are noted within this dictation.  WBC and neutrophil count is WNL today and meets treatment parameters.  He has seen Dr. Ladona Horns in consultation regarding his bowel involvement/issue.  We called the patient's daughter 2 days ago and she reports that Dr. Ladona Horns is not interested in surgically managing the patient's colon due to extensive recovery time in addition to recent exacerbation.  Per the patient, Dr. Ladona Horns does not feel that there is lymphomatous involvement of colon.  As a result, he wishes to follow conservatively.  He is advised to report to the ED with any issues.  Last cycle of therapy was held x 2 weeks due to severe neutropenia.  Neulasta was given.  Treatment today and tomorrow.  Return in 4 weeks for follow-up and next cycle of chemotherapy.   ORDERS PLACED FOR THIS ENCOUNTER: Orders Placed This  Encounter  Procedures  . Lactate dehydrogenase  . Magnesium  . Phosphorus  . Uric acid    MEDICATIONS PRESCRIBED THIS ENCOUNTER: No orders of the defined types were placed in this encounter.   THERAPY PLAN:  Treatment as outlined.  All questions were answered. The patient knows to call the clinic with any problems, questions or concerns. We can certainly see the patient much sooner if necessary.  Patient and plan discussed with Dr. Ancil Linsey and she is in agreement with the aforementioned.   This note is electronically signed by: Doy Mince 09/28/2016 9:19 AM

## 2016-09-28 NOTE — Assessment & Plan Note (Addendum)
DLBCL with bone marrow involvement complicated by possible involvement of sigmoid colon.  Oncology history updated.  Pre-treatment labs today: CBC diff, CMET, LDH, Uric acid, phosphorus, and magnesium.  I personally reviewed and went over laboratory results with the patient.  The results are noted within this dictation.   WBC and neutrophil count is WNL today and meets treatment parameters.  He has seen Dr. Ladona Horns in consultation regarding his bowel involvement/issue.  We called the patient's daughter 2 days ago and she reports that Dr. Ladona Horns is not interested in surgically managing the patient's colon due to extensive recovery time in addition to recent exacerbation.  Per the patient, Dr. Ladona Horns does not feel that there is lymphomatous involvement of colon.  As a result, he wishes to follow conservatively.  He is advised to report to the ED with any issues.  Last cycle of therapy was held x 2 weeks due to severe neutropenia.  Neulasta was given.  Treatment today and tomorrow.  Return in 4 weeks for follow-up and next cycle of chemotherapy.

## 2016-09-28 NOTE — Patient Instructions (Signed)
[facility] Discharge Instructions for Patients Receiving Chemotherapy   Beginning January 23rd 2017 lab work for the [facility] will be done in the  Main lab at [facility] on 1st floor. If you have a lab appointment with the [place] please come in thru the  Main Entrance and check in at the main information desk   Today you received the following chemotherapy agents Rituxan and bendamustine  To help prevent nausea and vomiting after your treatment, we encourage you to take your nausea medication     If you develop nausea and vomiting, or diarrhea that is not controlled by your medication, call the clinic.  The clinic phone number is [phone]. Office hours are Monday-Friday 8:30am-5:00pm.  BELOW ARE SYMPTOMS THAT SHOULD BE REPORTED IMMEDIATELY:  *FEVER GREATER THAN 101.0 F  *CHILLS WITH OR WITHOUT FEVER  NAUSEA AND VOMITING THAT IS NOT CONTROLLED WITH YOUR NAUSEA MEDICATION  *UNUSUAL SHORTNESS OF BREATH  *UNUSUAL BRUISING OR BLEEDING  TENDERNESS IN MOUTH AND THROAT WITH OR WITHOUT PRESENCE OF ULCERS  *URINARY PROBLEMS  *BOWEL PROBLEMS  UNUSUAL RASH Items with * indicate a potential emergency and should be followed up as soon as possible. If you have an emergency after office hours please contact your primary care physician or go to the nearest emergency department.  Please call the clinic during office hours if you have any questions or concerns.   You may also contact the Patient Navigator at [phone] should you have any questions or need assistance in obtaining follow up care.      Resources For Cancer Patients and their Caregivers ? American Cancer Society: Can assist with transportation, wigs, general needs, runs Look Good Feel Better.        951-810-8171 ? Cancer Care: Provides financial assistance, online support groups, medication/co-pay assistance.  1-800-813-HOPE 705-400-1410) ? [place] Assists  [place] cancer patients and their families through emotional , educational and financial support.  4635062854 ? [place] DSS Where to apply for food stamps, Medicaid and utility assistance. 831 412 1290 ? [employer]ATS: Transportation to medical appointments. ([phone] ? Social Security Administration: May apply for disability if have a Stage IV cancer. [phone] ? [employer], Disability and Transit Services: Assists with nutrition, care and transit needs. [phone]

## 2016-09-28 NOTE — Progress Notes (Signed)
error 

## 2016-09-28 NOTE — Patient Instructions (Signed)
[place] at [facility] Discharge Instructions  RECOMMENDATIONS MADE BY THE CONSULTANT AND ANY TEST RESULTS WILL BE SENT TO YOUR REFERRING PHYSICIAN.  You were seen today by Kirby Crigler PA-C. Depending on labs, Treatment today and tomorrow. Return in 4 weeks for treatment and follow up.   Thank you for choosing [place] at [facility] to provide your oncology and hematology care.  To afford each patient quality time with our provider, please arrive at least 15 minutes before your scheduled appointment time.   Beginning January 23rd 2017 lab work for the [name] will be done in the  Main lab at [facility] on 1st floor. If you have a lab appointment with the [place] please come in thru the  Main Entrance and check in at the main information desk  You need to re-schedule your appointment should you arrive 10 or more minutes late.  We strive to give you quality time with our providers, and arriving late affects you and other patients whose appointments are after yours.  Also, if you no show three or more times for appointments you may be dismissed from the clinic at the providers discretion.     Again, thank you for choosing [facility].  Our hope is that these requests will decrease the amount of time that you wait before being seen by our physicians.       _____________________________________________________________  Should you have questions after your visit to [facility], please contact our office at [phone] between the hours of 8:30 a.m. and 4:30 p.m.  Voicemails left after 4:30 p.m. will not be returned until the following business day.  For prescription refill requests, have your pharmacy contact our office.         Resources For Cancer Patients and their Caregivers ? American Cancer Society: Can assist with transportation, wigs, general needs, runs Look Good Feel Better.         531 084 6048 ? Cancer Care: Provides financial assistance, online support groups, medication/co-pay assistance.  1-800-813-HOPE 579-883-3129) ? [facility] Assists [place] cancer patients and their families through emotional , educational and financial support.  413 215 4747 ? [place] DSS Where to apply for food stamps, Medicaid and utility assistance. 252-336-7526 ? RCATS: Transportation to medical appointments. 959-133-3392 ? Social Security Administration: May apply for disability if have a Stage IV cancer. 385-651-2320 254-439-2545 ? [employer], Disability and Transit Services: Assists with nutrition, care and transit needs. [place] Support Programs: @10RELATIVEDAYS @ > Cancer Support Group  2nd Tuesday of the month 1pm-2pm, Journey Room  > Creative Journey  3rd Tuesday of the month 1130am-1pm, Journey Room  > Look Good Feel Better  1st Wednesday of the month 10am-12 noon, Journey Room (Call [place] to register [phone])

## 2016-09-28 NOTE — Progress Notes (Signed)
[facility] Clinical Social Work  Clinical Social Work was referred by CSW rounding in the infusion area for assessment of psychosocial needs. Clinical Social Worker met with patient at [facility] briefly to introduce self, explain role of CSW,  to offer support and assess for needs.  No needs identified currently. Pt agrees to reach out as needed.     Clinical Social Work interventions: Resource education  [place]rier Ivy Puryear, LCSW [facility] Tuesdays   [phone]  

## 2016-09-28 NOTE — Progress Notes (Signed)
Labs reviewed with MD, proceed with treatment.  Chemotherapy given today. Patient tolerated it well without problems. Vitals stable and discharged home with wife, ambulatory.follow up as scheduled.

## 2016-09-29 ENCOUNTER — Encounter (HOSPITAL_BASED_OUTPATIENT_CLINIC_OR_DEPARTMENT_OTHER): Payer: Medicare Other

## 2016-09-29 ENCOUNTER — Encounter (HOSPITAL_COMMUNITY): Payer: Self-pay

## 2016-09-29 VITALS — BP 96/47 | HR 77 | Temp 98.0°F | Resp 16 | Wt 158.8 lb

## 2016-09-29 DIAGNOSIS — Z5111 Encounter for antineoplastic chemotherapy: Secondary | ICD-10-CM | POA: Diagnosis not present

## 2016-09-29 DIAGNOSIS — D472 Monoclonal gammopathy: Secondary | ICD-10-CM

## 2016-09-29 DIAGNOSIS — C8300 Small cell B-cell lymphoma, unspecified site: Secondary | ICD-10-CM

## 2016-09-29 DIAGNOSIS — C880 Waldenstrom macroglobulinemia: Secondary | ICD-10-CM

## 2016-09-29 MED ORDER — SODIUM CHLORIDE 0.9 % IV SOLN
Freq: Once | INTRAVENOUS | Status: AC
  Administered 2016-09-29: 14:00:00 via INTRAVENOUS

## 2016-09-29 MED ORDER — HEPARIN SOD (PORK) LOCK FLUSH 100 UNIT/ML IV SOLN
500.0000 [IU] | Freq: Once | INTRAVENOUS | Status: AC | PRN
  Administered 2016-09-29: 500 [IU]
  Filled 2016-09-29: qty 5

## 2016-09-29 MED ORDER — PEGFILGRASTIM 6 MG/0.6ML ~~LOC~~ PSKT
6.0000 mg | PREFILLED_SYRINGE | Freq: Once | SUBCUTANEOUS | Status: AC
  Administered 2016-09-29: 6 mg via SUBCUTANEOUS
  Filled 2016-09-29: qty 0.6

## 2016-09-29 MED ORDER — SODIUM CHLORIDE 0.9% FLUSH
10.0000 mL | INTRAVENOUS | Status: DC | PRN
  Administered 2016-09-29: 10 mL
  Filled 2016-09-29: qty 10

## 2016-09-29 MED ORDER — BENDAMUSTINE HCL CHEMO INJECTION 100 MG/4ML
90.0000 mg/m2 | Freq: Once | INTRAVENOUS | Status: AC
  Administered 2016-09-29: 175 mg via INTRAVENOUS
  Filled 2016-09-29: qty 4

## 2016-09-29 MED ORDER — DEXAMETHASONE SODIUM PHOSPHATE 10 MG/ML IJ SOLN
INTRAMUSCULAR | Status: AC
Start: 2016-09-29 — End: 2016-09-29
  Filled 2016-09-29: qty 1

## 2016-09-29 MED ORDER — DEXAMETHASONE SODIUM PHOSPHATE 10 MG/ML IJ SOLN
10.0000 mg | Freq: Once | INTRAMUSCULAR | Status: AC
  Administered 2016-09-29: 10 mg via INTRAVENOUS

## 2016-09-29 NOTE — Progress Notes (Signed)
Chemotherapy given today per orders. Patient tolerated it well, no problems.Vitals stable and discharged from clinic with wife, ambulatory.

## 2016-09-29 NOTE — Patient Instructions (Signed)
[facility] Discharge Instructions for Patients Receiving Chemotherapy   Beginning January 23rd 2017 lab work for the [facility] will be done in the  Main lab at [facility] on 1st floor. If you have a lab appointment with the [place] please come in thru the  Main Entrance and check in at the main information desk   Today you received the following chemotherapy agent Bendamustin  To help prevent nausea and vomiting after your treatment, we encourage you to take your nausea medication     If you develop nausea and vomiting, or diarrhea that is not controlled by your medication, call the clinic.  The clinic phone number is [phone]. Office hours are Monday-Friday 8:30am-5:00pm.  BELOW ARE SYMPTOMS THAT SHOULD BE REPORTED IMMEDIATELY:  *FEVER GREATER THAN 101.0 F  *CHILLS WITH OR WITHOUT FEVER  NAUSEA AND VOMITING THAT IS NOT CONTROLLED WITH YOUR NAUSEA MEDICATION  *UNUSUAL SHORTNESS OF BREATH  *UNUSUAL BRUISING OR BLEEDING  TENDERNESS IN MOUTH AND THROAT WITH OR WITHOUT PRESENCE OF ULCERS  *URINARY PROBLEMS  *BOWEL PROBLEMS  UNUSUAL RASH Items with * indicate a potential emergency and should be followed up as soon as possible. If you have an emergency after office hours please contact your primary care physician or go to the nearest emergency department.  Please call the clinic during office hours if you have any questions or concerns.   You may also contact the Patient Navigator at [phone] should you have any questions or need assistance in obtaining follow up care.      Resources For Cancer Patients and their Caregivers ? American Cancer Society: Can assist with transportation, wigs, general needs, runs Look Good Feel Better.        [phone] ? Cancer Care: Provides financial assistance, online support groups, medication/co-pay assistance.  1-800-813-HOPE 2608103550) ? [place] Assists [place]  cancer patients and their families through emotional , educational and financial support.  671-702-5880 ? [place] DSS Where to apply for food stamps, Medicaid and utility assistance. 3853592568 ? RCATS: Transportation to medical appointments. ([phone] ? Social Security Administration: May apply for disability if have a Stage IV cancer. 540-802-0773 [phone] ? [employer], Disability and Transit Services: Assists with nutrition, care and transit needs. [phone]

## 2016-10-09 ENCOUNTER — Encounter (HOSPITAL_COMMUNITY): Payer: Self-pay | Admitting: Hematology & Oncology

## 2016-10-14 ENCOUNTER — Ambulatory Visit: Payer: Medicare Other | Admitting: Gastroenterology

## 2016-10-19 ENCOUNTER — Other Ambulatory Visit (HOSPITAL_COMMUNITY): Payer: Self-pay | Admitting: Pharmacist

## 2016-10-26 ENCOUNTER — Encounter (HOSPITAL_COMMUNITY): Payer: Medicare Other | Attending: Hematology & Oncology | Admitting: Hematology & Oncology

## 2016-10-26 ENCOUNTER — Encounter (HOSPITAL_COMMUNITY): Payer: Self-pay | Admitting: Hematology & Oncology

## 2016-10-26 ENCOUNTER — Encounter (HOSPITAL_BASED_OUTPATIENT_CLINIC_OR_DEPARTMENT_OTHER): Payer: Medicare Other

## 2016-10-26 ENCOUNTER — Encounter (HOSPITAL_COMMUNITY): Payer: Self-pay | Admitting: Lab

## 2016-10-26 VITALS — BP 135/72 | HR 100 | Temp 97.6°F | Resp 16 | Wt 156.8 lb

## 2016-10-26 VITALS — BP 118/54 | HR 74 | Temp 98.0°F | Resp 18

## 2016-10-26 DIAGNOSIS — D479 Neoplasm of uncertain behavior of lymphoid, hematopoietic and related tissue, unspecified: Secondary | ICD-10-CM

## 2016-10-26 DIAGNOSIS — R32 Unspecified urinary incontinence: Secondary | ICD-10-CM

## 2016-10-26 DIAGNOSIS — D696 Thrombocytopenia, unspecified: Secondary | ICD-10-CM | POA: Diagnosis not present

## 2016-10-26 DIAGNOSIS — C8800 Waldenstrom macroglobulinemia not having achieved remission: Secondary | ICD-10-CM

## 2016-10-26 DIAGNOSIS — Z87891 Personal history of nicotine dependence: Secondary | ICD-10-CM | POA: Diagnosis not present

## 2016-10-26 DIAGNOSIS — C8300 Small cell B-cell lymphoma, unspecified site: Secondary | ICD-10-CM

## 2016-10-26 DIAGNOSIS — Z5112 Encounter for antineoplastic immunotherapy: Secondary | ICD-10-CM

## 2016-10-26 DIAGNOSIS — Z9889 Other specified postprocedural states: Secondary | ICD-10-CM | POA: Insufficient documentation

## 2016-10-26 DIAGNOSIS — D472 Monoclonal gammopathy: Secondary | ICD-10-CM

## 2016-10-26 DIAGNOSIS — Z79899 Other long term (current) drug therapy: Secondary | ICD-10-CM | POA: Insufficient documentation

## 2016-10-26 DIAGNOSIS — D61818 Other pancytopenia: Secondary | ICD-10-CM | POA: Diagnosis not present

## 2016-10-26 DIAGNOSIS — R5383 Other fatigue: Secondary | ICD-10-CM | POA: Diagnosis not present

## 2016-10-26 DIAGNOSIS — Z5111 Encounter for antineoplastic chemotherapy: Secondary | ICD-10-CM

## 2016-10-26 DIAGNOSIS — D649 Anemia, unspecified: Secondary | ICD-10-CM | POA: Insufficient documentation

## 2016-10-26 LAB — COMPREHENSIVE METABOLIC PANEL
ALBUMIN: 3.9 g/dL (ref 3.5–5.0)
ALK PHOS: 66 U/L (ref 38–126)
ALT: 18 U/L (ref 17–63)
AST: 20 U/L (ref 15–41)
Anion gap: 6 (ref 5–15)
BILIRUBIN TOTAL: 0.7 mg/dL (ref 0.3–1.2)
BUN: 18 mg/dL (ref 6–20)
CALCIUM: 9.1 mg/dL (ref 8.9–10.3)
CO2: 26 mmol/L (ref 22–32)
Chloride: 104 mmol/L (ref 101–111)
Creatinine, Ser: 1.15 mg/dL (ref 0.61–1.24)
GFR calc Af Amer: >60 mL/min (ref >60)
GFR calc non Af Amer: 57 mL/min — ABNORMAL LOW (ref >60)
GLUCOSE: 104 mg/dL — AB (ref 65–99)
Potassium: 4.1 mmol/L (ref 3.5–5.1)
SODIUM: 136 mmol/L (ref 135–145)
TOTAL PROTEIN: 5.9 g/dL — AB (ref 6.5–8.1)

## 2016-10-26 LAB — CBC WITH DIFFERENTIAL/PLATELET
BASOS PCT: 0 %
Basophils Absolute: 0 10*3/uL (ref 0.0–0.1)
EOS ABS: 0.1 10*3/uL (ref 0.0–0.7)
Eosinophils Relative: 2 %
HEMATOCRIT: 34.3 % — AB (ref 39.0–52.0)
HEMOGLOBIN: 11.6 g/dL — AB (ref 13.0–17.0)
LYMPHS ABS: 0.5 10*3/uL — AB (ref 0.7–4.0)
Lymphocytes Relative: 10 %
MCH: 32.1 pg (ref 26.0–34.0)
MCHC: 33.8 g/dL (ref 30.0–36.0)
MCV: 95 fL (ref 78.0–100.0)
MONOS PCT: 11 %
Monocytes Absolute: 0.6 10*3/uL (ref 0.1–1.0)
NEUTROS ABS: 4 10*3/uL (ref 1.7–7.7)
NEUTROS PCT: 77 %
Platelets: 89 10*3/uL — ABNORMAL LOW (ref 150–400)
RBC: 3.61 MIL/uL — AB (ref 4.22–5.81)
RDW: 15.2 % (ref 11.5–15.5)
WBC: 5.3 10*3/uL (ref 4.0–10.5)

## 2016-10-26 MED ORDER — PALONOSETRON HCL INJECTION 0.25 MG/5ML
0.2500 mg | Freq: Once | INTRAVENOUS | Status: AC
  Administered 2016-10-26: 0.25 mg via INTRAVENOUS

## 2016-10-26 MED ORDER — HEPARIN SOD (PORK) LOCK FLUSH 100 UNIT/ML IV SOLN
INTRAVENOUS | Status: AC
  Filled 2016-10-26: qty 5

## 2016-10-26 MED ORDER — HEPARIN SOD (PORK) LOCK FLUSH 100 UNIT/ML IV SOLN
500.0000 [IU] | Freq: Once | INTRAVENOUS | Status: AC | PRN
  Administered 2016-10-26: 500 [IU]

## 2016-10-26 MED ORDER — DIPHENHYDRAMINE HCL 25 MG PO CAPS
50.0000 mg | ORAL_CAPSULE | Freq: Once | ORAL | Status: AC
Start: 2016-10-26 — End: 2016-10-26
  Administered 2016-10-26: 50 mg via ORAL

## 2016-10-26 MED ORDER — PALONOSETRON HCL INJECTION 0.25 MG/5ML
INTRAVENOUS | Status: AC
  Filled 2016-10-26: qty 5

## 2016-10-26 MED ORDER — DEXAMETHASONE SODIUM PHOSPHATE 10 MG/ML IJ SOLN
INTRAMUSCULAR | Status: AC
  Filled 2016-10-26: qty 1

## 2016-10-26 MED ORDER — SODIUM CHLORIDE 0.9% FLUSH: 10.0000 mL | INTRAVENOUS | Status: DC | PRN

## 2016-10-26 MED ORDER — DIPHENHYDRAMINE HCL 25 MG PO CAPS
ORAL_CAPSULE | ORAL | Status: AC
  Filled 2016-10-26: qty 1

## 2016-10-26 MED ORDER — ACETAMINOPHEN 325 MG PO TABS
650.0000 mg | ORAL_TABLET | Freq: Once | ORAL | Status: AC
  Administered 2016-10-26: 650 mg via ORAL

## 2016-10-26 MED ORDER — DEXAMETHASONE SODIUM PHOSPHATE 10 MG/ML IJ SOLN
10.0000 mg | Freq: Once | INTRAMUSCULAR | Status: AC
  Administered 2016-10-26: 10 mg via INTRAVENOUS

## 2016-10-26 MED ORDER — SODIUM CHLORIDE 0.9 % IV SOLN
375.0000 mg/m2 | Freq: Once | INTRAVENOUS | Status: AC
  Administered 2016-10-26: 700 mg via INTRAVENOUS
  Filled 2016-10-26: qty 20

## 2016-10-26 MED ORDER — SODIUM CHLORIDE 0.9 % IV SOLN
90.0000 mg/m2 | Freq: Once | INTRAVENOUS | Status: AC
  Administered 2016-10-26: 175 mg via INTRAVENOUS
  Filled 2016-10-26: qty 4

## 2016-10-26 MED ORDER — SODIUM CHLORIDE 0.9 % IV SOLN
Freq: Once | INTRAVENOUS | Status: AC
  Administered 2016-10-26: 11:00:00 via INTRAVENOUS

## 2016-10-26 NOTE — Progress Notes (Signed)
Dr.  Muse reviewed labs and is aware of platelet count of 89,000, ok to treat per MD.  Patient tolerated infusion well.  VSS.  Patient ambulatory and stable upon discharge from clinic.

## 2016-10-26 NOTE — Progress Notes (Signed)
HEMATOLOGY/ONCOLOGY PROGRESS NOTE  Date of Service: 10/26/2016  Patient Care Team: Octavio Graves, DO as PCP - General  CHIEF COMPLAINTS:  NHL Pancytopenia BMBX on 05/06/2016 with extensive involvement by NHL B cell, admixed plasma cell component to a lesser extent which also shows kappa light chain restriction. DDX lymphoplasmacytic lymphoma, marginal zone lymphoma, follicular lymphoma with plasma cell differentiation, splenic lymphoma. Low grade process is favored.  HISTORY OF PRESENTING ILLNESS:  Paul Keith is a wonderful 82 y.o. male who  Is here for a follow-up of NHL vs lymphoplasmacytic lymphoma.   He is accompanied by his wife today and wll be receiving cycle 3 of Rituximab/Bendamustine.  Patient has been feeling well and reports normal appetite. He is however not gaining weight.   He experiences urinary incontinence. Paul Keith say his stream is strong and steady; however, minutes after he finishes urinating, he feels the urge to empty his bladder again. He takes Flomax every night. He is up every night at least 5 times.   Paul Keith reports improvement in strength. His last treatment went better than his first treatment. He did not experience the pain and fatigue. He notes overall improvement in his well being. No fever. No abdominal pain. No night sweats.    MEDICAL HISTORY:  Past Medical History:  Diagnosis Date  . Anemia 04/22/2016  . Diverticulosis    pt reports recent admission for diverticulosis perforation this past week  . 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 Pancytopenia ([place]) 04/22/2016  BPH L4-L5 fracture 6 weeks ago Vasomotor rhinitis for several years.  SURGICAL HISTORY: Past Surgical History:  Procedure Laterality Date  . CATARACT EXTRACTION Bilateral   . TONSILLECTOMY      SOCIAL HISTORY: Social History   Social History  . Marital status: Married    Spouse name: N/A  . Number  of children: N/A  . Years of education: N/A   Occupational History  . Not on file.   Social History Main Topics  . Smoking status: Former Smoker    Quit date: 09/15/1962  . Smokeless tobacco: Never Used  . Alcohol use Yes     Comment: OCCASSIONAL  . Drug use: No  . Sexual activity: Not on file     Comment: married   Other Topics Concern  . Not on file   Social History Narrative  . No narrative on file    FAMILY HISTORY: History reviewed. No pertinent family history.  ALLERGIES:  has No Known Allergies.  MEDICATIONS:  Current Outpatient Prescriptions  Medication Sig Dispense Refill  . acetaminophen (TYLENOL) 500 MG tablet Take 500 mg by mouth every 6 (six) hours as needed for mild pain.    [name] Kitchen acyclovir (ZOVIRAX) 400 MG tablet Take 1 tablet (400 mg total) by mouth daily. 30 tablet 3  . allopurinol (ZYLOPRIM) 300 MG tablet Take 1 tablet (300 mg total) by mouth 2 (two) times daily. 60 tablet 1  . B Complex-C (B-COMPLEX WITH VITAMIN C) tablet Take 1 tablet by mouth daily.    . bendamustine in sodium chloride 0.9 % 50 mL Inject into the vein once. Every 28 days    . cholecalciferol (VITAMIN D) 1000 units tablet Take 1,000 Units by mouth daily.    . cyanocobalamin 100 MCG tablet Take by mouth daily. Pt reports unsure of dosage, takes once daily    . dexamethasone (DECADRON) 4 MG tablet Take 2 tablets (8 mg total) by mouth daily. Start the day after  bendamustine chemotherapy for 2 days. Take with food. 30 tablet 1  . furosemide (LASIX) 20 MG tablet Take 20 mg by mouth daily.    . meclizine (ANTIVERT) 32 MG tablet Take 1 tablet (32 mg total) by mouth 3 (three) times daily as needed. 30 tablet 1  . Multiple Vitamin (MULTIVITAMIN WITH MINERALS) TABS tablet Take 1 tablet by mouth daily.    . ondansetron (ZOFRAN) 8 MG tablet Take 1 tablet (8 mg total) by mouth 2 (two) times daily as needed for nausea or vomiting. Start on day 2 after bendamustine. 30 tablet 1  . prochlorperazine  (COMPAZINE) 10 MG tablet Take 1 tablet (10 mg total) by mouth every 6 (six) hours as needed (Nausea or vomiting). 30 tablet 1  . RiTUXimab (RITUXAN IV) Inject into the vein.    . tamsulosin (FLOMAX) 0.4 MG CAPS capsule Take 0.8 mg by mouth daily after supper.     . traMADol (ULTRAM) 50 MG tablet Take 1 tablet (50 mg total) by mouth every 6 (six) hours as needed. 20 tablet 0   No current facility-administered medications for this visit.     REVIEW OF SYSTEMS:   Review of Systems  Constitutional: Positive for malaise/fatigue.  HENT: Negative.   Eyes: Negative.   Respiratory: Negative.   Cardiovascular: Negative.   Gastrointestinal: Negative.   Genitourinary: Positive for frequency.  Musculoskeletal: Negative.   Skin: Negative.   Neurological: Negative.   Endo/Heme/Allergies: Negative.   All other systems reviewed and are negative. 14 point review of systems was performed and is negative except as detailed under history of present illness and above   PHYSICAL EXAMINATION: ECOG PERFORMANCE STATUS: 1 - Symptomatic but completely ambulatory  Vitals with BMI 10/26/2016  Height   Weight 156 lbs 13 oz  BMI   Systolic 572  Diastolic 72  Pulse 620  Respirations 16   Physical Exam  Constitutional: He is oriented to person, place, and time and well-developed, well-nourished, and in no distress.  HENT:  Head: Normocephalic and atraumatic.  Nose: Nose normal.  Mouth/Throat: Oropharynx is clear and moist. No oropharyngeal exudate.  Eyes: Conjunctivae and EOM are normal. Pupils are equal, round, and reactive to light. Right eye exhibits no discharge. Left eye exhibits no discharge. No scleral icterus.  Neck: Normal range of motion. Neck supple. No tracheal deviation present. No thyromegaly present.  Cardiovascular: Normal rate, regular rhythm and normal heart sounds.  Exam reveals no gallop and no friction rub.   No murmur heard. Pulmonary/Chest: Effort normal and breath sounds normal.  He has no wheezes. He has no rales.  Abdominal: Soft. Bowel sounds are normal. He exhibits no distension and no mass. There is no tenderness. There is no rebound and no guarding.  Musculoskeletal: Normal range of motion. He exhibits no edema.  Lymphadenopathy:    He has no cervical adenopathy.  Neurological: He is alert and oriented to person, place, and time. He has normal reflexes. No cranial nerve deficit. Gait normal. Coordination normal.  Skin: Skin is warm and dry. No rash noted.  Psychiatric: Mood, memory, affect and judgment normal.  Nursing note and vitals reviewed.   LABORATORY DATA:  I have reviewed the data as listed   Results for Paul, Keith (MRN [id number]) as of 10/26/2016 11:11  Ref. Range 10/26/2016 09:17  Sodium Latest Ref Range: 135 - 145 mmol/L 136  Potassium Latest Ref Range: 3.5 - 5.1 mmol/L 4.1  Chloride Latest Ref Range: 101 - 111 mmol/L 104  CO2 Latest  Ref Range: 22 - 32 mmol/L 26  BUN Latest Ref Range: 6 - 20 mg/dL 18  Creatinine Latest Ref Range: 0.61 - 1.24 mg/dL 1.15  Calcium Latest Ref Range: 8.9 - 10.3 mg/dL 9.1  EGFR (Non-African Amer.) Latest Ref Range: >60 mL/min 57 (L)  EGFR (African American) Latest Ref Range: >60 mL/min >60  Glucose Latest Ref Range: 65 - 99 mg/dL 104 (H)  Anion gap Latest Ref Range: 5 - 15  6  Alkaline Phosphatase Latest Ref Range: 38 - 126 U/L 66  Albumin Latest Ref Range: 3.5 - 5.0 g/dL 3.9  AST Latest Ref Range: 15 - 41 U/L 20  ALT Latest Ref Range: 17 - 63 U/L 18  Total Protein Latest Ref Range: 6.5 - 8.1 g/dL 5.9 (L)  Total Bilirubin Latest Ref Range: 0.3 - 1.2 mg/dL 0.7  WBC Latest Ref Range: 4.0 - 10.5 K/uL 5.3  RBC Latest Ref Range: 4.22 - 5.81 MIL/uL 3.61 (L)  Hemoglobin Latest Ref Range: 13.0 - 17.0 g/dL 11.6 (L)  HCT Latest Ref Range: 39.0 - 52.0 % 34.3 (L)  MCV Latest Ref Range: 78.0 - 100.0 fL 95.0  MCH Latest Ref Range: 26.0 - 34.0 pg 32.1  MCHC Latest Ref Range: 30.0 - 36.0 g/dL 33.8  RDW Latest Ref  Range: 11.5 - 15.5 % 15.2  Platelets Latest Ref Range: 150 - 400 K/uL PENDING  Neutrophils Latest Units: % 77  Lymphocytes Latest Units: % 10  Monocytes Relative Latest Units: % 11  Eosinophil Latest Units: % 2  Basophil Latest Units: % 0  NEUT# Latest Ref Range: 1.7 - 7.7 K/uL 4.0  Lymphocyte # Latest Ref Range: 0.7 - 4.0 K/uL 0.5 (L)  Monocyte # Latest Ref Range: 0.1 - 1.0 K/uL 0.6  Eosinophils Absolute Latest Ref Range: 0.0 - 0.7 K/uL 0.1  Basophils Absolute Latest Ref Range: 0.0 - 0.1 K/uL 0.0       RADIOGRAPHIC STUDIES: I have personally reviewed the radiological images as listed and agreed with the findings in the report. No results found. Study Result   CLINICAL DATA:  Bright red blood per rectum with rectal pain for 3 days. History of diverticulitis. History of lymphoma.  EXAM: CT ABDOMEN AND PELVIS WITH CONTRAST  TECHNIQUE: Multidetector CT imaging of the abdomen and pelvis was performed using the standard protocol following bolus administration of intravenous contrast.  CONTRAST:  178m ISOVUE-300 IOPAMIDOL (ISOVUE-300) INJECTION 61%  COMPARISON:  08/16/2016  FINDINGS: Lower chest: No acute abnormality.  Hepatobiliary: No focal liver abnormality is seen. No gallstones, gallbladder wall thickening, or biliary dilatation.  Pancreas: Unremarkable. No pancreatic ductal dilatation or surrounding inflammatory changes.  Spleen: Enlarged measuring 17.9 x 7.2 x 15.6 cm, similar to the prior study. No splenic mass or focal lesion.  Adrenals/Urinary Tract: Adrenal glands are unremarkable. Kidneys are normal, without renal calculi, focal lesion, or hydronephrosis. Bladder is unremarkable.  Stomach/Bowel: Stomach and small bowel unremarkable. There are sigmoid colon diverticula. Area of previously seen diverticulitis has essentially resolved. There is no current evidence of active diverticulitis. No colonic wall thickening or inflammation.  Normal appendix visualized.  Vascular/Lymphatic: There is a focal irregular water attenuation lesion along the left margin at the root of the small bowel mesentery, measuring 6.1 x 3.6 x 4.9 cm. There is a similar-appearing low-attenuation area that tracks along the right external and internal iliac vessels inferiorly and posteriorly to the lying anterior to the lower sigmoid colon and upper rectum. These focal areas of fluid are similar to  the prior CT. There are no discrete enlarged lymph nodes. Mild atherosclerotic calcifications are noted along a normal caliber abdominal aorta.  Reproductive: Prostate is mildly enlarged stable.  Other: No abdominal wall hernia.  Musculoskeletal: Compression fractures of L4 and L5 stable from the prior CT. No other fractures. No osteoblastic or osteolytic lesions.  IMPRESSION: 1. Sigmoid colon diverticulitis seen on the prior exams has essentially resolved. No new areas of colonic inflammation. 2. Small amount nonspecific fluid tracks along the right iliac vessels into the posterior pelvis, stable from the prior exam. There is another small fluid collection in the central small bowel mesentery, also stable. 3. No acute findings in the abdomen or pelvis. 4. Stable splenomegaly.   Electronically Signed   By: Lajean Manes M.D.   On: 08/25/2016 16:48       ASSESSMENT & PLAN:  NHL, low grade, heavy marrow involvement Pancytopenia with neutropenia IgM Kappa monoclonal gammopathy L4-L5 vertebral fracture Splenomegaly secondary to NHL Diffuse osseous uptake on PET secondary to NHL Diverticulitis/diverticular abscess Urinary frequency  82 year old Caucasian male with good performance status with minimal chronic medical comorbidities with  #1 Pancytopenia with marrow showing lymphocytic infiltration concerning for lymphoproliferative process. LPL and MCL in the top differentials. He received multiple cycles of Rituxan but with  little improvement in counts. Bendamustine has been added with good tolerance. Counts are improving. Energy is improving. Will move forward with therapy today. He continues to inquire about number of cycles and I continue to advise him at least 4 to 6 cycles.  #2 IgM Kappa monoclonal gammopathy with preliminary bone marrow picture showing lymphocytic infiltration concerning for Waldenstrom's Macroglobulinemia/lymphoplasmacytic lymphoma.As treatment progresses will continue to monitor. Have added SPEP and IEP today.   Torin reports urinary incontinence. We previously conducted a UA which was normal. I have referred him to urologist, Dr. Alyson Ingles. He is on flomax.   All of the patients and his family's questions were answered in details to their apparent satisfaction. The patient knows to call the clinic with any problems, questions or concerns.   Orders Placed This Encounter  Procedures  . Protein electrophoresis, serum    Standing Status:   Future    Standing Expiration Date:   10/26/2017  . Immunofixation electrophoresis    Standing Status:   Future    Standing Expiration Date:   10/26/2017  . IgG, IgA, IgM    Standing Status:   Future    Standing Expiration Date:   10/26/2017  . CBC with Differential    Standing Status:   Future    Standing Expiration Date:   10/26/2017  . Comprehensive metabolic panel    Standing Status:   Future    Standing Expiration Date:   10/26/2017  . Protein electrophoresis, serum    Standing Status:   Future    Standing Expiration Date:   10/26/2017  . Immunofixation electrophoresis    Standing Status:   Future    Standing Expiration Date:   10/26/2017   This document serves as a record of services personally performed by Ancil Linsey, MD. It was created on her behalf by Elmyra Ricks, a trained medical scribe. The creation of this record is based on the scribe's personal observations and the provider's statements to them. This document has been checked  and approved by the attending provider.  I have reviewed the above documentation for accuracy and completeness, and I agree with the above.  Kelby Fam. Amori Cooperman, MD  09/14/2016 8:19 AM

## 2016-10-26 NOTE — Patient Instructions (Signed)
[facility] Discharge Instructions for Patients Receiving Chemotherapy   Beginning January 23rd 2017 lab work for the [facility] will be done in the  Main lab at [facility] on 1st floor. If you have a lab appointment with the [place] please come in thru the  Main Entrance and check in at the main information desk   Today you received the following chemotherapy agents: Rituxan and Bendeka.     If you develop nausea and vomiting, or diarrhea that is not controlled by your medication, call the clinic.  The clinic phone number is [phone]. Office hours are Monday-Friday 8:30am-5:00pm.  BELOW ARE SYMPTOMS THAT SHOULD BE REPORTED IMMEDIATELY:  *FEVER GREATER THAN 101.0 F  *CHILLS WITH OR WITHOUT FEVER  NAUSEA AND VOMITING THAT IS NOT CONTROLLED WITH YOUR NAUSEA MEDICATION  *UNUSUAL SHORTNESS OF BREATH  *UNUSUAL BRUISING OR BLEEDING  TENDERNESS IN MOUTH AND THROAT WITH OR WITHOUT PRESENCE OF ULCERS  *URINARY PROBLEMS  *BOWEL PROBLEMS  UNUSUAL RASH Items with * indicate a potential emergency and should be followed up as soon as possible. If you have an emergency after office hours please contact your primary care physician or go to the nearest emergency department.  Please call the clinic during office hours if you have any questions or concerns.   You may also contact the Patient Navigator at [phone] should you have any questions or need assistance in obtaining follow up care.      Resources For Cancer Patients and their Caregivers ? American Cancer Society: Can assist with transportation, wigs, general needs, runs Look Good Feel Better.        [phone] ? Cancer Care: Provides financial assistance, online support groups, medication/co-pay assistance.  1-800-813-HOPE 678-025-2185) ? [place] Assists [place] cancer patients and their families through emotional , educational and financial support.   908-321-5881 ? [place] DSS Where to apply for food stamps, Medicaid and utility assistance. (779) [phone] ? [employer]: Transportation to medical appointments. ([phone] ? Social Security Administration: May apply for disability if have a Stage IV cancer. [phone] ? [employer], Disability and Transit Services: Assists with nutrition, care and transit needs. [phone]

## 2016-10-26 NOTE — Progress Notes (Unsigned)
Referral sent to [facility].  Records faxed on 12/12

## 2016-10-26 NOTE — Patient Instructions (Addendum)
[place] at [facility] Discharge Instructions  RECOMMENDATIONS MADE BY THE CONSULTANT AND ANY TEST RESULTS WILL BE SENT TO YOUR REFERRING PHYSICIAN.  You saw Dr.Penland today. Follow up with MD at next chemo cycle with labs. Refer to Dr. Alyson Ingles for frequent urination See Amy at checkout for appointments.  Thank you for choosing [place] at [facility] to provide your oncology and hematology care.  To afford each patient quality time with our provider, please arrive at least 15 minutes before your scheduled appointment time.   Beginning January 23rd 2017 lab work for the [name] will be done in the  Main lab at [facility] on 1st floor. If you have a lab appointment with the [place] please come in thru the  Main Entrance and check in at the main information desk  You need to re-schedule your appointment should you arrive 10 or more minutes late.  We strive to give you quality time with our providers, and arriving late affects you and other patients whose appointments are after yours.  Also, if you no show three or more times for appointments you may be dismissed from the clinic at the providers discretion.     Again, thank you for choosing [facility].  Our hope is that these requests will decrease the amount of time that you wait before being seen by our physicians.       _____________________________________________________________  Should you have questions after your visit to [facility], please contact our office at [phone] between the hours of 8:30 a.m. and 4:30 p.m.  Voicemails left after 4:30 p.m. will not be returned until the following business day.  For prescription refill requests, have your pharmacy contact our office.         Resources For Cancer Patients and their Caregivers ? American Cancer Society: Can assist with transportation, wigs, general needs, runs Look Good Feel  Better.        512-738-4257 ? Cancer Care: Provides financial assistance, online support groups, medication/co-pay assistance.  1-800-813-HOPE 605-424-2367) ? [place] Assists [place] cancer patients and their families through emotional , educational and financial support.  220 650 6585 ? [place] DSS Where to apply for food stamps, Medicaid and utility assistance. 4193593625 ? [employer]: Transportation to medical appointments. 587-780-9826 ? Social Security Administration: May apply for disability if have a Stage IV cancer. (906) [phone] ? [employer], Disability and Transit Services: Assists with nutrition, care and transit needs. [place] Support Programs: @10RELATIVEDAYS @ > Cancer Support Group  2nd Tuesday of the month 1pm-2pm, Journey Room  > Creative Journey  3rd Tuesday of the month 1130am-1pm, Journey Room  > Look Good Feel Better  1st Wednesday of the month 10am-12 noon, Journey Room (Call [place] to register [phone])

## 2016-10-27 ENCOUNTER — Encounter (HOSPITAL_BASED_OUTPATIENT_CLINIC_OR_DEPARTMENT_OTHER): Payer: Medicare Other

## 2016-10-27 VITALS — BP 115/53 | HR 83 | Temp 98.3°F | Resp 18

## 2016-10-27 DIAGNOSIS — D472 Monoclonal gammopathy: Secondary | ICD-10-CM

## 2016-10-27 DIAGNOSIS — Z5111 Encounter for antineoplastic chemotherapy: Secondary | ICD-10-CM

## 2016-10-27 DIAGNOSIS — C8300 Small cell B-cell lymphoma, unspecified site: Secondary | ICD-10-CM | POA: Diagnosis not present

## 2016-10-27 DIAGNOSIS — C880 Waldenstrom macroglobulinemia: Secondary | ICD-10-CM

## 2016-10-27 LAB — IMMUNOFIXATION ELECTROPHORESIS
IGG (IMMUNOGLOBIN G), SERUM: 351 mg/dL — AB (ref 700–1600)
IgA: 81 mg/dL (ref 61–437)
IgM, Serum: 164 mg/dL — ABNORMAL HIGH (ref 15–143)
Total Protein ELP: 5.5 g/dL — ABNORMAL LOW (ref 6.0–8.5)

## 2016-10-27 LAB — IGG, IGA, IGM
IGA: 81 mg/dL (ref 61–437)
IgG (Immunoglobin G), Serum: 377 mg/dL — ABNORMAL LOW (ref 700–1600)
IgM, Serum: 165 mg/dL — ABNORMAL HIGH (ref 15–143)

## 2016-10-27 MED ORDER — DEXAMETHASONE SODIUM PHOSPHATE 10 MG/ML IJ SOLN
INTRAMUSCULAR | Status: AC
  Filled 2016-10-27: qty 1

## 2016-10-27 MED ORDER — DEXAMETHASONE SODIUM PHOSPHATE 10 MG/ML IJ SOLN
10.0000 mg | Freq: Once | INTRAMUSCULAR | Status: AC
  Administered 2016-10-27: 10 mg via INTRAVENOUS

## 2016-10-27 MED ORDER — SODIUM CHLORIDE 0.9 % IV SOLN
Freq: Once | INTRAVENOUS | Status: AC
  Administered 2016-10-27: 14:00:00 via INTRAVENOUS

## 2016-10-27 MED ORDER — PEGFILGRASTIM 6 MG/0.6ML ~~LOC~~ PSKT
6.0000 mg | PREFILLED_SYRINGE | Freq: Once | SUBCUTANEOUS | Status: AC
  Administered 2016-10-27: 6 mg via SUBCUTANEOUS

## 2016-10-27 MED ORDER — HEPARIN SOD (PORK) LOCK FLUSH 100 UNIT/ML IV SOLN
500.0000 [IU] | Freq: Once | INTRAVENOUS | Status: AC | PRN
  Administered 2016-10-27: 500 [IU]
  Filled 2016-10-27 (×2): qty 5

## 2016-10-27 MED ORDER — PEGFILGRASTIM 6 MG/0.6ML ~~LOC~~ PSKT
PREFILLED_SYRINGE | SUBCUTANEOUS | Status: AC
  Filled 2016-10-27: qty 0.6

## 2016-10-27 MED ORDER — SODIUM CHLORIDE 0.9 % IV SOLN
90.0000 mg/m2 | Freq: Once | INTRAVENOUS | Status: AC
  Administered 2016-10-27: 175 mg via INTRAVENOUS
  Filled 2016-10-27: qty 4

## 2016-10-27 MED ORDER — SODIUM CHLORIDE 0.9% FLUSH
10.0000 mL | INTRAVENOUS | Status: DC | PRN
  Administered 2016-10-27: 10 mL
  Filled 2016-10-27: qty 10

## 2016-10-27 NOTE — Patient Instructions (Addendum)
[facility] Discharge Instructions for Patients Receiving Chemotherapy   Beginning January 23rd 2017 lab work for the [facility] will be done in the  Main lab at [facility] on 1st floor. If you have a lab appointment with the [place] please come in thru the  Main Entrance and check in at the main information desk   Today you received the following chemotherapy agents Bendamustine as well as Neulasta on-pro. Follow-up as scheduled. Call clinic for any questions or concerns  To help prevent nausea and vomiting after your treatment, we encourage you to take your nausea medication   If you develop nausea and vomiting, or diarrhea that is not controlled by your medication, call the clinic.  The clinic phone number is [phone]. Office hours are Monday-Friday 8:30am-5:00pm.  BELOW ARE SYMPTOMS THAT SHOULD BE REPORTED IMMEDIATELY:  *FEVER GREATER THAN 101.0 F  *CHILLS WITH OR WITHOUT FEVER  NAUSEA AND VOMITING THAT IS NOT CONTROLLED WITH YOUR NAUSEA MEDICATION  *UNUSUAL SHORTNESS OF BREATH  *UNUSUAL BRUISING OR BLEEDING  TENDERNESS IN MOUTH AND THROAT WITH OR WITHOUT PRESENCE OF ULCERS  *URINARY PROBLEMS  *BOWEL PROBLEMS  UNUSUAL RASH Items with * indicate a potential emergency and should be followed up as soon as possible. If you have an emergency after office hours please contact your primary care physician or go to the nearest emergency department.  Please call the clinic during office hours if you have any questions or concerns.   You may also contact the Patient Navigator at [phone] should you have any questions or need assistance in obtaining follow up care.      Resources For Cancer Patients and their Caregivers ? American Cancer Society: Can assist with transportation, wigs, general needs, runs Look Good Feel Better.        [phone] ? Cancer Care: Provides financial assistance, online support groups, medication/co-pay  assistance.  1-800-813-HOPE 386-862-8453) ? [place] Assists [place] cancer patients and their families through emotional , educational and financial support.  210-183-5896 ? [place] DSS Where to apply for food stamps, Medicaid and utility assistance. ([phone] ? RCATS: Transportation to medical appointments. [phone] ? Social Security Administration: May apply for disability if have a Stage IV cancer. [phone] [phone] ? [employer], Disability and Transit Services: Assists with nutrition, care and transit needs. [phone]

## 2016-10-27 NOTE — Progress Notes (Signed)
Paul Keith tolerated chemo tx and Neulasta on-pro well without complaints or incident. VSS upon discharge. Neulasta on-pro applied to right arm with green indicator light flashing. Pt discharged self ambulatory in satisfactory condition

## 2016-10-28 LAB — PROTEIN ELECTROPHORESIS, SERUM
A/G Ratio: 1.8 — ABNORMAL HIGH (ref 0.7–1.7)
ALPHA-1-GLOBULIN: 0.2 g/dL (ref 0.0–0.4)
ALPHA-2-GLOBULIN: 0.5 g/dL (ref 0.4–1.0)
Albumin ELP: 3.4 g/dL (ref 2.9–4.4)
BETA GLOBULIN: 0.7 g/dL (ref 0.7–1.3)
Gamma Globulin: 0.5 g/dL (ref 0.4–1.8)
Globulin, Total: 1.9 g/dL — ABNORMAL LOW (ref 2.2–3.9)
M-SPIKE, %: 0.2 g/dL — AB
Total Protein ELP: 5.3 g/dL — ABNORMAL LOW (ref 6.0–8.5)

## 2016-11-05 ENCOUNTER — Encounter (HOSPITAL_COMMUNITY): Payer: Medicare Other

## 2016-11-05 DIAGNOSIS — C880 Waldenstrom macroglobulinemia: Secondary | ICD-10-CM

## 2016-11-05 DIAGNOSIS — D472 Monoclonal gammopathy: Secondary | ICD-10-CM

## 2016-11-05 DIAGNOSIS — D61818 Other pancytopenia: Secondary | ICD-10-CM | POA: Diagnosis not present

## 2016-11-05 LAB — COMPREHENSIVE METABOLIC PANEL
ALT: 16 U/L — AB (ref 17–63)
AST: 24 U/L (ref 15–41)
Albumin: 3.8 g/dL (ref 3.5–5.0)
Alkaline Phosphatase: 82 U/L (ref 38–126)
Anion gap: 7 (ref 5–15)
BILIRUBIN TOTAL: 0.7 mg/dL (ref 0.3–1.2)
BUN: 17 mg/dL (ref 6–20)
CHLORIDE: 102 mmol/L (ref 101–111)
CO2: 27 mmol/L (ref 22–32)
CREATININE: 1.2 mg/dL (ref 0.61–1.24)
Calcium: 9 mg/dL (ref 8.9–10.3)
GFR, EST NON AFRICAN AMERICAN: 54 mL/min — AB (ref >60)
Glucose, Bld: 102 mg/dL — ABNORMAL HIGH (ref 65–99)
POTASSIUM: 3.8 mmol/L (ref 3.5–5.1)
Sodium: 136 mmol/L (ref 135–145)
TOTAL PROTEIN: 5.9 g/dL — AB (ref 6.5–8.1)

## 2016-11-05 LAB — CBC WITH DIFFERENTIAL/PLATELET
BASOS ABS: 0 10*3/uL (ref 0.0–0.1)
BASOS PCT: 0 %
EOS ABS: 0 10*3/uL (ref 0.0–0.7)
Eosinophils Relative: 1 %
HEMATOCRIT: 36 % — AB (ref 39.0–52.0)
HEMOGLOBIN: 12.1 g/dL — AB (ref 13.0–17.0)
LYMPHS PCT: 8 %
Lymphs Abs: 0.4 10*3/uL — ABNORMAL LOW (ref 0.7–4.0)
MCH: 32.4 pg (ref 26.0–34.0)
MCHC: 33.6 g/dL (ref 30.0–36.0)
MCV: 96.5 fL (ref 78.0–100.0)
Monocytes Absolute: 0.3 10*3/uL (ref 0.1–1.0)
Monocytes Relative: 7 %
NEUTROS PCT: 84 %
Neutro Abs: 4 10*3/uL (ref 1.7–7.7)
Platelets: 67 10*3/uL — ABNORMAL LOW (ref 150–400)
RBC: 3.73 MIL/uL — ABNORMAL LOW (ref 4.22–5.81)
RDW: 15.5 % (ref 11.5–15.5)
WBC: 4.7 10*3/uL (ref 4.0–10.5)

## 2016-11-11 ENCOUNTER — Other Ambulatory Visit (HOSPITAL_COMMUNITY): Payer: Self-pay | Admitting: Emergency Medicine

## 2016-11-23 ENCOUNTER — Encounter (HOSPITAL_COMMUNITY): Payer: Medicare Other

## 2016-11-23 ENCOUNTER — Encounter (HOSPITAL_COMMUNITY): Payer: Medicare Other | Attending: Hematology & Oncology | Admitting: Hematology & Oncology

## 2016-11-23 ENCOUNTER — Encounter (HOSPITAL_COMMUNITY): Payer: Self-pay | Admitting: Hematology & Oncology

## 2016-11-23 VITALS — BP 116/72 | HR 107 | Temp 97.4°F | Resp 18 | Wt 157.6 lb

## 2016-11-23 DIAGNOSIS — D479 Neoplasm of uncertain behavior of lymphoid, hematopoietic and related tissue, unspecified: Secondary | ICD-10-CM

## 2016-11-23 DIAGNOSIS — C8300 Small cell B-cell lymphoma, unspecified site: Secondary | ICD-10-CM | POA: Diagnosis not present

## 2016-11-23 DIAGNOSIS — D709 Neutropenia, unspecified: Secondary | ICD-10-CM | POA: Diagnosis not present

## 2016-11-23 DIAGNOSIS — D61818 Other pancytopenia: Secondary | ICD-10-CM

## 2016-11-23 DIAGNOSIS — R5383 Other fatigue: Secondary | ICD-10-CM | POA: Diagnosis not present

## 2016-11-23 DIAGNOSIS — D472 Monoclonal gammopathy: Secondary | ICD-10-CM | POA: Diagnosis not present

## 2016-11-23 DIAGNOSIS — D696 Thrombocytopenia, unspecified: Secondary | ICD-10-CM | POA: Diagnosis not present

## 2016-11-23 DIAGNOSIS — Z9889 Other specified postprocedural states: Secondary | ICD-10-CM | POA: Diagnosis not present

## 2016-11-23 DIAGNOSIS — C8800 Waldenstrom macroglobulinemia not having achieved remission: Secondary | ICD-10-CM

## 2016-11-23 DIAGNOSIS — D708 Other neutropenia: Secondary | ICD-10-CM

## 2016-11-23 DIAGNOSIS — D649 Anemia, unspecified: Secondary | ICD-10-CM | POA: Insufficient documentation

## 2016-11-23 DIAGNOSIS — Z87891 Personal history of nicotine dependence: Secondary | ICD-10-CM | POA: Diagnosis not present

## 2016-11-23 DIAGNOSIS — R32 Unspecified urinary incontinence: Secondary | ICD-10-CM

## 2016-11-23 DIAGNOSIS — Z79899 Other long term (current) drug therapy: Secondary | ICD-10-CM | POA: Diagnosis not present

## 2016-11-23 LAB — CBC WITH DIFFERENTIAL/PLATELET
BASOS ABS: 0 10*3/uL (ref 0.0–0.1)
Basophils Relative: 0 %
Eosinophils Absolute: 0 10*3/uL (ref 0.0–0.7)
Eosinophils Relative: 5 %
HCT: 31.7 % — ABNORMAL LOW (ref 39.0–52.0)
HEMOGLOBIN: 11.1 g/dL — AB (ref 13.0–17.0)
LYMPHS ABS: 0.6 10*3/uL — AB (ref 0.7–4.0)
Lymphocytes Relative: 53 %
MCH: 31.7 pg (ref 26.0–34.0)
MCHC: 35 g/dL (ref 30.0–36.0)
MCV: 90.6 fL (ref 78.0–100.0)
MONO ABS: 0.3 10*3/uL (ref 0.1–1.0)
Monocytes Relative: 38 %
NEUTROS PCT: 4 %
Neutro Abs: 0 10*3/uL — ABNORMAL LOW (ref 1.7–7.7)
PLATELETS: 122 10*3/uL — AB (ref 150–400)
RBC: 3.5 MIL/uL — AB (ref 4.22–5.81)
RDW: 13.9 % (ref 11.5–15.5)
WBC: 0.9 10*3/uL — AB (ref 4.0–10.5)

## 2016-11-23 LAB — COMPREHENSIVE METABOLIC PANEL
ALBUMIN: 3.7 g/dL (ref 3.5–5.0)
ALK PHOS: 67 U/L (ref 38–126)
ALT: 14 U/L — AB (ref 17–63)
AST: 19 U/L (ref 15–41)
Anion gap: 6 (ref 5–15)
BILIRUBIN TOTAL: 0.5 mg/dL (ref 0.3–1.2)
BUN: 18 mg/dL (ref 6–20)
CALCIUM: 9 mg/dL (ref 8.9–10.3)
CO2: 26 mmol/L (ref 22–32)
CREATININE: 1.22 mg/dL (ref 0.61–1.24)
Chloride: 102 mmol/L (ref 101–111)
GFR calc Af Amer: >60 mL/min (ref >60)
GFR calc non Af Amer: 53 mL/min — ABNORMAL LOW (ref >60)
GLUCOSE: 111 mg/dL — AB (ref 65–99)
Potassium: 4.2 mmol/L (ref 3.5–5.1)
SODIUM: 134 mmol/L — AB (ref 135–145)
TOTAL PROTEIN: 5.7 g/dL — AB (ref 6.5–8.1)

## 2016-11-23 MED ORDER — HEPARIN SOD (PORK) LOCK FLUSH 100 UNIT/ML IV SOLN
500.0000 [IU] | Freq: Once | INTRAVENOUS | Status: AC
  Administered 2016-11-23: 500 [IU] via INTRAVENOUS

## 2016-11-23 MED ORDER — HEPARIN SOD (PORK) LOCK FLUSH 100 UNIT/ML IV SOLN
INTRAVENOUS | Status: AC
  Filled 2016-11-23: qty 5

## 2016-11-23 MED ORDER — SODIUM CHLORIDE 0.9% FLUSH
10.0000 mL | INTRAVENOUS | Status: DC | PRN
  Administered 2016-11-23: 10 mL via INTRAVENOUS
  Filled 2016-11-23: qty 10

## 2016-11-23 NOTE — Progress Notes (Signed)
HEMATOLOGY/ONCOLOGY PROGRESS NOTE  Date of Service: 11/23/2016  Patient Care Team: Octavio Graves, DO as PCP - General  CHIEF COMPLAINTS:  NHL Pancytopenia BMBX on 05/06/2016 with extensive involvement by NHL B cell, admixed plasma cell component to a lesser extent which also shows kappa light chain restriction. DDX lymphoplasmacytic lymphoma, marginal zone lymphoma, follicular lymphoma with plasma cell differentiation, splenic lymphoma. Low grade process is favored.  HISTORY OF PRESENTING ILLNESS:  Paul Keith is a wonderful 83 y.o. male who is here for a follow-up of NHL vs lymphoplasmacytic lymphoma.   He is accompanied by his wife today and will be receiving cycle 4 of Rituximab/Bendamustine.  States he has a good appetite, is sleeping well, aside from having nocturia a couple times a night.    Wife says he is doing well, even the down days are better than they were previously.   He has a runny nose, says when he eats he has to get up to blow his nose 2-3 times. Doesn't take anything for allergies.  Denies SOB, chest pain, fever or chills. No further bowel complaints.   States legs are good strength wise.  Yesterday was a good day, he went outside feeding the birds, did house work, and was active. Compared to 6 months ago his good days feel a lot better now.  I personally reviewed and went over labs with the patient, including low WBC. He understands that therapy will be held. He received neulasta after his last cycle. Last given on 12/13. Weight is stable.   MEDICAL HISTORY:  Past Medical History:  Diagnosis Date  . Anemia 04/22/2016  . Diverticulosis    pt reports recent admission for diverticulosis perforation this past week  . 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 Pancytopenia ([place]) 04/22/2016  BPH L4-L5 fracture 6 weeks ago Vasomotor rhinitis for several years.  SURGICAL HISTORY: Past Surgical  History:  Procedure Laterality Date  . CATARACT EXTRACTION Bilateral   . TONSILLECTOMY      SOCIAL HISTORY: Social History   Social History  . Marital status: Married    Spouse name: N/A  . Number of children: N/A  . Years of education: N/A   Occupational History  . Not on file.   Social History Main Topics  . Smoking status: Former Smoker    Quit date: 09/15/1962  . Smokeless tobacco: Never Used  . Alcohol use Yes     Comment: OCCASSIONAL  . Drug use: No  . Sexual activity: Not on file     Comment: married   Other Topics Concern  . Not on file   Social History Narrative  . No narrative on file    FAMILY HISTORY: No family history on file.  ALLERGIES:  has No Known Allergies.  MEDICATIONS:  Current Outpatient Prescriptions  Medication Sig Dispense Refill  . acetaminophen (TYLENOL) 500 MG tablet Take 500 mg by mouth every 6 (six) hours as needed for mild pain.    [name] Kitchen acyclovir (ZOVIRAX) 400 MG tablet Take 1 tablet (400 mg total) by mouth daily. 30 tablet 3  . allopurinol (ZYLOPRIM) 300 MG tablet Take 1 tablet (300 mg total) by mouth 2 (two) times daily. 60 tablet 1  . B Complex-C (B-COMPLEX WITH VITAMIN C) tablet Take 1 tablet by mouth daily.    . bendamustine in sodium chloride 0.9 % 50 mL Inject into the vein once. Every 28 days    . cholecalciferol (VITAMIN D) 1000 units  tablet Take 1,000 Units by mouth daily.    . cyanocobalamin 100 MCG tablet Take by mouth daily. Pt reports unsure of dosage, takes once daily    . dexamethasone (DECADRON) 4 MG tablet Take 2 tablets (8 mg total) by mouth daily. Start the day after bendamustine chemotherapy for 2 days. Take with food. 30 tablet 1  . furosemide (LASIX) 20 MG tablet Take 20 mg by mouth daily.    . meclizine (ANTIVERT) 32 MG tablet Take 1 tablet (32 mg total) by mouth 3 (three) times daily as needed. 30 tablet 1  . Multiple Vitamin (MULTIVITAMIN WITH MINERALS) TABS tablet Take 1 tablet by mouth daily.    . ondansetron  (ZOFRAN) 8 MG tablet Take 1 tablet (8 mg total) by mouth 2 (two) times daily as needed for nausea or vomiting. Start on day 2 after bendamustine. 30 tablet 1  . prochlorperazine (COMPAZINE) 10 MG tablet Take 1 tablet (10 mg total) by mouth every 6 (six) hours as needed (Nausea or vomiting). 30 tablet 1  . RiTUXimab (RITUXAN IV) Inject into the vein.    . tamsulosin (FLOMAX) 0.4 MG CAPS capsule Take 0.8 mg by mouth daily after supper.     . traMADol (ULTRAM) 50 MG tablet Take 1 tablet (50 mg total) by mouth every 6 (six) hours as needed. 20 tablet 0   No current facility-administered medications for this visit.     REVIEW OF SYSTEMS:   Review of Systems  Constitutional: Negative.  Negative for fever.  HENT: Negative.        Rhinorrhea   Eyes: Negative.   Respiratory: Negative.  Negative for shortness of breath.   Cardiovascular: Negative.  Negative for chest pain.  Gastrointestinal: Negative.   Genitourinary:       Nocturia  Musculoskeletal: Negative.   Skin: Negative.   Neurological: Negative.   Endo/Heme/Allergies: Negative.   Psychiatric/Behavioral: Negative.   All other systems reviewed and are negative. 14 point review of systems was performed and is negative except as detailed under history of present illness and above   PHYSICAL EXAMINATION: ECOG PERFORMANCE STATUS: 1 - Symptomatic but completely ambulatory  Vitals with BMI 11/23/2016  Height   Weight 157 lbs 10 oz  BMI   Systolic 975  Diastolic 72  Pulse 883  Respirations 18    Physical Exam  Constitutional: He is oriented to person, place, and time and well-developed, well-nourished, and in no distress.  HENT:  Head: Normocephalic and atraumatic.  Nose: Nose normal.  Mouth/Throat: Oropharynx is clear and moist. No oropharyngeal exudate.  Eyes: Conjunctivae and EOM are normal. Pupils are equal, round, and reactive to light. Right eye exhibits no discharge. Left eye exhibits no discharge. No scleral icterus.  Neck:  Normal range of motion. Neck supple. No tracheal deviation present. No thyromegaly present.  Cardiovascular: Normal rate, regular rhythm and normal heart sounds.  Exam reveals no gallop and no friction rub.   No murmur heard. Pulmonary/Chest: Effort normal and breath sounds normal. He has no wheezes. He has no rales.  Abdominal: Soft. Bowel sounds are normal. He exhibits no distension and no mass. There is no tenderness. There is no rebound and no guarding.  Musculoskeletal: Normal range of motion. He exhibits no edema.  Lymphadenopathy:    He has no cervical adenopathy.  Neurological: He is alert and oriented to person, place, and time. He has normal reflexes. No cranial nerve deficit. Gait normal. Coordination normal.  Skin: Skin is warm and dry. No  rash noted.  Psychiatric: Mood, memory, affect and judgment normal.  Nursing note and vitals reviewed.   LABORATORY DATA:  I have reviewed the data as listed   Results for KEY, CEN (MRN [id number]) as of 11/23/2016 16:48  Ref. Range 11/23/2016 08:48  COMPREHENSIVE METABOLIC PANEL Unknown Rpt (A)  Sodium Latest Ref Range: 135 - 145 mmol/L 134 (L)  Potassium Latest Ref Range: 3.5 - 5.1 mmol/L 4.2  Chloride Latest Ref Range: 101 - 111 mmol/L 102  CO2 Latest Ref Range: 22 - 32 mmol/L 26  Glucose Latest Ref Range: 65 - 99 mg/dL 111 (H)  BUN Latest Ref Range: 6 - 20 mg/dL 18  Creatinine Latest Ref Range: 0.61 - 1.24 mg/dL 1.22  Calcium Latest Ref Range: 8.9 - 10.3 mg/dL 9.0  Anion gap Latest Ref Range: 5 - 15  6  Alkaline Phosphatase Latest Ref Range: 38 - 126 U/L 67  Albumin Latest Ref Range: 3.5 - 5.0 g/dL 3.7  AST Latest Ref Range: 15 - 41 U/L 19  ALT Latest Ref Range: 17 - 63 U/L 14 (L)  Total Protein Latest Ref Range: 6.5 - 8.1 g/dL 5.7 (L)  Total Bilirubin Latest Ref Range: 0.3 - 1.2 mg/dL 0.5  EGFR (African American) Latest Ref Range: >60 mL/min >60  EGFR (Non-African Amer.) Latest Ref Range: >60 mL/min 53 (L)  WBC Latest Ref  Range: 4.0 - 10.5 K/uL 0.9 (LL)  RBC Latest Ref Range: 4.22 - 5.81 MIL/uL 3.50 (L)  Hemoglobin Latest Ref Range: 13.0 - 17.0 g/dL 11.1 (L)  HCT Latest Ref Range: 39.0 - 52.0 % 31.7 (L)  MCV Latest Ref Range: 78.0 - 100.0 fL 90.6  MCH Latest Ref Range: 26.0 - 34.0 pg 31.7  MCHC Latest Ref Range: 30.0 - 36.0 g/dL 35.0  RDW Latest Ref Range: 11.5 - 15.5 % 13.9  Platelets Latest Ref Range: 150 - 400 K/uL 122 (L)  Neutrophils Latest Units: % 4  Lymphocytes Latest Units: % 53  Monocytes Relative Latest Units: % 38  Eosinophil Latest Units: % 5  Basophil Latest Units: % 0  NEUT# Latest Ref Range: 1.7 - 7.7 K/uL 0.0 (L)  Lymphocyte # Latest Ref Range: 0.7 - 4.0 K/uL 0.6 (L)  Monocyte # Latest Ref Range: 0.1 - 1.0 K/uL 0.3  Eosinophils Absolute Latest Ref Range: 0.0 - 0.7 K/uL 0.0  Basophils Absolute Latest Ref Range: 0.0 - 0.1 K/uL 0.0         RADIOGRAPHIC STUDIES: I have personally reviewed the radiological images as listed and agreed with the findings in the report. No results found. Study Result   CLINICAL DATA:  Bright red blood per rectum with rectal pain for 3 days. History of diverticulitis. History of lymphoma.  EXAM: CT ABDOMEN AND PELVIS WITH CONTRAST  TECHNIQUE: Multidetector CT imaging of the abdomen and pelvis was performed using the standard protocol following bolus administration of intravenous contrast.  CONTRAST:  147m ISOVUE-300 IOPAMIDOL (ISOVUE-300) INJECTION 61%  COMPARISON:  08/16/2016  FINDINGS: Lower chest: No acute abnormality.  Hepatobiliary: No focal liver abnormality is seen. No gallstones, gallbladder wall thickening, or biliary dilatation.  Pancreas: Unremarkable. No pancreatic ductal dilatation or surrounding inflammatory changes.  Spleen: Enlarged measuring 17.9 x 7.2 x 15.6 cm, similar to the prior study. No splenic mass or focal lesion.  Adrenals/Urinary Tract: Adrenal glands are unremarkable. Kidneys are normal, without  renal calculi, focal lesion, or hydronephrosis. Bladder is unremarkable.  Stomach/Bowel: Stomach and small bowel unremarkable. There are sigmoid colon diverticula. Area  of previously seen diverticulitis has essentially resolved. There is no current evidence of active diverticulitis. No colonic wall thickening or inflammation. Normal appendix visualized.  Vascular/Lymphatic: There is a focal irregular water attenuation lesion along the left margin at the root of the small bowel mesentery, measuring 6.1 x 3.6 x 4.9 cm. There is a similar-appearing low-attenuation area that tracks along the right external and internal iliac vessels inferiorly and posteriorly to the lying anterior to the lower sigmoid colon and upper rectum. These focal areas of fluid are similar to the prior CT. There are no discrete enlarged lymph nodes. Mild atherosclerotic calcifications are noted along a normal caliber abdominal aorta.  Reproductive: Prostate is mildly enlarged stable.  Other: No abdominal wall hernia.  Musculoskeletal: Compression fractures of L4 and L5 stable from the prior CT. No other fractures. No osteoblastic or osteolytic lesions.  IMPRESSION: 1. Sigmoid colon diverticulitis seen on the prior exams has essentially resolved. No new areas of colonic inflammation. 2. Small amount nonspecific fluid tracks along the right iliac vessels into the posterior pelvis, stable from the prior exam. There is another small fluid collection in the central small bowel mesentery, also stable. 3. No acute findings in the abdomen or pelvis. 4. Stable splenomegaly.   Electronically Signed   By: Lajean Manes M.D.   On: 08/25/2016 16:48    Study Result   CLINICAL DATA:  Rectal pain.  Lymphoma.  EXAM: DG ABDOMEN ACUTE W/ 1V CHEST  COMPARISON:  CT abdomen pelvis 08/16/2016  FINDINGS: Heart size and vascularity normal. Lungs are clear without infiltrate or effusion. No mass or  adenopathy. Port-A-Cath tip in the SVC unchanged from 06/10/2016.  Normal bowel gas pattern. No bowel obstruction or free air. Air-fluid levels in the right colon. No bowel edema. No abnormal calcifications. No acute skeletal abnormality.  IMPRESSION: Negative abdominal radiographs.  No acute cardiopulmonary disease.   Electronically Signed   By: Franchot Gallo M.D.   On: 08/25/2016 14:16      ASSESSMENT & PLAN:  NHL, low grade, heavy marrow involvement Pancytopenia with neutropenia IgM Kappa monoclonal gammopathy L4-L5 vertebral fracture Splenomegaly secondary to NHL Diffuse osseous uptake on PET secondary to NHL Diverticulitis/diverticular abscess Urinary frequency  83 year old Caucasian male with good performance status with minimal chronic medical comorbidities with  #1 Pancytopenia with marrow showing lymphocytic infiltration concerning for lymphoproliferative process. LPL and MCL in the top differentials. He received multiple cycles of Rituxan but with little improvement in counts. Bendamustine has been added with good tolerance. Counts are improving. Energy is improving. He is profoundly neutropenic today.  Neutropenic precautions were addressed with the patient and his wife today. Neutropenic fever was discussed. He did receive neulasta. Hopefully counts will recover in the next week or so. Will delay therapy X 1 week with repeat labs.    #2 IgM Kappa monoclonal gammopathy with preliminary bone marrow picture showing lymphocytic infiltration concerning for Waldenstrom's Macroglobulinemia/lymphoplasmacytic lymphoma.As treatment progresses will continue to monitor. Have added SPEP and IEP today. His M protein is also steadily improving.   Paul Keith reports urinary incontinence. We previously conducted a UA which was normal. I have referred him to urologist, Dr. Alyson Ingles. He is on flomax.   All of the patients and his family's questions were answered in details to their  apparent satisfaction. The patient knows to call the clinic with any problems, questions or concerns.  Labs reviewed. Results noted above.  This document serves as a record of services personally performed by Ancil Linsey, MD.  It was created on her behalf by Shirlean Mylar, a trained medical scribe. The creation of this record is based on the scribe's personal observations and the provider's statements to them. This document has been checked and approved by the attending provider.  I have reviewed the above documentation for accuracy and completeness, and I agree with the above.  Kelby Fam. Sang Blount, MD  09/14/2016 8:19 AM

## 2016-11-23 NOTE — Progress Notes (Signed)
CRITICAL VALUE ALERT Critical value received:  WBC 0.9 Date of notification:  11/24/2015 Time of notification: 0917 Critical value read back:  Yes.   Nurse who received alert:  C.Jacquel Mccamish RN MD notified (1st Shephanie Romas): Dr.Penland at [facility] held today for one week per Dr.Penland

## 2016-11-23 NOTE — Patient Instructions (Signed)
[place] at [facility] Discharge Instructions  RECOMMENDATIONS MADE BY THE CONSULTANT AND ANY TEST RESULTS WILL BE SENT TO YOUR REFERRING PHYSICIAN.  NO chemo today per Dr.Penland WBC low, follow up as scheduled.  Thank you for choosing [place] at [facility] to provide your oncology and hematology care.  To afford each patient quality time with our provider, please arrive at least 15 minutes before your scheduled appointment time.    If you have a lab appointment with the [place] please come in thru the  Main Entrance and check in at the main information desk  You need to re-schedule your appointment should you arrive 10 or more minutes late.  We strive to give you quality time with our providers, and arriving late affects you and other patients whose appointments are after yours.  Also, if you no show three or more times for appointments you may be dismissed from the clinic at the providers discretion.     Again, thank you for choosing [facility].  Our hope is that these requests will decrease the amount of time that you wait before being seen by our physicians.       _____________________________________________________________  Should you have questions after your visit to [facility], please contact our office at [phone] between the hours of 8:30 a.m. and 4:30 p.m.  Voicemails left after 4:30 p.m. will not be returned until the following business day.  For prescription refill requests, have your pharmacy contact our office.       Resources For Cancer Patients and their Caregivers ? American Cancer Society: Can assist with transportation, wigs, general needs, runs Look Good Feel Better.        5756350877 ? Cancer Care: Provides financial assistance, online support groups, medication/co-pay assistance.  1-800-813-HOPE (650) 017-1798) ? [place] Assists [place]  cancer patients and their families through emotional , educational and financial support.  410-664-3466 ? [place] DSS Where to apply for food stamps, Medicaid and utility assistance. 2203050720 ? [employer]: Transportation to medical appointments. [phone] ? Social Security Administration: May apply for disability if have a Stage IV cancer. 7252994613 [phone] ? [employer], Disability and Transit Services: Assists with nutrition, care and transit needs. [place] Support Programs: @10RELATIVEDAYS @ > Cancer Support Group  2nd Tuesday of the month 1pm-2pm, Journey Room  > Creative Journey  3rd Tuesday of the month 1130am-1pm, Journey Room  > Look Good Feel Better  1st Wednesday of the month 10am-12 noon, Journey Room (Call [place] to register [phone])

## 2016-11-23 NOTE — Patient Instructions (Signed)
[place] at [facility] Discharge Instructions  RECOMMENDATIONS MADE BY THE CONSULTANT AND ANY TEST RESULTS WILL BE SENT TO YOUR REFERRING PHYSICIAN.  You were seen today by Dr. Whitney Muse We are going to post-pone your chemo until next week You will have an appointment with her again with your next cycle.  Thank you for choosing [place] at [facility] to provide your oncology and hematology care.  To afford each patient quality time with our provider, please arrive at least 15 minutes before your scheduled appointment time.    If you have a lab appointment with the [name] please come in thru the  Main Entrance and check in at the main information desk  You need to re-schedule your appointment should you arrive 10 or more minutes late.  We strive to give you quality time with our providers, and arriving late affects you and other patients whose appointments are after yours.  Also, if you no show three or more times for appointments you may be dismissed from the clinic at the providers discretion.     Again, thank you for choosing [facility].  Our hope is that these requests will decrease the amount of time that you wait before being seen by our physicians.       _____________________________________________________________  Should you have questions after your visit to [facility], please contact our office at [phone] between the hours of 8:30 a.m. and 4:30 p.m.  Voicemails left after 4:30 p.m. will not be returned until the following business day.  For prescription refill requests, have your pharmacy contact our office.       Resources For Cancer Patients and their Caregivers ? American Cancer Society: Can assist with transportation, wigs, general needs, runs Look Good Feel Better.        ([phone] ? Cancer Care: Provides financial assistance, online support groups, medication/co-pay  assistance.  1-800-813-HOPE (505)038-6621) ? [place] Assists [place] cancer patients and their families through emotional , educational and financial support.  878-035-7321 ? [place] DSS Where to apply for food stamps, Medicaid and utility assistance. (414)131-9642 ? [employer]: Transportation to medical appointments. 9390369313 ? Social Security Administration: May apply for disability if have a Stage IV cancer. 817-078-6827 [phone] ? [employer], Disability and Transit Services: Assists with nutrition, care and transit needs. [place] Support Programs: @10RELATIVEDAYS @ > Cancer Support Group  2nd Tuesday of the month 1pm-2pm, Journey Room  > Creative Journey  3rd Tuesday of the month 1130am-1pm, Journey Room  > Look Good Feel Better  1st Wednesday of the month 10am-12 noon, Journey Room (Call [place] to register [phone])

## 2016-11-24 ENCOUNTER — Ambulatory Visit (HOSPITAL_COMMUNITY): Payer: Medicare Other

## 2016-11-24 LAB — IMMUNOFIXATION ELECTROPHORESIS
IGG (IMMUNOGLOBIN G), SERUM: 404 mg/dL — AB (ref 700–1600)
IGM, SERUM: 87 mg/dL (ref 15–143)
IgA: 87 mg/dL (ref 61–437)
TOTAL PROTEIN ELP: 5.5 g/dL — AB (ref 6.0–8.5)

## 2016-11-24 LAB — PROTEIN ELECTROPHORESIS, SERUM
A/G RATIO SPE: 1.8 — AB (ref 0.7–1.7)
ALBUMIN ELP: 3.5 g/dL (ref 2.9–4.4)
ALPHA-1-GLOBULIN: 0.2 g/dL (ref 0.0–0.4)
Alpha-2-Globulin: 0.4 g/dL (ref 0.4–1.0)
Beta Globulin: 0.8 g/dL (ref 0.7–1.3)
GAMMA GLOBULIN: 0.4 g/dL (ref 0.4–1.8)
GLOBULIN, TOTAL: 1.9 g/dL — AB (ref 2.2–3.9)
M-Spike, %: 0.1 g/dL — ABNORMAL HIGH
PDF SPE: 0
TOTAL PROTEIN ELP: 5.4 g/dL — AB (ref 6.0–8.5)

## 2016-11-25 ENCOUNTER — Emergency Department (HOSPITAL_COMMUNITY): Payer: Medicare Other

## 2016-11-25 ENCOUNTER — Encounter (HOSPITAL_COMMUNITY): Payer: Self-pay

## 2016-11-25 ENCOUNTER — Telehealth (HOSPITAL_COMMUNITY): Payer: Self-pay | Admitting: Emergency Medicine

## 2016-11-25 ENCOUNTER — Inpatient Hospital Stay (HOSPITAL_COMMUNITY)
Admission: EM | Admit: 2016-11-25 | Discharge: 2016-11-28 | DRG: 809 | Disposition: A | Discharge status: Home / Self Care | Payer: Medicare Other | Attending: Internal Medicine | Admitting: Internal Medicine

## 2016-11-25 ENCOUNTER — Other Ambulatory Visit: Payer: Self-pay

## 2016-11-25 DIAGNOSIS — D649 Anemia, unspecified: Secondary | ICD-10-CM | POA: Diagnosis present

## 2016-11-25 DIAGNOSIS — T451X5A Adverse effect of antineoplastic and immunosuppressive drugs, initial encounter: Secondary | ICD-10-CM | POA: Diagnosis present

## 2016-11-25 DIAGNOSIS — K529 Noninfective gastroenteritis and colitis, unspecified: Secondary | ICD-10-CM | POA: Diagnosis present

## 2016-11-25 DIAGNOSIS — Z79899 Other long term (current) drug therapy: Secondary | ICD-10-CM

## 2016-11-25 DIAGNOSIS — D709 Neutropenia, unspecified: Principal | ICD-10-CM | POA: Diagnosis present

## 2016-11-25 DIAGNOSIS — W010XXA Fall on same level from slipping, tripping and stumbling without subsequent striking against object, initial encounter: Secondary | ICD-10-CM | POA: Diagnosis present

## 2016-11-25 DIAGNOSIS — R5081 Fever presenting with conditions classified elsewhere: Secondary | ICD-10-CM | POA: Diagnosis present

## 2016-11-25 DIAGNOSIS — N179 Acute kidney failure, unspecified: Secondary | ICD-10-CM | POA: Diagnosis present

## 2016-11-25 DIAGNOSIS — R197 Diarrhea, unspecified: Secondary | ICD-10-CM | POA: Diagnosis present

## 2016-11-25 DIAGNOSIS — C8300 Small cell B-cell lymphoma, unspecified site: Secondary | ICD-10-CM | POA: Diagnosis present

## 2016-11-25 DIAGNOSIS — Z9189 Other specified personal risk factors, not elsewhere classified: Secondary | ICD-10-CM | POA: Diagnosis present

## 2016-11-25 DIAGNOSIS — E876 Hypokalemia: Secondary | ICD-10-CM | POA: Diagnosis present

## 2016-11-25 DIAGNOSIS — D61818 Other pancytopenia: Secondary | ICD-10-CM

## 2016-11-25 DIAGNOSIS — D472 Monoclonal gammopathy: Secondary | ICD-10-CM | POA: Diagnosis present

## 2016-11-25 DIAGNOSIS — K5790 Diverticulosis of intestine, part unspecified, without perforation or abscess without bleeding: Secondary | ICD-10-CM | POA: Diagnosis present

## 2016-11-25 DIAGNOSIS — E860 Dehydration: Secondary | ICD-10-CM | POA: Diagnosis present

## 2016-11-25 DIAGNOSIS — Z87891 Personal history of nicotine dependence: Secondary | ICD-10-CM

## 2016-11-25 DIAGNOSIS — D500 Iron deficiency anemia secondary to blood loss (chronic): Secondary | ICD-10-CM | POA: Diagnosis not present

## 2016-11-25 LAB — COMPREHENSIVE METABOLIC PANEL
ALT: 13 U/L — ABNORMAL LOW (ref 17–63)
AST: 16 U/L (ref 15–41)
Albumin: 3.7 g/dL (ref 3.5–5.0)
Alkaline Phosphatase: 61 U/L (ref 38–126)
Anion gap: 7 (ref 5–15)
BILIRUBIN TOTAL: 0.6 mg/dL (ref 0.3–1.2)
BUN: 22 mg/dL — AB (ref 6–20)
CALCIUM: 8.8 mg/dL — AB (ref 8.9–10.3)
CHLORIDE: 97 mmol/L — AB (ref 101–111)
CO2: 26 mmol/L (ref 22–32)
CREATININE: 1.4 mg/dL — AB (ref 0.61–1.24)
GFR, EST AFRICAN AMERICAN: 52 mL/min — AB (ref >60)
GFR, EST NON AFRICAN AMERICAN: 45 mL/min — AB (ref >60)
Glucose, Bld: 121 mg/dL — ABNORMAL HIGH (ref 65–99)
Potassium: 4 mmol/L (ref 3.5–5.1)
Sodium: 130 mmol/L — ABNORMAL LOW (ref 135–145)
TOTAL PROTEIN: 6 g/dL — AB (ref 6.5–8.1)

## 2016-11-25 LAB — PROTIME-INR
INR: 1.08
Prothrombin Time: 14 seconds (ref 11.4–15.2)

## 2016-11-25 LAB — CBC WITH DIFFERENTIAL/PLATELET
BAND NEUTROPHILS: 0 %
BLASTS: 0 %
Basophils Absolute: 0 10*3/uL (ref 0.0–0.1)
Basophils Relative: 1 %
EOS ABS: 0 10*3/uL (ref 0.0–0.7)
Eosinophils Relative: 1 %
HEMATOCRIT: 33.1 % — AB (ref 39.0–52.0)
Hemoglobin: 11.7 g/dL — ABNORMAL LOW (ref 13.0–17.0)
Lymphocytes Relative: 36 %
Lymphs Abs: 0.4 10*3/uL — ABNORMAL LOW (ref 0.7–4.0)
MCH: 31.9 pg (ref 26.0–34.0)
MCHC: 35.3 g/dL (ref 30.0–36.0)
MCV: 90.2 fL (ref 78.0–100.0)
METAMYELOCYTES PCT: 0 %
MYELOCYTES: 0 %
Monocytes Absolute: 0.7 10*3/uL (ref 0.1–1.0)
Monocytes Relative: 61 %
NEUTROS ABS: 0 10*3/uL — AB (ref 1.7–7.7)
Neutrophils Relative %: 1 %
Other: 0 %
PROMYELOCYTES ABS: 0 %
Platelets: 95 10*3/uL — ABNORMAL LOW (ref 150–400)
RBC: 3.67 MIL/uL — ABNORMAL LOW (ref 4.22–5.81)
RDW: 14 % (ref 11.5–15.5)
WBC: 1.1 10*3/uL — AB (ref 4.0–10.5)
nRBC: 0 /100 WBC

## 2016-11-25 LAB — URINALYSIS, ROUTINE W REFLEX MICROSCOPIC
Bilirubin Urine: NEGATIVE
GLUCOSE, UA: NEGATIVE mg/dL
Hgb urine dipstick: NEGATIVE
Ketones, ur: NEGATIVE mg/dL
LEUKOCYTES UA: NEGATIVE
Nitrite: NEGATIVE
PROTEIN: NEGATIVE mg/dL
SPECIFIC GRAVITY, URINE: 1.017 (ref 1.005–1.030)
pH: 5 (ref 5.0–8.0)

## 2016-11-25 LAB — I-STAT CG4 LACTIC ACID, ED: LACTIC ACID, VENOUS: 1.11 mmol/L (ref 0.5–1.9)

## 2016-11-25 MED ORDER — ACETAMINOPHEN 500 MG PO TABS: 500.0000 mg | ORAL_TABLET | Freq: Four times a day (QID) | ORAL | Status: DC | PRN

## 2016-11-25 MED ORDER — PIPERACILLIN-TAZOBACTAM 3.375 G IVPB 30 MIN
3.3750 g | Freq: Once | INTRAVENOUS | Status: AC
  Administered 2016-11-25: 3.375 g via INTRAVENOUS
  Filled 2016-11-25: qty 50

## 2016-11-25 MED ORDER — ACETAMINOPHEN 325 MG PO TABS
650.0000 mg | ORAL_TABLET | Freq: Once | ORAL | Status: AC
  Administered 2016-11-25: 650 mg via ORAL
  Filled 2016-11-25: qty 2

## 2016-11-25 MED ORDER — SODIUM CHLORIDE 0.9% FLUSH
3.0000 mL | Freq: Two times a day (BID) | INTRAVENOUS | Status: DC
  Administered 2016-11-25 – 2016-11-28 (×4): 3 mL via INTRAVENOUS

## 2016-11-25 MED ORDER — DEXTROSE 5 % IV SOLN
INTRAVENOUS | Status: AC
  Filled 2016-11-25: qty 1

## 2016-11-25 MED ORDER — DEXTROSE 5 % IV SOLN
1.0000 g | Freq: Two times a day (BID) | INTRAVENOUS | Status: DC
  Administered 2016-11-25 – 2016-11-27 (×5): 1 g via INTRAVENOUS
  Filled 2016-11-25 (×8): qty 1

## 2016-11-25 MED ORDER — ACYCLOVIR 400 MG PO TABS
400.0000 mg | ORAL_TABLET | Freq: Every day | ORAL | Status: DC
  Filled 2016-11-25: qty 1

## 2016-11-25 MED ORDER — B COMPLEX-C PO TABS
1.0000 | ORAL_TABLET | Freq: Every day | ORAL | Status: DC
  Administered 2016-11-26 – 2016-11-28 (×3): 1 via ORAL
  Filled 2016-11-25 (×5): qty 1

## 2016-11-25 MED ORDER — FILGRASTIM 480 MCG/1.6ML IJ SOLN
480.0000 ug | Freq: Once | INTRAMUSCULAR | Status: AC
  Administered 2016-11-25: 480 ug via SUBCUTANEOUS
  Filled 2016-11-25: qty 1.6

## 2016-11-25 MED ORDER — TAMSULOSIN HCL 0.4 MG PO CAPS
0.8000 mg | ORAL_CAPSULE | Freq: Every day | ORAL | Status: DC
  Administered 2016-11-25 – 2016-11-27 (×3): 0.8 mg via ORAL
  Filled 2016-11-25 (×3): qty 2

## 2016-11-25 MED ORDER — SODIUM CHLORIDE 0.9 % IV BOLUS (SEPSIS)
1500.0000 mL | Freq: Once | INTRAVENOUS | Status: AC
  Administered 2016-11-25: 1500 mL via INTRAVENOUS

## 2016-11-25 NOTE — ED Triage Notes (Signed)
Pt reports chills that started last night.  Today c/o generalized weakness and fever at home as high as 102.  Pt says he fell in the bathroom today because he slipped in some urine and had difficulty getting up due to weakness.  Denies any cough, congestion, n/v/d.

## 2016-11-25 NOTE — Telephone Encounter (Signed)
[name] called and stated that Paul Keith was very weak and didn't feel good.  Pts temp is 102.9.  I told them to go to the ER.  Dr Whitney Muse was in agreement.

## 2016-11-25 NOTE — H&P (Signed)
History and Physical    Paul Keith VXB:[id number] DOB: 1930-03-10 DOA: 11/25/2016  PCP: Octavio Graves, DO  Patient coming from: Home.    Chief Complaint:  Febrile neutropenia.   HPI: Paul Keith is an 83 y.o. male with hx of NHL B cell, mixed plasma cell lymphoma,  undergoing chemotherapy, with known pancytopenia WBC of 0.9, received neulasta 2 days ago, hx of anemia and diverticulosis, found to have temp of 102, and presented to the ER.  He has a port a cath, but denied pain, erythema, chills, coughs, GI or GU symptoms.  Work up in the ER showed WBC of 1.1 with 1% neutrophils, clear CXR and UA pending.  His Cr was elevated at 1.4.  He has no rash.  He was given IV Zosyn and hospitalist was asked to admit him for febrile neutropenia.  He has no hypotension and his lactic acid is normal.  He was pan cultured  ED Course:  See above.  Rewiew of Systems:  Constitutional: Negative for malaise, fever and chills. No significant weight loss or weight gain Eyes: Negative for eye pain, redness and discharge, diplopia, visual changes, or flashes of light. ENMT: Negative for ear pain, hoarseness, nasal congestion, sinus pressure and sore throat. No headaches; tinnitus, drooling, or problem swallowing. Cardiovascular: Negative for chest pain, palpitations, diaphoresis, dyspnea and peripheral edema. ; No orthopnea, PND Respiratory: Negative for cough, hemoptysis, wheezing and stridor. No pleuritic chestpain. Gastrointestinal: Negative for diarrhea, constipation,  melena, blood in stool, hematemesis, jaundice and rectal bleeding.    Genitourinary: Negative for frequency, dysuria, incontinence,flank pain and hematuria; Musculoskeletal: Negative for back pain and neck pain. Negative for swelling and trauma.;  Skin: . Negative for pruritus, rash, abrasions, bruising and skin lesion.; ulcerations Neuro: Negative for headache, lightheadedness and neck stiffness. Negative for weakness, altered level of  consciousness , altered mental status, extremity weakness, burning feet, involuntary movement, seizure and syncope.  Psych: negative for anxiety, depression, insomnia, tearfulness, panic attacks, hallucinations, paranoia, suicidal or homicidal ideation    Past Medical History:  Diagnosis Date  . Anemia 04/22/2016  . Diverticulosis    pt reports recent admission for diverticulosis perforation this past week  . 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 Pancytopenia ([place]) 04/22/2016    Past Surgical History:  Procedure Laterality Date  . CATARACT EXTRACTION Bilateral   . TONSILLECTOMY       reports that he quit smoking about 54 years ago. He has never used smokeless tobacco. He reports that he drinks alcohol. He reports that he does not use drugs.  No Known Allergies  No family history on file.   Prior to Admission medications   Medication Sig Start Date End Date Taking? Authorizing Provider  acetaminophen (TYLENOL) 500 MG tablet Take 500 mg by mouth every 6 (six) hours as needed for mild pain.   Yes Historical Provider, MD  acyclovir (ZOVIRAX) 400 MG tablet Take 1 tablet (400 mg total) by mouth daily. 08/10/16  Yes Patrici Ranks, MD  B Complex-C (B-COMPLEX WITH VITAMIN C) tablet Take 1 tablet by mouth daily.   Yes Historical Provider, MD  bendamustine in sodium chloride 0.9 % 50 mL Inject into the vein once. Every 28 days   Yes Historical Provider, MD  cholecalciferol (VITAMIN D) 1000 units tablet Take 1,000 Units by mouth daily.   Yes Historical Provider, MD  Cyanocobalamin (B-12 PO) Take 1 tablet by mouth daily.   Yes Historical Provider, MD  cyanocobalamin 100 MCG tablet Take by mouth daily. Pt reports unsure of dosage, takes once daily   Yes Historical Provider, MD  dexamethasone (DECADRON) 4 MG tablet Take 2 tablets (8 mg total) by mouth daily. Start the day after bendamustine chemotherapy for 2 days. Take with  food. 08/10/16  Yes Patrici Ranks, MD  meclizine (ANTIVERT) 32 MG tablet Take 1 tablet (32 mg total) by mouth 3 (three) times daily as needed. 09/16/16  Yes Patrici Ranks, MD  Multiple Vitamin (MULTIVITAMIN WITH MINERALS) TABS tablet Take 1 tablet by mouth daily.   Yes Historical Provider, MD  ondansetron (ZOFRAN) 8 MG tablet Take 1 tablet (8 mg total) by mouth 2 (two) times daily as needed for nausea or vomiting. Start on day 2 after bendamustine. 08/10/16  Yes Patrici Ranks, MD  prochlorperazine (COMPAZINE) 10 MG tablet Take 1 tablet (10 mg total) by mouth every 6 (six) hours as needed (Nausea or vomiting). 08/10/16  Yes Patrici Ranks, MD  RiTUXimab (RITUXAN IV) Inject into the vein as directed.    Yes Historical Provider, MD  tamsulosin (FLOMAX) 0.4 MG CAPS capsule Take 0.8 mg by mouth daily after supper.    Yes Historical Provider, MD  traMADol (ULTRAM) 50 MG tablet Take 1 tablet (50 mg total) by mouth every 6 (six) hours as needed. Patient not taking: Reported on 11/25/2016 08/25/16   Milton Ferguson, MD    Physical Exam: Vitals:   11/25/16 1730 11/25/16 1753 11/25/16 1800 11/25/16 1830  BP: 112/57  107/56 (!) 105/54  Pulse: 87  88 82  Resp: 17  15 10   Temp:  99.7 F (37.6 C)    TempSrc:  Oral    SpO2: 94%  99% 98%  Weight:      Height:       Constitutional: NAD, calm, comfortable Vitals:   11/25/16 1730 11/25/16 1753 11/25/16 1800 11/25/16 1830  BP: 112/57  107/56 (!) 105/54  Pulse: 87  88 82  Resp: 17  15 10   Temp:  99.7 F (37.6 C)    TempSrc:  Oral    SpO2: 94%  99% 98%  Weight:      Height:       Eyes: PERRL, lids and conjunctivae normal ENMT: Mucous membranes are moist. Posterior pharynx clear of any exudate or lesions.Normal dentition.  Neck: normal, supple, no masses, no thyromegaly Respiratory: clear to auscultation bilaterally, no wheezing, no crackles. Normal respiratory effort. No accessory muscle use.  Cardiovascular: Regular rate and rhythm, no  murmurs / rubs / gallops. No extremity edema. 2+ pedal pulses. No carotid bruits.  Abdomen: no tenderness, no masses palpated. No hepatosplenomegaly. Bowel sounds positive.  Musculoskeletal: no clubbing / cyanosis. No joint deformity upper and lower extremities. Good ROM, no contractures. Normal muscle tone.  Skin: no rashes, lesions, ulcers. No induration Neurologic: CN 2-12 grossly intact. Sensation intact, DTR normal. Strength 5/5 in all 4.  Psychiatric: Normal judgment and insight. Alert and oriented x 3. Normal mood.    Labs on Admission: I have personally reviewed following labs and imaging studies CBC:  Recent Labs Lab 11/23/16 0848 11/25/16 1555  WBC 0.9* 1.1*  NEUTROABS 0.0* 0.0*  HGB 11.1* 11.7*  HCT 31.7* 33.1*  MCV 90.6 90.2  PLT 122* 95*   Basic Metabolic Panel:  Recent Labs Lab 11/23/16 0848 11/25/16 1555  NA 134* 130*  K 4.2 4.0  CL 102 97*  CO2 26 26  GLUCOSE 111* 121*  BUN 18 22*  CREATININE 1.22 1.40*  CALCIUM 9.0 8.8*   GFR: Estimated Creatinine Clearance: 38.8 mL/min (by C-G formula based on SCr of 1.4 mg/dL (H)). Liver Function Tests:  Recent Labs Lab 11/23/16 0848 11/25/16 1555  AST 19 16  ALT 14* 13*  ALKPHOS 67 61  BILITOT 0.5 0.6  PROT 5.7* 6.0*  ALBUMIN 3.7 3.7   Coagulation Profile:  Recent Labs Lab 11/25/16 1555  INR 1.08   Urine analysis:    Component Value Date/Time   COLORURINE AMBER (A) 11/25/2016 1526   APPEARANCEUR HAZY (A) 11/25/2016 1526   LABSPEC 1.017 11/25/2016 1526   PHURINE 5.0 11/25/2016 1526   GLUCOSEU NEGATIVE 11/25/2016 1526   HGBUR NEGATIVE 11/25/2016 [place] 11/25/2016 1526   [place] 11/25/2016 [place] 11/25/2016 1526   NITRITE NEGATIVE 11/25/2016 1526   LEUKOCYTESUR NEGATIVE 11/25/2016 1526    Recent Results (from the past 240 hour(s))  Culture, blood (Routine x 2)     Status: None (Preliminary result)   Collection Time: 11/25/16  3:47 PM    Result Value Ref Range Status   Specimen Description RIGHT ANTECUBITAL  Final   Special Requests BOTTLES DRAWN AEROBIC AND ANAEROBIC 6CC  Final   Culture PENDING  Incomplete   Report Status PENDING  Incomplete     Radiological Exams on Admission: Dg Chest 2 View  Result Date: 11/25/2016 CLINICAL DATA:  Weakness, nonproductive cough and frequent urination. EXAM: CHEST  2 VIEW COMPARISON:  08/25/2016 FINDINGS: Injectable port terminates in the expected location of distal superior vena cava, unchanged. Cardiomediastinal silhouette is normal. Mediastinal contours appear intact. There is no evidence of focal airspace consolidation, pleural effusion or pneumothorax. Osseous structures are without acute abnormality. Soft tissues are grossly normal. IMPRESSION: No active cardiopulmonary disease. Electronically Signed   By: Fidela Salisbury M.D.   On: 11/25/2016 15:43    EKG: Independently reviewed.   Assessment/Plan    PLAN: Neutropenic fever.  Will admit and continue with IV antibiotic.  I will give IV Cepefime with no vancomycin.  His Cr is slightly elevated also.  His port doesn't look infected.  He has received one dose of Neulasta, and I suspect his WBC will recover.  He is not septic, and his lactic acid is normal.  Will give some IVF and follow up with Cr in the morning.    DVT prophylaxis: SCD.  Code Status: FULL CODE.  Family Communication: Wife at bedside.  Disposition Plan: likely to home when appropriate.  Consults called: None. Admission status: inpatient.    Fronie Holstein MD FACP. [facility]  If 7PM-7AM, please contact night-coverage [email or web address] Password [facility]  11/25/2016, 6:59 PM

## 2016-11-25 NOTE — ED Provider Notes (Signed)
[facility] DEPT Provider Note   CSN: [id number] Arrival date & time: 11/25/16  1518     History   Chief Complaint Chief Complaint  Patient presents with  . Fever    HPI Paul Keith is a 83 y.o. male.  HPI C/o weakness.  [place] today after slipping in the bathroom. Did not get weak but had trouble standing up when he had to get up.  At home had a temp up to 102.  Has not been having trouble with fevers until today. No cough. No vomit or diarrhea.  No rash.  No sore throat. INcreased urination.  7 times last night.   No pain.   Last chemo 1 month ago.  He called his oncologist and was instructed to come to the ED for evaluation. Past Medical History:  Diagnosis Date  . Anemia 04/22/2016  . Diverticulosis    pt reports recent admission for diverticulosis perforation this past week  . 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 Pancytopenia ([place]) 04/22/2016    Patient Active Problem List   Diagnosis Date Noted  . Urinary frequency 09/14/2016  . At high risk for infection due to neutropenia ([place]) 07/23/2016  . [place]den[place] macroglobulinemia ([place]) 05/21/2016  . 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 IgM monoclonal gammopathy of uncertain significance 05/07/2016  . Lymphoproliferative disease ([place]) 05/07/2016  . Anemia 04/22/2016  . Pancytopenia ([place]) 04/22/2016    Past Surgical History:  Procedure Laterality Date  . CATARACT EXTRACTION Bilateral   . TONSILLECTOMY         Home Medications    Prior to Admission medications   Medication Sig Start Date End Date Taking? Authorizing Provider  acetaminophen (TYLENOL) 500 MG tablet Take 500 mg by mouth every 6 (six) hours as needed for mild pain.   Yes Historical Provider, MD  acyclovir (ZOVIRAX) 400 MG tablet Take 1 tablet (400 mg total) by mouth daily. 08/10/16  Yes Patrici Ranks, MD  B Complex-C (B-COMPLEX WITH VITAMIN C) tablet Take 1  tablet by mouth daily.   Yes Historical Provider, MD  bendamustine in sodium chloride 0.9 % 50 mL Inject into the vein once. Every 28 days   Yes Historical Provider, MD  cholecalciferol (VITAMIN D) 1000 units tablet Take 1,000 Units by mouth daily.   Yes Historical Provider, MD  Cyanocobalamin (B-12 PO) Take 1 tablet by mouth daily.   Yes Historical Provider, MD  cyanocobalamin 100 MCG tablet Take by mouth daily. Pt reports unsure of dosage, takes once daily   Yes Historical Provider, MD  dexamethasone (DECADRON) 4 MG tablet Take 2 tablets (8 mg total) by mouth daily. Start the day after bendamustine chemotherapy for 2 days. Take with food. 08/10/16  Yes Patrici Ranks, MD  meclizine (ANTIVERT) 32 MG tablet Take 1 tablet (32 mg total) by mouth 3 (three) times daily as needed. 09/16/16  Yes Patrici Ranks, MD  Multiple Vitamin (MULTIVITAMIN WITH MINERALS) TABS tablet Take 1 tablet by mouth daily.   Yes Historical Provider, MD  ondansetron (ZOFRAN) 8 MG tablet Take 1 tablet (8 mg total) by mouth 2 (two) times daily as needed for nausea or vomiting. Start on day 2 after bendamustine. 08/10/16  Yes Patrici Ranks, MD  prochlorperazine (COMPAZINE) 10 MG tablet Take 1 tablet (10 mg total) by mouth every 6 (six) hours as needed (Nausea or vomiting). 08/10/16  Yes Patrici Ranks, MD  RiTUXimab (RITUXAN IV) Inject into the vein  as directed.    Yes Historical Provider, MD  tamsulosin (FLOMAX) 0.4 MG CAPS capsule Take 0.8 mg by mouth daily after supper.    Yes Historical Provider, MD  traMADol (ULTRAM) 50 MG tablet Take 1 tablet (50 mg total) by mouth every 6 (six) hours as needed. Patient not taking: Reported on 11/25/2016 08/25/16   Milton Ferguson, MD    Family History No family history on file.  Social History Social History  Substance Use Topics  . Smoking status: Former Smoker    Quit date: 09/15/1962  . Smokeless tobacco: Never Used  . Alcohol use Yes     Comment: OCCASSIONAL      Allergies   Patient has no known allergies.   Review of Systems Review of Systems  All other systems reviewed and are negative.    Physical Exam Updated Vital Signs BP 112/57   Pulse 87   Temp 99.7 F (37.6 C) (Oral)   Resp 17   Ht 6' (1.829 m)   Wt 69.9 kg   SpO2 94%   BMI 20.89 kg/m   Physical Exam  Constitutional: He appears well-developed and well-nourished. No distress.  HENT:  Head: Normocephalic and atraumatic.  Right Ear: External ear normal.  Left Ear: External ear normal.  Eyes: Conjunctivae are normal. Right eye exhibits no discharge. Left eye exhibits no discharge. No scleral icterus.  Neck: Neck supple. No tracheal deviation present.  Cardiovascular: Normal rate, regular rhythm and intact distal pulses.   Pulmonary/Chest: Effort normal and breath sounds normal. No stridor. No respiratory distress. He has no wheezes. He has no rales.  portacath right anterior chest wall, no erythema, no ttp  Abdominal: Soft. Bowel sounds are normal. He exhibits no distension. There is no tenderness. There is no rebound and no guarding.  Musculoskeletal: He exhibits no edema or tenderness.  Neurological: He is alert. He has normal strength. No cranial nerve deficit (no facial droop, extraocular movements intact, no slurred speech) or sensory deficit. He exhibits normal muscle tone. He displays no seizure activity. Coordination normal.  Skin: Skin is warm and dry. No rash noted.  Psychiatric: He has a normal mood and affect.  Nursing note and vitals reviewed.    ED Treatments / Results  Labs (all labs ordered are listed, but only abnormal results are displayed) Labs Reviewed  COMPREHENSIVE METABOLIC PANEL - Abnormal; Notable for the following:       Result Value   Sodium 130 (*)    Chloride 97 (*)    Glucose, Bld 121 (*)    BUN 22 (*)    Creatinine, Ser 1.40 (*)    Calcium 8.8 (*)    Total Protein 6.0 (*)    ALT 13 (*)    GFR calc non Af Amer 45 (*)    GFR  calc Af Amer 52 (*)    All other components within normal limits  CBC WITH DIFFERENTIAL/PLATELET - Abnormal; Notable for the following:    WBC 1.1 (*)    RBC 3.67 (*)    Hemoglobin 11.7 (*)    HCT 33.1 (*)    Platelets 95 (*)    Neutro Abs 0.0 (*)    Lymphs Abs 0.4 (*)    All other components within normal limits  URINALYSIS, ROUTINE W REFLEX MICROSCOPIC - Abnormal; Notable for the following:    Color, Urine AMBER (*)    APPearance HAZY (*)    All other components within normal limits  CULTURE, BLOOD (ROUTINE X 2)  CULTURE, BLOOD (ROUTINE X 2)  URINE CULTURE  PROTIME-INR  I-STAT CG4 LACTIC ACID, ED  I-STAT CG4 LACTIC ACID, ED    EKG  EKG Interpretation  Date/Time:  Thursday November 25 2016 16:25:17 EST Ventricular Rate:  97 PR Interval:    QRS Duration: 105 QT Interval:  351 QTC Calculation: 446 R Axis:   -22 Text Interpretation:  Sinus rhythm Borderline left axis deviation Anteroseptal infarct, old Nonspecific T abnormalities, lateral leads Baseline wander in lead(s) I III aVL V5 V6 Since last tracing rate faster Confirmed by Doyne Ellinger  MD-J, Tyjah Hai ([id number]) on 11/25/2016 4:40:27 PM       Radiology Dg Chest 2 View  Result Date: 11/25/2016 CLINICAL DATA:  Weakness, nonproductive cough and frequent urination. EXAM: CHEST  2 VIEW COMPARISON:  08/25/2016 FINDINGS: Injectable port terminates in the expected location of distal superior vena cava, unchanged. Cardiomediastinal silhouette is normal. Mediastinal contours appear intact. There is no evidence of focal airspace consolidation, pleural effusion or pneumothorax. Osseous structures are without acute abnormality. Soft tissues are grossly normal. IMPRESSION: No active cardiopulmonary disease. Electronically Signed   By: Fidela Salisbury M.D.   On: 11/25/2016 15:43    Procedures .Critical Care Performed by: Dorie Rank Authorized by: Dorie Rank   Critical care provider statement:    Critical care was time spent personally by  me on the following activities:  Discussions with consultants, evaluation of patient's response to treatment, examination of patient, ordering and performing treatments and interventions, ordering and review of laboratory studies, ordering and review of radiographic studies, pulse oximetry, re-evaluation of patient's condition, obtaining history from patient or surrogate and review of old charts   (including critical care time)  Medications Ordered in ED Medications  piperacillin-tazobactam (ZOSYN) IVPB 3.375 g (3.375 g Intravenous New Bag/Given 11/25/16 1744)  sodium chloride 0.9 % bolus 1,500 mL (0 mLs Intravenous Stopped 11/25/16 1753)  acetaminophen (TYLENOL) tablet 650 mg (650 mg Oral Given 11/25/16 1651)  filgrastim (NEUPOGEN) injection 480 mcg (480 mcg Subcutaneous Given 11/25/16 1746)     Initial Impression / Assessment and Plan / ED Course  I have reviewed the triage vital signs and the nursing notes.  Pertinent labs & imaging results that were available during my care of the patient were reviewed by me and considered in my medical decision making (see chart for details).  Clinical Course as of Nov 25 1757  Thu Nov 25, 2016  1620 Increased from 0.9 2 days ago.  Hgb is stable.  ANC pending WBC: (!!) 1.1 [JK]  1621 CXR negative  [JK]  1639 EKG  Normal sinus rhythm rate 97, borderline left axis deviation and normal  [JK]  1756 Zosyn IV ordered for neutropenic fever. . I discussed the case with Dr. Whitney Muse. She will follow along with the patient. She has ordered Neupogen. I will consult with the medical service for admission.  [JK]    Clinical Course User Index [JK] Dorie Rank, MD     Patient presents to the emergency room with fever. Patient has history of lymphoma and is undergoing chemotherapy but his most recent chemotherapy was held due to abnormal blood cell counts. Patient developed a fever today. He denies any focal symptoms. He's remained hemodynamically stable in the emergency  room. Initial laboratory tests show persistent neutropenia, anemia and thrombocytopenia. No pneumonia on chest x-ray. His urinalysis pending. I'll start patient on IV antibiotics. Plan on admission to the hospital for further treatment.  Final Clinical Impressions(s) / ED Diagnoses  Final diagnoses:  Neutropenic fever ([place])    New Prescriptions New Prescriptions   No medications on file     Dorie Rank, MD 11/25/16 1759

## 2016-11-26 DIAGNOSIS — D500 Iron deficiency anemia secondary to blood loss (chronic): Secondary | ICD-10-CM

## 2016-11-26 LAB — DIFFERENTIAL
BASOS ABS: 0 10*3/uL (ref 0.0–0.1)
BASOS PCT: 1 %
EOS ABS: 0 10*3/uL (ref 0.0–0.7)
EOS PCT: 1 %
LYMPHS PCT: 43 %
Lymphs Abs: 0.5 10*3/uL — ABNORMAL LOW (ref 0.7–4.0)
MONO ABS: 0.6 10*3/uL (ref 0.1–1.0)
Monocytes Relative: 52 %
NEUTROS PCT: 4 %
Neutro Abs: 0 10*3/uL — ABNORMAL LOW (ref 1.7–7.7)

## 2016-11-26 LAB — CBC
HEMATOCRIT: 28.1 % — AB (ref 39.0–52.0)
HEMOGLOBIN: 9.8 g/dL — AB (ref 13.0–17.0)
MCH: 31.4 pg (ref 26.0–34.0)
MCHC: 34.9 g/dL (ref 30.0–36.0)
MCV: 90.1 fL (ref 78.0–100.0)
Platelets: 91 10*3/uL — ABNORMAL LOW (ref 150–400)
RBC: 3.12 MIL/uL — ABNORMAL LOW (ref 4.22–5.81)
RDW: 14 % (ref 11.5–15.5)
WBC: 1.2 10*3/uL — AB (ref 4.0–10.5)

## 2016-11-26 LAB — INFLUENZA PANEL BY PCR (TYPE A & B)
INFLBPCR: NEGATIVE
Influenza A By PCR: NEGATIVE

## 2016-11-26 LAB — C DIFFICILE QUICK SCREEN W PCR REFLEX
C DIFFICILE (CDIFF) INTERP: NOT DETECTED
C DIFFICILE (CDIFF) TOXIN: NEGATIVE
C DIFFICLE (CDIFF) ANTIGEN: NEGATIVE

## 2016-11-26 MED ORDER — ACYCLOVIR 200 MG PO CAPS
400.0000 mg | ORAL_CAPSULE | Freq: Every day | ORAL | Status: DC
  Administered 2016-11-26 – 2016-11-28 (×3): 400 mg via ORAL
  Filled 2016-11-26 (×3): qty 2

## 2016-11-26 MED ORDER — SODIUM CHLORIDE 0.9 % IV SOLN: INTRAVENOUS | Status: DC

## 2016-11-26 MED ORDER — SODIUM CHLORIDE 0.9 % IV SOLN
INTRAVENOUS | Status: DC
  Administered 2016-11-26 – 2016-11-27 (×3): via INTRAVENOUS

## 2016-11-26 MED ORDER — FILGRASTIM 480 MCG/1.6ML IJ SOLN
480.0000 ug | Freq: Every day | INTRAMUSCULAR | Status: DC
  Administered 2016-11-26 – 2016-11-28 (×3): 480 ug via SUBCUTANEOUS
  Filled 2016-11-26 (×4): qty 1.6

## 2016-11-26 NOTE — Progress Notes (Signed)
PROGRESS NOTE                                                                                                                                                                                                             Patient Demographics:    Paul Keith, is a 83 y.o. male, DOB - [birth year], BHA:[id number]  Admit date - 11/25/2016   Admitting Physician Orvan Falconer, MD  Outpatient Primary MD for the patient is CYNTHIA BUTLER, DO  LOS - 1  Chief Complaint  Patient presents with  . Fever       Brief Narrative   Paul Keith is an 83 y.o. male with hx of NHL B cell, mixed plasma cell lymphoma,  undergoing chemotherapy, with known pancytopenia WBC of 0.9, received neulasta 2 days ago, hx of anemia and diverticulosis, found to have temp of 102, and presented to the ER.  He has a port a cath, but denied pain, erythema, chills, coughs, GI or GU symptoms.  Work up in the ER showed WBC of 1.1 with 1% neutrophils, clear CXR and UA pending.  His Cr was elevated at 1.4.  He has no rash.  He was given IV Zosyn and hospitalist was asked to admit him for febrile neutropenia.  He has no hypotension and his lactic acid is normal.  He was pan cultured.   Subjective:    Paul Keith today has, No headache, No chest pain, No abdominal pain - No Nausea, No new weakness tingling or numbness, No Cough - SOB.    Assessment  & Plan :     1.Febrile neutropenia. No clear source, appears nontoxic, continue gentle hydration, continue IV Maxipime, follow culture results. Flu negative.  2. History of malignant lymphoplasmacytic lymphoma, IgM monoclonal gammopathy of uncertain significance - undergoing chemotherapy, received Neulasta few days ago, if stable follow with heme on outpatient as before.  3. Chemotherapy-related pancytopenia. Supportive care and treatment as above.  4. ARF due to dehydration. Hydrate and monitor.  5. Diarrhea. Rule  out C. difficile. Supportive care for now, we will add Flagyl as he is neutropenic.    Family Communication  :  None  Code Status :  Full  Diet : Diet regular Room service appropriate? Yes; Fluid consistency: Thin   Disposition Plan  :  Home 1-2 days  Consults  :  Procedures  :  None  DVT Prophylaxis  :   SCDs    Lab Results  Component Value Date   PLT 91 (L) 11/26/2016    Inpatient Medications  Scheduled Meds: . acyclovir  400 mg Oral Daily  . B-complex with vitamin C  1 tablet Oral Daily  . ceFEPime (MAXIPIME) IV  1 g Intravenous Q12H  . sodium chloride flush  3 mL Intravenous Q12H  . tamsulosin  0.8 mg Oral QPC supper   Continuous Infusions: . sodium chloride     PRN Meds:.acetaminophen  Antibiotics  :    Anti-infectives    Start     Dose/Rate Route Frequency Ordered Stop   11/26/16 1000  acyclovir (ZOVIRAX) tablet 400 mg  Status:  Discontinued     400 mg Oral Daily 11/25/16 2223 11/26/16 0747   11/26/16 1000  acyclovir (ZOVIRAX) 200 MG capsule 400 mg     400 mg Oral Daily 11/26/16 0747     11/25/16 2300  ceFEPIme (MAXIPIME) 1 g in dextrose 5 % 50 mL IVPB     1 g 100 mL/hr over 30 Minutes Intravenous Every 12 hours 11/25/16 2223     11/25/16 1730  piperacillin-tazobactam (ZOSYN) IVPB 3.375 g     3.375 g 100 mL/hr over 30 Minutes Intravenous  Once 11/25/16 1719 11/25/16 1844         Objective:   Vitals:   11/25/16 2138 11/25/16 2145 11/25/16 2223 11/26/16 0528  BP: 127/60  (!) 125/95 (!) 97/41  Pulse: 93  86 98  Resp: 18  18 18   Temp:  98.5 F (36.9 C) 98.4 F (36.9 C) 100 F (37.8 C)  TempSrc:  Oral Oral Oral  SpO2: 100%  100% 100%  Weight:   72.2 kg (159 lb 2.8 oz)   Height:   6' (1.829 m)     Wt Readings from Last 3 Encounters:  11/25/16 72.2 kg (159 lb 2.8 oz)  11/23/16 71.5 kg (157 lb 9.6 oz)  10/26/16 71.1 kg (156 lb 12.8 oz)     Intake/Output Summary (Last 24 hours) at 11/26/16 1034 Last data filed at 11/26/16 0347  Gross per  24 hour  Intake              170 ml  Output                0 ml  Net              170 ml     Physical Exam  Awake Alert, Oriented X 3, No new F.N deficits, Normal affect [place].AT,PERRAL Supple Neck,No JVD, No cervical lymphadenopathy appriciated.  Symmetrical Chest wall movement, Good air movement bilaterally, CTAB RRR,No Gallops,Rubs or new Murmurs, No Parasternal Heave +ve B.Sounds, Abd Soft, No tenderness, No organomegaly appriciated, No rebound - guarding or rigidity. No Cyanosis, Clubbing or edema, No new Rash or bruise       Data Review:    CBC  Recent Labs Lab 11/23/16 0848 11/25/16 1555 11/26/16 0647  WBC 0.9* 1.1* 1.2*  HGB 11.1* 11.7* 9.8*  HCT 31.7* 33.1* 28.1*  PLT 122* 95* 91*  MCV 90.6 90.2 90.1  MCH 31.7 31.9 31.4  MCHC 35.0 35.3 34.9  RDW 13.9 14.0 14.0  LYMPHSABS 0.6* 0.4*  --   MONOABS 0.3 0.7  --   EOSABS 0.0 0.0  --   BASOSABS 0.0 0.0  --     Chemistries   Recent Labs Lab 11/23/16 0848 11/25/16 1555  NA 134* 130*  K 4.2 4.0  CL 102 97*  CO2 26 26  GLUCOSE 111* 121*  BUN 18 22*  CREATININE 1.22 1.40*  CALCIUM 9.0 8.8*  AST 19 16  ALT 14* 13*  ALKPHOS 67 61  BILITOT 0.5 0.6   ------------------------------------------------------------------------------------------------------------------ No results for input(s): CHOL, HDL, LDLCALC, TRIG, CHOLHDL, LDLDIRECT in the last 72 hours.  No results found for: HGBA1C ------------------------------------------------------------------------------------------------------------------ No results for input(s): TSH, T4TOTAL, T3FREE, THYROIDAB in the last 72 hours.  Invalid input(s): FREET3 ------------------------------------------------------------------------------------------------------------------ No results for input(s): VITAMINB12, FOLATE, FERRITIN, TIBC, IRON, RETICCTPCT in the last 72 hours.  Coagulation profile  Recent Labs Lab 11/25/16 1555  INR 1.08    No results for input(s):  DDIMER in the last 72 hours.  Cardiac Enzymes No results for input(s): CKMB, TROPONINI, MYOGLOBIN in the last 168 hours.  Invalid input(s): CK ------------------------------------------------------------------------------------------------------------------ No results found for: BNP  Micro Results Recent Results (from the past 240 hour(s))  Culture, blood (Routine x 2)     Status: None (Preliminary result)   Collection Time: 11/25/16  3:47 PM  Result Value Ref Range Status   Specimen Description RIGHT ANTECUBITAL  Final   Special Requests BOTTLES DRAWN AEROBIC AND ANAEROBIC 6CC  Final   Culture PENDING  Incomplete   Report Status PENDING  Incomplete  Culture, blood (Routine x 2)     Status: None (Preliminary result)   Collection Time: 11/25/16  3:55 PM  Result Value Ref Range Status   Specimen Description PORTA CATH  Final   Special Requests BOTTLES DRAWN AEROBIC AND ANAEROBIC [facility]  Final   Culture PENDING  Incomplete   Report Status PENDING  Incomplete    Radiology Reports Dg Chest 2 View  Result Date: 11/25/2016 CLINICAL DATA:  Weakness, nonproductive cough and frequent urination. EXAM: CHEST  2 VIEW COMPARISON:  08/25/2016 FINDINGS: Injectable port terminates in the expected location of distal superior vena cava, unchanged. Cardiomediastinal silhouette is normal. Mediastinal contours appear intact. There is no evidence of focal airspace consolidation, pleural effusion or pneumothorax. Osseous structures are without acute abnormality. Soft tissues are grossly normal. IMPRESSION: No active cardiopulmonary disease. Electronically Signed   By: Fidela Salisbury M.D.   On: 11/25/2016 15:43    Time Spent in minutes  30   SINGH,PRASHANT K M.D on 11/26/2016 at 10:34 AM  Between 7am to 7pm - Pager - [phone]  After 7pm go to [email or web address] - password [facility]  [facility] -  Office  [phone]

## 2016-11-27 DIAGNOSIS — R5081 Fever presenting with conditions classified elsewhere: Secondary | ICD-10-CM

## 2016-11-27 DIAGNOSIS — D709 Neutropenia, unspecified: Principal | ICD-10-CM

## 2016-11-27 LAB — CBC WITH DIFFERENTIAL/PLATELET
BASOS PCT: 0 %
Basophils Absolute: 0 10*3/uL (ref 0.0–0.1)
EOS ABS: 0.1 10*3/uL (ref 0.0–0.7)
EOS PCT: 4 %
HCT: 28.1 % — ABNORMAL LOW (ref 39.0–52.0)
HEMOGLOBIN: 9.7 g/dL — AB (ref 13.0–17.0)
Lymphocytes Relative: 32 %
Lymphs Abs: 0.5 10*3/uL — ABNORMAL LOW (ref 0.7–4.0)
MCH: 30.8 pg (ref 26.0–34.0)
MCHC: 34.5 g/dL (ref 30.0–36.0)
MCV: 89.2 fL (ref 78.0–100.0)
MONO ABS: 0.8 10*3/uL (ref 0.1–1.0)
MONOS PCT: 46 %
Neutro Abs: 0.3 10*3/uL — ABNORMAL LOW (ref 1.7–7.7)
Neutrophils Relative %: 17 %
PLATELETS: 85 10*3/uL — AB (ref 150–400)
RBC: 3.15 MIL/uL — ABNORMAL LOW (ref 4.22–5.81)
RDW: 13.9 % (ref 11.5–15.5)
WBC: 1.7 10*3/uL — ABNORMAL LOW (ref 4.0–10.5)

## 2016-11-27 LAB — URINE CULTURE: CULTURE: NO GROWTH

## 2016-11-27 LAB — BASIC METABOLIC PANEL
Anion gap: 8 (ref 5–15)
BUN: 18 mg/dL (ref 6–20)
CALCIUM: 8.5 mg/dL — AB (ref 8.9–10.3)
CHLORIDE: 106 mmol/L (ref 101–111)
CO2: 21 mmol/L — ABNORMAL LOW (ref 22–32)
CREATININE: 1.15 mg/dL (ref 0.61–1.24)
GFR calc Af Amer: >60 mL/min (ref >60)
GFR calc non Af Amer: 57 mL/min — ABNORMAL LOW (ref >60)
Glucose, Bld: 94 mg/dL (ref 65–99)
Potassium: 2.9 mmol/L — ABNORMAL LOW (ref 3.5–5.1)
Sodium: 135 mmol/L (ref 135–145)

## 2016-11-27 MED ORDER — LOPERAMIDE HCL 2 MG PO CAPS
2.0000 mg | ORAL_CAPSULE | ORAL | Status: DC | PRN
  Administered 2016-11-27 – 2016-11-28 (×5): 2 mg via ORAL
  Filled 2016-11-27 (×5): qty 1

## 2016-11-27 MED ORDER — MAGNESIUM SULFATE 2 GM/50ML IV SOLN
2.0000 g | Freq: Once | INTRAVENOUS | Status: AC
Start: 2016-11-27 — End: 2016-11-27
  Administered 2016-11-27: 2 g via INTRAVENOUS
  Filled 2016-11-27: qty 50

## 2016-11-27 MED ORDER — SODIUM CHLORIDE 0.9 % IV SOLN
INTRAVENOUS | Status: AC
  Administered 2016-11-27: 20:00:00 via INTRAVENOUS

## 2016-11-27 MED ORDER — LOPERAMIDE HCL 2 MG PO CAPS: 2.0000 mg | ORAL_CAPSULE | Freq: Four times a day (QID) | ORAL | Status: DC | PRN

## 2016-11-27 MED ORDER — POTASSIUM CHLORIDE 10 MEQ/100ML IV SOLN
INTRAVENOUS | Status: AC
  Administered 2016-11-27: 10 meq via INTRAVENOUS
  Filled 2016-11-27: qty 100

## 2016-11-27 MED ORDER — POTASSIUM CHLORIDE CRYS ER 20 MEQ PO TBCR
40.0000 meq | EXTENDED_RELEASE_TABLET | Freq: Four times a day (QID) | ORAL | Status: AC
  Administered 2016-11-27 (×2): 40 meq via ORAL
  Filled 2016-11-27 (×2): qty 2

## 2016-11-27 MED ORDER — POTASSIUM CHLORIDE 10 MEQ/100ML IV SOLN
10.0000 meq | INTRAVENOUS | Status: AC
  Administered 2016-11-27 (×4): 10 meq via INTRAVENOUS
  Filled 2016-11-27 (×2): qty 100

## 2016-11-27 NOTE — Progress Notes (Signed)
PROGRESS NOTE                                                                                                                                                                                                             Patient Demographics:    Paul Keith, is a 83 y.o. male, DOB - 06-12-1930, VPX:[id number]  Admit date - 11/25/2016   Admitting Physician Orvan Falconer, MD  Outpatient Primary MD for the patient is CYNTHIA BUTLER, DO  LOS - 2  Chief Complaint  Patient presents with  . Fever       Brief Narrative   Paul Keith is an 83 y.o. male with hx of NHL B cell, mixed plasma cell lymphoma,  undergoing chemotherapy, with known pancytopenia WBC of 0.9, received neulasta 2 days ago, hx of anemia and diverticulosis, found to have temp of 102, and presented to the ER.  He has a port a cath, but denied pain, erythema, chills, coughs, GI or GU symptoms.  Work up in the ER showed WBC of 1.1 with 1% neutrophils, clear CXR and UA pending.  His Cr was elevated at 1.4.  He has no rash.  He was given IV Zosyn and hospitalist was asked to admit him for febrile neutropenia.  He has no hypotension and his lactic acid is normal.  He was pan cultured.   Subjective:    Paul Keith today has, No headache, No chest pain, No abdominal pain - No Nausea, No new weakness tingling or numbness, No Cough - SOB.    Assessment  & Plan :     1.Febrile neutropenia. No clear source, appears nontoxic, continue gentle hydration, continue IV MaxipimeAlong with Flagyl as he has diarrhea, follow blood and stool culture results. C. difficile and Flu negative.  2. History of malignant lymphoplasmacytic lymphoma, IgM monoclonal gammopathy of uncertain significance - undergoing chemotherapy, received Neulasta few days ago, if stable follow with heme on outpatient as before.  3. Chemotherapy-related pancytopenia. Supportive care and treatment as  above.  4. ARF due to dehydration. The function improved with IV fluids.  5. Diarrhea. Ruled out C. difficile. Stool cultures pending, added Imodium when necessary, continue Flagyl as he is immunocompromised and neutropenic.  6. Hypokalemia-  from GI losses. Replace and monitor    Family Communication  :  None  Code Status :  Full  Diet : Diet regular Room service appropriate? Yes; Fluid consistency: Thin   Disposition Plan  :  Home 1-2 days  Consults  :    Procedures  :  None  DVT Prophylaxis  :   SCDs    Lab Results  Component Value Date   PLT 85 (L) 11/27/2016    Inpatient Medications  Scheduled Meds: . acyclovir  400 mg Oral Daily  . B-complex with vitamin C  1 tablet Oral Daily  . ceFEPime (MAXIPIME) IV  1 g Intravenous Q12H  . filgrastim  480 mcg Subcutaneous Daily  . magnesium sulfate 1 - 4 g bolus IVPB  2 g Intravenous Once  . potassium chloride  10 mEq Intravenous Q1 Hr x 4  . potassium chloride  40 mEq Oral Q6H  . sodium chloride flush  3 mL Intravenous Q12H  . tamsulosin  0.8 mg Oral QPC supper   Continuous Infusions: . sodium chloride     PRN Meds:.acetaminophen, loperamide  Antibiotics  :    Anti-infectives    Start     Dose/Rate Route Frequency Ordered Stop   11/26/16 1000  acyclovir (ZOVIRAX) tablet 400 mg  Status:  Discontinued     400 mg Oral Daily 11/25/16 2223 11/26/16 0747   11/26/16 1000  acyclovir (ZOVIRAX) 200 MG capsule 400 mg     400 mg Oral Daily 11/26/16 0747     11/25/16 2300  ceFEPIme (MAXIPIME) 1 g in dextrose 5 % 50 mL IVPB     1 g 100 mL/hr over 30 Minutes Intravenous Every 12 hours 11/25/16 2223     11/25/16 1730  piperacillin-tazobactam (ZOSYN) IVPB 3.375 g     3.375 g 100 mL/hr over 30 Minutes Intravenous  Once 11/25/16 1719 11/25/16 1844         Objective:   Vitals:   11/26/16 0528 11/26/16 1258 11/26/16 2033 11/27/16 0658  BP: (!) 97/41  (!) 103/39 (!) 101/46  Pulse: 98  89 79  Resp: 18  18 18   Temp: 100 F  (37.8 C) 99.2 F (37.3 C) 99.1 F (37.3 C) 97.8 F (36.6 C)  TempSrc: Oral Oral Oral Oral  SpO2: 100%  100% 100%  Weight:      Height:        Wt Readings from Last 3 Encounters:  11/25/16 72.2 kg (159 lb 2.8 oz)  11/23/16 71.5 kg (157 lb 9.6 oz)  10/26/16 71.1 kg (156 lb 12.8 oz)     Intake/Output Summary (Last 24 hours) at 11/27/16 1041 Last data filed at 11/27/16 0900  Gross per 24 hour  Intake             2300 ml  Output              100 ml  Net             2200 ml     Physical Exam  Awake Alert, Oriented X 3, No new F.N deficits, Normal affect [place].AT,PERRAL Supple Neck,No JVD, No cervical lymphadenopathy appriciated.  Symmetrical Chest wall movement, Good air movement bilaterally, CTAB RRR,No Gallops,Rubs or new Murmurs, No Parasternal Heave +ve B.Sounds, Abd Soft, No tenderness, No organomegaly appriciated, No rebound - guarding or rigidity. No Cyanosis, Clubbing or edema, No new Rash or bruise       Data Review:    CBC  Recent Labs Lab 11/23/16 0848 11/25/16 1555 11/26/16 0647 11/27/16 0645  WBC 0.9* 1.1* 1.2* 1.7*  HGB 11.1* 11.7* 9.8*  9.7*  HCT 31.7* 33.1* 28.1* 28.1*  PLT 122* 95* 91* 85*  MCV 90.6 90.2 90.1 89.2  MCH 31.7 31.9 31.4 30.8  MCHC 35.0 35.3 34.9 34.5  RDW 13.9 14.0 14.0 13.9  LYMPHSABS 0.6* 0.4* 0.5* 0.5*  MONOABS 0.3 0.7 0.6 0.8  EOSABS 0.0 0.0 0.0 0.1  BASOSABS 0.0 0.0 0.0 0.0    Chemistries   Recent Labs Lab 11/23/16 0848 11/25/16 1555 11/27/16 0645  NA 134* 130* 135  K 4.2 4.0 2.9*  CL 102 97* 106  CO2 26 26 21*  GLUCOSE 111* 121* 94  BUN 18 22* 18  CREATININE 1.22 1.40* 1.15  CALCIUM 9.0 8.8* 8.5*  AST 19 16  --   ALT 14* 13*  --   ALKPHOS 67 61  --   BILITOT 0.5 0.6  --    ------------------------------------------------------------------------------------------------------------------ No results for input(s): CHOL, HDL, LDLCALC, TRIG, CHOLHDL, LDLDIRECT in the last 72 hours.  No results found for:  HGBA1C ------------------------------------------------------------------------------------------------------------------ No results for input(s): TSH, T4TOTAL, T3FREE, THYROIDAB in the last 72 hours.  Invalid input(s): FREET3 ------------------------------------------------------------------------------------------------------------------ No results for input(s): VITAMINB12, FOLATE, FERRITIN, TIBC, IRON, RETICCTPCT in the last 72 hours.  Coagulation profile  Recent Labs Lab 11/25/16 1555  INR 1.08    No results for input(s): DDIMER in the last 72 hours.  Cardiac Enzymes No results for input(s): CKMB, TROPONINI, MYOGLOBIN in the last 168 hours.  Invalid input(s): CK ------------------------------------------------------------------------------------------------------------------ No results found for: BNP  Micro Results Recent Results (from the past 240 hour(s))  Urine culture     Status: None   Collection Time: 11/25/16  3:26 PM  Result Value Ref Range Status   Specimen Description URINE, CLEAN CATCH  Final   Special Requests NONE  Final   Culture NO GROWTH Performed at [facility]   Final   Report Status 11/27/2016 FINAL  Final  Culture, blood (Routine x 2)     Status: None (Preliminary result)   Collection Time: 11/25/16  3:47 PM  Result Value Ref Range Status   Specimen Description RIGHT ANTECUBITAL  Final   Special Requests BOTTLES DRAWN AEROBIC AND ANAEROBIC 6CC  Final   Culture PENDING  Incomplete   Report Status PENDING  Incomplete  Culture, blood (Routine x 2)     Status: None (Preliminary result)   Collection Time: 11/25/16  3:55 PM  Result Value Ref Range Status   Specimen Description PORTA CATH  Final   Special Requests BOTTLES DRAWN AEROBIC AND ANAEROBIC 6CC  Final   Culture PENDING  Incomplete   Report Status PENDING  Incomplete  C difficile quick scan w PCR reflex     Status: None   Collection Time: 11/26/16 10:49 AM  Result Value Ref Range  Status   C Diff antigen NEGATIVE NEGATIVE Final   C Diff toxin NEGATIVE NEGATIVE Final   C Diff interpretation No C. difficile detected.  Final    Radiology Reports Dg Chest 2 View  Result Date: 11/25/2016 CLINICAL DATA:  Weakness, nonproductive cough and frequent urination. EXAM: CHEST  2 VIEW COMPARISON:  08/25/2016 FINDINGS: Injectable port terminates in the expected location of distal superior vena cava, unchanged. Cardiomediastinal silhouette is normal. Mediastinal contours appear intact. There is no evidence of focal airspace consolidation, pleural effusion or pneumothorax. Osseous structures are without acute abnormality. Soft tissues are grossly normal. IMPRESSION: No active cardiopulmonary disease. Electronically Signed   By: Fidela Salisbury M.D.   On: 11/25/2016 15:43    Time Spent in  minutes  30   Lala Lund K M.D on 11/27/2016 at 10:41 AM  Between 7am to 7pm - Pager - [phone]  After 7pm go to [email or web address] - password [facility]  [facility] -  Office  [phone]

## 2016-11-28 LAB — CBC WITH DIFFERENTIAL/PLATELET
BASOS ABS: 0 10*3/uL (ref 0.0–0.1)
BASOS PCT: 0 %
EOS PCT: 7 %
Eosinophils Absolute: 0.2 10*3/uL (ref 0.0–0.7)
HCT: 26.1 % — ABNORMAL LOW (ref 39.0–52.0)
Hemoglobin: 9 g/dL — ABNORMAL LOW (ref 13.0–17.0)
LYMPHS PCT: 21 %
Lymphs Abs: 0.5 10*3/uL — ABNORMAL LOW (ref 0.7–4.0)
MCH: 30.9 pg (ref 26.0–34.0)
MCHC: 34.5 g/dL (ref 30.0–36.0)
MCV: 89.7 fL (ref 78.0–100.0)
MONO ABS: 0.9 10*3/uL (ref 0.1–1.0)
MONOS PCT: 35 %
Neutro Abs: 0.9 10*3/uL — ABNORMAL LOW (ref 1.7–7.7)
Neutrophils Relative %: 37 %
PLATELETS: 90 10*3/uL — AB (ref 150–400)
RBC: 2.91 MIL/uL — ABNORMAL LOW (ref 4.22–5.81)
RDW: 13.9 % (ref 11.5–15.5)
WBC: 2.5 10*3/uL — ABNORMAL LOW (ref 4.0–10.5)

## 2016-11-28 LAB — BASIC METABOLIC PANEL
Anion gap: 5 (ref 5–15)
BUN: 12 mg/dL (ref 6–20)
CALCIUM: 8 mg/dL — AB (ref 8.9–10.3)
CO2: 20 mmol/L — AB (ref 22–32)
CREATININE: 1.07 mg/dL (ref 0.61–1.24)
Chloride: 110 mmol/L (ref 101–111)
GFR calc non Af Amer: >60 mL/min (ref >60)
GLUCOSE: 91 mg/dL (ref 65–99)
Potassium: 3.8 mmol/L (ref 3.5–5.1)
Sodium: 135 mmol/L (ref 135–145)

## 2016-11-28 LAB — MAGNESIUM: Magnesium: 1.9 mg/dL (ref 1.7–2.4)

## 2016-11-28 MED ORDER — LOPERAMIDE HCL 2 MG PO CAPS: 2.0000 mg | ORAL_CAPSULE | ORAL | 0 refills | Status: DC | PRN

## 2016-11-28 MED ORDER — METRONIDAZOLE 500 MG PO TABS: 500.0000 mg | ORAL_TABLET | Freq: Three times a day (TID) | ORAL | 0 refills | Status: DC

## 2016-11-28 MED ORDER — MAGNESIUM SULFATE 2 GM/50ML IV SOLN
2.0000 g | Freq: Once | INTRAVENOUS | Status: AC
  Administered 2016-11-28: 2 g via INTRAVENOUS
  Filled 2016-11-28: qty 50

## 2016-11-28 MED ORDER — SODIUM CHLORIDE 0.9 % IV BOLUS (SEPSIS): 500.0000 mL | Freq: Once | INTRAVENOUS | Status: DC

## 2016-11-28 MED ORDER — CEFDINIR 300 MG PO CAPS: 300.0000 mg | ORAL_CAPSULE | Freq: Two times a day (BID) | ORAL | 0 refills | Status: DC

## 2016-11-28 MED ORDER — POTASSIUM CHLORIDE CRYS ER 20 MEQ PO TBCR
40.0000 meq | EXTENDED_RELEASE_TABLET | Freq: Once | ORAL | Status: AC
  Administered 2016-11-28: 40 meq via ORAL
  Filled 2016-11-28: qty 2

## 2016-11-28 NOTE — Discharge Instructions (Signed)
Follow with Primary MD CYNTHIA BUTLER, DO in 7 days  ° °Get CBC, CMP, 2 view Chest X ray checked  by Primary MD or SNF MD in 5-7 days ( we routinely change or add medications that can affect your baseline labs and fluid status, therefore we recommend that you get the mentioned basic workup next visit with your PCP, your PCP may decide not to get them or add new tests based on their clinical decision) ° °Activity: As tolerated with Full fall precautions use walker/cane & assistance as needed ° °Disposition Home   ° °Diet:    Heart Healthy   ° °For Heart failure patients - Check your Weight same time everyday, if you gain over 2 pounds, or you develop in leg swelling, experience more shortness of breath or chest pain, call your Primary MD immediately. Follow Cardiac Low Salt Diet and 1.5 lit/day fluid restriction. ° °On your next visit with your primary care physician please Get Medicines reviewed and adjusted. ° °Please request your Prim.MD to go over all Hospital Tests and Procedure/Radiological results at the follow up, please get all Hospital records sent to your Prim MD by signing hospital release before you go home. ° °If you experience worsening of your admission symptoms, develop shortness of breath, life threatening emergency, suicidal or homicidal thoughts you must seek medical attention immediately by calling 911 or calling your MD immediately  if symptoms less severe. ° °You Must read complete instructions/literature along with all the possible adverse reactions/side effects for all the Medicines you take and that have been prescribed to you. Take any new Medicines after you have completely understood and accpet all the possible adverse reactions/side effects.  ° °Do not drive, operate heavy machinery, perform activities at heights, swimming or participation in water activities or provide baby sitting services if your were admitted for syncope or siezures until you have seen by Primary MD or a Neurologist  and advised to do so again. ° °Do not drive when taking Pain medications.  ° ° °Do not take more than prescribed Pain, Sleep and Anxiety Medications ° °Special Instructions: If you have smoked or chewed Tobacco  in the last 2 yrs please stop smoking, stop any regular Alcohol  and or any Recreational drug use. ° °Wear Seat belts while driving. ° ° °Please note ° °You were cared for by a hospitalist during your hospital stay. If you have any questions about your discharge medications or the care you received while you were in the hospital after you are discharged, you can call the unit and asked to speak with the hospitalist on call if the hospitalist that took care of you is not available. Once you are discharged, your primary care physician will handle any further medical issues. Please note that NO REFILLS for any discharge medications will be authorized once you are discharged, as it is imperative that you return to your primary care physician (or establish a relationship with a primary care physician if you do not have one) for your aftercare needs so that they can reassess your need for medications and monitor your lab values. ° °

## 2016-11-28 NOTE — Progress Notes (Signed)
Pt port deaccessed. Site clean and dry. D/C instructions given to pt. Verbalized understanding. Pt wife here to transport home.

## 2016-11-28 NOTE — Discharge Summary (Signed)
Paul Keith ELT:[id number] DOB: 1929/12/10 DOA: 11/25/2016  PCP: Octavio Graves, DO  Admit date: 11/25/2016  Discharge date: 11/28/2016  Admitted From: Home   Disposition:  Home   Recommendations for Outpatient Follow-up:   Follow up with PCP in 1-2 weeks  PCP Please obtain BMP/CBC, 2 view CXR in 1week,  (see Discharge instructions)   PCP Please follow up on the following pending results: None   Home Health: None   Equipment/Devices: None  Consultations: None Discharge Condition: Fair   CODE STATUS: Full   Diet Recommendation:  Heart Healthy    Chief Complaint  Patient presents with  . Fever     Brief history of present illness from the day of admission and additional interim summary    Paul Keith an 83 y.o.malewith hx of NHL B cell, mixed plasma cell lymphoma, undergoing chemotherapy, with known pancytopenia WBC of 0.9, received neulasta 2 days ago, hx of anemia and diverticulosis, found to have temp of 102, and presented to the ER. He has a port a cath, but denied pain, erythema, chills, coughs, GI or GU symptoms. Work up in the ER showed WBC of 1.1 with 1% neutrophils, clear CXR and UA pending. His Cr was elevated at 1.4. He has no rash. He was given IV Zosyn and hospitalist was asked to admit him for febrile neutropenia. He has no hypotension and his lactic acid is normal. He was pan cultured.  Hospital issues addressed     1.Febrile neutropenia. With gastroenteritis, appeared nontoxic, flu negative, C. difficile negative, placed on meropenem, Flagyl along with Imodium and IV fluids, much improved this morning no abdominal pain feels much better, will be given third-generation cephalosporin and oral Flagyl for 4 more days and discharged home, requested to keep himself hydrated, will  follow with PCP and oncology within a week. Blood urine cultures unremarkable.  2. History of malignant lymphoplasmacytic lymphoma, IgM monoclonal gammopathy of uncertain significance - undergoing chemotherapy, received Neulasta few days ago, if stable follow with heme on outpatient as before.  3. Chemotherapy-related pancytopenia. Supportive care and treatment as above.  4. ARF due to dehydration. Resolved after hydration.  5. Diarrhea. Ruled out C. difficile. Stool cultures pending, added Imodium when necessary, continue Flagyl and cephalosporin for more days as he is immunocompromised and neutropenic.  6. Hypokalemia-  from GI losses. Replaced and stable.   Discharge diagnosis     Active Problems:   Anemia   Pancytopenia (HCC)   IgM monoclonal gammopathy of uncertain significance   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HCC)   At high risk for infection due to neutropenia ([place])   Neutropenic fever (HCC)    Discharge instructions    Discharge Instructions    Diet - low sodium heart healthy    Complete by:  As directed    Discharge instructions    Complete by:  As directed    Follow with Primary MD CYNTHIA BUTLER, DO in 7 days   Get CBC, CMP, 2 view Chest X ray checked  by Primary MD  or SNF MD in 5-7 days ( we routinely change or add medications that can affect your baseline labs and fluid status, therefore we recommend that you get the mentioned basic workup next visit with your PCP, your PCP may decide not to get them or add new tests based on their clinical decision)   Activity: As tolerated with Full fall precautions use walker/cane & assistance as needed   Disposition Home     Diet: Heart Healthy   For Heart failure patients - Check your Weight same time everyday, if you gain over 2 pounds, or you develop in leg swelling, experience more shortness of breath or chest pain, call your Primary MD immediately. Follow Cardiac Low Salt Diet and 1.5 lit/day fluid  restriction.   On your next visit with your primary care physician please Get Medicines reviewed and adjusted.   Please request your Prim.MD to go over all Hospital Tests and Procedure/Radiological results at the follow up, please get all Hospital records sent to your Prim MD by signing hospital release before you go home.   If you experience worsening of your admission symptoms, develop shortness of breath, life threatening emergency, suicidal or homicidal thoughts you must seek medical attention immediately by calling 911 or calling your MD immediately  if symptoms less severe.  You Must read complete instructions/literature along with all the possible adverse reactions/side effects for all the Medicines you take and that have been prescribed to you. Take any new Medicines after you have completely understood and accpet all the possible adverse reactions/side effects.   Do not drive, operate heavy machinery, perform activities at heights, swimming or participation in water activities or provide baby sitting services if your were admitted for syncope or siezures until you have seen by Primary MD or a Neurologist and advised to do so again.  Do not drive when taking Pain medications.    Do not take more than prescribed Pain, Sleep and Anxiety Medications  Special Instructions: If you have smoked or chewed Tobacco  in the last 2 yrs please stop smoking, stop any regular Alcohol  and or any Recreational drug use.  Wear Seat belts while driving.   Please note  You were cared for by a hospitalist during your hospital stay. If you have any questions about your discharge medications or the care you received while you were in the hospital after you are discharged, you can call the unit and asked to speak with the hospitalist on call if the hospitalist that took care of you is not available. Once you are discharged, your primary care physician will handle any further medical issues. Please note  that NO REFILLS for any discharge medications will be authorized once you are discharged, as it is imperative that you return to your primary care physician (or establish a relationship with a primary care physician if you do not have one) for your aftercare needs so that they can reassess your need for medications and monitor your lab values.   Increase activity slowly    Complete by:  As directed       Discharge Medications   Allergies as of 11/28/2016   No Known Allergies     Medication List    TAKE these medications   acetaminophen 500 MG tablet Commonly known as:  TYLENOL Take 500 mg by mouth every 6 (six) hours as needed for mild pain.   acyclovir 400 MG tablet Commonly known as:  ZOVIRAX Take 1 tablet (400 mg total) by  mouth daily.   B-12 PO Take 1 tablet by mouth daily.   B-complex with vitamin C tablet Take 1 tablet by mouth daily.   bendamustine in sodium chloride 0.9 % 50 mL Inject into the vein once. Every 28 days   cefdinir 300 MG capsule Commonly known as:  OMNICEF Take 1 capsule (300 mg total) by mouth 2 (two) times daily.   cholecalciferol 1000 units tablet Commonly known as:  VITAMIN D Take 1,000 Units by mouth daily.   cyanocobalamin 100 MCG tablet Take by mouth daily. Pt reports unsure of dosage, takes once daily   dexamethasone 4 MG tablet Commonly known as:  DECADRON Take 2 tablets (8 mg total) by mouth daily. Start the day after bendamustine chemotherapy for 2 days. Take with food.   loperamide 2 MG capsule Commonly known as:  IMODIUM Take 1 capsule (2 mg total) by mouth every 4 (four) hours as needed for diarrhea or loose stools.   meclizine 32 MG tablet Commonly known as:  ANTIVERT Take 1 tablet (32 mg total) by mouth 3 (three) times daily as needed.   metroNIDAZOLE 500 MG tablet Commonly known as:  FLAGYL Take 1 tablet (500 mg total) by mouth 3 (three) times daily.   multivitamin with minerals Tabs tablet Take 1 tablet by mouth  daily.   ondansetron 8 MG tablet Commonly known as:  ZOFRAN Take 1 tablet (8 mg total) by mouth 2 (two) times daily as needed for nausea or vomiting. Start on day 2 after bendamustine.   prochlorperazine 10 MG tablet Commonly known as:  COMPAZINE Take 1 tablet (10 mg total) by mouth every 6 (six) hours as needed (Nausea or vomiting).   RITUXAN IV Inject into the vein as directed.   tamsulosin 0.4 MG Caps capsule Commonly known as:  FLOMAX Take 0.8 mg by mouth daily after supper.   traMADol 50 MG tablet Commonly known as:  ULTRAM Take 1 tablet (50 mg total) by mouth every 6 (six) hours as needed.       Follow-up Information    CYNTHIA BUTLER, DO. Schedule an appointment as soon as possible for a visit in 1 week(s).   Why:  And your oncologist within a week Contact information: [address] [place] [address] [phone]           Major procedures and Radiology Reports - PLEASE review detailed and final reports thoroughly  -         Dg Chest 2 View  Result Date: 11/25/2016 CLINICAL DATA:  Weakness, nonproductive cough and frequent urination. EXAM: CHEST  2 VIEW COMPARISON:  08/25/2016 FINDINGS: Injectable port terminates in the expected location of distal superior vena cava, unchanged. Cardiomediastinal silhouette is normal. Mediastinal contours appear intact. There is no evidence of focal airspace consolidation, pleural effusion or pneumothorax. Osseous structures are without acute abnormality. Soft tissues are grossly normal. IMPRESSION: No active cardiopulmonary disease. Electronically Signed   By: Fidela Salisbury M.D.   On: 11/25/2016 15:43    Micro Results     Recent Results (from the past 240 hour(s))  Urine culture     Status: None   Collection Time: 11/25/16  3:26 PM  Result Value Ref Range Status   Specimen Description URINE, CLEAN CATCH  Final   Special Requests NONE  Final   Culture NO GROWTH Performed at [facility]   Final    Report Status 11/27/2016 FINAL  Final  Culture, blood (Routine x 2)  Status: None (Preliminary result)   Collection Time: 11/25/16  3:47 PM  Result Value Ref Range Status   Specimen Description RIGHT ANTECUBITAL  Final   Special Requests BOTTLES DRAWN AEROBIC AND ANAEROBIC 6CC  Final   Culture NO GROWTH 3 DAYS  Final   Report Status PENDING  Incomplete  Culture, blood (Routine x 2)     Status: None (Preliminary result)   Collection Time: 11/25/16  3:55 PM  Result Value Ref Range Status   Specimen Description PORTA CATH  Final   Special Requests BOTTLES DRAWN AEROBIC AND ANAEROBIC 6CC  Final   Culture NO GROWTH 3 DAYS  Final   Report Status PENDING  Incomplete  C difficile quick scan w PCR reflex     Status: None   Collection Time: 11/26/16 10:49 AM  Result Value Ref Range Status   C Diff antigen NEGATIVE NEGATIVE Final   C Diff toxin NEGATIVE NEGATIVE Final   C Diff interpretation No C. difficile detected.  Final    Today   Subjective    Paul Keith today has no headache,no chest abdominal pain,no new weakness tingling or numbness, feels much better wants to go home today.     Objective   Blood pressure (!) 116/55, pulse 68, temperature 98.2 F (36.8 C), temperature source Oral, resp. rate 18, height 6' (1.829 m), weight 72.2 kg (159 lb 2.8 oz), SpO2 100 %.   Intake/Output Summary (Last 24 hours) at 11/28/16 0942 Last data filed at 11/28/16 0900  Gross per 24 hour  Intake             2995 ml  Output              950 ml  Net             2045 ml    Exam Awake Alert, Oriented x 3, No new F.N deficits, Normal affect [place].AT,PERRAL Supple Neck,No JVD, No cervical lymphadenopathy appriciated.  Symmetrical Chest wall movement, Good air movement bilaterally, CTAB RRR,No Gallops,Rubs or new Murmurs, No Parasternal Heave +ve B.Sounds, Abd Soft, Non tender, No organomegaly appriciated, No rebound -guarding or rigidity. No Cyanosis, Clubbing or edema, No new Rash or  bruise   Data Review   CBC w Diff: Lab Results  Component Value Date   WBC 2.5 (L) 11/28/2016   HGB 9.0 (L) 11/28/2016   HCT 26.1 (L) 11/28/2016   PLT 90 (L) 11/28/2016   LYMPHOPCT 21 11/28/2016   BANDSPCT 0 11/25/2016   MONOPCT 35 11/28/2016   EOSPCT 7 11/28/2016   BASOPCT 0 11/28/2016    CMP: Lab Results  Component Value Date   NA 135 11/28/2016   K 3.8 11/28/2016   CL 110 11/28/2016   CO2 20 (L) 11/28/2016   BUN 12 11/28/2016   CREATININE 1.07 11/28/2016   PROT 6.0 (L) 11/25/2016   ALBUMIN 3.7 11/25/2016   BILITOT 0.6 11/25/2016   ALKPHOS 61 11/25/2016   AST 16 11/25/2016   ALT 13 (L) 11/25/2016  .   Total Time in preparing paper work, data evaluation and todays exam - 35 minutes  Thurnell Lose M.D on 11/28/2016 at 9:42 AM  [facility]   Office  [phone]

## 2016-11-30 ENCOUNTER — Encounter (HOSPITAL_BASED_OUTPATIENT_CLINIC_OR_DEPARTMENT_OTHER): Payer: Medicare Other

## 2016-11-30 DIAGNOSIS — Z9889 Other specified postprocedural states: Secondary | ICD-10-CM | POA: Diagnosis not present

## 2016-11-30 DIAGNOSIS — Z452 Encounter for adjustment and management of vascular access device: Secondary | ICD-10-CM

## 2016-11-30 DIAGNOSIS — C8300 Small cell B-cell lymphoma, unspecified site: Secondary | ICD-10-CM | POA: Diagnosis not present

## 2016-11-30 DIAGNOSIS — D696 Thrombocytopenia, unspecified: Secondary | ICD-10-CM | POA: Diagnosis not present

## 2016-11-30 DIAGNOSIS — Z87891 Personal history of nicotine dependence: Secondary | ICD-10-CM | POA: Diagnosis not present

## 2016-11-30 DIAGNOSIS — D649 Anemia, unspecified: Secondary | ICD-10-CM | POA: Diagnosis not present

## 2016-11-30 DIAGNOSIS — D479 Neoplasm of uncertain behavior of lymphoid, hematopoietic and related tissue, unspecified: Secondary | ICD-10-CM

## 2016-11-30 DIAGNOSIS — R5383 Other fatigue: Secondary | ICD-10-CM | POA: Diagnosis not present

## 2016-11-30 DIAGNOSIS — Z79899 Other long term (current) drug therapy: Secondary | ICD-10-CM | POA: Diagnosis not present

## 2016-11-30 DIAGNOSIS — D61818 Other pancytopenia: Secondary | ICD-10-CM

## 2016-11-30 LAB — CBC WITH DIFFERENTIAL/PLATELET
BASOS ABS: 0 10*3/uL (ref 0.0–0.1)
Basophils Relative: 0 %
EOS ABS: 0.2 10*3/uL (ref 0.0–0.7)
Eosinophils Relative: 3 %
HCT: 31.4 % — ABNORMAL LOW (ref 39.0–52.0)
Hemoglobin: 11 g/dL — ABNORMAL LOW (ref 13.0–17.0)
LYMPHS ABS: 0.7 10*3/uL (ref 0.7–4.0)
Lymphocytes Relative: 12 %
MCH: 31.3 pg (ref 26.0–34.0)
MCHC: 35 g/dL (ref 30.0–36.0)
MCV: 89.5 fL (ref 78.0–100.0)
Monocytes Absolute: 0.8 10*3/uL (ref 0.1–1.0)
Monocytes Relative: 14 %
NEUTROS ABS: 4.2 10*3/uL (ref 1.7–7.7)
Neutrophils Relative %: 71 %
Platelets: 122 10*3/uL — ABNORMAL LOW (ref 150–400)
RBC: 3.51 MIL/uL — ABNORMAL LOW (ref 4.22–5.81)
RDW: 13.9 % (ref 11.5–15.5)
WBC: 5.9 10*3/uL (ref 4.0–10.5)

## 2016-11-30 LAB — CULTURE, BLOOD (ROUTINE X 2)
Culture: NO GROWTH
Culture: NO GROWTH

## 2016-11-30 LAB — COMPREHENSIVE METABOLIC PANEL
ALBUMIN: 3.3 g/dL — AB (ref 3.5–5.0)
ALT: 15 U/L — ABNORMAL LOW (ref 17–63)
AST: 20 U/L (ref 15–41)
Alkaline Phosphatase: 65 U/L (ref 38–126)
Anion gap: 7 (ref 5–15)
BUN: 13 mg/dL (ref 6–20)
CO2: 24 mmol/L (ref 22–32)
Calcium: 8.8 mg/dL — ABNORMAL LOW (ref 8.9–10.3)
Chloride: 106 mmol/L (ref 101–111)
Creatinine, Ser: 1.08 mg/dL (ref 0.61–1.24)
GFR calc Af Amer: >60 mL/min (ref >60)
Glucose, Bld: 113 mg/dL — ABNORMAL HIGH (ref 65–99)
POTASSIUM: 3.9 mmol/L (ref 3.5–5.1)
Sodium: 137 mmol/L (ref 135–145)
Total Bilirubin: 0.5 mg/dL (ref 0.3–1.2)
Total Protein: 5.6 g/dL — ABNORMAL LOW (ref 6.5–8.1)

## 2016-11-30 MED ORDER — SODIUM CHLORIDE 0.9% FLUSH
10.0000 mL | INTRAVENOUS | Status: DC | PRN
  Administered 2016-11-30: 10 mL via INTRAVENOUS
  Filled 2016-11-30: qty 10

## 2016-11-30 MED ORDER — HEPARIN SOD (PORK) LOCK FLUSH 100 UNIT/ML IV SOLN
INTRAVENOUS | Status: AC
  Filled 2016-11-30: qty 5

## 2016-11-30 MED ORDER — HEPARIN SOD (PORK) LOCK FLUSH 100 UNIT/ML IV SOLN
500.0000 [IU] | Freq: Once | INTRAVENOUS | Status: AC
  Administered 2016-11-30: 500 [IU] via INTRAVENOUS

## 2016-11-30 NOTE — Progress Notes (Signed)
Paul Keith tolerated port lab draw well without complaints or incident. Labs reviewed with Dr. Whitney Muse since pt was just discharged from the hospital on 1/14 and received Neupogen x 3. Chemotherapy held today and rescheduled for next week per MD orders to give him more time to recover. Pt verbalized understanding and port de-accessed after being flushed with 10 ml NS and 5 ml Heparin easily per protocol. Pt discharged self ambulatory in satisfactory condition accompanied by his wife

## 2016-11-30 NOTE — Patient Instructions (Signed)
[place] at [facility] Discharge Instructions  RECOMMENDATIONS MADE BY THE CONSULTANT AND ANY TEST RESULTS WILL BE SENT TO YOUR REFERRING PHYSICIAN.  Portacath flushed with labs drawn today only. Chemo held today. Follow-up as scheduled. Call clinic for any questions or concerns  Thank you for choosing [place] at [facility] to provide your oncology and hematology care.  To afford each patient quality time with our provider, please arrive at least 15 minutes before your scheduled appointment time.    If you have a lab appointment with the [place] please come in thru the  Main Entrance and check in at the main information desk  You need to re-schedule your appointment should you arrive 10 or more minutes late.  We strive to give you quality time with our providers, and arriving late affects you and other patients whose appointments are after yours.  Also, if you no show three or more times for appointments you may be dismissed from the clinic at the providers discretion.     Again, thank you for choosing [facility].  Our hope is that these requests will decrease the amount of time that you wait before being seen by our physicians.       _____________________________________________________________  Should you have questions after your visit to [facility], please contact our office at [phone] between the hours of 8:30 a.m. and 4:30 p.m.  Voicemails left after 4:30 p.m. will not be returned until the following business day.  For prescription refill requests, have your pharmacy contact our office.       Resources For Cancer Patients and their Caregivers ? American Cancer Society: Can assist with transportation, wigs, general needs, runs Look Good Feel Better.        601-841-8255 ? Cancer Care: Provides financial assistance, online support groups, medication/co-pay assistance.  1-800-813-HOPE  (509) 619-6768) ? [place] Assists [place] cancer patients and their families through emotional , educational and financial support.  587-392-1023 ? [place] DSS Where to apply for food stamps, Medicaid and utility assistance. 586-767-2713 ? RCATS: Transportation to medical appointments. (267) 314-4487 ? Social Security Administration: May apply for disability if have a Stage IV cancer. 715-514-9151 (406) 088-1625 ? [employer], Disability and Transit Services: Assists with nutrition, care and transit needs. [place] Support Programs: @10RELATIVEDAYS @ > Cancer Support Group  2nd Tuesday of the month 1pm-2pm, Journey Room  > Creative Journey  3rd Tuesday of the month 1130am-1pm, Journey Room  > Look Good Feel Better  1st Wednesday of the month 10am-12 noon, Journey Room (Call [place] to register [phone])

## 2016-12-01 ENCOUNTER — Ambulatory Visit (HOSPITAL_COMMUNITY): Payer: Medicare Other

## 2016-12-07 ENCOUNTER — Encounter (HOSPITAL_BASED_OUTPATIENT_CLINIC_OR_DEPARTMENT_OTHER): Payer: Medicare Other

## 2016-12-07 VITALS — BP 111/59 | HR 70 | Temp 97.8°F | Resp 18 | Wt 155.2 lb

## 2016-12-07 DIAGNOSIS — Z452 Encounter for adjustment and management of vascular access device: Secondary | ICD-10-CM | POA: Diagnosis not present

## 2016-12-07 DIAGNOSIS — Z5112 Encounter for antineoplastic immunotherapy: Secondary | ICD-10-CM

## 2016-12-07 DIAGNOSIS — Z5111 Encounter for antineoplastic chemotherapy: Secondary | ICD-10-CM

## 2016-12-07 DIAGNOSIS — D61818 Other pancytopenia: Secondary | ICD-10-CM | POA: Diagnosis not present

## 2016-12-07 DIAGNOSIS — D472 Monoclonal gammopathy: Secondary | ICD-10-CM

## 2016-12-07 DIAGNOSIS — C8300 Small cell B-cell lymphoma, unspecified site: Secondary | ICD-10-CM

## 2016-12-07 DIAGNOSIS — D479 Neoplasm of uncertain behavior of lymphoid, hematopoietic and related tissue, unspecified: Secondary | ICD-10-CM

## 2016-12-07 DIAGNOSIS — C880 Waldenstrom macroglobulinemia: Secondary | ICD-10-CM

## 2016-12-07 LAB — CBC WITH DIFFERENTIAL/PLATELET
Basophils Absolute: 0 10*3/uL (ref 0.0–0.1)
Basophils Relative: 0 %
EOS ABS: 0.1 10*3/uL (ref 0.0–0.7)
EOS PCT: 3 %
HCT: 35.2 % — ABNORMAL LOW (ref 39.0–52.0)
Hemoglobin: 11.9 g/dL — ABNORMAL LOW (ref 13.0–17.0)
LYMPHS ABS: 0.5 10*3/uL — AB (ref 0.7–4.0)
LYMPHS PCT: 10 %
MCH: 30.7 pg (ref 26.0–34.0)
MCHC: 33.8 g/dL (ref 30.0–36.0)
MCV: 91 fL (ref 78.0–100.0)
MONO ABS: 0.5 10*3/uL (ref 0.1–1.0)
Monocytes Relative: 11 %
Neutro Abs: 3.4 10*3/uL (ref 1.7–7.7)
Neutrophils Relative %: 76 %
PLATELETS: 148 10*3/uL — AB (ref 150–400)
RBC: 3.87 MIL/uL — AB (ref 4.22–5.81)
RDW: 14.5 % (ref 11.5–15.5)
WBC: 4.6 10*3/uL (ref 4.0–10.5)

## 2016-12-07 LAB — COMPREHENSIVE METABOLIC PANEL
ALT: 15 U/L — ABNORMAL LOW (ref 17–63)
ANION GAP: 6 (ref 5–15)
AST: 19 U/L (ref 15–41)
Albumin: 3.9 g/dL (ref 3.5–5.0)
Alkaline Phosphatase: 58 U/L (ref 38–126)
BUN: 16 mg/dL (ref 6–20)
CHLORIDE: 101 mmol/L (ref 101–111)
CO2: 29 mmol/L (ref 22–32)
CREATININE: 1.28 mg/dL — AB (ref 0.61–1.24)
Calcium: 9.3 mg/dL (ref 8.9–10.3)
GFR, EST AFRICAN AMERICAN: 58 mL/min — AB (ref >60)
GFR, EST NON AFRICAN AMERICAN: 50 mL/min — AB (ref >60)
Glucose, Bld: 93 mg/dL (ref 65–99)
POTASSIUM: 4.4 mmol/L (ref 3.5–5.1)
SODIUM: 136 mmol/L (ref 135–145)
Total Bilirubin: 0.5 mg/dL (ref 0.3–1.2)
Total Protein: 6 g/dL — ABNORMAL LOW (ref 6.5–8.1)

## 2016-12-07 MED ORDER — SODIUM CHLORIDE 0.9 % IV SOLN
375.0000 mg/m2 | Freq: Once | INTRAVENOUS | Status: AC
  Administered 2016-12-07: 700 mg via INTRAVENOUS
  Filled 2016-12-07: qty 50

## 2016-12-07 MED ORDER — BENDAMUSTINE HCL CHEMO INJECTION 100 MG/4ML
90.0000 mg/m2 | Freq: Once | INTRAVENOUS | Status: AC
  Administered 2016-12-07: 175 mg via INTRAVENOUS
  Filled 2016-12-07: qty 7

## 2016-12-07 MED ORDER — ACETAMINOPHEN 325 MG PO TABS
650.0000 mg | ORAL_TABLET | Freq: Once | ORAL | Status: AC
  Administered 2016-12-07: 650 mg via ORAL
  Filled 2016-12-07: qty 2

## 2016-12-07 MED ORDER — SODIUM CHLORIDE 0.9 % IV SOLN
Freq: Once | INTRAVENOUS | Status: AC
  Administered 2016-12-07: 12:00:00 via INTRAVENOUS

## 2016-12-07 MED ORDER — HEPARIN SOD (PORK) LOCK FLUSH 100 UNIT/ML IV SOLN
500.0000 [IU] | Freq: Once | INTRAVENOUS | Status: AC | PRN
  Administered 2016-12-07: 500 [IU]
  Filled 2016-12-07 (×2): qty 5

## 2016-12-07 MED ORDER — STERILE WATER FOR INJECTION IJ SOLN
INTRAMUSCULAR | Status: AC
  Filled 2016-12-07: qty 10

## 2016-12-07 MED ORDER — DIPHENHYDRAMINE HCL 25 MG PO CAPS
50.0000 mg | ORAL_CAPSULE | Freq: Once | ORAL | Status: AC
  Administered 2016-12-07: 50 mg via ORAL
  Filled 2016-12-07: qty 2

## 2016-12-07 MED ORDER — DEXAMETHASONE SODIUM PHOSPHATE 10 MG/ML IJ SOLN
10.0000 mg | Freq: Once | INTRAMUSCULAR | Status: AC
  Administered 2016-12-07: 10 mg via INTRAVENOUS
  Filled 2016-12-07: qty 1

## 2016-12-07 MED ORDER — PALONOSETRON HCL INJECTION 0.25 MG/5ML
0.2500 mg | Freq: Once | INTRAVENOUS | Status: AC
Start: 2016-12-07 — End: 2016-12-07
  Administered 2016-12-07: 0.25 mg via INTRAVENOUS
  Filled 2016-12-07: qty 5

## 2016-12-07 MED ORDER — SODIUM CHLORIDE 0.9% FLUSH
10.0000 mL | INTRAVENOUS | Status: DC | PRN
  Administered 2016-12-07: 10 mL
  Filled 2016-12-07: qty 10

## 2016-12-07 MED ORDER — ALTEPLASE 2 MG IJ SOLR
2.0000 mg | Freq: Once | INTRAMUSCULAR | Status: AC | PRN
  Administered 2016-12-07: 2 mg

## 2016-12-07 MED ORDER — ALTEPLASE 2 MG IJ SOLR
INTRAMUSCULAR | Status: AC
  Filled 2016-12-07: qty 2

## 2016-12-07 NOTE — Progress Notes (Signed)
Paul Keith tolerated chemo tx well without complaints. Port accessed with 20 gauge needle and flushed but no blood return noted. Alteplase used per protocol and left indwelling about 1 hour with successful results Labs reviewed prior to administering chemotherapy. Port left accessed and saline locked for use tomorrow. Port flushed with 10 ml NS and 5 ml Heparin easily per protocol.VSS upon discharge. Pt discharged self ambulatory in satisfactory condition accompanied by his wife

## 2016-12-07 NOTE — Patient Instructions (Signed)
[facility] Discharge Instructions for Patients Receiving Chemotherapy   Beginning January 23rd 2017 lab work for the [facility] will be done in the  Main lab at [facility] on 1st floor. If you have a lab appointment with the [place] please come in thru the  Main Entrance and check in at the main information desk   Today you received the following chemotherapy agents Rituxan and Bendamustine. Follow-up as scheduled. Call clinic for any questions or concerns  To help prevent nausea and vomiting after your treatment, we encourage you to take your nausea medication    If you develop nausea and vomiting, or diarrhea that is not controlled by your medication, call the clinic.  The clinic phone number is [phone]. Office hours are Monday-Friday 8:30am-5:00pm.  BELOW ARE SYMPTOMS THAT SHOULD BE REPORTED IMMEDIATELY:  *FEVER GREATER THAN 101.0 F  *CHILLS WITH OR WITHOUT FEVER  NAUSEA AND VOMITING THAT IS NOT CONTROLLED WITH YOUR NAUSEA MEDICATION  *UNUSUAL SHORTNESS OF BREATH  *UNUSUAL BRUISING OR BLEEDING  TENDERNESS IN MOUTH AND THROAT WITH OR WITHOUT PRESENCE OF ULCERS  *URINARY PROBLEMS  *BOWEL PROBLEMS  UNUSUAL RASH Items with * indicate a potential emergency and should be followed up as soon as possible. If you have an emergency after office hours please contact your primary care physician or go to the nearest emergency department.  Please call the clinic during office hours if you have any questions or concerns.   You may also contact the Patient Navigator at [phone] should you have any questions or need assistance in obtaining follow up care.      Resources For Cancer Patients and their Caregivers ? American Cancer Society: Can assist with transportation, wigs, general needs, runs Look Good Feel Better.        (318)826-0002 ? Cancer Care: Provides financial assistance, online support groups, medication/co-pay assistance.   1-800-813-HOPE (437)786-9559) ? [place] Assists [place] cancer patients and their families through emotional , educational and financial support.  915-350-2468 ? [place] DSS Where to apply for food stamps, Medicaid and utility assistance. (720)312-1472 ? [employer]: Transportation to medical appointments. (330)343-1228 ? Social Security Administration: May apply for disability if have a Stage IV cancer. ([phone] ? [employer], Disability and Transit Services: Assists with nutrition, care and transit needs. 437-404-5350

## 2016-12-08 ENCOUNTER — Encounter (HOSPITAL_BASED_OUTPATIENT_CLINIC_OR_DEPARTMENT_OTHER): Payer: Medicare Other

## 2016-12-08 VITALS — BP 113/51 | HR 87 | Temp 98.6°F | Resp 16

## 2016-12-08 DIAGNOSIS — Z5111 Encounter for antineoplastic chemotherapy: Secondary | ICD-10-CM

## 2016-12-08 DIAGNOSIS — D472 Monoclonal gammopathy: Secondary | ICD-10-CM

## 2016-12-08 DIAGNOSIS — C8300 Small cell B-cell lymphoma, unspecified site: Secondary | ICD-10-CM | POA: Diagnosis not present

## 2016-12-08 DIAGNOSIS — C880 Waldenstrom macroglobulinemia: Secondary | ICD-10-CM

## 2016-12-08 MED ORDER — HEPARIN SOD (PORK) LOCK FLUSH 100 UNIT/ML IV SOLN
500.0000 [IU] | Freq: Once | INTRAVENOUS | Status: AC | PRN
  Administered 2016-12-08: 500 [IU]
  Filled 2016-12-08: qty 5

## 2016-12-08 MED ORDER — DEXAMETHASONE SODIUM PHOSPHATE 10 MG/ML IJ SOLN
10.0000 mg | Freq: Once | INTRAMUSCULAR | Status: AC
  Administered 2016-12-08: 10 mg via INTRAVENOUS
  Filled 2016-12-08: qty 1

## 2016-12-08 MED ORDER — SODIUM CHLORIDE 0.9 % IV SOLN
90.0000 mg/m2 | Freq: Once | INTRAVENOUS | Status: AC
  Administered 2016-12-08: 175 mg via INTRAVENOUS
  Filled 2016-12-08: qty 7

## 2016-12-08 MED ORDER — PEGFILGRASTIM 6 MG/0.6ML ~~LOC~~ PSKT
6.0000 mg | PREFILLED_SYRINGE | Freq: Once | SUBCUTANEOUS | Status: AC
  Administered 2016-12-08: 6 mg via SUBCUTANEOUS
  Filled 2016-12-08: qty 0.6

## 2016-12-08 MED ORDER — SODIUM CHLORIDE 0.9% FLUSH
10.0000 mL | INTRAVENOUS | Status: DC | PRN
  Administered 2016-12-08 (×2): 10 mL
  Filled 2016-12-08 (×2): qty 10

## 2016-12-08 MED ORDER — SODIUM CHLORIDE 0.9 % IV SOLN
Freq: Once | INTRAVENOUS | Status: AC
  Administered 2016-12-08: 13:00:00 via INTRAVENOUS

## 2016-12-08 NOTE — Patient Instructions (Signed)
[facility] Discharge Instructions for Patients Receiving Chemotherapy   Beginning January 23rd 2017 lab work for the Cancer Center will be done in the  Main lab at [facility] on 1st floor. If you have a lab appointment with the Cancer Center please come in thru the  Main Entrance and check in at the main information desk   Today you received the following chemotherapy agents   To help prevent nausea and vomiting after your treatment, we encourage you to take your nausea medication     If you develop nausea and vomiting, or diarrhea that is not controlled by your medication, call the clinic.  The clinic phone number is [phone]. Office hours are Monday-Friday 8:30am-5:00pm.  BELOW ARE SYMPTOMS THAT SHOULD BE REPORTED IMMEDIATELY:  *FEVER GREATER THAN 101.0 F  *CHILLS WITH OR WITHOUT FEVER  NAUSEA AND VOMITING THAT IS NOT CONTROLLED WITH YOUR NAUSEA MEDICATION  *UNUSUAL SHORTNESS OF BREATH  *UNUSUAL BRUISING OR BLEEDING  TENDERNESS IN MOUTH AND THROAT WITH OR WITHOUT PRESENCE OF ULCERS  *URINARY PROBLEMS  *BOWEL PROBLEMS  UNUSUAL RASH Items with * indicate a potential emergency and should be followed up as soon as possible. If you have an emergency after office hours please contact your primary care physician or go to the nearest emergency department.  Please call the clinic during office hours if you have any questions or concerns.   You may also contact the Patient Navigator at [phone] should you have any questions or need assistance in obtaining follow up care.      Resources For Cancer Patients and their Caregivers ? American Cancer Society: Can assist with transportation, wigs, general needs, runs Look Good Feel Better.        1-888-227-6333 ? Cancer Care: Provides financial assistance, online support groups, medication/co-pay assistance.  1-800-813-HOPE (4673) ? [facility] Assists [place] cancer  patients and their families through emotional , educational and financial support.  336-427-4357 ? [place] DSS Where to apply for food stamps, Medicaid and utility assistance. 336-342-1394 ? [employer]: Transportation to medical appointments. 336-347-2287 ? Social Security Administration: May apply for disability if have a Stage IV cancer. 336-342-7796 1-800-772-1213 ? [place] A[employer], Disability and Transit Services: Assists with nutrition, care and transit needs. [phone]         

## 2016-12-08 NOTE — Progress Notes (Signed)
Chemotherapy given today. Patient tolerated it well.  Paul Keith arrived today for [facility] neulasta on body injector. See MAR for administration details. Injector in place and engaged with green light indicator on flashing. Tolerated application with out problems. Vitals stable and discharged ambulatory from clinic. Follow up as scheduled.

## 2016-12-14 ENCOUNTER — Telehealth (HOSPITAL_COMMUNITY): Payer: Self-pay | Admitting: Emergency Medicine

## 2016-12-14 ENCOUNTER — Other Ambulatory Visit (HOSPITAL_COMMUNITY): Payer: Self-pay | Admitting: Oncology

## 2016-12-14 NOTE — Telephone Encounter (Signed)
Pt called and wanted to know if he went on a river cruise May 19 would he been completed with chemo.  Kirby Crigler PA said it looked like he would be but we cant always tell what will happen or if he would be on maintenance Rituxan, but if he would be it would be every 2 months and we could work around his cruise.  He verbalized understanding.

## 2016-12-21 ENCOUNTER — Ambulatory Visit (HOSPITAL_COMMUNITY): Payer: Medicare Other | Admitting: Hematology & Oncology

## 2016-12-21 ENCOUNTER — Ambulatory Visit (HOSPITAL_COMMUNITY): Payer: Medicare Other

## 2016-12-22 ENCOUNTER — Ambulatory Visit (HOSPITAL_COMMUNITY): Payer: Medicare Other

## 2016-12-24 ENCOUNTER — Telehealth (HOSPITAL_COMMUNITY): Payer: Self-pay | Admitting: Emergency Medicine

## 2016-12-24 ENCOUNTER — Other Ambulatory Visit (HOSPITAL_COMMUNITY): Payer: Self-pay | Admitting: Pharmacist

## 2016-12-24 ENCOUNTER — Inpatient Hospital Stay (HOSPITAL_COMMUNITY)
Admission: EM | Admit: 2016-12-24 | Discharge: 2016-12-27 | DRG: 809 | Disposition: A | Discharge status: Home / Self Care | Payer: Medicare Other | Attending: Family Medicine | Admitting: Family Medicine

## 2016-12-24 ENCOUNTER — Encounter (HOSPITAL_COMMUNITY): Payer: Self-pay | Admitting: Emergency Medicine

## 2016-12-24 ENCOUNTER — Emergency Department (HOSPITAL_COMMUNITY): Payer: Medicare Other

## 2016-12-24 ENCOUNTER — Other Ambulatory Visit: Payer: Self-pay

## 2016-12-24 DIAGNOSIS — Z9841 Cataract extraction status, right eye: Secondary | ICD-10-CM

## 2016-12-24 DIAGNOSIS — D500 Iron deficiency anemia secondary to blood loss (chronic): Secondary | ICD-10-CM | POA: Diagnosis not present

## 2016-12-24 DIAGNOSIS — D709 Neutropenia, unspecified: Secondary | ICD-10-CM | POA: Diagnosis present

## 2016-12-24 DIAGNOSIS — Z9842 Cataract extraction status, left eye: Secondary | ICD-10-CM | POA: Diagnosis not present

## 2016-12-24 DIAGNOSIS — C880 Waldenstrom macroglobulinemia: Secondary | ICD-10-CM | POA: Diagnosis present

## 2016-12-24 DIAGNOSIS — Z87891 Personal history of nicotine dependence: Secondary | ICD-10-CM

## 2016-12-24 DIAGNOSIS — R5081 Fever presenting with conditions classified elsewhere: Secondary | ICD-10-CM | POA: Diagnosis present

## 2016-12-24 DIAGNOSIS — Z9889 Other specified postprocedural states: Secondary | ICD-10-CM | POA: Diagnosis not present

## 2016-12-24 DIAGNOSIS — R197 Diarrhea, unspecified: Secondary | ICD-10-CM | POA: Diagnosis present

## 2016-12-24 DIAGNOSIS — D649 Anemia, unspecified: Secondary | ICD-10-CM | POA: Diagnosis present

## 2016-12-24 DIAGNOSIS — E876 Hypokalemia: Secondary | ICD-10-CM | POA: Diagnosis present

## 2016-12-24 DIAGNOSIS — D472 Monoclonal gammopathy: Secondary | ICD-10-CM | POA: Diagnosis present

## 2016-12-24 DIAGNOSIS — Z79899 Other long term (current) drug therapy: Secondary | ICD-10-CM | POA: Diagnosis not present

## 2016-12-24 DIAGNOSIS — N179 Acute kidney failure, unspecified: Secondary | ICD-10-CM | POA: Diagnosis present

## 2016-12-24 DIAGNOSIS — N189 Chronic kidney disease, unspecified: Secondary | ICD-10-CM | POA: Diagnosis present

## 2016-12-24 DIAGNOSIS — C8300 Small cell B-cell lymphoma, unspecified site: Secondary | ICD-10-CM | POA: Diagnosis present

## 2016-12-24 DIAGNOSIS — E871 Hypo-osmolality and hyponatremia: Secondary | ICD-10-CM | POA: Diagnosis present

## 2016-12-24 LAB — CBC WITH DIFFERENTIAL/PLATELET
HEMATOCRIT: 29.7 % — AB (ref 39.0–52.0)
HEMOGLOBIN: 10.2 g/dL — AB (ref 13.0–17.0)
MCH: 30.8 pg (ref 26.0–34.0)
MCHC: 34.3 g/dL (ref 30.0–36.0)
MCV: 89.7 fL (ref 78.0–100.0)
Platelets: 81 10*3/uL — ABNORMAL LOW (ref 150–400)
RBC: 3.31 MIL/uL — ABNORMAL LOW (ref 4.22–5.81)
RDW: 15.3 % (ref 11.5–15.5)
WBC: 1 10*3/uL — AB (ref 4.0–10.5)

## 2016-12-24 LAB — COMPREHENSIVE METABOLIC PANEL
ALT: 12 U/L — AB (ref 17–63)
AST: 16 U/L (ref 15–41)
Albumin: 3.4 g/dL — ABNORMAL LOW (ref 3.5–5.0)
Alkaline Phosphatase: 50 U/L (ref 38–126)
Anion gap: 7 (ref 5–15)
BUN: 26 mg/dL — AB (ref 6–20)
CHLORIDE: 96 mmol/L — AB (ref 101–111)
CO2: 23 mmol/L (ref 22–32)
CREATININE: 1.71 mg/dL — AB (ref 0.61–1.24)
Calcium: 8.3 mg/dL — ABNORMAL LOW (ref 8.9–10.3)
GFR calc Af Amer: 41 mL/min — ABNORMAL LOW (ref >60)
GFR calc non Af Amer: 35 mL/min — ABNORMAL LOW (ref >60)
GLUCOSE: 138 mg/dL — AB (ref 65–99)
POTASSIUM: 4.3 mmol/L (ref 3.5–5.1)
SODIUM: 126 mmol/L — AB (ref 135–145)
Total Bilirubin: 0.6 mg/dL (ref 0.3–1.2)
Total Protein: 5.6 g/dL — ABNORMAL LOW (ref 6.5–8.1)

## 2016-12-24 LAB — URINALYSIS, ROUTINE W REFLEX MICROSCOPIC
Bilirubin Urine: NEGATIVE
Glucose, UA: NEGATIVE mg/dL
HGB URINE DIPSTICK: NEGATIVE
KETONES UR: NEGATIVE mg/dL
Leukocytes, UA: NEGATIVE
Nitrite: NEGATIVE
Protein, ur: NEGATIVE mg/dL
SPECIFIC GRAVITY, URINE: 1.017 (ref 1.005–1.030)
pH: 5 (ref 5.0–8.0)

## 2016-12-24 LAB — I-STAT CG4 LACTIC ACID, ED
LACTIC ACID, VENOUS: 2.64 mmol/L — AB (ref 0.5–1.9)
Lactic Acid, Venous: 1.22 mmol/L (ref 0.5–1.9)

## 2016-12-24 LAB — INFLUENZA PANEL BY PCR (TYPE A & B)
INFLAPCR: NEGATIVE
Influenza B By PCR: NEGATIVE

## 2016-12-24 LAB — LIPASE, BLOOD: LIPASE: 16 U/L (ref 11–51)

## 2016-12-24 LAB — TROPONIN I: Troponin I: <0.03 ng/mL (ref <0.03)

## 2016-12-24 MED ORDER — SODIUM CHLORIDE 0.9 % IV BOLUS (SEPSIS)
250.0000 mL | Freq: Once | INTRAVENOUS | Status: AC
  Administered 2016-12-24: 250 mL via INTRAVENOUS

## 2016-12-24 MED ORDER — SODIUM CHLORIDE 0.9 % IV SOLN
1500.0000 mg | Freq: Once | INTRAVENOUS | Status: AC
  Administered 2016-12-24: 1500 mg via INTRAVENOUS
  Filled 2016-12-24: qty 1500

## 2016-12-24 MED ORDER — ACETAMINOPHEN 500 MG PO TABS
1000.0000 mg | ORAL_TABLET | Freq: Once | ORAL | Status: AC
  Administered 2016-12-24: 1000 mg via ORAL
  Filled 2016-12-24: qty 2

## 2016-12-24 MED ORDER — ACETAMINOPHEN 650 MG RE SUPP: 650.0000 mg | Freq: Four times a day (QID) | RECTAL | Status: DC | PRN

## 2016-12-24 MED ORDER — TAMSULOSIN HCL 0.4 MG PO CAPS
0.8000 mg | ORAL_CAPSULE | Freq: Every day | ORAL | Status: DC
  Administered 2016-12-25 – 2016-12-26 (×2): 0.8 mg via ORAL
  Filled 2016-12-24 (×2): qty 2

## 2016-12-24 MED ORDER — SODIUM CHLORIDE 0.9 % IV BOLUS (SEPSIS)
1000.0000 mL | Freq: Once | INTRAVENOUS | Status: AC
  Administered 2016-12-24: 1000 mL via INTRAVENOUS

## 2016-12-24 MED ORDER — ACETAMINOPHEN 500 MG PO TABS: 500.0000 mg | ORAL_TABLET | Freq: Four times a day (QID) | ORAL | Status: DC | PRN

## 2016-12-24 MED ORDER — SODIUM CHLORIDE 0.9 % IV SOLN
1000.0000 mL | INTRAVENOUS | Status: DC
  Administered 2016-12-24 – 2016-12-25 (×3): 1000 mL via INTRAVENOUS

## 2016-12-24 MED ORDER — PIPERACILLIN-TAZOBACTAM 3.375 G IVPB
3.3750 g | Freq: Three times a day (TID) | INTRAVENOUS | Status: DC
  Administered 2016-12-24 – 2016-12-26 (×7): 3.375 g via INTRAVENOUS
  Filled 2016-12-24 (×7): qty 50

## 2016-12-24 MED ORDER — SODIUM CHLORIDE 0.9 % IV BOLUS (SEPSIS)
1000.0000 mL | Freq: Once | INTRAVENOUS | Status: AC
Start: 2016-12-24 — End: 2016-12-24
  Administered 2016-12-24: 1000 mL via INTRAVENOUS

## 2016-12-24 MED ORDER — VANCOMYCIN HCL IN DEXTROSE 1-5 GM/200ML-% IV SOLN: 1000.0000 mg | Freq: Once | INTRAVENOUS | Status: DC

## 2016-12-24 MED ORDER — VANCOMYCIN HCL IN DEXTROSE 1-5 GM/200ML-% IV SOLN
1000.0000 mg | INTRAVENOUS | Status: DC
  Administered 2016-12-25 – 2016-12-26 (×2): 1000 mg via INTRAVENOUS
  Filled 2016-12-24: qty 200

## 2016-12-24 MED ORDER — ACETAMINOPHEN 325 MG PO TABS
650.0000 mg | ORAL_TABLET | Freq: Four times a day (QID) | ORAL | Status: DC | PRN
  Administered 2016-12-25 – 2016-12-26 (×4): 650 mg via ORAL
  Filled 2016-12-24 (×4): qty 2

## 2016-12-24 MED ORDER — SODIUM CHLORIDE 0.9% FLUSH
3.0000 mL | Freq: Two times a day (BID) | INTRAVENOUS | Status: DC
  Administered 2016-12-24 – 2016-12-26 (×5): 3 mL via INTRAVENOUS

## 2016-12-24 MED ORDER — PIPERACILLIN-TAZOBACTAM 3.375 G IVPB 30 MIN
3.3750 g | Freq: Once | INTRAVENOUS | Status: AC
  Administered 2016-12-24: 3.375 g via INTRAVENOUS
  Filled 2016-12-24: qty 50

## 2016-12-24 MED ORDER — DEXTROSE-NACL 5-0.9 % IV SOLN
INTRAVENOUS | Status: DC
  Administered 2016-12-24 – 2016-12-25 (×3): via INTRAVENOUS

## 2016-12-24 NOTE — ED Notes (Signed)
Pt was given tylenol by wife at home at 15.

## 2016-12-24 NOTE — ED Provider Notes (Signed)
[facility] DEPT Provider Note   CSN: [id number] Arrival date & time: 12/24/16  1314     History   Chief Complaint Chief Complaint  Patient presents with  . Weakness    chemo pt who is weak  . Fever    HPI Paul Keith is a 83 y.o. male.  HPI  Pt was seen at 1330. Per pt and his wife, c/o gradual onset and persistence of constant home "fever" since this morning. Pt's wife states pt's temp was "103.6" this morning, and she gave him tylenol at 11am PTA. Pt states LD chemo was 2 weeks ago and he has "had diarrhea ever since."  Denies CP/palpitations, no cough/SOB, no abd pain, no N/V, no black or blood in stools, no back pain.    Past Medical History:  Diagnosis Date  . Anemia 04/22/2016  . Diverticulosis    pt reports recent admission for diverticulosis perforation this past week  . 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 Pancytopenia ([place]) 04/22/2016    Patient Active Problem List   Diagnosis Date Noted  . Neutropenic fever ([place]) 11/25/2016  . Urinary frequency 09/14/2016  . At high risk for infection due to neutropenia ([place]) 07/23/2016  . Waldenstrom macroglobulinemia ([place]) 05/21/2016  . 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 IgM monoclonal gammopathy of uncertain significance 05/07/2016  . Lymphoproliferative disease ([place]) 05/07/2016  . Anemia 04/22/2016  . Pancytopenia ([place]) 04/22/2016    Past Surgical History:  Procedure Laterality Date  . CATARACT EXTRACTION Bilateral   . TONSILLECTOMY         Home Medications    Prior to Admission medications   Medication Sig Start Date End Date Taking? Authorizing Provider  acetaminophen (TYLENOL) 500 MG tablet Take 500 mg by mouth every 6 (six) hours as needed for mild pain.   Yes Historical Provider, MD  B Complex-C (B-COMPLEX WITH VITAMIN C) tablet Take 1 tablet by mouth daily.   Yes Historical Provider, MD  bendamustine in  sodium chloride 0.9 % 50 mL Inject into the vein once. Every 28 days   Yes Historical Provider, MD  cholecalciferol (VITAMIN D) 1000 units tablet Take 1,000 Units by mouth daily.   Yes Historical Provider, MD  Cyanocobalamin (B-12 PO) Take 1 tablet by mouth daily.   Yes Historical Provider, MD  cyanocobalamin 100 MCG tablet Take by mouth daily. Pt reports unsure of dosage, takes once daily   Yes Historical Provider, MD  dexamethasone (DECADRON) 4 MG tablet Take 2 tablets (8 mg total) by mouth daily. Start the day after bendamustine chemotherapy for 2 days. Take with food. 08/10/16  Yes Patrici Ranks, MD  loperamide (IMODIUM) 2 MG capsule Take 1 capsule (2 mg total) by mouth every 4 (four) hours as needed for diarrhea or loose stools. 11/28/16  Yes Thurnell Lose, MD  Multiple Vitamin (MULTIVITAMIN WITH MINERALS) TABS tablet Take 1 tablet by mouth daily.   Yes Historical Provider, MD  tamsulosin (FLOMAX) 0.4 MG CAPS capsule Take 0.8 mg by mouth daily after supper.    Yes Historical Provider, MD  cefdinir (OMNICEF) 300 MG capsule Take 1 capsule (300 mg total) by mouth 2 (two) times daily. Patient not taking: Reported on 12/24/2016 11/28/16   Thurnell Lose, MD  metroNIDAZOLE (FLAGYL) 500 MG tablet Take 1 tablet (500 mg total) by mouth 3 (three) times daily. Patient not taking: Reported on 12/24/2016 11/28/16   Thurnell Lose, MD  RiTUXimab (RITUXAN IV)  Inject into the vein as directed.     Historical Provider, MD    Family History No family history on file.  Social History Social History  Substance Use Topics  . Smoking status: Former Smoker    Quit date: 09/15/1962  . Smokeless tobacco: Never Used  . Alcohol use Yes     Comment: OCCASSIONAL     Allergies   Patient has no allergy information on record.   Review of Systems Review of Systems ROS: Statement: All systems negative except as marked or noted in the HPI; Constitutional: +fever and chills. ; ; Eyes: Negative for eye pain,  redness and discharge. ; ; ENMT: Negative for ear pain, hoarseness, nasal congestion, sinus pressure and sore throat. ; ; Cardiovascular: Negative for chest pain, palpitations, diaphoresis, dyspnea and peripheral edema. ; ; Respiratory: Negative for cough, wheezing and stridor. ; ; Gastrointestinal: +diarrhea. Negative for nausea, vomiting, abdominal pain, blood in stool, hematemesis, jaundice and rectal bleeding. . ; ; Genitourinary: Negative for dysuria, flank pain and hematuria. ; ; Musculoskeletal: Negative for back pain and neck pain. Negative for swelling and trauma.; ; Skin: Negative for pruritus, rash, abrasions, blisters, bruising and skin lesion.; ; Neuro: Negative for headache, lightheadedness and neck stiffness. Negative for weakness, altered level of consciousness, altered mental status, extremity weakness, paresthesias, involuntary movement, seizure and syncope.       Physical Exam Updated Vital Signs BP 127/56   Pulse 108   Temp 102.8 F (39.3 C) (Oral)   Resp 19   Ht 6' (1.829 m)   Wt 152 lb (68.9 kg)   SpO2 100%   BMI 20.61 kg/m   Patient Vitals for the past 24 hrs:  BP Temp Temp src Pulse Resp SpO2 Height Weight  12/24/16 1545 - - - 113 17 99 % - -  12/24/16 1530 101/60 - - - - 99 % - -  12/24/16 1506 - 102.8 F (39.3 C) Oral - - - - -  12/24/16 1500 127/56 - - 108 19 100 % - -  12/24/16 1430 118/61 - - 104 18 99 % - -  12/24/16 1400 102/55 - - 98 19 98 % - -  12/24/16 1345 - 103 F (39.4 C) Rectal - - - - -  12/24/16 1322 - - - - - - 6' (1.829 m) 152 lb (68.9 kg)  12/24/16 1321 (!) 89/45 98.1 F (36.7 C) Oral 111 18 99 % - -     Physical Exam 1335: Physical examination:  Nursing notes reviewed; Vital signs and O2 SAT reviewed;  Constitutional: Well developed, Well nourished, Well hydrated, In no acute distress; Head:  Normocephalic, atraumatic; Eyes: EOMI, PERRL, No scleral icterus; ENMT: Mouth and pharynx normal, Mucous membranes moist; Neck: Supple, Full  range of motion, No lymphadenopathy; Cardiovascular: Tachycardic rate and rhythm, No gallop; Respiratory: Breath sounds clear & equal bilaterally, No wheezes.  Speaking full sentences with ease, Normal respiratory effort/excursion; Chest: Nontender, Movement normal; Abdomen: Soft, Nontender, Nondistended, Normal bowel sounds; Genitourinary: No CVA tenderness; Extremities: Pulses normal, No tenderness, No edema, No calf edema or asymmetry.; Neuro: AA&Ox3, Major CN grossly intact.  Speech clear. No gross focal motor or sensory deficits in extremities.; Skin: Color normal, Warm, Dry.   ED Treatments / Results  Labs (all labs ordered are listed, but only abnormal results are displayed)   EKG  EKG Interpretation  Date/Time:  Friday December 24 2016 13:40:27 EST Ventricular Rate:  102 PR Interval:    QRS Duration: 99  QT Interval:  330 QTC Calculation: 430 R Axis:   -2 Text Interpretation:  Sinus tachycardia Anteroseptal infarct, old When compared with ECG of 11/25/2016 Rate faster Confirmed by [facility]  MD, Nunzio Cory [phone]) on 12/24/2016 1:56:09 PM       Radiology   Procedures Procedures (including critical care time)  Medications Ordered in ED Medications  sodium chloride 0.9 % bolus 1,000 mL (0 mLs Intravenous Stopped 12/24/16 1505)    And  sodium chloride 0.9 % bolus 1,000 mL (1,000 mLs Intravenous New Bag/Given 12/24/16 1502)    And  sodium chloride 0.9 % bolus 250 mL (not administered)  0.9 %  sodium chloride infusion (1,000 mLs Intravenous New Bag/Given 12/24/16 1357)  vancomycin (VANCOCIN) 1,500 mg in sodium chloride 0.9 % 500 mL IVPB (1,500 mg Intravenous New Bag/Given 12/24/16 1408)  acetaminophen (TYLENOL) tablet 1,000 mg (not administered)  piperacillin-tazobactam (ZOSYN) IVPB 3.375 g (0 g Intravenous Stopped 12/24/16 1437)     Initial Impression / Assessment and Plan / ED Course  I have reviewed the triage vital signs and the nursing notes.  Pertinent labs & imaging results that  were available during my care of the patient were reviewed by me and considered in my medical decision making (see chart for details).  MDM Reviewed: previous chart, nursing note and vitals Reviewed previous: labs and ECG Interpretation: labs, ECG and x-ray   Results for orders placed or performed during the hospital encounter of 12/24/16  Blood Culture (routine x 2)  Result Value Ref Range   Specimen Description BLOOD LEFT ARM DRAWN BY IV THERAPY    Special Requests BOTTLES DRAWN AEROBIC AND ANAEROBIC 10CC    Culture PENDING    Report Status PENDING   Comprehensive metabolic panel  Result Value Ref Range   Sodium 126 (L) 135 - 145 mmol/L   Potassium 4.3 3.5 - 5.1 mmol/L   Chloride 96 (L) 101 - 111 mmol/L   CO2 23 22 - 32 mmol/L   Glucose, Bld 138 (H) 65 - 99 mg/dL   BUN 26 (H) 6 - 20 mg/dL   Creatinine, Ser 1.71 (H) 0.61 - 1.24 mg/dL   Calcium 8.3 (L) 8.9 - 10.3 mg/dL   Total Protein 5.6 (L) 6.5 - 8.1 g/dL   Albumin 3.4 (L) 3.5 - 5.0 g/dL   AST 16 15 - 41 U/L   ALT 12 (L) 17 - 63 U/L   Alkaline Phosphatase 50 38 - 126 U/L   Total Bilirubin 0.6 0.3 - 1.2 mg/dL   GFR calc non Af Amer 35 (L) >60 mL/min   GFR calc Af Amer 41 (L) >60 mL/min   Anion gap 7 5 - 15  CBC WITH DIFFERENTIAL  Result Value Ref Range   WBC 1.0 (LL) 4.0 - 10.5 K/uL   RBC 3.31 (L) 4.22 - 5.81 MIL/uL   Hemoglobin 10.2 (L) 13.0 - 17.0 g/dL   HCT 29.7 (L) 39.0 - 52.0 %   MCV 89.7 78.0 - 100.0 fL   MCH 30.8 26.0 - 34.0 pg   MCHC 34.3 30.0 - 36.0 g/dL   RDW 15.3 11.5 - 15.5 %   Platelets 81 (L) 150 - 400 K/uL   WBC Morphology PREDOMINATELY LYMPHOCYTES   Lipase, blood  Result Value Ref Range   Lipase 16 11 - 51 U/L  Troponin I  Result Value Ref Range   Troponin I <0.03 <0.03 ng/mL  I-Stat CG4 Lactic Acid, ED  (not at  [facility])  Result Value Ref Range  Lactic Acid, Venous 2.64 (HH) 0.5 - 1.9 mmol/L   Comment NOTIFIED PHYSICIAN     Dg Chest Port 1 View Result Date: 12/24/2016 CLINICAL DATA:  WEAKNESS  AND FEVER SINCE LAST NIGHT, LYMPHOMA, LAST CHEMO WAS 2 WEEKS AGO, FORMER SMOKER SINCE 1963 EXAM: PORTABLE CHEST - 1 VIEW COMPARISON:  11/25/2016 FINDINGS: Lungs are clear. Left lateral costophrenic angle is partially excluded. Heart size and mediastinal contours are within normal limits. No effusion. Visualized bones unremarkable. Right IJ port catheter to the mid SVC. IMPRESSION: No acute cardiopulmonary disease. Electronically Signed   By: Lucrezia Europe M.D.   On: 12/24/2016 14:01    1530:  Code Sepsis called on pt's arrival to ED:  IVF, IV abx ordered. New hyponatremia and elevated BUN/Cr on labs today. BP improved after IVF. Pt given APAP for fever. Will obtain flu swab. T/C to [facility]. [place] Comment, case discussed, including:  HPI, pertinent PM/SHx, VS/PE, dx testing, ED course and treatment:  Agreeable to come to ED for evaluation for admission.    Final Clinical Impressions(s) / ED Diagnoses   Final diagnoses:  None    New Prescriptions New Prescriptions   No medications on file     Francine Graven, DO 12/28/16 7026

## 2016-12-24 NOTE — ED Notes (Signed)
CRITICAL VALUE ALERT  Critical value received:  Lactic 2.64, wbc 1 Date of notification:  12/24/16 Time of notification:  0919  Critical value read back:Yes.     Nurse who received alert:  Dellis Anes   MD notified (1st page):  Thurnell Garbe

## 2016-12-24 NOTE — H&P (Signed)
History and Physical    Paul Keith ZOX:[id number] DOB: Nov 08, 1930 DOA: 12/24/2016  PCP: Octavio Graves, DO  Patient coming from: Home.    Chief Complaint:  Fever under chemotherapy.   HPI:  Paul Keith is an 83 y.o. male with hx of NHL B cell, mixed plasma cell lymphoma,  undergoing chemotherapy, last admitted a month ago for neutropenic fever,  hx of anemia and diverticulosis, found to have temp of 102 last night and was told to come to the ER.  He has a port a cath, but denied pain, erythema, chills, coughs, or GU symptoms.  Work up in the ER showed WBC of 8.1X with lymphophillic predominant, clear CXR and negative UA.   His Cr was elevated at 1.7.   He has no rash.  He was given IV Zosyn and IV Vancomycin and hospitalist was asked to admit him for febrile neutropenia.  He has no hypotension, but his lactic acid was slightly elevated to 2.6.   He was pan cultured   ED Course:  See above.  Rewiew of Systems:  Constitutional: Negative for malaise, fever and chills. No significant weight loss or weight gain Eyes: Negative for eye pain, redness and discharge, diplopia, visual changes, or flashes of light. ENMT: Negative for ear pain, hoarseness, nasal congestion, sinus pressure and sore throat. No headaches; tinnitus, drooling, or problem swallowing. Cardiovascular: Negative for chest pain, palpitations, diaphoresis, dyspnea and peripheral edema. ; No orthopnea, PND Respiratory: Negative for cough, hemoptysis, wheezing and stridor. No pleuritic chestpain. Gastrointestinal: Negative for diarrhea, constipation,  melena, blood in stool, hematemesis, jaundice and rectal bleeding.    Genitourinary: Negative for frequency, dysuria, incontinence,flank pain and hematuria; Musculoskeletal: Negative for back pain and neck pain. Negative for swelling and trauma.;  Skin: . Negative for pruritus, rash, abrasions, bruising and skin lesion.; ulcerations Neuro: Negative for headache, lightheadedness and  neck stiffness. Negative for altered level of consciousness , altered mental status, extremity weakness, burning feet, involuntary movement, seizure and syncope.  Psych: negative for anxiety, depression, insomnia, tearfulness, panic attacks, hallucinations, paranoia, suicidal or homicidal ideation    Past Medical History:  Diagnosis Date  . Anemia 04/22/2016  . Diverticulosis    pt reports recent admission for diverticulosis perforation this past week  . 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 Pancytopenia ([place]) 04/22/2016    Past Surgical History:  Procedure Laterality Date  . CATARACT EXTRACTION Bilateral   . TONSILLECTOMY       reports that he quit smoking about 54 years ago. He has never used smokeless tobacco. He reports that he drinks alcohol. He reports that he does not use drugs.  Not on File  No family history on file.   Prior to Admission medications   Medication Sig Start Date End Date Taking? Authorizing Provider  acetaminophen (TYLENOL) 500 MG tablet Take 500 mg by mouth every 6 (six) hours as needed for mild pain.   Yes Historical Provider, MD  B Complex-C (B-COMPLEX WITH VITAMIN C) tablet Take 1 tablet by mouth daily.   Yes Historical Provider, MD  bendamustine in sodium chloride 0.9 % 50 mL Inject into the vein once. Every 28 days   Yes Historical Provider, MD  cholecalciferol (VITAMIN D) 1000 units tablet Take 1,000 Units by mouth daily.   Yes Historical Provider, MD  Cyanocobalamin (B-12 PO) Take 1 tablet by mouth daily.   Yes Historical Provider, MD  cyanocobalamin 100 MCG tablet Take by mouth daily. Pt  reports unsure of dosage, takes once daily   Yes Historical Provider, MD  dexamethasone (DECADRON) 4 MG tablet Take 2 tablets (8 mg total) by mouth daily. Start the day after bendamustine chemotherapy for 2 days. Take with food. 08/10/16  Yes Patrici Ranks, MD  loperamide (IMODIUM) 2 MG capsule  Take 1 capsule (2 mg total) by mouth every 4 (four) hours as needed for diarrhea or loose stools. 11/28/16  Yes Thurnell Lose, MD  Multiple Vitamin (MULTIVITAMIN WITH MINERALS) TABS tablet Take 1 tablet by mouth daily.   Yes Historical Provider, MD  tamsulosin (FLOMAX) 0.4 MG CAPS capsule Take 0.8 mg by mouth daily after supper.    Yes Historical Provider, MD  cefdinir (OMNICEF) 300 MG capsule Take 1 capsule (300 mg total) by mouth 2 (two) times daily. Patient not taking: Reported on 12/24/2016 11/28/16   Thurnell Lose, MD  metroNIDAZOLE (FLAGYL) 500 MG tablet Take 1 tablet (500 mg total) by mouth 3 (three) times daily. Patient not taking: Reported on 12/24/2016 11/28/16   Thurnell Lose, MD  RiTUXimab (RITUXAN IV) Inject into the vein as directed.     Historical Provider, MD    Physical Exam: Vitals:   12/24/16 1500 12/24/16 1506 12/24/16 1530 12/24/16 1545  BP: 127/56  101/60   Pulse: 108   113  Resp: 19   17  Temp:  102.8 F (39.3 C)    TempSrc:  Oral    SpO2: 100%  99% 99%  Weight:      Height:          Constitutional: NAD, calm, comfortable Vitals:   12/24/16 1500 12/24/16 1506 12/24/16 1530 12/24/16 1545  BP: 127/56  101/60   Pulse: 108   113  Resp: 19   17  Temp:  102.8 F (39.3 C)    TempSrc:  Oral    SpO2: 100%  99% 99%  Weight:      Height:       Eyes: PERRL, lids and conjunctivae normal ENMT: Mucous membranes are moist. Posterior pharynx clear of any exudate or lesions.Normal dentition.  Neck: normal, supple, no masses, no thyromegaly Respiratory: clear to auscultation bilaterally, no wheezing, no crackles. Normal respiratory effort. No accessory muscle use.  Cardiovascular: Regular rate and rhythm, no murmurs / rubs / gallops. No extremity edema. 2+ pedal pulses. No carotid bruits.  Abdomen: no tenderness, no masses palpated. No hepatosplenomegaly. Bowel sounds positive.  Musculoskeletal: no clubbing / cyanosis. No joint deformity upper and lower extremities.  Good ROM, no contractures. Normal muscle tone.  Skin: no rashes, lesions, ulcers. No induration Neurologic: CN 2-12 grossly intact. Sensation intact, DTR normal. Strength 5/5 in all 4.  Psychiatric: Normal judgment and insight. Alert and oriented x 3. Normal mood.     Labs on Admission: I have personally reviewed following labs and imaging studies  CBC:  Recent Labs Lab 12/24/16 1344  WBC 1.0*  HGB 10.2*  HCT 29.7*  MCV 89.7  PLT 81*   Basic Metabolic Panel:  Recent Labs Lab 12/24/16 1344  NA 126*  K 4.3  CL 96*  CO2 23  GLUCOSE 138*  BUN 26*  CREATININE 1.71*  CALCIUM 8.3*   GFR: Estimated Creatinine Clearance: 31.3 mL/min (by C-G formula based on SCr of 1.71 mg/dL (H)). Liver Function Tests:  Recent Labs Lab 12/24/16 1344  AST 16  ALT 12*  ALKPHOS 50  BILITOT 0.6  PROT 5.6*  ALBUMIN 3.4*    Recent Labs Lab 12/24/16  1344  LIPASE 16   Cardiac Enzymes:  Recent Labs Lab 12/24/16 1344  TROPONINI <0.03   Urine analysis:    Component Value Date/Time   COLORURINE AMBER (A) 11/25/2016 1526   APPEARANCEUR HAZY (A) 11/25/2016 1526   LABSPEC 1.017 11/25/2016 1526   PHURINE 5.0 11/25/2016 [place] 11/25/2016 1526   HGBUR NEGATIVE 11/25/2016 [place] 11/25/2016 [place] 11/25/2016 [place] 11/25/2016 1526   NITRITE NEGATIVE 11/25/2016 [place] 11/25/2016 1526    Recent Results (from the past 240 hour(s))  Blood Culture (routine x 2)     Status: None (Preliminary result)   Collection Time: 12/24/16  1:44 PM  Result Value Ref Range Status   Specimen Description BLOOD LEFT ARM DRAWN BY IV THERAPY  Final   Special Requests BOTTLES DRAWN AEROBIC AND ANAEROBIC 10CC  Final   Culture PENDING  Incomplete   Report Status PENDING  Incomplete     Radiological Exams on Admission: Dg Chest Port 1 View  Result Date: 12/24/2016 CLINICAL DATA:  WEAKNESS AND FEVER SINCE  LAST NIGHT, LYMPHOMA, LAST CHEMO WAS 2 WEEKS AGO, FORMER SMOKER SINCE 1963 EXAM: PORTABLE CHEST - 1 VIEW COMPARISON:  11/25/2016 FINDINGS: Lungs are clear. Left lateral costophrenic angle is partially excluded. Heart size and mediastinal contours are within normal limits. No effusion. Visualized bones unremarkable. Right IJ port catheter to the mid SVC. IMPRESSION: No acute cardiopulmonary disease. Electronically Signed   By: Lucrezia Europe M.D.   On: 12/24/2016 14:01    EKG: Independently reviewed.  Assessment/Plan Principal Problem:   Neutropenic fever (HCC) Active Problems:   Anemia   IgM monoclonal gammopathy of uncertain significance   Waldenstrom macroglobulinemia (HCC)   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HCC)    PLAN:   Neutropenic fever:  Will continue with IV Van/Zosyn.  Hold off on antifungal coverage.  Follow daily CBC.  He has received Neulasta about a week ago.  Consider repeating if no recovery.   Though he has a port, and it doesn't look infected, will continue with IV Lucianne Lei for now.   AKI on CKD:  Baseline Cr is normal at 1.0.  Suspect pre renal.  Avoid nephrotoxic drug and give IVF.  Follow cr carefully.    DVT prophylaxis: SCD.  Code Status: FULL CODE.  Family Communication: wife at bedside.  Disposition Plan: to home.  Consults called: None. Admission status: Inpatient.    Marolyn Urschel MD FACP. [facility]  If 7PM-7AM, please contact night-coverage [email or web address] Password [email or web address]  12/24/2016, 4:15 PM

## 2016-12-24 NOTE — Telephone Encounter (Signed)
[name] called and stated that Paul Keith was really weak and temp was 103.6, she had already given tylenol.  I told her to take him to the ER and let him them know that he had already had tylenol.  She verbalized understanding.

## 2016-12-24 NOTE — ED Notes (Signed)
Attempted report x1. 

## 2016-12-24 NOTE — Progress Notes (Signed)
Pharmacy Antibiotic Note  Paul Keith is a 83 y.o. male admitted on 12/24/2016 with sepsis.  Pharmacy has been consulted for Vancomycin and zosyn dosing.  Plan: Vancomycin 1500mg  loading dose, then 1000mg  IV every 24 hours.  Goal trough 15-20 mcg/mL. Zosyn 3.375g IV q8h (4 hour infusion).  F/U cultures and clinical progress Monitor V/S and labs, levels as indicated  Height: 6' (182.9 cm) Weight: 152 lb (68.9 kg) IBW/kg (Calculated) : 77.6  Temp (24hrs), Avg:101.3 F (38.5 C), Min:98.1 F (36.7 C), Max:103 F (39.4 C)   Recent Labs Lab 12/24/16 1344 12/24/16 1352  WBC 1.0*  --   CREATININE 1.71*  --   LATICACIDVEN  --  2.64*    Estimated Creatinine Clearance: 31.3 mL/min (by C-G formula based on SCr of 1.71 mg/dL (H)).    Antimicrobials this admission: Vancomycin 2/9 >>  Zosyn 2/9 >>   Dose adjustments this admission: N/A  Microbiology results: 2/9 BCx: pending 2/9 UCx: pending  Thank you for allowing pharmacy to be a part of this patient's care.  Isac Sarna, BS Pharm D, [place] Clinical Pharmacist Pager [phone] 12/24/2016 3:14 PM

## 2016-12-24 NOTE — ED Triage Notes (Signed)
Pt with lymphoma, last chemo 2 weeks ago- weakness and fever started last night- wife called cancer center who told pt to come to the ED for evaluation- wife started fever this morning of 102

## 2016-12-25 DIAGNOSIS — E871 Hypo-osmolality and hyponatremia: Secondary | ICD-10-CM

## 2016-12-25 DIAGNOSIS — R5081 Fever presenting with conditions classified elsewhere: Secondary | ICD-10-CM

## 2016-12-25 DIAGNOSIS — C880 Waldenstrom macroglobulinemia: Secondary | ICD-10-CM

## 2016-12-25 DIAGNOSIS — D500 Iron deficiency anemia secondary to blood loss (chronic): Secondary | ICD-10-CM

## 2016-12-25 DIAGNOSIS — C8300 Small cell B-cell lymphoma, unspecified site: Secondary | ICD-10-CM

## 2016-12-25 DIAGNOSIS — D472 Monoclonal gammopathy: Secondary | ICD-10-CM

## 2016-12-25 DIAGNOSIS — D709 Neutropenia, unspecified: Principal | ICD-10-CM

## 2016-12-25 LAB — CBC WITH DIFFERENTIAL/PLATELET
BASOS PCT: 0 %
Basophils Absolute: 0 10*3/uL (ref 0.0–0.1)
EOS ABS: 0 10*3/uL (ref 0.0–0.7)
Eosinophils Relative: 0 %
HCT: 27 % — ABNORMAL LOW (ref 39.0–52.0)
HEMOGLOBIN: 9.3 g/dL — AB (ref 13.0–17.0)
Lymphocytes Relative: 39 %
Lymphs Abs: 0.3 10*3/uL — ABNORMAL LOW (ref 0.7–4.0)
MCH: 31 pg (ref 26.0–34.0)
MCHC: 34.4 g/dL (ref 30.0–36.0)
MCV: 90 fL (ref 78.0–100.0)
Monocytes Absolute: 0.4 10*3/uL (ref 0.1–1.0)
Monocytes Relative: 59 %
NEUTROS PCT: 2 %
Neutro Abs: 0 10*3/uL — ABNORMAL LOW (ref 1.7–7.7)
PLATELETS: 69 10*3/uL — AB (ref 150–400)
RBC: 3 MIL/uL — AB (ref 4.22–5.81)
RDW: 15.4 % (ref 11.5–15.5)
WBC: 0.6 10*3/uL — AB (ref 4.0–10.5)

## 2016-12-25 LAB — BASIC METABOLIC PANEL
Anion gap: 9 (ref 5–15)
BUN: 17 mg/dL (ref 6–20)
CHLORIDE: 108 mmol/L (ref 101–111)
CO2: 16 mmol/L — ABNORMAL LOW (ref 22–32)
CREATININE: 1.35 mg/dL — AB (ref 0.61–1.24)
Calcium: 8 mg/dL — ABNORMAL LOW (ref 8.9–10.3)
GFR calc non Af Amer: 47 mL/min — ABNORMAL LOW (ref >60)
GFR, EST AFRICAN AMERICAN: 54 mL/min — AB (ref >60)
Glucose, Bld: 132 mg/dL — ABNORMAL HIGH (ref 65–99)
Potassium: 3.1 mmol/L — ABNORMAL LOW (ref 3.5–5.1)
SODIUM: 133 mmol/L — AB (ref 135–145)

## 2016-12-25 MED ORDER — LOPERAMIDE HCL 2 MG PO CAPS
4.0000 mg | ORAL_CAPSULE | Freq: Once | ORAL | Status: AC
  Administered 2016-12-25: 4 mg via ORAL
  Filled 2016-12-25: qty 2

## 2016-12-25 NOTE — Progress Notes (Signed)
Patient admitted to med-surg, cardiac tele ordered per central tele notification. Tele order placed per ED physician. No need to tele per Dr. Adair Patter. Notified Central tele. Donavan Foil, RN

## 2016-12-25 NOTE — Plan of Care (Signed)
Problem: Safety: Goal: Ability to remain free from injury will improve Outcome: Progressing Discussed fall prevention/safety plan with patient. Instructed to call for assistance when needing to get out of bed. Call light kept within reach. nonslip socks in place. Verbalized understanding of safety plan. Donavan Foil, RN

## 2016-12-25 NOTE — Progress Notes (Signed)
Patient reports frequent liquid stools during the night. Unable to collect stool sample for c-diff due to urine mixed with stool per night shift RN report. Pt had very small loose stool this am, urine mixed in. Unable to collect sample at that time. Enteric precautions remain in place. Donavan Foil, RN

## 2016-12-25 NOTE — Progress Notes (Signed)
PROGRESS NOTE    Paul Keith  RKY:[id number] DOB: [birth year] DOA: 12/24/2016 PCP: Octavio Graves, DO    Brief Narrative:   Paul Keith an 83 y.o.malewith hx of NHL B cell, mixed plasma cell lymphoma, undergoing chemotherapy, last admitted a month ago for neutropenic fever,  hx of anemia and diverticulosis, found to have temp of 102 last night and was told to come to the ER. He has a port a cath, but denied pain, erythema, chills, coughs, or GU symptoms. Work up in the ER showed WBC of 3.1D with lymphophillic predominant, clear CXR and negative UA.  His Cr was elevated at 1.7.  He has no rash. He was given IV Zosyn and IV Vancomycin and hospitalist was asked to admit him for febrile neutropenia. He has no hypotension, but his lactic acid was slightly elevated to 2.6.  He was pan cultured.   Assessment & Plan:   Principal Problem:   Neutropenic fever ([place]) Active Problems:   Anemia   IgM monoclonal gammopathy of uncertain significance   Waldenstrom macroglobulinemia (HCC)   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HCC)   Neutropenic fever - continue with IV Van/Zosyn - Hold off on antifungal coverage - Follow daily CBC - He has received Neulasta about a week ago (Consider repeating if no recovery) - patient having diarrhea but C. Diff negative - worsened WBC today - continue enteric precautions - follow cultures  - initially lactate elevated and repeat WNL - repeat CBCD in am  Hyponatremia - repeat BMP ordered - has received IVF all day - can d/c if improved  AKI on CKD -  Baseline Cr is normal at 1.0 (Suspect pre renal) - Avoid nephrotoxic drug and give IVF - repeat BMP in am and then if improved d/c IVF    DVT prophylaxis: SCDs Code Status: Full Code Family Communication: wife is bedside Disposition Plan: pending improvement of WBC and cessation of fevers   Consultants:   oncology  Procedures:   none  Antimicrobials:   Vancomycin  Zosyn      Subjective: Patient reports numerous episodes of diarrhea overnight.  Mentioned no blood in the diarrhea and no abdominal pain but is feeling very weak from getting up all night.  Asking for immodium which he states he occasionally takes at home.  Reports no feeling feverish and denies chills.  Ate breakfast relatively well this am.  Objective: Vitals:   12/24/16 1800 12/24/16 2218 12/25/16 0612 12/25/16 1514  BP: (!) 97/41 (!) 100/52 (!) 98/57 (!) 125/43  Pulse: 89 84 88 (!) 102  Resp:  18 20 18   Temp: 100.3 F (37.9 C) 99.8 F (37.7 C) 99.9 F (37.7 C) 98.8 F (37.1 C)  TempSrc: Oral Oral Oral Oral  SpO2: 98% 97% 98% 100%  Weight: 70.2 kg (154 lb 11.2 oz)     Height: 6' (1.829 m)       Intake/Output Summary (Last 24 hours) at 12/25/16 1728 Last data filed at 12/25/16 1704  Gross per 24 hour  Intake          2844.25 ml  Output              300 ml  Net          2544.25 ml   Filed Weights   12/24/16 1322 12/24/16 1800  Weight: 68.9 kg (152 lb) 70.2 kg (154 lb 11.2 oz)    Examination:  General exam: Appears calm and comfortable  Respiratory system: Clear to auscultation. Respiratory effort normal. Cardiovascular  system: S1 & S2 heard, RRR. No JVD, murmurs, rubs, gallops or clicks. No pedal edema. Gastrointestinal system: Abdomen is nondistended, soft and nontender. No organomegaly or masses felt. Normal bowel sounds heard. Central nervous system: Alert and oriented. No focal neurological deficits. Extremities: Symmetric 5 x 5 power. Skin: No rashes, lesions or ulcers Psychiatry: Judgement and insight appear normal. Mood & affect appropriate.     Data Reviewed: I have personally reviewed following labs and imaging studies  CBC:  Recent Labs Lab 12/24/16 1344 12/25/16 0653  WBC 1.0* 0.6*  NEUTROABS  --  0.0*  HGB 10.2* 9.3*  HCT 29.7* 27.0*  MCV 89.7 90.0  PLT 81* 69*   Basic Metabolic Panel:  Recent Labs Lab 12/24/16 1344  NA 126*  K 4.3  CL 96*   CO2 23  GLUCOSE 138*  BUN 26*  CREATININE 1.71*  CALCIUM 8.3*   GFR: Estimated Creatinine Clearance: 31.9 mL/min (by C-G formula based on SCr of 1.71 mg/dL (H)). Liver Function Tests:  Recent Labs Lab 12/24/16 1344  AST 16  ALT 12*  ALKPHOS 50  BILITOT 0.6  PROT 5.6*  ALBUMIN 3.4*    Recent Labs Lab 12/24/16 1344  LIPASE 16   No results for input(s): AMMONIA in the last 168 hours. Coagulation Profile: No results for input(s): INR, PROTIME in the last 168 hours. Cardiac Enzymes:  Recent Labs Lab 12/24/16 1344  TROPONINI <0.03   BNP (last 3 results) No results for input(s): PROBNP in the last 8760 hours. HbA1C: No results for input(s): HGBA1C in the last 72 hours. CBG: No results for input(s): GLUCAP in the last 168 hours. Lipid Profile: No results for input(s): CHOL, HDL, LDLCALC, TRIG, CHOLHDL, LDLDIRECT in the last 72 hours. Thyroid Function Tests: No results for input(s): TSH, T4TOTAL, FREET4, T3FREE, THYROIDAB in the last 72 hours. Anemia Panel: No results for input(s): VITAMINB12, FOLATE, FERRITIN, TIBC, IRON, RETICCTPCT in the last 72 hours. Sepsis Labs:  Recent Labs Lab 12/24/16 1352 12/24/16 1709  LATICACIDVEN 2.64* 1.22    Recent Results (from the past 240 hour(s))  Blood Culture (routine x 2)     Status: None (Preliminary result)   Collection Time: 12/24/16  1:44 PM  Result Value Ref Range Status   Specimen Description BLOOD LEFT ARM DRAWN BY IV THERAPY  Final   Special Requests BOTTLES DRAWN AEROBIC AND ANAEROBIC 10CC  Final   Culture PENDING  Incomplete   Report Status PENDING  Incomplete  Blood Culture (routine x 2)     Status: None (Preliminary result)   Collection Time: 12/24/16  1:56 PM  Result Value Ref Range Status   Specimen Description BLOOD LEFT WRIST  Final   Special Requests BOTTLES DRAWN AEROBIC AND ANAEROBIC [facility] EACH  Final   Culture PENDING  Incomplete   Report Status PENDING  Incomplete         Radiology  Studies: Dg Chest Port 1 View  Result Date: 12/24/2016 CLINICAL DATA:  WEAKNESS AND FEVER SINCE LAST NIGHT, LYMPHOMA, LAST CHEMO WAS 2 WEEKS AGO, FORMER SMOKER SINCE 1963 EXAM: PORTABLE CHEST - 1 VIEW COMPARISON:  11/25/2016 FINDINGS: Lungs are clear. Left lateral costophrenic angle is partially excluded. Heart size and mediastinal contours are within normal limits. No effusion. Visualized bones unremarkable. Right IJ port catheter to the mid SVC. IMPRESSION: No acute cardiopulmonary disease. Electronically Signed   By: Lucrezia Europe M.D.   On: 12/24/2016 14:01        Scheduled Meds: . piperacillin-tazobactam (ZOSYN)  IV  3.375 g Intravenous Q8H  . sodium chloride flush  3 mL Intravenous Q12H  . tamsulosin  0.8 mg Oral QPC supper  . vancomycin  1,000 mg Intravenous Q24H   Continuous Infusions: . sodium chloride 1,000 mL (12/25/16 0302)  . dextrose 5 % and 0.9% NaCl 100 mL/hr at 12/25/16 0816     LOS: 1 day    Time spent: 30 minutes    Loretha Stapler, MD [facility] Pager [phone]  If 7PM-7AM, please contact night-coverage [email or web address] Password [facility] 12/25/2016, 5:28 PM

## 2016-12-25 NOTE — Progress Notes (Signed)
CRITICAL VALUE ALERT  Critical value received:  WBC 0.6 (WBC 1.0 yesterday, neutropenic precautions in place)   Date of notification:  12/25/16  Time of notification:  3568  Critical value read back:Yes.    Nurse who received alert:  Donavan Foil, RN  MD notified (1st page):  Dr. Adair Patter text paged.   Time of first page:  0755  MD notified (2nd page):  Time of second page:  Responding MD:    Time MD responded:

## 2016-12-26 LAB — CBC WITH DIFFERENTIAL/PLATELET
BASOS PCT: 0 %
Basophils Absolute: 0 10*3/uL (ref 0.0–0.1)
EOS ABS: 0 10*3/uL (ref 0.0–0.7)
Eosinophils Relative: 4 %
HCT: 24.5 % — ABNORMAL LOW (ref 39.0–52.0)
Hemoglobin: 8.5 g/dL — ABNORMAL LOW (ref 13.0–17.0)
LYMPHS ABS: 0.1 10*3/uL — AB (ref 0.7–4.0)
Lymphocytes Relative: 15 %
MCH: 30.9 pg (ref 26.0–34.0)
MCHC: 34.7 g/dL (ref 30.0–36.0)
MCV: 89.1 fL (ref 78.0–100.0)
Monocytes Absolute: 0.4 10*3/uL (ref 0.1–1.0)
Monocytes Relative: 47 %
NEUTROS ABS: 0.3 10*3/uL — AB (ref 1.7–7.7)
NEUTROS PCT: 34 %
Platelets: 72 10*3/uL — ABNORMAL LOW (ref 150–400)
RBC: 2.75 MIL/uL — AB (ref 4.22–5.81)
RDW: 15.4 % (ref 11.5–15.5)
WBC: 0.9 10*3/uL — AB (ref 4.0–10.5)

## 2016-12-26 LAB — BASIC METABOLIC PANEL
Anion gap: 5 (ref 5–15)
BUN: 15 mg/dL (ref 6–20)
CALCIUM: 8.2 mg/dL — AB (ref 8.9–10.3)
CHLORIDE: 110 mmol/L (ref 101–111)
CO2: 20 mmol/L — AB (ref 22–32)
CREATININE: 1.32 mg/dL — AB (ref 0.61–1.24)
GFR calc Af Amer: 55 mL/min — ABNORMAL LOW (ref >60)
GFR calc non Af Amer: 48 mL/min — ABNORMAL LOW (ref >60)
GLUCOSE: 106 mg/dL — AB (ref 65–99)
Potassium: 2.8 mmol/L — ABNORMAL LOW (ref 3.5–5.1)
Sodium: 135 mmol/L (ref 135–145)

## 2016-12-26 LAB — URINE CULTURE: CULTURE: NO GROWTH

## 2016-12-26 LAB — C DIFFICILE QUICK SCREEN W PCR REFLEX
C DIFFICILE (CDIFF) INTERP: NOT DETECTED
C DIFFICILE (CDIFF) TOXIN: NEGATIVE
C DIFFICLE (CDIFF) ANTIGEN: NEGATIVE

## 2016-12-26 MED ORDER — POTASSIUM CHLORIDE 10 MEQ/100ML IV SOLN
10.0000 meq | INTRAVENOUS | Status: AC
  Administered 2016-12-26 (×3): 10 meq via INTRAVENOUS
  Filled 2016-12-26 (×3): qty 100

## 2016-12-26 MED ORDER — SODIUM CHLORIDE 0.9 % IV SOLN
1000.0000 mL | INTRAVENOUS | Status: DC
  Administered 2016-12-26: 1000 mL via INTRAVENOUS

## 2016-12-26 MED ORDER — LOPERAMIDE HCL 2 MG PO CAPS
4.0000 mg | ORAL_CAPSULE | Freq: Once | ORAL | Status: AC
  Administered 2016-12-26: 4 mg via ORAL
  Filled 2016-12-26: qty 2

## 2016-12-26 MED ORDER — SODIUM CHLORIDE 0.9 % IV SOLN: 1000.0000 mL | INTRAVENOUS | Status: DC

## 2016-12-26 MED ORDER — POTASSIUM CHLORIDE CRYS ER 20 MEQ PO TBCR
40.0000 meq | EXTENDED_RELEASE_TABLET | Freq: Two times a day (BID) | ORAL | Status: DC
  Administered 2016-12-26 – 2016-12-27 (×3): 40 meq via ORAL
  Filled 2016-12-26 (×3): qty 2

## 2016-12-26 MED ORDER — SODIUM CHLORIDE 0.9 % IV SOLN: 30.0000 meq | Freq: Once | INTRAVENOUS | Status: DC

## 2016-12-26 NOTE — Progress Notes (Signed)
Patient has order for D5NS and NS ordered.  Dr. Adair Patter notified via text page.  Dr. Adair Patter called and gave order to discontinue the D5NS.

## 2016-12-26 NOTE — Progress Notes (Signed)
Dr. Adair Patter gave order one time dose of Imodium and change NS kvo.

## 2016-12-26 NOTE — Progress Notes (Signed)
CRITICAL VALUE ALERT  Critical value received:  WBC 0.9  Date of notification:  12/26/2016  Time of notification:  0806  Critical value read back:Yes.    Nurse who received alert:  Gershon Cull, RN  MD notified (1st page):  Dr. Adair Patter  Time of first page:  0815  MD notified (2nd page):  Time of second page:  Responding MD:    Time MD responded:

## 2016-12-26 NOTE — Progress Notes (Signed)
PROGRESS NOTE    Paul Keith  NFA:[id number] DOB: [birth year] DOA: 12/24/2016 PCP: Paul Graves, DO    Brief Narrative:   Paul Keith an 83 y.o.malewith hx of NHL B cell, mixed plasma cell lymphoma, undergoing chemotherapy, last admitted a month ago for neutropenic fever,  hx of anemia and diverticulosis, found to have temp of 102 last night and was told to come to the ER. He has a port a cath, but denied pain, erythema, chills, coughs, or GU symptoms. Work up in the ER showed WBC of 4.6N with lymphophillic predominant, clear CXR and negative UA.  His Cr was elevated at 1.7.  He has no rash. He was given IV Zosyn and IV Vancomycin and hospitalist was asked to admit him for febrile neutropenia. He has no hypotension, but his lactic acid was slightly elevated to 2.6.  He was pan cultured.  He had persistent diarrhea and was given Immodium.   Assessment & Plan:   Principal Problem:   Neutropenic fever ([place]) Active Problems:   Anemia   IgM monoclonal gammopathy of uncertain significance   Waldenstrom macroglobulinemia (HCC)   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HCC)   Neutropenic fever - continue with IV Van/Zosyn - Hold off on antifungal coverage - Follow daily CBC - He has received Neulasta about a week ago (Consider repeating if no recovery) - patient having diarrhea but C. Diff negative - worsened WBC today - continue enteric precautions - follow cultures  - initially lactate elevated and repeat WNL - repeat CBCD in am  Hyponatremia - improved  Hypokalemia - giving IV and PO potassium replacement - repeat BMP in am  AKI on CKD -  Baseline Cr is normal at 1.0 (Suspect pre renal) - Cr this am at 1.32 - Avoid nephrotoxic drug and give IVF - saline lock IV   DVT prophylaxis: SCDs Code Status: Full Code Family Communication: no family bedside Disposition Plan: pending improvement of WBC and cessation of fevers   Consultants:    oncology  Procedures:   none  Antimicrobials:   Vancomycin  Zosyn    Subjective: Patient states he did sleep a little last night but reports that his diarrhea has slowly decreased.  Reports that he is uncomfortable from sitting on the bedside commode due to the bars.  Asking for another dose of immodium.  Barely ate breakfast because he states he just feels wiped.  Hoping to try to eat lunch.  Objective: Vitals:   12/25/16 0612 12/25/16 1514 12/25/16 2127 12/26/16 0434  BP: (!) 98/57 (!) 125/43 (!) 97/36 (!) 92/55  Pulse: 88 (!) 102 85 80  Resp: 20 18 18 18   Temp: 99.9 F (37.7 C) 98.8 F (37.1 C) 98.9 F (37.2 C) 98 F (36.7 C)  TempSrc: Oral Oral Oral Oral  SpO2: 98% 100% 99% 100%  Weight:      Height:        Intake/Output Summary (Last 24 hours) at 12/26/16 1120 Last data filed at 12/26/16 0743  Gross per 24 hour  Intake             1050 ml  Output              550 ml  Net              500 ml   Filed Weights   12/24/16 1322 12/24/16 1800  Weight: 68.9 kg (152 lb) 70.2 kg (154 lb 11.2 oz)    Examination:  General exam: Appears calm and comfortable  Respiratory system: Clear to auscultation. Respiratory effort normal. Cardiovascular system: S1 & S2 heard, RRR. No JVD, murmurs, rubs, gallops or clicks. No pedal edema. Gastrointestinal system: Abdomen is nondistended, soft and nontender. No organomegaly or masses felt. Normal bowel sounds heard. Central nervous system: Alert and oriented. No focal neurological deficits. Extremities: Symmetric 5 x 5 power. Skin: No rashes, lesions or ulcers Psychiatry: Judgement and insight appear normal. Mood & affect appropriate.     Data Reviewed: I have personally reviewed following labs and imaging studies  CBC:  Recent Labs Lab 12/24/16 1344 12/25/16 0653 12/26/16 0619  WBC 1.0* 0.6* 0.9*  NEUTROABS  --  0.0* 0.3*  HGB 10.2* 9.3* 8.5*  HCT 29.7* 27.0* 24.5*  MCV 89.7 90.0 89.1  PLT 81* 69* 72*   Basic  Metabolic Panel:  Recent Labs Lab 12/24/16 1344 12/25/16 1759 12/26/16 0619  NA 126* 133* 135  K 4.3 3.1* 2.8*  CL 96* 108 110  CO2 23 16* 20*  GLUCOSE 138* 132* 106*  BUN 26* 17 15  CREATININE 1.71* 1.35* 1.32*  CALCIUM 8.3* 8.0* 8.2*   GFR: Estimated Creatinine Clearance: 41.4 mL/min (by C-G formula based on SCr of 1.32 mg/dL (H)). Liver Function Tests:  Recent Labs Lab 12/24/16 1344  AST 16  ALT 12*  ALKPHOS 50  BILITOT 0.6  PROT 5.6*  ALBUMIN 3.4*    Recent Labs Lab 12/24/16 1344  LIPASE 16   No results for input(s): AMMONIA in the last 168 hours. Coagulation Profile: No results for input(s): INR, PROTIME in the last 168 hours. Cardiac Enzymes:  Recent Labs Lab 12/24/16 1344  TROPONINI <0.03   BNP (last 3 results) No results for input(s): PROBNP in the last 8760 hours. HbA1C: No results for input(s): HGBA1C in the last 72 hours. CBG: No results for input(s): GLUCAP in the last 168 hours. Lipid Profile: No results for input(s): CHOL, HDL, LDLCALC, TRIG, CHOLHDL, LDLDIRECT in the last 72 hours. Thyroid Function Tests: No results for input(s): TSH, T4TOTAL, FREET4, T3FREE, THYROIDAB in the last 72 hours. Anemia Panel: No results for input(s): VITAMINB12, FOLATE, FERRITIN, TIBC, IRON, RETICCTPCT in the last 72 hours. Sepsis Labs:  Recent Labs Lab 12/24/16 1352 12/24/16 1709  LATICACIDVEN 2.64* 1.22    Recent Results (from the past 240 hour(s))  Blood Culture (routine x 2)     Status: None (Preliminary result)   Collection Time: 12/24/16  1:44 PM  Result Value Ref Range Status   Specimen Description BLOOD LEFT ARM DRAWN BY IV THERAPY  Final   Special Requests BOTTLES DRAWN AEROBIC AND ANAEROBIC 10CC  Final   Culture PENDING  Incomplete   Report Status PENDING  Incomplete  Blood Culture (routine x 2)     Status: None (Preliminary result)   Collection Time: 12/24/16  1:56 PM  Result Value Ref Range Status   Specimen Description BLOOD LEFT  WRIST  Final   Special Requests BOTTLES DRAWN AEROBIC AND ANAEROBIC [facility] EACH  Final   Culture PENDING  Incomplete   Report Status PENDING  Incomplete  Urine culture     Status: None   Collection Time: 12/24/16  4:40 PM  Result Value Ref Range Status   Specimen Description URINE, CLEAN CATCH  Final   Special Requests NONE  Final   Culture   Final    NO GROWTH Performed at [facility], [place] [address], [place], [place] [address]    Report Status 12/26/2016 FINAL  Final  Radiology Studies: Dg Chest Port 1 View  Result Date: 12/24/2016 CLINICAL DATA:  WEAKNESS AND FEVER SINCE LAST NIGHT, LYMPHOMA, LAST CHEMO WAS 2 WEEKS AGO, FORMER SMOKER SINCE 1963 EXAM: PORTABLE CHEST - 1 VIEW COMPARISON:  11/25/2016 FINDINGS: Lungs are clear. Left lateral costophrenic angle is partially excluded. Heart size and mediastinal contours are within normal limits. No effusion. Visualized bones unremarkable. Right IJ port catheter to the mid SVC. IMPRESSION: No acute cardiopulmonary disease. Electronically Signed   By: Lucrezia Europe M.D.   On: 12/24/2016 14:01        Scheduled Meds: . piperacillin-tazobactam (ZOSYN)  IV  3.375 g Intravenous Q8H  . potassium chloride  10 mEq Intravenous Q1 Hr x 3  . potassium chloride  40 mEq Oral BID  . sodium chloride flush  3 mL Intravenous Q12H  . tamsulosin  0.8 mg Oral QPC supper  . vancomycin  1,000 mg Intravenous Q24H   Continuous Infusions: . sodium chloride 1,000 mL (12/26/16 0830)     LOS: 2 days    Time spent: 30 minutes    Loretha Stapler, MD [facility] Pager [phone]  If 7PM-7AM, please contact night-coverage [email or web address] Password [email or web address] 12/26/2016, 11:20 AM

## 2016-12-26 NOTE — Progress Notes (Signed)
Dr. Adair Patter notified via text page that patient's CDiff was negative.

## 2016-12-26 NOTE — Progress Notes (Signed)
Unable to obtain stool specimen.  Patient reports that diarrhea is "substantially better since this morning."  Dr. Adair Patter notified that unable to obtain specimen and will continue to monitor.

## 2016-12-27 LAB — CBC WITH DIFFERENTIAL/PLATELET
BASOS ABS: 0 10*3/uL (ref 0.0–0.1)
BASOS PCT: 1 %
EOS ABS: 0.1 10*3/uL (ref 0.0–0.7)
Eosinophils Relative: 6 %
HCT: 28 % — ABNORMAL LOW (ref 39.0–52.0)
HEMOGLOBIN: 9.6 g/dL — AB (ref 13.0–17.0)
LYMPHS PCT: 26 %
Lymphs Abs: 0.4 10*3/uL — ABNORMAL LOW (ref 0.7–4.0)
MCH: 30.7 pg (ref 26.0–34.0)
MCHC: 34.3 g/dL (ref 30.0–36.0)
MCV: 89.5 fL (ref 78.0–100.0)
MONO ABS: 0.4 10*3/uL (ref 0.1–1.0)
Monocytes Relative: 24 %
NEUTROS PCT: 43 %
Neutro Abs: 0.6 10*3/uL — ABNORMAL LOW (ref 1.7–7.7)
PLATELETS: 90 10*3/uL — AB (ref 150–400)
RBC: 3.13 MIL/uL — ABNORMAL LOW (ref 4.22–5.81)
RDW: 15.6 % — ABNORMAL HIGH (ref 11.5–15.5)
WBC MORPHOLOGY: INCREASED
WBC: 1.5 10*3/uL — AB (ref 4.0–10.5)

## 2016-12-27 LAB — BASIC METABOLIC PANEL
Anion gap: 5 (ref 5–15)
BUN: 14 mg/dL (ref 6–20)
CO2: 18 mmol/L — ABNORMAL LOW (ref 22–32)
CREATININE: 1.27 mg/dL — AB (ref 0.61–1.24)
Calcium: 8.5 mg/dL — ABNORMAL LOW (ref 8.9–10.3)
Chloride: 112 mmol/L — ABNORMAL HIGH (ref 101–111)
GFR, EST AFRICAN AMERICAN: 58 mL/min — AB (ref >60)
GFR, EST NON AFRICAN AMERICAN: 50 mL/min — AB (ref >60)
Glucose, Bld: 92 mg/dL (ref 65–99)
POTASSIUM: 4 mmol/L (ref 3.5–5.1)
SODIUM: 135 mmol/L (ref 135–145)

## 2016-12-27 LAB — PATHOLOGIST SMEAR REVIEW

## 2016-12-27 MED ORDER — METRONIDAZOLE 500 MG PO TABS
500.0000 mg | ORAL_TABLET | Freq: Three times a day (TID) | ORAL | Status: DC
  Administered 2016-12-27: 500 mg via ORAL
  Filled 2016-12-27: qty 1

## 2016-12-27 MED ORDER — CEFUROXIME AXETIL 250 MG PO TABS: 500.0000 mg | ORAL_TABLET | Freq: Two times a day (BID) | ORAL | Status: DC

## 2016-12-27 MED ORDER — METRONIDAZOLE 500 MG PO TABS: 500.0000 mg | ORAL_TABLET | Freq: Three times a day (TID) | ORAL | 0 refills | Status: AC

## 2016-12-27 MED ORDER — HEPARIN SOD (PORK) LOCK FLUSH 100 UNIT/ML IV SOLN
500.0000 [IU] | INTRAVENOUS | Status: AC | PRN
  Administered 2016-12-27: 500 [IU]
  Filled 2016-12-27: qty 5

## 2016-12-27 MED ORDER — CEFUROXIME AXETIL 500 MG PO TABS: 500.0000 mg | ORAL_TABLET | Freq: Two times a day (BID) | ORAL | 0 refills | Status: AC

## 2016-12-27 NOTE — Discharge Summary (Signed)
Physician Discharge Summary  Paul Keith GMW:[id number] DOB: 1930-09-27 DOA: 12/24/2016  PCP: Octavio Graves, DO  Admit date: 12/24/2016 Discharge date: 12/27/2016  Admitted From: Home Disposition:  Home   Recommendations for Outpatient Follow-up:  1. Follow up with PCP in 1-2 weeks 2. Please notify your oncologist that you were admitted to the hospital 3. Take new prescriptions as prescribed 4. Monitor for return of your fever 5. Please obtain BMP/CBCD in one week  Home Health: No Equipment/Devices: None  Discharge Condition: Stable CODE STATUS: Full code Diet recommendation: Heart Healthy  Brief/Interim Summary: Paul Struttonis an 83 y.o.malewith hx of NHL B cell, mixed plasma cell lymphoma, undergoing chemotherapy, last admitted a month ago for neutropenic fever, hx of anemia and diverticulosis, found to have temp of 102 last night and was told to come to the ER.He has a port a cath, but denied pain, erythema, chills, coughs, or GU symptoms. Work up in the ER showed WBC of 4.4I with lymphophillic predominant, clear CXR and negative UA. His Cr was elevated at 1.7. He has no rash. He was given IV Zosyn and IV Vancomycinand hospitalist was asked to admit him for febrile neutropenia. He has no hypotension, but his lactic acid was slightly elevated to 2.6. He was pan cultured.  He had persistent diarrhea and was given Immodium.  Discharge Diagnoses:  Principal Problem:   Neutropenic fever ([place]) Active Problems:   Anemia   IgM monoclonal gammopathy of uncertain significance   Waldenstrom macroglobulinemia (HCC)   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HCC)  Neutropenic fever - transition to Ceftin and Flagyl - continue above antibiotics outpatient - He has received Neulasta about a week ago - C. Diff negative, gastro panel pending - blood cultures pending  Hyponatremia - improved with fluid resuscitation  Hypokalemia - repeat BMP in 1 week  AKI on  CKD - Baseline Cr is normal at 1.0 (Suspect pre renal) - improved with hydration - repeat BMP in 1 week  Discharge Instructions  Discharge Instructions    Call MD for:  difficulty breathing, headache or visual disturbances    Complete by:  As directed    Call MD for:  extreme fatigue    Complete by:  As directed    Call MD for:  hives    Complete by:  As directed    Call MD for:  persistant dizziness or light-headedness    Complete by:  As directed    Call MD for:  persistant nausea and vomiting    Complete by:  As directed    Call MD for:  severe uncontrolled pain    Complete by:  As directed    Call MD for:  temperature >100.4    Complete by:  As directed    Diet - low sodium heart healthy    Complete by:  As directed    Discharge instructions    Complete by:  As directed    Take prescriptions as prescribed Please return to the hospital if symptoms return   Increase activity slowly    Complete by:  As directed      Allergies as of 12/27/2016   No Known Allergies     Medication List    STOP taking these medications   cefdinir 300 MG capsule Commonly known as:  OMNICEF   dexamethasone 4 MG tablet Commonly known as:  DECADRON   RITUXAN IV     TAKE these medications   acetaminophen 500 MG tablet Commonly known as:  TYLENOL Take 500  mg by mouth every 6 (six) hours as needed for mild pain.   B-12 PO Take 1 tablet by mouth daily.   B-complex with vitamin C tablet Take 1 tablet by mouth daily.   bendamustine in sodium chloride 0.9 % 50 mL Inject into the vein once. Every 28 days   cefUROXime 500 MG tablet Commonly known as:  CEFTIN Take 1 tablet (500 mg total) by mouth 2 (two) times daily with a meal.   cholecalciferol 1000 units tablet Commonly known as:  VITAMIN D Take 1,000 Units by mouth daily.   cyanocobalamin 100 MCG tablet Take by mouth daily. Pt reports unsure of dosage, takes once daily   loperamide 2 MG capsule Commonly known as:   IMODIUM Take 1 capsule (2 mg total) by mouth every 4 (four) hours as needed for diarrhea or loose stools.   metroNIDAZOLE 500 MG tablet Commonly known as:  FLAGYL Take 1 tablet (500 mg total) by mouth every 8 (eight) hours. What changed:  when to take this   multivitamin with minerals Tabs tablet Take 1 tablet by mouth daily.   tamsulosin 0.4 MG Caps capsule Commonly known as:  FLOMAX Take 0.8 mg by mouth daily after supper.      Follow-up Information    Paul BUTLER, DO Follow up.   Why:  call to make appointment for 1 week Contact information: [address] [place] [address] [phone]        Paul Pane, PA-C Follow up on 01/04/2017.   Specialty:  Oncology Why:  appointment time 8:40 AM Contact information: [place] [address] [phone]          No Known Allergies  Consultations:  None   Procedures/Studies: Dg Chest Port 1 View  Result Date: 12/24/2016 CLINICAL DATA:  WEAKNESS AND FEVER SINCE LAST NIGHT, LYMPHOMA, LAST CHEMO WAS 2 WEEKS AGO, FORMER SMOKER SINCE 1963 EXAM: PORTABLE CHEST - 1 VIEW COMPARISON:  11/25/2016 FINDINGS: Lungs are clear. Left lateral costophrenic angle is partially excluded. Heart size and mediastinal contours are within normal limits. No effusion. Visualized bones unremarkable. Right IJ port catheter to the mid SVC. IMPRESSION: No acute cardiopulmonary disease. Electronically Signed   By: Lucrezia Europe M.D.   On: 12/24/2016 14:01      Subjective: Patient reports he feels fantastic this am and feels ready to go home.  He mentions he has been able to ambulate and denies any weakness.  Voices that he has eaten breakfast and lunch today without problem and endorses that he has a follow up appointment with oncology next week.  Understands to return to the hospital with any return of symptoms.  Discharge Exam: Vitals:   12/26/16 2142 12/27/16 0700  BP: (!) 93/48 (!) 119/55  Pulse: 60 64  Resp: 18 18   Temp: 97.9 F (36.6 C) 97.7 F (36.5 C)   Vitals:   12/26/16 0434 12/26/16 1431 12/26/16 2142 12/27/16 0700  BP: (!) 92/55 (!) 124/54 (!) 93/48 (!) 119/55  Pulse: 80 73 60 64  Resp: 18 18 18 18   Temp: 98 F (36.7 C) 98.5 F (36.9 C) 97.9 F (36.6 C) 97.7 F (36.5 C)  TempSrc: Oral Oral Oral Oral  SpO2: 100% 100% 100% 100%  Weight:      Height:        General: Pt is alert, awake, not in acute distress Cardiovascular: RRR, S1/S2 +, no rubs, no gallops Respiratory: CTA bilaterally, no wheezing, no rhonchi Abdominal: Soft, NT,  ND, bowel sounds + Extremities: no edema, no cyanosis    The results of significant diagnostics from this hospitalization (including imaging, microbiology, ancillary and laboratory) are listed below for reference.     Microbiology: Recent Results (from the past 240 hour(s))  Blood Culture (routine x 2)     Status: None (Preliminary result)   Collection Time: 12/24/16  1:44 PM  Result Value Ref Range Status   Specimen Description BLOOD LEFT ARM DRAWN BY IV THERAPY  Final   Special Requests BOTTLES DRAWN AEROBIC AND ANAEROBIC 10CC  Final   Culture PENDING  Incomplete   Report Status PENDING  Incomplete  Blood Culture (routine x 2)     Status: None (Preliminary result)   Collection Time: 12/24/16  1:56 PM  Result Value Ref Range Status   Specimen Description BLOOD LEFT WRIST  Final   Special Requests BOTTLES DRAWN AEROBIC AND ANAEROBIC [facility] EACH  Final   Culture PENDING  Incomplete   Report Status PENDING  Incomplete  Urine culture     Status: None   Collection Time: 12/24/16  4:40 PM  Result Value Ref Range Status   Specimen Description URINE, CLEAN CATCH  Final   Special Requests NONE  Final   Culture   Final    NO GROWTH Performed at [facility], [place] [address] [place], [place] [address]    Report Status 12/26/2016 FINAL  Final  C difficile quick scan w PCR reflex     Status: None   Collection Time: 12/26/16  2:14 PM  Result Value  Ref Range Status   C Diff antigen NEGATIVE NEGATIVE Final   C Diff toxin NEGATIVE NEGATIVE Final   C Diff interpretation No C. difficile detected.  Final     Labs: BNP (last 3 results) No results for input(s): BNP in the last 8760 hours. Basic Metabolic Panel:  Recent Labs Lab 12/24/16 1344 12/25/16 1759 12/26/16 0619 12/27/16 0745  NA 126* 133* 135 135  K 4.3 3.1* 2.8* 4.0  CL 96* 108 110 112*  CO2 23 16* 20* 18*  GLUCOSE 138* 132* 106* 92  BUN 26* 17 15 14   CREATININE 1.71* 1.35* 1.32* 1.27*  CALCIUM 8.3* 8.0* 8.2* 8.5*   Liver Function Tests:  Recent Labs Lab 12/24/16 1344  AST 16  ALT 12*  ALKPHOS 50  BILITOT 0.6  PROT 5.6*  ALBUMIN 3.4*    Recent Labs Lab 12/24/16 1344  LIPASE 16   No results for input(s): AMMONIA in the last 168 hours. CBC:  Recent Labs Lab 12/24/16 1344 12/25/16 0653 12/26/16 0619 12/27/16 0405  WBC 1.0* 0.6* 0.9* 1.5*  NEUTROABS  --  0.0* 0.3* 0.6*  HGB 10.2* 9.3* 8.5* 9.6*  HCT 29.7* 27.0* 24.5* 28.0*  MCV 89.7 90.0 89.1 89.5  PLT 81* 69* 72* 90*   Cardiac Enzymes:  Recent Labs Lab 12/24/16 1344  TROPONINI <0.03   BNP: Invalid input(s): POCBNP CBG: No results for input(s): GLUCAP in the last 168 hours. D-Dimer No results for input(s): DDIMER in the last 72 hours. Hgb A1c No results for input(s): HGBA1C in the last 72 hours. Lipid Profile No results for input(s): CHOL, HDL, LDLCALC, TRIG, CHOLHDL, LDLDIRECT in the last 72 hours. Thyroid function studies No results for input(s): TSH, T4TOTAL, T3FREE, THYROIDAB in the last 72 hours.  Invalid input(s): FREET3 Anemia work up No results for input(s): VITAMINB12, FOLATE, FERRITIN, TIBC, IRON, RETICCTPCT in the last 72 hours. Urinalysis    Component Value Date/Time  COLORURINE AMBER (A) 12/24/2016 1640   APPEARANCEUR HAZY (A) 12/24/2016 1640   LABSPEC 1.017 12/24/2016 1640   PHURINE 5.0 12/24/2016 1640   GLUCOSEU NEGATIVE 12/24/2016 1640   HGBUR NEGATIVE  12/24/2016 1640   BILIRUBINUR NEGATIVE 12/24/2016 1640   KETONESUR NEGATIVE 12/24/2016 1640   PROTEINUR NEGATIVE 12/24/2016 1640   NITRITE NEGATIVE 12/24/2016 1640   LEUKOCYTESUR NEGATIVE 12/24/2016 1640   Sepsis Labs Invalid input(s): PROCALCITONIN,  WBC,  LACTICIDVEN Microbiology Recent Results (from the past 240 hour(s))  Blood Culture (routine x 2)     Status: None (Preliminary result)   Collection Time: 12/24/16  1:44 PM  Result Value Ref Range Status   Specimen Description BLOOD LEFT ARM DRAWN BY IV THERAPY  Final   Special Requests BOTTLES DRAWN AEROBIC AND ANAEROBIC 10CC  Final   Culture PENDING  Incomplete   Report Status PENDING  Incomplete  Blood Culture (routine x 2)     Status: None (Preliminary result)   Collection Time: 12/24/16  1:56 PM  Result Value Ref Range Status   Specimen Description BLOOD LEFT WRIST  Final   Special Requests BOTTLES DRAWN AEROBIC AND ANAEROBIC [facility] EACH  Final   Culture PENDING  Incomplete   Report Status PENDING  Incomplete  Urine culture     Status: None   Collection Time: 12/24/16  4:40 PM  Result Value Ref Range Status   Specimen Description URINE, CLEAN CATCH  Final   Special Requests NONE  Final   Culture   Final    NO GROWTH Performed at [facility], [place] [address] [place],  [address]    Report Status 12/26/2016 FINAL  Final  C difficile quick scan w PCR reflex     Status: None   Collection Time: 12/26/16  2:14 PM  Result Value Ref Range Status   C Diff antigen NEGATIVE NEGATIVE Final   C Diff toxin NEGATIVE NEGATIVE Final   C Diff interpretation No C. difficile detected.  Final     Time coordinating discharge: 35 minutes  SIGNED:   Loretha Stapler, MD  [facility] 12/27/2016, 3:24 PM Pager [phone] If 7PM-7AM, please contact night-coverage [email or web address] Password [email or web address]

## 2016-12-27 NOTE — Progress Notes (Signed)
Patient discharged with instructions given on medications ,and follow up visits,patient verbalized understanding. Prescriptions sent to Pharmacy of choice documented in AVS. Accompanied by staff to an awaiting vehicle.

## 2016-12-28 ENCOUNTER — Other Ambulatory Visit (HOSPITAL_COMMUNITY): Payer: Self-pay | Admitting: Pharmacist

## 2016-12-28 ENCOUNTER — Ambulatory Visit (HOSPITAL_COMMUNITY): Payer: Medicare Other | Admitting: Hematology & Oncology

## 2016-12-28 ENCOUNTER — Ambulatory Visit (HOSPITAL_COMMUNITY): Payer: Medicare Other

## 2016-12-29 ENCOUNTER — Ambulatory Visit (HOSPITAL_COMMUNITY): Payer: Medicare Other

## 2016-12-29 LAB — GASTROINTESTINAL PANEL BY PCR, STOOL (REPLACES STOOL CULTURE)
ADENOVIRUS F40/41: NOT DETECTED
Astrovirus: NOT DETECTED
CAMPYLOBACTER SPECIES: NOT DETECTED
CRYPTOSPORIDIUM: NOT DETECTED
Cyclospora cayetanensis: NOT DETECTED
ENTEROTOXIGENIC E COLI (ETEC): NOT DETECTED
Entamoeba histolytica: NOT DETECTED
Enteroaggregative E coli (EAEC): NOT DETECTED
Enteropathogenic E coli (EPEC): DETECTED — AB
GIARDIA LAMBLIA: NOT DETECTED
NOROVIRUS GI/GII: NOT DETECTED
PLESIMONAS SHIGELLOIDES: NOT DETECTED
ROTAVIRUS A: NOT DETECTED
SHIGELLA/ENTEROINVASIVE E COLI (EIEC): NOT DETECTED
Salmonella species: NOT DETECTED
Sapovirus (I, II, IV, and V): NOT DETECTED
Shiga like toxin producing E coli (STEC): NOT DETECTED
VIBRIO CHOLERAE: NOT DETECTED
Vibrio species: NOT DETECTED
YERSINIA ENTEROCOLITICA: NOT DETECTED

## 2016-12-29 LAB — CULTURE, BLOOD (ROUTINE X 2)
CULTURE: NO GROWTH
Culture: NO GROWTH

## 2017-01-04 ENCOUNTER — Encounter (HOSPITAL_COMMUNITY): Payer: Medicare Other | Attending: Oncology

## 2017-01-04 ENCOUNTER — Encounter (HOSPITAL_COMMUNITY): Payer: Self-pay | Admitting: Oncology

## 2017-01-04 ENCOUNTER — Encounter (HOSPITAL_COMMUNITY): Payer: Medicare Other | Attending: Oncology | Admitting: Oncology

## 2017-01-04 ENCOUNTER — Ambulatory Visit (HOSPITAL_COMMUNITY): Payer: Medicare Other

## 2017-01-04 ENCOUNTER — Ambulatory Visit (HOSPITAL_COMMUNITY): Payer: Medicare Other | Admitting: Hematology & Oncology

## 2017-01-04 VITALS — BP 101/49 | HR 91 | Temp 98.2°F | Resp 18

## 2017-01-04 DIAGNOSIS — C8300 Small cell B-cell lymphoma, unspecified site: Secondary | ICD-10-CM | POA: Insufficient documentation

## 2017-01-04 DIAGNOSIS — Z5112 Encounter for antineoplastic immunotherapy: Secondary | ICD-10-CM | POA: Diagnosis not present

## 2017-01-04 DIAGNOSIS — Z5111 Encounter for antineoplastic chemotherapy: Secondary | ICD-10-CM

## 2017-01-04 DIAGNOSIS — Z5189 Encounter for other specified aftercare: Secondary | ICD-10-CM | POA: Diagnosis not present

## 2017-01-04 DIAGNOSIS — C880 Waldenstrom macroglobulinemia: Secondary | ICD-10-CM

## 2017-01-04 DIAGNOSIS — Z87891 Personal history of nicotine dependence: Secondary | ICD-10-CM | POA: Insufficient documentation

## 2017-01-04 DIAGNOSIS — D472 Monoclonal gammopathy: Secondary | ICD-10-CM

## 2017-01-04 LAB — CBC WITH DIFFERENTIAL/PLATELET
Basophils Absolute: 0 10*3/uL (ref 0.0–0.1)
Basophils Relative: 0 %
EOS PCT: 2 %
Eosinophils Absolute: 0.1 10*3/uL (ref 0.0–0.7)
HEMATOCRIT: 33.5 % — AB (ref 39.0–52.0)
Hemoglobin: 11.4 g/dL — ABNORMAL LOW (ref 13.0–17.0)
LYMPHS ABS: 0.6 10*3/uL — AB (ref 0.7–4.0)
LYMPHS PCT: 18 %
MCH: 30.6 pg (ref 26.0–34.0)
MCHC: 34 g/dL (ref 30.0–36.0)
MCV: 90.1 fL (ref 78.0–100.0)
MONO ABS: 0.4 10*3/uL (ref 0.1–1.0)
MONOS PCT: 10 %
NEUTROS ABS: 2.4 10*3/uL (ref 1.7–7.7)
Neutrophils Relative %: 70 %
Platelets: 128 10*3/uL — ABNORMAL LOW (ref 150–400)
RBC: 3.72 MIL/uL — ABNORMAL LOW (ref 4.22–5.81)
RDW: 15.9 % — AB (ref 11.5–15.5)
WBC: 3.4 10*3/uL — ABNORMAL LOW (ref 4.0–10.5)

## 2017-01-04 LAB — COMPREHENSIVE METABOLIC PANEL
ALT: 26 U/L (ref 17–63)
AST: 25 U/L (ref 15–41)
Albumin: 3.6 g/dL (ref 3.5–5.0)
Alkaline Phosphatase: 58 U/L (ref 38–126)
Anion gap: 6 (ref 5–15)
BILIRUBIN TOTAL: 0.4 mg/dL (ref 0.3–1.2)
BUN: 16 mg/dL (ref 6–20)
CHLORIDE: 102 mmol/L (ref 101–111)
CO2: 27 mmol/L (ref 22–32)
CREATININE: 1.02 mg/dL (ref 0.61–1.24)
Calcium: 9.1 mg/dL (ref 8.9–10.3)
Glucose, Bld: 103 mg/dL — ABNORMAL HIGH (ref 65–99)
POTASSIUM: 3.8 mmol/L (ref 3.5–5.1)
Sodium: 135 mmol/L (ref 135–145)
TOTAL PROTEIN: 5.7 g/dL — AB (ref 6.5–8.1)

## 2017-01-04 LAB — URIC ACID: URIC ACID, SERUM: 5.4 mg/dL (ref 4.4–7.6)

## 2017-01-04 LAB — LACTATE DEHYDROGENASE: LDH: 109 U/L (ref 98–192)

## 2017-01-04 LAB — PHOSPHORUS: PHOSPHORUS: 3 mg/dL (ref 2.5–4.6)

## 2017-01-04 LAB — MAGNESIUM: Magnesium: 1.9 mg/dL (ref 1.7–2.4)

## 2017-01-04 MED ORDER — RITUXIMAB CHEMO INJECTION 500 MG/50ML
375.0000 mg/m2 | Freq: Once | INTRAVENOUS | Status: AC
  Administered 2017-01-04: 700 mg via INTRAVENOUS
  Filled 2017-01-04: qty 20

## 2017-01-04 MED ORDER — ACETAMINOPHEN 325 MG PO TABS
650.0000 mg | ORAL_TABLET | Freq: Once | ORAL | Status: AC
  Administered 2017-01-04: 650 mg via ORAL
  Filled 2017-01-04: qty 2

## 2017-01-04 MED ORDER — SODIUM CHLORIDE 0.9 % IV SOLN
90.0000 mg/m2 | Freq: Once | INTRAVENOUS | Status: AC
  Administered 2017-01-04: 175 mg via INTRAVENOUS
  Filled 2017-01-04: qty 7

## 2017-01-04 MED ORDER — HEPARIN SOD (PORK) LOCK FLUSH 100 UNIT/ML IV SOLN
500.0000 [IU] | Freq: Once | INTRAVENOUS | Status: AC | PRN
  Administered 2017-01-04: 500 [IU]
  Filled 2017-01-04: qty 5

## 2017-01-04 MED ORDER — DEXAMETHASONE SODIUM PHOSPHATE 10 MG/ML IJ SOLN
10.0000 mg | Freq: Once | INTRAMUSCULAR | Status: AC
  Administered 2017-01-04: 10 mg via INTRAVENOUS
  Filled 2017-01-04: qty 1

## 2017-01-04 MED ORDER — DIPHENHYDRAMINE HCL 25 MG PO CAPS
50.0000 mg | ORAL_CAPSULE | Freq: Once | ORAL | Status: AC
  Administered 2017-01-04: 50 mg via ORAL
  Filled 2017-01-04: qty 2

## 2017-01-04 MED ORDER — PALONOSETRON HCL INJECTION 0.25 MG/5ML
0.2500 mg | Freq: Once | INTRAVENOUS | Status: AC
  Administered 2017-01-04: 0.25 mg via INTRAVENOUS
  Filled 2017-01-04: qty 5

## 2017-01-04 MED ORDER — SODIUM CHLORIDE 0.9 % IV SOLN
Freq: Once | INTRAVENOUS | Status: AC
  Administered 2017-01-04: 10:00:00 via INTRAVENOUS

## 2017-01-04 MED ORDER — SODIUM CHLORIDE 0.9% FLUSH
10.0000 mL | INTRAVENOUS | Status: DC | PRN
  Administered 2017-01-04: 10 mL
  Filled 2017-01-04: qty 10

## 2017-01-04 NOTE — Patient Instructions (Signed)
[facility] Discharge Instructions for Patients Receiving Chemotherapy   Beginning January 23rd 2017 lab work for the Cancer Center will be done in the  Main lab at [facility] on 1st floor. If you have a lab appointment with the Cancer Center please come in thru the  Main Entrance and check in at the main information desk   Today you received the following chemotherapy agents   To help prevent nausea and vomiting after your treatment, we encourage you to take your nausea medication     If you develop nausea and vomiting, or diarrhea that is not controlled by your medication, call the clinic.  The clinic phone number is [phone]. Office hours are Monday-Friday 8:30am-5:00pm.  BELOW ARE SYMPTOMS THAT SHOULD BE REPORTED IMMEDIATELY:  *FEVER GREATER THAN 101.0 F  *CHILLS WITH OR WITHOUT FEVER  NAUSEA AND VOMITING THAT IS NOT CONTROLLED WITH YOUR NAUSEA MEDICATION  *UNUSUAL SHORTNESS OF BREATH  *UNUSUAL BRUISING OR BLEEDING  TENDERNESS IN MOUTH AND THROAT WITH OR WITHOUT PRESENCE OF ULCERS  *URINARY PROBLEMS  *BOWEL PROBLEMS  UNUSUAL RASH Items with * indicate a potential emergency and should be followed up as soon as possible. If you have an emergency after office hours please contact your primary care physician or go to the nearest emergency department.  Please call the clinic during office hours if you have any questions or concerns.   You may also contact the Patient Navigator at [phone] should you have any questions or need assistance in obtaining follow up care.      Resources For Cancer Patients and their Caregivers ? American Cancer Society: Can assist with transportation, wigs, general needs, runs Look Good Feel Better.        1-888-227-6333 ? Cancer Care: Provides financial assistance, online support groups, medication/co-pay assistance.  1-800-813-HOPE (4673) ? [facility] Assists [place] cancer  patients and their families through emotional , educational and financial support.  336-427-4357 ? [place] DSS Where to apply for food stamps, Medicaid and utility assistance. 336-342-1394 ? RCATS: Transportation to medical appointments. 336-347-2287 ? Social Security Administration: May apply for disability if have a Stage IV cancer. 336-342-7796 1-800-772-1213 ? [place] Aging, Disability and Transit Services: Assists with nutrition, care and transit needs. 336-349-2343         

## 2017-01-04 NOTE — Patient Instructions (Addendum)
[facility] Discharge Instructions  RECOMMENDATIONS MADE BY THE CONSULTANT AND ANY TEST RESULTS WILL BE SENT TO YOUR REFERRING PHYSICIAN.  You were seen today by Kirby Crigler PA-C. You will see Dr. Talbert Cage for follow up with your next chemotherapy appointment.   Please see Amy as you leave for appointments.     Thank you for choosing [facility] at [facility] to provide your oncology and hematology care.  To afford each patient quality time with our provider, please arrive at least 15 minutes before your scheduled appointment time.    If you have a lab appointment with the [facility] please come in thru the  Main Entrance and check in at the main information desk  You need to re-schedule your appointment should you arrive 10 or more minutes late.  We strive to give you quality time with our providers, and arriving late affects you and other patients whose appointments are after yours.  Also, if you no show three or more times for appointments you may be dismissed from the clinic at the providers discretion.     Again, thank you for choosing [facility].  Our hope is that these requests will decrease the amount of time that you wait before being seen by our physicians.       _____________________________________________________________  Should you have questions after your visit to [facility], please contact our office at [phone] between the hours of 8:30 a.m. and 4:30 p.m.  Voicemails left after 4:30 p.m. will not be returned until the following business day.  For prescription refill requests, have your pharmacy contact our office.       Resources For Cancer Patients and their Caregivers ? American Cancer Society: Can assist with transportation, wigs, general needs, runs Look Good Feel Better.        (331) 296-6134 ? Cancer Care: Provides financial assistance, online support groups,  medication/co-pay assistance.  1-800-813-HOPE (901) 247-0303) ? [facility] Assists [place] cancer patients and their families through emotional , educational and financial support.  310-523-1956 ? [place] DSS Where to apply for food stamps, Medicaid and utility assistance. 530 478 5381 ? RCATS: Transportation to medical appointments. (469) 198-0286 ? Social Security Administration: May apply for disability if have a Stage IV cancer. 330-392-0916 615-592-1017 ? [employer], Disability and Transit Services: Assists with nutrition, care and transit needs. [place] Support Programs: @10RELATIVEDAYS @ > Cancer Support Group  2nd Tuesday of the month 1pm-2pm, Journey Room  > Creative Journey  3rd Tuesday of the month 1130am-1pm, Journey Room  > Look Good Feel Better  1st Wednesday of the month 10am-12 noon, Journey Room (Call [place] to register [phone])

## 2017-01-04 NOTE — Progress Notes (Signed)
Chemotherapy given today per orders. Patient tolerated it well, no problems. Vitals stable and discharged home ambulatory from clinic. Follow up as scheduled.

## 2017-01-04 NOTE — Assessment & Plan Note (Addendum)
NHL versus lymphoplasmacytic lymphoma with bone marrow involvement complicated by possible involvement of sigmoid colon with bone marrow aspiration and biopsy on 05/06/2016 showing extensive involvement by NHL B cell, admixed plasma cell component to a lesser degree which also shows a kappa light chain restriction (DDX: lymphoplasmacytic lymphoma, marginal zone lymphoma, follicular lymphoma with plasma cell differentiation, splenic lymphoma).  Low grade process is favored.  PET imaging demonstrates diffuse osseous uptake secondary to NHL.  Oncology history updated.  Pre-treatment labs today: CBC diff, CMET, LDH, Uric acid, phosphorus, and magnesium.  I personally reviewed and went over laboratory results with the patient.  Labs today satisfy treatment parameters.  I have added additional labs including SPEP+IFE, light chain assay, and B2M.  Chart is reviewed.  Two hospitalizations noted since last cycle of chemotherapy secondary to neutropenic fevers.  He reports being asymptomatic on both of these occasions.  Moving forward, similar issues occur, and he remains the asymptomatic, and may not be unreasonable to place him on an antibiotic empirically (Levaquin versus Cipro versus Bactrim).  He has seen Dr. Ladona Horns in consultation regarding his bowel involvement/issue.  Per the patient, Dr. Ladona Horns does not feel that there is lymphomatous involvement of colon.  As a result, he wishes to follow conservatively.  He is advised to report to the ED with any issues.  Last cycle of therapy was held x 1 week due to severe neutropenia.  He is receiving Neulasta support with treatment.  He will return in 4 weeks for follow-up with pretreatment labs as well.  He will call with any issues in the interim.

## 2017-01-04 NOTE — Progress Notes (Signed)
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 male returns for followup of  NHL versus lymphoplasmacytic lymphoma with bone marrow involvement complicated by possible involvement of sigmoid colon with bone marrow aspiration and biopsy on 05/06/2016 showing extensive involvement by NHL B cell, admixed plasma cell component to a lesser degree which also shows a kappa light chain restriction (DDX: lymphoplasmacytic lymphoma, marginal zone lymphoma, follicular lymphoma with plasma cell differentiation, splenic lymphoma).  Low grade process is favored.  PET imaging demonstrates diffuse osseous uptake secondary to NHL. 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 The pattern is somewhat nonspecific but the findings are consistent with non-Hodgkin B-cell lymphoma.      05/10/2016 Pathology Results    Bone Marrow, Aspirate,Biopsy, and Clot, right ilium - HYPERCELLULAR BONE MARROW WITH EXTENSIVE INVOLVEMENT BY NON-HODGKIN'S B-CELL LYMPHOMA.      05/13/2016 PET scan    Splenomegaly and mild diffuse increase metabolic activity in the spleen consistent with lymphomatous involvement. Diffuse osseous uptake likely due to diffuse lymphoma. Recommend correlation with any marrow stimulating drugs or rebound phenomenon.      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 Adjacent small amount of fluid has decreased since prior examination with interval clearing of previously noted small amount of extraluminal air.      06/03/2016 - 07/23/2016 Antibody Plan    Single-agent Rituxan      08/17/2016 -  Chemotherapy    The patient had palonosetron (ALOXI) injection 0.25 mg, 0.25 mg, Intravenous,  Once, 4 of 6 cycles  pegfilgrastim (NEULASTA ONPRO KIT) injection 6 mg, 6 mg, Subcutaneous, Once, 4 of 6 cycles  riTUXimab (RITUXAN) 700 mg in sodium chloride 0.9 % 250 mL (2.1875 mg/mL) chemo infusion, 375 mg/m2 = 700 mg, Intravenous,  Once, 4 of 6 cycles  bendamustine (BENDEKA) 175 mg in sodium chloride 0.9 % 50 mL (3.0702 mg/mL) chemo infusion, 90 mg/m2 = 175 mg, Intravenous,  Once, 4 of 6 cycles  for chemotherapy treatment.        11/25/2016 - 11/28/2016 Hospital Admission    Admit date: 11/25/2016 Admission diagnosis: Febrile neutropenia Additional comments: Gastroenteritis      12/24/2016 - 12/27/2016 Hospital Admission    Admit date: 12/24/2016 Admission diagnosis: Neutropenic fever Additional comments:       Patient's chart is reviewed and recent hospitalization for neutropenic fever is appreciated.  Discharge labs demonstrate a ANC of 0.6 and platelet count of 90,000.  He reports feeling well particularly over the last 3 days.  I reviewed his recent hospitalizations with the patient.  He notes that although he had fever, he was completely asymptomatic.  He felt well to.  He denied any specific complaints.  Unfortunately, his wife has a cold.  I provided him with neutropenia precautions education.  Review of Systems  Constitutional: Negative.  Negative for chills, fever and weight  loss.  HENT: Negative.   Eyes: Negative.   Respiratory: Negative.  Negative for cough.   Cardiovascular: Negative.  Negative for chest pain.  Gastrointestinal: Negative.  Negative for  blood in stool, constipation, diarrhea, melena, nausea and vomiting.  Genitourinary: Negative.   Musculoskeletal: Negative.   Skin: Negative.   Neurological: Negative.  Negative for weakness.  Endo/Heme/Allergies: Negative.   Psychiatric/Behavioral: Negative.     Past Medical History:  Diagnosis Date  . Anemia 04/22/2016  . Diverticulosis    pt reports recent admission for diverticulosis perforation this past week  . 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 Pancytopenia ([facility]) 04/22/2016    Past Surgical History:  Procedure Laterality Date  . CATARACT EXTRACTION Bilateral   . TONSILLECTOMY      History reviewed. No pertinent family history.  Social History   Social History  . Marital status: Married    Spouse name: N/A  . Number of children: N/A  . Years of education: N/A   Social History Main Topics  . Smoking status: Former Smoker    Quit date: 09/15/1962  . Smokeless tobacco: Never Used  . Alcohol use Yes     Comment: OCCASSIONAL  . Drug use: No  . Sexual activity: Not Asked     Comment: married   Other Topics Concern  . None   Social History Narrative  . None     PHYSICAL EXAMINATION  ECOG PERFORMANCE STATUS: 1 - Symptomatic but completely ambulatory  Vitals:   01/04/17 0911  BP: (!) 144/71  Pulse: 95  Resp: 18  Temp: 97.4 F (36.3 C)    GENERAL:alert, no distress, well nourished, well developed, comfortable, cooperative, smiling and unaccompanied SKIN: skin color, texture, turgor are normal, no rashes or significant lesions HEAD: Normocephalic, No masses, lesions, tenderness or abnormalities EYES: normal, EOMI, Conjunctiva are pink and non-injected EARS: External ears normal OROPHARYNX:lips, buccal mucosa, and tongue normal and mucous membranes are moist  NECK: supple, no adenopathy, trachea midline LYMPH:  no palpable lymphadenopathy BREAST:not examined LUNGS: clear to  auscultation and percussion HEART: regular rate & rhythm, no murmurs, no gallops, S1 normal and S2 normal ABDOMEN:abdomen soft and normal bowel sounds BACK: Back symmetric, no curvature. EXTREMITIES:less then 2 second capillary refill, no joint deformities, effusion, or inflammation, no skin discoloration, no cyanosis  NEURO: alert & oriented x 3 with fluent speech, no focal motor/sensory deficits, gait normal   LABORATORY DATA: CBC    Component Value Date/Time   WBC 3.4 (L) 01/04/2017 0900   RBC 3.72 (L) 01/04/2017 0900   HGB 11.4 (L) 01/04/2017 0900   HCT 33.5 (L) 01/04/2017 0900   PLT 128 (L) 01/04/2017 0900   MCV 90.1 01/04/2017 0900   MCH 30.6 01/04/2017 0900   MCHC 34.0 01/04/2017 0900   RDW 15.9 (H) 01/04/2017 0900   LYMPHSABS 0.6 (L) 01/04/2017 0900   MONOABS 0.4 01/04/2017 0900   EOSABS 0.1 01/04/2017 0900   BASOSABS 0.0 01/04/2017 0900      Chemistry      Component Value Date/Time   NA 135 01/04/2017 0900   K 3.8 01/04/2017 0900   CL 102 01/04/2017 0900   CO2 27 01/04/2017 0900   BUN 16 01/04/2017 0900   CREATININE 1.02 01/04/2017 0900      Component Value Date/Time   CALCIUM 9.1 01/04/2017 0900   ALKPHOS 58 01/04/2017 0900   AST 25 01/04/2017 0900   ALT 26 01/04/2017 0900   BILITOT  0.4 01/04/2017 0900        PENDING LABS:   RADIOGRAPHIC STUDIES:  Dg Chest Port 1 View  Result Date: 12/24/2016 CLINICAL DATA:  WEAKNESS AND FEVER SINCE LAST NIGHT, LYMPHOMA, LAST CHEMO WAS 2 WEEKS AGO, FORMER SMOKER SINCE 1963 EXAM: PORTABLE CHEST - 1 VIEW COMPARISON:  11/25/2016 FINDINGS: Lungs are clear. Left lateral costophrenic angle is partially excluded. Heart size and mediastinal contours are within normal limits. No effusion. Visualized bones unremarkable. Right IJ port catheter to the mid SVC. IMPRESSION: No acute cardiopulmonary disease. Electronically Signed   By: Lucrezia Europe M.D.   On: 12/24/2016 14:01     PATHOLOGY:    ASSESSMENT AND PLAN:  Malignant  lymphoplasmacytic lymphoma ([facility]) NHL versus lymphoplasmacytic lymphoma with bone marrow involvement complicated by possible involvement of sigmoid colon with bone marrow aspiration and biopsy on 05/06/2016 showing extensive involvement by NHL B cell, admixed plasma cell component to a lesser degree which also shows a kappa light chain restriction (DDX: lymphoplasmacytic lymphoma, marginal zone lymphoma, follicular lymphoma with plasma cell differentiation, splenic lymphoma).  Low grade process is favored.  PET imaging demonstrates diffuse osseous uptake secondary to NHL.  Oncology history updated.  Pre-treatment labs today: CBC diff, CMET, LDH, Uric acid, phosphorus, and magnesium.  I personally reviewed and went over laboratory results with the patient.  Labs today satisfy treatment parameters.  I have added additional labs including SPEP+IFE, light chain assay, and B2M.  Chart is reviewed.  Two hospitalizations noted since last cycle of chemotherapy secondary to neutropenic fevers.  He reports being asymptomatic on both of these occasions.  Moving forward, similar issues occur, and he remains the asymptomatic, and may not be unreasonable to place him on an antibiotic empirically (Levaquin versus Cipro versus Bactrim).  He has seen Dr. Ladona Horns in consultation regarding his bowel involvement/issue.  Per the patient, Dr. Ladona Horns does not feel that there is lymphomatous involvement of colon.  As a result, he wishes to follow conservatively.  He is advised to report to the ED with any issues.  Last cycle of therapy was held x 1 week due to severe neutropenia.  He is receiving Neulasta support with treatment.  He will return in 4 weeks for follow-up with pretreatment labs as well.  He will call with any issues in the interim.   ORDERS PLACED FOR THIS ENCOUNTER: Orders Placed This Encounter  Procedures  . CBC with Differential  . Comprehensive metabolic panel  . IgG, IgA, IgM  . Immunofixation  electrophoresis  . Kappa/lambda light chains  . Beta 2 microglobuline, serum  . Protein electrophoresis, serum  . Uric acid  . Phosphorus  . Lactate dehydrogenase  . Magnesium    MEDICATIONS PRESCRIBED THIS ENCOUNTER: No orders of the defined types were placed in this encounter.   THERAPY PLAN:  Continue treatment as outlined above.    All questions were answered. The patient knows to call the clinic with any problems, questions or concerns. We can certainly see the patient much sooner if necessary.  Patient and plan discussed with Dr. Twana First and she is in agreement with the aforementioned.   This note is electronically signed by: Robynn Pane, PA-C 01/04/2017 10:00 AM

## 2017-01-05 ENCOUNTER — Encounter (HOSPITAL_BASED_OUTPATIENT_CLINIC_OR_DEPARTMENT_OTHER): Payer: Medicare Other

## 2017-01-05 ENCOUNTER — Ambulatory Visit (HOSPITAL_COMMUNITY): Payer: Medicare Other

## 2017-01-05 VITALS — BP 110/48 | HR 69 | Temp 98.0°F | Resp 18

## 2017-01-05 DIAGNOSIS — Z5111 Encounter for antineoplastic chemotherapy: Secondary | ICD-10-CM

## 2017-01-05 DIAGNOSIS — C8300 Small cell B-cell lymphoma, unspecified site: Secondary | ICD-10-CM | POA: Diagnosis not present

## 2017-01-05 DIAGNOSIS — C880 Waldenstrom macroglobulinemia: Secondary | ICD-10-CM

## 2017-01-05 DIAGNOSIS — D472 Monoclonal gammopathy: Secondary | ICD-10-CM

## 2017-01-05 LAB — KAPPA/LAMBDA LIGHT CHAINS
KAPPA, LAMDA LIGHT CHAIN RATIO: 0.64 (ref 0.26–1.65)
Kappa free light chain: 8.5 mg/L (ref 3.3–19.4)
Lambda free light chains: 13.2 mg/L (ref 5.7–26.3)

## 2017-01-05 LAB — PROTEIN ELECTROPHORESIS, SERUM
A/G Ratio: 1.8 — ABNORMAL HIGH (ref 0.7–1.7)
ALBUMIN ELP: 3.4 g/dL (ref 2.9–4.4)
ALPHA-1-GLOBULIN: 0.3 g/dL (ref 0.0–0.4)
Alpha-2-Globulin: 0.4 g/dL (ref 0.4–1.0)
BETA GLOBULIN: 0.7 g/dL (ref 0.7–1.3)
GAMMA GLOBULIN: 0.4 g/dL (ref 0.4–1.8)
Globulin, Total: 1.9 — ABNORMAL LOW (ref 2.2–3.9)
M-SPIKE, %: 0.1 g/dL — AB
TOTAL PROTEIN ELP: 5.3 g/dL — AB (ref 6.0–8.5)

## 2017-01-05 LAB — IGG, IGA, IGM
IGA: 80 mg/dL (ref 61–437)
IgG (Immunoglobin G), Serum: 432 mg/dL — ABNORMAL LOW (ref 700–1600)
IgM, Serum: 40 mg/dL (ref 15–143)

## 2017-01-05 LAB — BETA 2 MICROGLOBULIN, SERUM: BETA 2 MICROGLOBULIN: 3.1 mg/L — AB (ref 0.6–2.4)

## 2017-01-05 MED ORDER — SODIUM CHLORIDE 0.9% FLUSH
10.0000 mL | INTRAVENOUS | Status: DC | PRN
  Administered 2017-01-05: 10 mL
  Filled 2017-01-05: qty 10

## 2017-01-05 MED ORDER — SODIUM CHLORIDE 0.9 % IV SOLN
Freq: Once | INTRAVENOUS | Status: AC
Start: 2017-01-05 — End: 2017-01-05
  Administered 2017-01-05: 14:00:00 via INTRAVENOUS

## 2017-01-05 MED ORDER — PEGFILGRASTIM 6 MG/0.6ML ~~LOC~~ PSKT
6.0000 mg | PREFILLED_SYRINGE | Freq: Once | SUBCUTANEOUS | Status: AC
  Administered 2017-01-05: 6 mg via SUBCUTANEOUS
  Filled 2017-01-05: qty 0.6

## 2017-01-05 MED ORDER — HEPARIN SOD (PORK) LOCK FLUSH 100 UNIT/ML IV SOLN
500.0000 [IU] | Freq: Once | INTRAVENOUS | Status: AC | PRN
  Administered 2017-01-05: 500 [IU]
  Filled 2017-01-05: qty 5

## 2017-01-05 MED ORDER — SODIUM CHLORIDE 0.9 % IV SOLN
90.0000 mg/m2 | Freq: Once | INTRAVENOUS | Status: AC
  Administered 2017-01-05: 170 mg via INTRAVENOUS
  Filled 2017-01-05: qty 34

## 2017-01-05 MED ORDER — DEXAMETHASONE SODIUM PHOSPHATE 10 MG/ML IJ SOLN
10.0000 mg | Freq: Once | INTRAMUSCULAR | Status: AC
  Administered 2017-01-05: 10 mg via INTRAVENOUS
  Filled 2017-01-05: qty 1

## 2017-01-05 MED ORDER — SODIUM CHLORIDE 0.9 % IV SOLN
90.0000 mg/m2 | Freq: Once | INTRAVENOUS | Status: DC
  Filled 2017-01-05: qty 34

## 2017-01-05 NOTE — Progress Notes (Signed)
Chemotherapy given today per orders. Patient tolerated it well, no issues. Vitals stable and discharged home ambulatory from clinic. Follow up as scheduled.

## 2017-01-05 NOTE — Patient Instructions (Signed)
[facility] Discharge Instructions for Patients Receiving Chemotherapy   Beginning January 23rd 2017 lab work for the Cancer Center will be done in the  Main lab at [facility] on 1st floor. If you have a lab appointment with the Cancer Center please come in thru the  Main Entrance and check in at the main information desk   Today you received the following chemotherapy agents   To help prevent nausea and vomiting after your treatment, we encourage you to take your nausea medication     If you develop nausea and vomiting, or diarrhea that is not controlled by your medication, call the clinic.  The clinic phone number is [phone]. Office hours are Monday-Friday 8:30am-5:00pm.  BELOW ARE SYMPTOMS THAT SHOULD BE REPORTED IMMEDIATELY:  *FEVER GREATER THAN 101.0 F  *CHILLS WITH OR WITHOUT FEVER  NAUSEA AND VOMITING THAT IS NOT CONTROLLED WITH YOUR NAUSEA MEDICATION  *UNUSUAL SHORTNESS OF BREATH  *UNUSUAL BRUISING OR BLEEDING  TENDERNESS IN MOUTH AND THROAT WITH OR WITHOUT PRESENCE OF ULCERS  *URINARY PROBLEMS  *BOWEL PROBLEMS  UNUSUAL RASH Items with * indicate a potential emergency and should be followed up as soon as possible. If you have an emergency after office hours please contact your primary care physician or go to the nearest emergency department.  Please call the clinic during office hours if you have any questions or concerns.   You may also contact the Patient Navigator at [phone] should you have any questions or need assistance in obtaining follow up care.      Resources For Cancer Patients and their Caregivers ? American Cancer Society: Can assist with transportation, wigs, general needs, runs Look Good Feel Better.        1-888-227-6333 ? Cancer Care: Provides financial assistance, online support groups, medication/co-pay assistance.  1-800-813-HOPE (4673) ? [facility] Assists [place] cancer  patients and their families through emotional , educational and financial support.  336-427-4357 ? [place] DSS Where to apply for food stamps, Medicaid and utility assistance. 336-342-1394 ? RCATS: Transportation to medical appointments. 336-347-2287 ? Social Security Administration: May apply for disability if have a Stage IV cancer. 336-342-7796 1-800-772-1213 ? [place] Aging, Disability and Transit Services: Assists with nutrition, care and transit needs. 336-349-2343         

## 2017-01-07 LAB — IMMUNOFIXATION ELECTROPHORESIS
IGA: 74 mg/dL (ref 61–437)
IGM, SERUM: 41 mg/dL (ref 15–143)
IgG (Immunoglobin G), Serum: 435 mg/dL — ABNORMAL LOW (ref 700–1600)
Total Protein ELP: 5.3 g/dL — ABNORMAL LOW (ref 6.0–8.5)

## 2017-02-01 ENCOUNTER — Encounter (HOSPITAL_COMMUNITY): Payer: Medicare Other | Admitting: Dietician

## 2017-02-01 ENCOUNTER — Encounter (HOSPITAL_BASED_OUTPATIENT_CLINIC_OR_DEPARTMENT_OTHER): Payer: Medicare Other | Admitting: Hematology

## 2017-02-01 ENCOUNTER — Encounter (HOSPITAL_COMMUNITY): Payer: Self-pay | Admitting: Hematology

## 2017-02-01 ENCOUNTER — Encounter (HOSPITAL_COMMUNITY): Payer: Medicare Other | Attending: Hematology & Oncology

## 2017-02-01 ENCOUNTER — Other Ambulatory Visit (HOSPITAL_COMMUNITY): Payer: Self-pay | Admitting: Oncology

## 2017-02-01 VITALS — BP 116/60 | HR 79 | Temp 97.9°F | Resp 16 | Wt 155.0 lb

## 2017-02-01 DIAGNOSIS — D709 Neutropenia, unspecified: Secondary | ICD-10-CM

## 2017-02-01 DIAGNOSIS — Z5111 Encounter for antineoplastic chemotherapy: Secondary | ICD-10-CM | POA: Diagnosis not present

## 2017-02-01 DIAGNOSIS — Z87891 Personal history of nicotine dependence: Secondary | ICD-10-CM | POA: Insufficient documentation

## 2017-02-01 DIAGNOSIS — R5383 Other fatigue: Secondary | ICD-10-CM | POA: Diagnosis not present

## 2017-02-01 DIAGNOSIS — Z5112 Encounter for antineoplastic immunotherapy: Secondary | ICD-10-CM

## 2017-02-01 DIAGNOSIS — D649 Anemia, unspecified: Secondary | ICD-10-CM | POA: Diagnosis not present

## 2017-02-01 DIAGNOSIS — C880 Waldenstrom macroglobulinemia: Secondary | ICD-10-CM

## 2017-02-01 DIAGNOSIS — Z79899 Other long term (current) drug therapy: Secondary | ICD-10-CM | POA: Diagnosis not present

## 2017-02-01 DIAGNOSIS — C8300 Small cell B-cell lymphoma, unspecified site: Secondary | ICD-10-CM

## 2017-02-01 DIAGNOSIS — D472 Monoclonal gammopathy: Secondary | ICD-10-CM

## 2017-02-01 DIAGNOSIS — E790 Hyperuricemia without signs of inflammatory arthritis and tophaceous disease: Secondary | ICD-10-CM

## 2017-02-01 DIAGNOSIS — D696 Thrombocytopenia, unspecified: Secondary | ICD-10-CM | POA: Insufficient documentation

## 2017-02-01 DIAGNOSIS — D61818 Other pancytopenia: Secondary | ICD-10-CM | POA: Insufficient documentation

## 2017-02-01 DIAGNOSIS — Z9889 Other specified postprocedural states: Secondary | ICD-10-CM | POA: Diagnosis not present

## 2017-02-01 LAB — CBC WITH DIFFERENTIAL/PLATELET
BASOS PCT: 0 %
Basophils Absolute: 0 10*3/uL (ref 0.0–0.1)
EOS ABS: 0.2 10*3/uL (ref 0.0–0.7)
EOS PCT: 3 %
HCT: 32.3 % — ABNORMAL LOW (ref 39.0–52.0)
HEMOGLOBIN: 11 g/dL — AB (ref 13.0–17.0)
Lymphocytes Relative: 28 %
Lymphs Abs: 1.3 10*3/uL (ref 0.7–4.0)
MCH: 31.3 pg (ref 26.0–34.0)
MCHC: 34.1 g/dL (ref 30.0–36.0)
MCV: 91.8 fL (ref 78.0–100.0)
MONO ABS: 0.6 10*3/uL (ref 0.1–1.0)
MONOS PCT: 12 %
NEUTROS PCT: 57 %
Neutro Abs: 2.7 10*3/uL (ref 1.7–7.7)
Platelets: 82 10*3/uL — ABNORMAL LOW (ref 150–400)
RBC: 3.52 MIL/uL — ABNORMAL LOW (ref 4.22–5.81)
RDW: 17.3 % — AB (ref 11.5–15.5)
WBC: 4.7 10*3/uL (ref 4.0–10.5)

## 2017-02-01 LAB — COMPREHENSIVE METABOLIC PANEL
ALBUMIN: 3.7 g/dL (ref 3.5–5.0)
ALT: 19 U/L (ref 17–63)
ANION GAP: 8 (ref 5–15)
AST: 25 U/L (ref 15–41)
Alkaline Phosphatase: 80 U/L (ref 38–126)
BUN: 20 mg/dL (ref 6–20)
CHLORIDE: 104 mmol/L (ref 101–111)
CO2: 26 mmol/L (ref 22–32)
Calcium: 9 mg/dL (ref 8.9–10.3)
Creatinine, Ser: 1.22 mg/dL (ref 0.61–1.24)
GFR calc Af Amer: >60 mL/min (ref >60)
GFR calc non Af Amer: 53 mL/min — ABNORMAL LOW (ref >60)
GLUCOSE: 107 mg/dL — AB (ref 65–99)
POTASSIUM: 4 mmol/L (ref 3.5–5.1)
SODIUM: 138 mmol/L (ref 135–145)
Total Bilirubin: 0.4 mg/dL (ref 0.3–1.2)
Total Protein: 5.7 g/dL — ABNORMAL LOW (ref 6.5–8.1)

## 2017-02-01 LAB — MAGNESIUM: Magnesium: 1.8 mg/dL (ref 1.7–2.4)

## 2017-02-01 LAB — LACTATE DEHYDROGENASE: LDH: 130 U/L (ref 98–192)

## 2017-02-01 LAB — PHOSPHORUS: Phosphorus: 3.1 mg/dL (ref 2.5–4.6)

## 2017-02-01 LAB — URIC ACID: URIC ACID, SERUM: 7.7 mg/dL — AB (ref 4.4–7.6)

## 2017-02-01 MED ORDER — DIPHENHYDRAMINE HCL 25 MG PO CAPS
50.0000 mg | ORAL_CAPSULE | Freq: Once | ORAL | Status: AC
  Administered 2017-02-01: 50 mg via ORAL
  Filled 2017-02-01: qty 2

## 2017-02-01 MED ORDER — SODIUM CHLORIDE 0.9 % IV SOLN
Freq: Once | INTRAVENOUS | Status: AC
  Administered 2017-02-01: 10:00:00 via INTRAVENOUS

## 2017-02-01 MED ORDER — HEPARIN SOD (PORK) LOCK FLUSH 100 UNIT/ML IV SOLN
INTRAVENOUS | Status: AC
  Filled 2017-02-01: qty 5

## 2017-02-01 MED ORDER — DEXAMETHASONE SODIUM PHOSPHATE 10 MG/ML IJ SOLN
10.0000 mg | Freq: Once | INTRAMUSCULAR | Status: AC
  Administered 2017-02-01: 10 mg via INTRAVENOUS
  Filled 2017-02-01: qty 1

## 2017-02-01 MED ORDER — ACETAMINOPHEN 325 MG PO TABS
650.0000 mg | ORAL_TABLET | Freq: Once | ORAL | Status: AC
  Administered 2017-02-01: 650 mg via ORAL
  Filled 2017-02-01: qty 2

## 2017-02-01 MED ORDER — PALONOSETRON HCL INJECTION 0.25 MG/5ML
0.2500 mg | Freq: Once | INTRAVENOUS | Status: AC
  Administered 2017-02-01: 0.25 mg via INTRAVENOUS
  Filled 2017-02-01: qty 5

## 2017-02-01 MED ORDER — HEPARIN SOD (PORK) LOCK FLUSH 100 UNIT/ML IV SOLN
500.0000 [IU] | Freq: Once | INTRAVENOUS | Status: AC | PRN
  Administered 2017-02-01: 500 [IU]

## 2017-02-01 MED ORDER — RITUXIMAB CHEMO INJECTION 500 MG/50ML
375.0000 mg/m2 | Freq: Once | INTRAVENOUS | Status: AC
  Administered 2017-02-01: 700 mg via INTRAVENOUS
  Filled 2017-02-01: qty 20

## 2017-02-01 MED ORDER — SODIUM CHLORIDE 0.9 % IV SOLN
90.0000 mg/m2 | Freq: Once | INTRAVENOUS | Status: AC
  Administered 2017-02-01: 170 mg via INTRAVENOUS
  Filled 2017-02-01: qty 14

## 2017-02-01 MED ORDER — SODIUM CHLORIDE 0.9 % IV SOLN
90.0000 mg/m2 | Freq: Once | INTRAVENOUS | Status: DC
  Filled 2017-02-01: qty 34

## 2017-02-01 NOTE — Patient Instructions (Addendum)
[facility] at [facility] Discharge Instructions  RECOMMENDATIONS MADE BY THE CONSULTANT AND ANY TEST RESULTS WILL BE SENT TO YOUR REFERRING PHYSICIAN.  You were seen today by Dr. Irene Limbo PET scan in 4 weeks Follow up and lab work in 5 weeks See Amy up front for appointments   Thank you for choosing [place] at [facility] to provide your oncology and hematology care.  To afford each patient quality time with our provider, please arrive at least 15 minutes before your scheduled appointment time.    If you have a lab appointment with the [facility] please come in thru the  Main Entrance and check in at the main information desk  You need to re-schedule your appointment should you arrive 10 or more minutes late.  We strive to give you quality time with our providers, and arriving late affects you and other patients whose appointments are after yours.  Also, if you no show three or more times for appointments you may be dismissed from the clinic at the providers discretion.     Again, thank you for choosing [facility].  Our hope is that these requests will decrease the amount of time that you wait before being seen by our physicians.       _____________________________________________________________  Should you have questions after your visit to [facility], please contact our office at [phone] between the hours of 8:30 a.m. and 4:30 p.m.  Voicemails left after 4:30 p.m. will not be returned until the following business day.  For prescription refill requests, have your pharmacy contact our office.       Resources For Cancer Patients and their Caregivers ? American Cancer Society: Can assist with transportation, wigs, general needs, runs Look Good Feel Better.        219-796-8826 ? Cancer Care: Provides financial assistance, online support groups, medication/co-pay assistance.  1-800-813-HOPE  (541)446-7681) ? [facility] Assists [place] cancer patients and their families through emotional , educational and financial support.  (714) 166-7173 ? [place] DSS Where to apply for food stamps, Medicaid and utility assistance. (726) 286-6620 ? RCATS: Transportation to medical appointments. 859 375 4199 ? Social Security Administration: May apply for disability if have a Stage IV cancer. 512 174 3720 878-518-0743 ? [employer], Disability and Transit Services: Assists with nutrition, care and transit needs. [place] Support Programs: @10RELATIVEDAYS @ > Cancer Support Group  2nd Tuesday of the month 1pm-2pm, Journey Room  > Creative Journey  3rd Tuesday of the month 1130am-1pm, Journey Room  > Look Good Feel Better  1st Wednesday of the month 10am-12 noon, Journey Room (Call [place] to register [phone])

## 2017-02-01 NOTE — Patient Instructions (Signed)
[facility] Discharge Instructions for Patients Receiving Chemotherapy   Beginning January 23rd 2017 lab work for the [facility] will be done in the  Main lab at [facility] on 1st floor. If you have a lab appointment with the [facility] please come in thru the  Main Entrance and check in at the main information desk   Today you received the following chemotherapy agents rituxan and bendamustine.  To help prevent nausea and vomiting after your treatment, we encourage you to take your nausea medication as instructed.   If you develop nausea and vomiting, or diarrhea that is not controlled by your medication, call the clinic.  The clinic phone number is [phone]. Office hours are Monday-Friday 8:30am-5:00pm.  BELOW ARE SYMPTOMS THAT SHOULD BE REPORTED IMMEDIATELY:  *FEVER GREATER THAN 101.0 F  *CHILLS WITH OR WITHOUT FEVER  NAUSEA AND VOMITING THAT IS NOT CONTROLLED WITH YOUR NAUSEA MEDICATION  *UNUSUAL SHORTNESS OF BREATH  *UNUSUAL BRUISING OR BLEEDING  TENDERNESS IN MOUTH AND THROAT WITH OR WITHOUT PRESENCE OF ULCERS  *URINARY PROBLEMS  *BOWEL PROBLEMS  UNUSUAL RASH Items with * indicate a potential emergency and should be followed up as soon as possible. If you have an emergency after office hours please contact your primary care physician or go to the nearest emergency department.  Please call the clinic during office hours if you have any questions or concerns.   You may also contact the Patient Navigator at [phone] should you have any questions or need assistance in obtaining follow up care.      Resources For Cancer Patients and their Caregivers ? American Cancer Society: Can assist with transportation, wigs, general needs, runs Look Good Feel Better.        747-396-8133 ? Cancer Care: Provides financial assistance, online support groups, medication/co-pay assistance.  1-800-813-HOPE 980-359-7203) ? [facility] Assists [place] cancer patients and their families through emotional , educational and financial support.  218-128-3493 ? [place] DSS Where to apply for food stamps, Medicaid and utility assistance. 740 713 8067 ? RCATS: Transportation to medical appointments. 2258017140 ? Social Security Administration: May apply for disability if have a Stage IV cancer. 470-134-6388 920-541-4513 ? [employer], Disability and Transit Services: Assists with nutrition, care and transit needs. (817)624-7010

## 2017-02-01 NOTE — Progress Notes (Signed)
Tolerated chemo well. Stable and ambulatory on discharge home with wife. 

## 2017-02-01 NOTE — Progress Notes (Signed)
Paul Keith Brief Note  Patient identified to be at risk for malnutrition on the MST secondary to Poor appetite/weight loss  Contacted Pt by visiting during his chemo session  Wt Readings from Last 10 Encounters:  02/01/17 155 lb (70.3 kg)  01/04/17 150 lb (68 kg)  12/24/16 154 lb 11.2 oz (70.2 kg)  12/07/16 155 lb 3.2 oz (70.4 kg)  11/30/16 156 lb 9.6 oz (71 kg)  11/25/16 159 lb 2.8 oz (72.2 kg)  11/23/16 157 lb 9.6 oz (71.5 kg)  10/26/16 156 lb 12.8 oz (71.1 kg)  09/29/16 158 lb 12.8 oz (72 kg)  09/28/16 155 lb 14.4 oz (70.7 kg)   Patient weight has increased by 5 lbs in the last month.   RD spoke and screened patient.   Pt says these last two weeks "are the best Ive had in a long time". He believes he gained back 5 lbs secondary to an increase in appetite. He is eating 3 meals a day.   When asked what changed, he says his last chemo infusion hardly affected him at all. He says the drugs in the infusion changed slightly and he says he didn't react to them as much.   Patient says he is doing well. He has gained back 5 lbs and is tolerating his chemo exceptionally well.   No nutritional needs identified at this time.   Burtis Junes RD, LDN, [place] [facility] Pager: [phone] 02/01/2017 10:52 AM

## 2017-02-01 NOTE — Progress Notes (Signed)
Paul Keith  HEMATOLOGY ONCOLOGY PROGRESS NOTE  Date of service: .02/01/2017  Patient Care Team: Octavio Graves, DO as PCP - General  CC: f/u LPL  Diagnosis: WaldenStrom's macroglobulinemia/malignant lymphoplasmacytic lymphoma  Current Treatment: BR (cycle 6 today)  SUMMARY OF ONCOLOGIC HISTORY:   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[place])   05/06/2016 Procedure    Bone marrow aspiration and biopsy      05/10/2016 Pathology Results    Bone Marrow Flow Cytometry - MONOCLONAL B-CELL POPULATION IDENTIFIED. The pattern is somewhat nonspecific but the findings are consistent with non-Hodgkin B-cell lymphoma.      05/10/2016 Pathology Results    Bone Marrow, Aspirate,Biopsy, and Clot, right ilium - HYPERCELLULAR BONE MARROW WITH EXTENSIVE INVOLVEMENT BY NON-HODGKIN'S B-CELL LYMPHOMA.      05/13/2016 PET scan    Splenomegaly and mild diffuse increase metabolic activity in the spleen consistent with lymphomatous involvement. Diffuse osseous uptake likely due to diffuse lymphoma. Recommend correlation with any marrow stimulating drugs or rebound phenomenon.      05/21/2016 Initial Diagnosis    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[place])      06/01/2016 Imaging    Circumferential diffuse thickening of the mid to distal sigmoid colon. Adjacent small amount of fluid has decreased since prior examination with interval clearing of previously noted small amount of extraluminal air.      06/03/2016 - 07/23/2016 Antibody Plan    Single-agent Rituxan      08/17/2016 -  Chemotherapy    The patient had palonosetron (ALOXI) injection 0.25 mg, 0.25 mg, Intravenous,  Once, 4 of 6 cycles  pegfilgrastim (NEULASTA ONPRO KIT) injection 6 mg, 6 mg, Subcutaneous, Once, 4 of 6 cycles  riTUXimab (RITUXAN) 700 mg in sodium chloride 0.9 % 250 mL (2.1875 mg/mL) chemo infusion, 375 mg/m2 = 700 mg, Intravenous,  Once, 4 of 6 cycles  bendamustine (BENDEKA) 175 mg in sodium chloride 0.9 % 50 mL (3.0702 mg/mL) chemo  infusion, 90 mg/m2 = 175 mg, Intravenous,  Once, 4 of 6 cycles  for chemotherapy treatment.        11/25/2016 - 11/28/2016 Hospital Admission    Admit date: 11/25/2016 Admission diagnosis: Febrile neutropenia Additional comments: Gastroenteritis      12/24/2016 - 12/27/2016 Hospital Admission    Admit date: 12/24/2016 Admission diagnosis: Neutropenic fever Additional comments:        INTERVAL HISTORY:  Patient is here for follow-up to his last planned cycle of bendamustine and Rituxan for treatment of his malignant lymphoplasmacytic lymphoma. He notes that the last couple weeks of been quite good and he is feeling great. Has gained about 5 pounds and has been much more physically active. Fevers no chills no night sweats. Labs are stable with only mild thrombocytopenia. Has previously had febrile neutropenia but is on G-CSF support and ANC today is within normal limits.  REVIEW OF SYSTEMS:    10 Point review of systems of done and is negative except as noted above.  . Past Medical History:  Diagnosis Date  . Anemia 04/22/2016  . Diverticulosis    pt reports recent admission for diverticulosis perforation this past week  . 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[place]) 05/21/2016  . Pancytopenia ([place]) 04/22/2016    . Past Surgical History:  Procedure Laterality Date  . CATARACT EXTRACTION Bilateral   . TONSILLECTOMY      . Social History  Substance Use Topics  . Smoking status: Former Smoker    Quit date: 09/15/1962  . Smokeless tobacco: Never Used  .  Alcohol use Yes     Comment: OCCASSIONAL    ALLERGIES:  has No Known Allergies.  MEDICATIONS:  Current Outpatient Prescriptions  Medication Sig Dispense Refill  . acetaminophen (TYLENOL) 500 MG tablet Take 500 mg by mouth every 6 (six) hours as needed for mild pain.    Paul Keith acyclovir (ZOVIRAX) 400 MG tablet   3  . B Complex-C (B-COMPLEX WITH VITAMIN C) tablet Take 1 tablet by  mouth daily.    . bendamustine in sodium chloride 0.9 % 50 mL Inject into the vein once. Every 28 days    . cholecalciferol (VITAMIN D) 1000 units tablet Take 1,000 Units by mouth daily.    . cyanocobalamin 100 MCG tablet Take by mouth daily. Pt reports unsure of dosage, takes once daily    . loperamide (IMODIUM) 2 MG capsule Take 1 capsule (2 mg total) by mouth every 4 (four) hours as needed for diarrhea or loose stools. 20 capsule 0  . Multiple Vitamin (MULTIVITAMIN WITH MINERALS) TABS tablet Take 1 tablet by mouth daily.    . tamsulosin (FLOMAX) 0.4 MG CAPS capsule Take 0.8 mg by mouth daily after supper.      No current facility-administered medications for this visit.    Facility-Administered Medications Ordered in Other Visits  Medication Dose Route Frequency Provider Last Rate Last Dose  . 0.9 %  sodium chloride infusion   Intravenous Once Brunetta Genera, MD      . acetaminophen (TYLENOL) tablet 650 mg  650 mg Oral Once Brunetta Genera, MD      . dexamethasone (DECADRON) injection 10 mg  10 mg Intravenous Once Brunetta Genera, MD      . diphenhydrAMINE (BENADRYL) capsule 50 mg  50 mg Oral Once Brunetta Genera, MD      . palonosetron (ALOXI) injection 0.25 mg  0.25 mg Intravenous Once Brunetta Genera, MD      . riTUXimab (RITUXAN) 700 mg in sodium chloride 0.9 % 250 mL (2.1875 mg/mL) chemo infusion  375 mg/m2 (Treatment Plan Recorded) Intravenous Once Brunetta Genera, MD        PHYSICAL EXAMINATION: ECOG PERFORMANCE STATUS: 1 - Symptomatic but completely ambulatory VSS in EPIC GENERAL:alert, in no acute distress and comfortable SKIN: no acute rashes, no significant lesions EYES: conjunctiva are pink and non-injected, sclera anicteric OROPHARYNX: MMM, no exudates, no oropharyngeal erythema or ulceration NECK: supple, no JVD LYMPH:  no palpable lymphadenopathy in the cervical, axillary or inguinal regions LUNGS: clear to auscultation b/l with normal  respiratory effort HEART: regular rate & rhythm ABDOMEN:  normoactive bowel sounds , non tender, not distended. Extremity: no pedal edema PSYCH: alert & oriented x 3 with fluent speech NEURO: no focal motor/sensory deficits  LABORATORY DATA:   I have reviewed the data as listed  . CBC Latest Ref Rng & Units 02/01/2017 01/04/2017 12/27/2016  WBC 4.0 - 10.5 K/uL 4.7 3.4(L) 1.5(L)  Hemoglobin 13.0 - 17.0 g/dL 11.0(L) 11.4(L) 9.6(L)  Hematocrit 39.0 - 52.0 % 32.3(L) 33.5(L) 28.0(L)  Platelets 150 - 400 K/uL 82(L) 128(L) 90(L)    . CMP Latest Ref Rng & Units 02/01/2017 01/04/2017 12/27/2016  Glucose 65 - 99 mg/dL 107(H) 103(H) 92  BUN 6 - 20 mg/dL '20 16 14  ' Creatinine 0.61 - 1.24 mg/dL 1.22 1.02 1.27(H)  Sodium 135 - 145 mmol/L 138 135 135  Potassium 3.5 - 5.1 mmol/L 4.0 3.8 4.0  Chloride 101 - 111 mmol/L 104 102 112(H)  CO2 22 - 32  mmol/L 26 27 18(L)  Calcium 8.9 - 10.3 mg/dL 9.0 9.1 8.5(L)  Total Protein 6.5 - 8.1 g/dL 5.7(L) 5.7(L) -  Total Bilirubin 0.3 - 1.2 mg/dL 0.4 0.4 -  Alkaline Phos 38 - 126 U/L 80 58 -  AST 15 - 41 U/L 25 25 -  ALT 17 - 63 U/L 19 26 -   . Lab Results  Component Value Date   LDH 130 02/01/2017          RADIOGRAPHIC STUDIES: I have personally reviewed the radiological images as listed and agreed with the findings in the report. No results found.  ASSESSMENT & PLAN:   1) Malignant lymphoplasmacytic Lymphoma s/p treatment as noted above. Had presented with panyctopenia. LDH has normalized. Has had diverticulitis with perf prior to intiation of treatment and had Rituxan alone prior to addition of bendamustine. Some neutropenia with Bendamustine currently on G-CSF support. He is here for his final planned cycle of BR. WBC/ANC WNL Mild thrombocytopenia 82k. Has tolerated treatment with platelets in this range previously. ?immune element to thrombocytopenia in addition to chemotherapy PLAN -considered dose reduction of bendamustine to 43m/m2 vs  continuing same dose of 944mm2 with G-CSF. We shall continue with his current dose of bendamustine with G-CSF support. -No issues with tolerating Rituxan. -Patient feels well and has gained 5 pounds and he is eating well with good functional status. -Repeat CT scan in 4 weeks for new posttreatment baseline to determine further treatment . We'll repeat myeloma panel, kappa Lambda free light chains , LDH, CBC with differential, CMP on follow-up .  return to clinic in 5 weeks with labs and PET/CT scan   -Discuss role of Rituxan maintenance versus active surveillance with follow-up. -Port-A-Cath management as per this decision  -Neutropenic precautions recommended .    I spent 25 minutes counseling the patient face to face. The total time spent in the appointment was 30 minutes and more than 50% was on counseling and direct patient cares.    GaSullivan LoneD MSStar CityAHIVMS SCCenter For Eye Surgery LLCTGreene County Medical Centerematology/Oncology Physician CoDrug Rehabilitation Incorporated [facility](Office):       [phone] cell):  [phone]):           [phone]

## 2017-02-02 ENCOUNTER — Encounter (HOSPITAL_BASED_OUTPATIENT_CLINIC_OR_DEPARTMENT_OTHER): Payer: Medicare Other

## 2017-02-02 VITALS — BP 118/49 | HR 66 | Temp 97.4°F | Resp 16

## 2017-02-02 DIAGNOSIS — D472 Monoclonal gammopathy: Secondary | ICD-10-CM

## 2017-02-02 DIAGNOSIS — Z5111 Encounter for antineoplastic chemotherapy: Secondary | ICD-10-CM

## 2017-02-02 DIAGNOSIS — C8300 Small cell B-cell lymphoma, unspecified site: Secondary | ICD-10-CM

## 2017-02-02 DIAGNOSIS — C880 Waldenstrom macroglobulinemia: Secondary | ICD-10-CM

## 2017-02-02 MED ORDER — SODIUM CHLORIDE 0.9 % IV SOLN
90.0000 mg/m2 | Freq: Once | INTRAVENOUS | Status: AC
  Administered 2017-02-02: 170 mg via INTRAVENOUS
  Filled 2017-02-02: qty 14

## 2017-02-02 MED ORDER — PEGFILGRASTIM 6 MG/0.6ML ~~LOC~~ PSKT
6.0000 mg | PREFILLED_SYRINGE | Freq: Once | SUBCUTANEOUS | Status: AC
  Administered 2017-02-02: 6 mg via SUBCUTANEOUS
  Filled 2017-02-02: qty 0.6

## 2017-02-02 MED ORDER — DEXAMETHASONE SODIUM PHOSPHATE 10 MG/ML IJ SOLN
INTRAMUSCULAR | Status: AC
  Filled 2017-02-02: qty 1

## 2017-02-02 MED ORDER — SODIUM CHLORIDE 0.9 % IV SOLN
Freq: Once | INTRAVENOUS | Status: AC
  Administered 2017-02-02: 09:00:00 via INTRAVENOUS

## 2017-02-02 MED ORDER — DEXAMETHASONE SODIUM PHOSPHATE 10 MG/ML IJ SOLN
10.0000 mg | Freq: Once | INTRAMUSCULAR | Status: AC
  Administered 2017-02-02: 10 mg via INTRAVENOUS

## 2017-02-02 MED ORDER — HEPARIN SOD (PORK) LOCK FLUSH 100 UNIT/ML IV SOLN
500.0000 [IU] | Freq: Once | INTRAVENOUS | Status: AC | PRN
  Administered 2017-02-02: 500 [IU]
  Filled 2017-02-02 (×2): qty 5

## 2017-02-02 MED ORDER — SODIUM CHLORIDE 0.9% FLUSH
10.0000 mL | INTRAVENOUS | Status: DC | PRN
  Administered 2017-02-02: 10 mL
  Filled 2017-02-02: qty 10

## 2017-02-02 NOTE — Patient Instructions (Signed)
[facility] Discharge Instructions for Patients Receiving Chemotherapy   Beginning January 23rd 2017 lab work for the Cancer Center will be done in the  Main lab at [facility] on 1st floor. If you have a lab appointment with the Cancer Center please come in thru the  Main Entrance and check in at the main information desk   Today you received the following chemotherapy agents   To help prevent nausea and vomiting after your treatment, we encourage you to take your nausea medication     If you develop nausea and vomiting, or diarrhea that is not controlled by your medication, call the clinic.  The clinic phone number is [phone]. Office hours are Monday-Friday 8:30am-5:00pm.  BELOW ARE SYMPTOMS THAT SHOULD BE REPORTED IMMEDIATELY:  *FEVER GREATER THAN 101.0 F  *CHILLS WITH OR WITHOUT FEVER  NAUSEA AND VOMITING THAT IS NOT CONTROLLED WITH YOUR NAUSEA MEDICATION  *UNUSUAL SHORTNESS OF BREATH  *UNUSUAL BRUISING OR BLEEDING  TENDERNESS IN MOUTH AND THROAT WITH OR WITHOUT PRESENCE OF ULCERS  *URINARY PROBLEMS  *BOWEL PROBLEMS  UNUSUAL RASH Items with * indicate a potential emergency and should be followed up as soon as possible. If you have an emergency after office hours please contact your primary care physician or go to the nearest emergency department.  Please call the clinic during office hours if you have any questions or concerns.   You may also contact the Patient Navigator at [phone] should you have any questions or need assistance in obtaining follow up care.      Resources For Cancer Patients and their Caregivers ? American Cancer Society: Can assist with transportation, wigs, general needs, runs Look Good Feel Better.        1-888-227-6333 ? Cancer Care: Provides financial assistance, online support groups, medication/co-pay assistance.  1-800-813-HOPE (4673) ? [facility] Assists [place] cancer  patients and their families through emotional , educational and financial support.  336-427-4357 ? [place] DSS Where to apply for food stamps, Medicaid and utility assistance. 336-342-1394 ? [employer]: Transportation to medical appointments. 336-347-2287 ? Social Security Administration: May apply for disability if have a Stage IV cancer. 336-342-7796 1-800-772-1213 ? [place] Aging, Disability and Transit Services: Assists with nutrition, care and transit needs. 336-349-2343         

## 2017-02-02 NOTE — Progress Notes (Signed)
Chemotherapy given today as ordered. Patient tolerated it well, no issues. Vitals stable and discharged home from clinic ambulatory. Follow up as scheduled.

## 2017-02-09 ENCOUNTER — Encounter (HOSPITAL_BASED_OUTPATIENT_CLINIC_OR_DEPARTMENT_OTHER): Payer: Medicare Other

## 2017-02-09 VITALS — BP 118/59 | HR 96 | Temp 98.0°F | Resp 18

## 2017-02-09 DIAGNOSIS — Z95828 Presence of other vascular implants and grafts: Secondary | ICD-10-CM

## 2017-02-09 DIAGNOSIS — E790 Hyperuricemia without signs of inflammatory arthritis and tophaceous disease: Secondary | ICD-10-CM | POA: Diagnosis not present

## 2017-02-09 MED ORDER — HEPARIN SOD (PORK) LOCK FLUSH 100 UNIT/ML IV SOLN
500.0000 [IU] | Freq: Once | INTRAVENOUS | Status: AC
  Administered 2017-02-09: 500 [IU] via INTRAVENOUS
  Filled 2017-02-09: qty 5

## 2017-02-09 MED ORDER — SODIUM CHLORIDE 0.9 % IV SOLN
6.0000 mg | Freq: Once | INTRAVENOUS | Status: AC
  Administered 2017-02-09: 6 mg via INTRAVENOUS
  Filled 2017-02-09: qty 4

## 2017-02-09 MED ORDER — SODIUM CHLORIDE 0.9% FLUSH
10.0000 mL | Freq: Once | INTRAVENOUS | Status: AC
  Administered 2017-02-09: 10 mL via INTRAVENOUS

## 2017-02-09 NOTE — Progress Notes (Signed)
Tolerated infusion well. Stable and ambulatory on discharge home with wife.

## 2017-02-09 NOTE — Patient Instructions (Signed)
Center at [facility] Discharge Instructions  RECOMMENDATIONS MADE BY THE CONSULTANT AND ANY TEST RESULTS WILL BE SENT TO YOUR REFERRING PHYSICIAN.  Rasburicase infusion given as ordered. Return as scheduled.  Thank you for choosing [place] at [facility] to provide your oncology and hematology care.  To afford each patient quality time with our provider, please arrive at least 15 minutes before your scheduled appointment time.    If you have a lab appointment with the [facility] please come in thru the  Main Entrance and check in at the main information desk  You need to re-schedule your appointment should you arrive 10 or more minutes late.  We strive to give you quality time with our providers, and arriving late affects you and other patients whose appointments are after yours.  Also, if you no show three or more times for appointments you may be dismissed from the clinic at the providers discretion.     Again, thank you for choosing [facility].  Our hope is that these requests will decrease the amount of time that you wait before being seen by our physicians.       _____________________________________________________________  Should you have questions after your visit to [facility], please contact our office at [phone] between the hours of 8:30 a.m. and 4:30 p.m.  Voicemails left after 4:30 p.m. will not be returned until the following business day.  For prescription refill requests, have your pharmacy contact our office.       Resources For Cancer Patients and their Caregivers ? American Cancer Society: Can assist with transportation, wigs, general needs, runs Look Good Feel Better.        310 427 0972 ? Cancer Care: Provides financial assistance, online support groups, medication/co-pay assistance.  1-800-813-HOPE (920)552-2856) ? [facility] Assists [place] cancer  patients and their families through emotional , educational and financial support.  607 414 6508 ? [place] DSS Where to apply for food stamps, Medicaid and utility assistance. (782)577-9926 ? [facility]: Transportation to medical appointments. 442 418 8080 ? Social Security Administration: May apply for disability if have a Stage IV cancer. 910 461 0022 (217) 087-6639 ? [employer], Disability and Transit Services: Assists with nutrition, care and transit needs. [place] Support Programs: @10RELATIVEDAYS @ > Cancer Support Group  2nd Tuesday of the month 1pm-2pm, Journey Room  > Creative Journey  3rd Tuesday of the month 1130am-1pm, Journey Room  > Look Good Feel Better  1st Wednesday of the month 10am-12 noon, Journey Room (Call [place] to register [phone])

## 2017-02-15 ENCOUNTER — Other Ambulatory Visit (HOSPITAL_COMMUNITY): Payer: Self-pay | Admitting: Emergency Medicine

## 2017-02-15 DIAGNOSIS — C880 Waldenstrom macroglobulinemia: Secondary | ICD-10-CM

## 2017-02-15 NOTE — Progress Notes (Signed)
Paul Keith called and stated that he is having some diaarhea, imodium is helping some.  His mouth is a little sore.  He is still feeling weak and dizzy.  He is going to come in tomorrow for blood work at 3:00 pm.

## 2017-02-16 ENCOUNTER — Other Ambulatory Visit (HOSPITAL_COMMUNITY): Payer: Medicare Other

## 2017-02-17 ENCOUNTER — Encounter (HOSPITAL_COMMUNITY): Payer: Medicare Other | Attending: Oncology

## 2017-02-17 DIAGNOSIS — C880 Waldenstrom macroglobulinemia: Secondary | ICD-10-CM | POA: Insufficient documentation

## 2017-02-17 LAB — CBC WITH DIFFERENTIAL/PLATELET
Basophils Absolute: 0 10*3/uL (ref 0.0–0.1)
Basophils Relative: 1 %
EOS ABS: 0 10*3/uL (ref 0.0–0.7)
EOS PCT: 2 %
HCT: 29.2 % — ABNORMAL LOW (ref 39.0–52.0)
Hemoglobin: 10 g/dL — ABNORMAL LOW (ref 13.0–17.0)
LYMPHS ABS: 0.9 10*3/uL (ref 0.7–4.0)
LYMPHS PCT: 46 %
MCH: 31.4 pg (ref 26.0–34.0)
MCHC: 34.2 g/dL (ref 30.0–36.0)
MCV: 91.8 fL (ref 78.0–100.0)
MONO ABS: 0.5 10*3/uL (ref 0.1–1.0)
Monocytes Relative: 25 %
Neutro Abs: 0.5 10*3/uL — ABNORMAL LOW (ref 1.7–7.7)
Neutrophils Relative %: 26 %
PLATELETS: 118 10*3/uL — AB (ref 150–400)
RBC: 3.18 MIL/uL — ABNORMAL LOW (ref 4.22–5.81)
RDW: 16.3 % — AB (ref 11.5–15.5)
WBC: 1.9 10*3/uL — AB (ref 4.0–10.5)

## 2017-02-17 LAB — COMPREHENSIVE METABOLIC PANEL
ALT: 21 U/L (ref 17–63)
ANION GAP: 6 (ref 5–15)
AST: 20 U/L (ref 15–41)
Albumin: 3.2 g/dL — ABNORMAL LOW (ref 3.5–5.0)
Alkaline Phosphatase: 81 U/L (ref 38–126)
BUN: 16 mg/dL (ref 6–20)
CHLORIDE: 104 mmol/L (ref 101–111)
CO2: 26 mmol/L (ref 22–32)
Calcium: 8.7 mg/dL — ABNORMAL LOW (ref 8.9–10.3)
Creatinine, Ser: 1.34 mg/dL — ABNORMAL HIGH (ref 0.61–1.24)
GFR, EST AFRICAN AMERICAN: 54 mL/min — AB (ref >60)
GFR, EST NON AFRICAN AMERICAN: 47 mL/min — AB (ref >60)
Glucose, Bld: 109 mg/dL — ABNORMAL HIGH (ref 65–99)
POTASSIUM: 4 mmol/L (ref 3.5–5.1)
SODIUM: 136 mmol/L (ref 135–145)
Total Bilirubin: 0.2 mg/dL — ABNORMAL LOW (ref 0.3–1.2)
Total Protein: 5.6 g/dL — ABNORMAL LOW (ref 6.5–8.1)

## 2017-02-18 ENCOUNTER — Telehealth (HOSPITAL_COMMUNITY): Payer: Self-pay

## 2017-02-18 ENCOUNTER — Other Ambulatory Visit (HOSPITAL_COMMUNITY): Payer: Self-pay

## 2017-02-18 DIAGNOSIS — C8300 Small cell B-cell lymphoma, unspecified site: Secondary | ICD-10-CM

## 2017-02-18 DIAGNOSIS — R5081 Fever presenting with conditions classified elsewhere: Principal | ICD-10-CM

## 2017-02-18 DIAGNOSIS — D709 Neutropenia, unspecified: Secondary | ICD-10-CM

## 2017-02-18 DIAGNOSIS — K1379 Other lesions of oral mucosa: Secondary | ICD-10-CM

## 2017-02-18 MED ORDER — MAGIC MOUTHWASH W/LIDOCAINE: ORAL | 1 refills | Status: DC

## 2017-02-18 NOTE — Telephone Encounter (Signed)
Mucositis could be related to Bendamustine.  Please call in Paul Keith with lidocaine for patient.  If this is ineffective, they should call us and let us know.   Mike Craze, NP [place] [phone]

## 2017-02-18 NOTE — Telephone Encounter (Signed)
Patients wife called to review the neutropenic precaution information I had left on voicemail. She also said patient has sores in his mouth. No white patches or coating. He does not have any magic or Dukes mouthwash. Explained to wife I would talk with provider and call her back.

## 2017-02-18 NOTE — Telephone Encounter (Signed)
Magic mouthwash with lidocaine prescription faxed to patients pharmacy. Notified patients wife who verbalized understanding.

## 2017-03-01 ENCOUNTER — Ambulatory Visit (HOSPITAL_COMMUNITY)
Admission: RE | Admit: 2017-03-01 | Discharge: 2017-03-01 | Disposition: A | Discharge status: Home / Self Care | Payer: Medicare Other | Source: Ambulatory Visit | Attending: Hematology | Admitting: Hematology

## 2017-03-01 DIAGNOSIS — N400 Enlarged prostate without lower urinary tract symptoms: Secondary | ICD-10-CM | POA: Diagnosis not present

## 2017-03-01 DIAGNOSIS — K5641 Fecal impaction: Secondary | ICD-10-CM | POA: Diagnosis not present

## 2017-03-01 DIAGNOSIS — C8300 Small cell B-cell lymphoma, unspecified site: Secondary | ICD-10-CM | POA: Insufficient documentation

## 2017-03-01 DIAGNOSIS — I700 Atherosclerosis of aorta: Secondary | ICD-10-CM | POA: Diagnosis not present

## 2017-03-01 DIAGNOSIS — R161 Splenomegaly, not elsewhere classified: Secondary | ICD-10-CM | POA: Insufficient documentation

## 2017-03-01 DIAGNOSIS — C880 Waldenstrom macroglobulinemia: Secondary | ICD-10-CM | POA: Diagnosis present

## 2017-03-01 DIAGNOSIS — I708 Atherosclerosis of other arteries: Secondary | ICD-10-CM | POA: Diagnosis not present

## 2017-03-01 LAB — GLUCOSE, CAPILLARY: Glucose-Capillary: 95 mg/dL (ref 65–99)

## 2017-03-01 MED ORDER — FLUDEOXYGLUCOSE F - 18 (FDG) INJECTION
7.3900 | Freq: Once | INTRAVENOUS | Status: AC
  Administered 2017-03-01: 7.39 via INTRAVENOUS

## 2017-03-07 NOTE — Progress Notes (Addendum)
[place], [place] [address]   CLINIC:  Medical Oncology/Hematology  PCP:  Paul Graves, DO [address] [place] [address] [phone]   REASON FOR VISIT:  Follow-up for Waldenstrom's macroglobulinemia/malignant lymphoplasmacytic lymphoma   CURRENT THERAPY: s/p Bendamustine/Rituxan x 6 cycles   BRIEF ONCOLOGIC HISTORY:    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[facility])   05/06/2016 Procedure    Bone marrow aspiration and biopsy      05/10/2016 Pathology Results    Bone Marrow Flow Cytometry - MONOCLONAL B-CELL POPULATION IDENTIFIED. The pattern is somewhat nonspecific but the findings are consistent with non-Hodgkin B-cell lymphoma.      05/10/2016 Pathology Results    Bone Marrow, Aspirate,Biopsy, and Clot, right ilium - HYPERCELLULAR BONE MARROW WITH EXTENSIVE INVOLVEMENT BY NON-HODGKIN'S B-CELL LYMPHOMA.      05/13/2016 PET scan    Splenomegaly and mild diffuse increase metabolic activity in the spleen consistent with lymphomatous involvement. Diffuse osseous uptake likely due to diffuse lymphoma. Recommend correlation with any marrow stimulating drugs or rebound phenomenon.      05/21/2016 Initial Diagnosis    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[place])      06/01/2016 Imaging    Circumferential diffuse thickening of the mid to distal sigmoid colon. Adjacent small amount of fluid has decreased since prior examination with interval clearing of previously noted small amount of extraluminal air.      06/03/2016 - 07/23/2016 Antibody Plan    Single-agent Rituxan      08/17/2016 -  Chemotherapy    The patient had palonosetron (ALOXI) injection 0.25 mg, 0.25 mg, Intravenous,  Once, 4 of 6 cycles  pegfilgrastim (NEULASTA ONPRO KIT) injection 6 mg, 6 mg, Subcutaneous, Once, 4 of 6 cycles  riTUXimab (RITUXAN) 700 mg in sodium chloride 0.9 % 250 mL (2.1875 mg/mL) chemo infusion, 375 mg/m2 = 700 mg, Intravenous,  Once, 4 of 6  cycles  bendamustine (BENDEKA) 175 mg in sodium chloride 0.9 % 50 mL (3.0702 mg/mL) chemo infusion, 90 mg/m2 = 175 mg, Intravenous,  Once, 4 of 6 cycles  for chemotherapy treatment.        11/25/2016 - 11/28/2016 Hospital Admission    Admit date: 11/25/2016 Admission diagnosis: Febrile neutropenia Additional comments: Gastroenteritis      12/24/2016 - 12/27/2016 Hospital Admission    Admit date: 12/24/2016 Admission diagnosis: Neutropenic fever Additional comments:       03/01/2017 PET scan    IMPRESSION: 1. Significant improvement in splenomegaly compared to the prior exam, previously 1400 cc, currently 580 cc. 2. Resolution of prior diffuse marrow metabolic activity. 3. High activity focus along the anorectal junction, left anterior 1 o'clock position, not present previously. This could be physiologic activity or new tumor, correlate with digital rectal exam. 4. Nonspecific free pelvic fluid in the pelvis with a small mesenteric acute ill-defined collection of fluid in the upper abdomen. The mesenteric fluid is slightly increased in volume compared to the prior chest CT. Cause for these fluid collections is uncertain. 5. No hypermetabolic adenopathy. 6. Coronary, aortic arch, and branch vessel atherosclerotic vascular disease. Aortoiliac atherosclerotic vascular disease. 7. Enlarged prostate gland. 8.  Prominent stool throughout the colon favors constipation.        INTERVAL HISTORY:  [name] 83 y.o. male returns for follow-up of Waldenstrom's macroglobulinemia/malignant lymphoplasmacytic lymphoma.   He is here today with his wife.  Overall, he reports feeling pretty well. He is continuing to recover from his chemotherapy. Appetite is improving and is  at about 75% of baseline.  Energy levels are slowly improving as well, currently at 50%.  Denies any recent changes in his bowel habits, rectal pain, abdominal pain, or blood in his stools. States that he has periodic stool  incontinence "where I think I just have to pass gas but stool comes out as well."  This started with his chemotherapy and is slowly improving. Bowels moving generally 1-2 times/day.  He does have urinary frequency and nocturia. Denies any dysuria or hematuria.  He feels dizzy when standing. No falls.   He and his wife are looking forward to an upcoming cruise in [place] in mid-May and will return in early June. Then, they have a 1-week beach trip scheduled that they travel for annually.  They really enjoy traveling together as often as they are able.   He recently underwent restaging PET scan and is here to review those results.     REVIEW OF SYSTEMS:  Review of Systems  Constitutional: Positive for fatigue (improving ). Negative for chills and fever.  HENT:  Negative.  Negative for lump/mass.   Eyes: Negative.   Respiratory: Negative.  Negative for cough and shortness of breath.   Cardiovascular: Negative.  Negative for chest pain and leg swelling.  Gastrointestinal: Negative for abdominal pain, blood in stool, constipation, diarrhea, nausea, rectal pain and vomiting.       Periodic stool incontinence   Endocrine: Negative.   Genitourinary: Positive for frequency and nocturia. Negative for dysuria and hematuria.   Musculoskeletal: Negative.  Negative for arthralgias.  Skin: Negative.  Negative for rash.  Neurological: Positive for dizziness. Negative for headaches.  Hematological: Negative.  Negative for adenopathy. Does not bruise/bleed easily.  Psychiatric/Behavioral: Negative.  Negative for depression and sleep disturbance. The patient is not nervous/anxious.      PAST MEDICAL/SURGICAL HISTORY:  Past Medical History:  Diagnosis Date  . Anemia 04/22/2016  . Diverticulosis    pt reports recent admission for diverticulosis perforation this past week  . 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[facility]) 05/21/2016  . Pancytopenia ([place])  04/22/2016   Past Surgical History:  Procedure Laterality Date  . CATARACT EXTRACTION Bilateral   . TONSILLECTOMY       SOCIAL HISTORY:  Social History   Social History  . Marital status: Married    Spouse name: N/A  . Number of children: N/A  . Years of education: N/A   Occupational History  . Not on file.   Social History Main Topics  . Smoking status: Former Smoker    Quit date: 09/15/1962  . Smokeless tobacco: Never Used  . Alcohol use Yes     Comment: OCCASSIONAL  . Drug use: No  . Sexual activity: Not on file     Comment: married   Other Topics Concern  . Not on file   Social History Narrative  . No narrative on file    FAMILY HISTORY:  History reviewed. No pertinent family history.  CURRENT MEDICATIONS:  Outpatient Encounter Prescriptions as of 03/08/2017  Medication Sig  . acetaminophen (TYLENOL) 500 MG tablet Take 500 mg by mouth every 6 (six) hours as needed for mild pain.  . B Complex-C (B-COMPLEX WITH VITAMIN C) tablet Take 1 tablet by mouth daily.  . cholecalciferol (VITAMIN D) 1000 units tablet Take 1,000 Units by mouth daily.  . cyanocobalamin 100 MCG tablet Take by mouth daily. Pt reports unsure of dosage, takes once daily  . Multiple Vitamin (MULTIVITAMIN WITH MINERALS)  TABS tablet Take 1 tablet by mouth daily.  . tamsulosin (FLOMAX) 0.4 MG CAPS capsule Take 0.8 mg by mouth daily after supper.   . [DISCONTINUED] acyclovir (ZOVIRAX) 400 MG tablet   . [DISCONTINUED] bendamustine in sodium chloride 0.9 % 50 mL Inject into the vein once. Every 28 days  . [DISCONTINUED] loperamide (IMODIUM) 2 MG capsule Take 1 capsule (2 mg total) by mouth every 4 (four) hours as needed for diarrhea or loose stools.  . [DISCONTINUED] magic mouthwash w/lidocaine SOLN 1 part Benadryl 12.58m/5ml, 1 part viscous lidocaine 2%, and 1 part maalox. Swish and swallow 5 mL four times a day.   No facility-administered encounter medications on file as of 03/08/2017.     ALLERGIES:   No Known Allergies   PHYSICAL EXAM:  ECOG Performance status: 1 - Symptomatic, but independent.   Vitals:   03/08/17 0918 03/08/17 0919  BP: 131/73 (!) 106/59  Pulse: 89   Resp: 16   Temp: 97.6 F (36.4 C)    *Orthostatic VS as above.   Filed Weights   03/08/17 0918  Weight: 156 lb 8 oz (71 kg)    Physical Exam  Constitutional: He is oriented to person, place, and time and well-developed, well-nourished, and in no distress.  HENT:  Head: Normocephalic.  Mouth/Throat: Oropharynx is clear and moist. No oropharyngeal exudate.  Eyes: Conjunctivae are normal. Pupils are equal, round, and reactive to light. No scleral icterus.  Neck: Normal range of motion. Neck supple.  Cardiovascular: Normal rate, regular rhythm and normal heart sounds.   Pulmonary/Chest: Effort normal and breath sounds normal. No respiratory distress.  Abdominal: Soft. Bowel sounds are normal. There is no tenderness. There is no rebound and no guarding.  Genitourinary: Rectum normal.  Genitourinary Comments: DRE: No palpable masses. Skin tag vs external hemorrhoid at 5:00 position, appears benign. Enlarged prostate without nodularity.   Musculoskeletal: Normal range of motion. He exhibits no edema.  Lymphadenopathy:    He has no cervical adenopathy.    He has no axillary adenopathy.       Right: No supraclavicular adenopathy present.       Left: No supraclavicular adenopathy present.  Neurological: He is alert and oriented to person, place, and time. No cranial nerve deficit. Gait normal.  Skin: Skin is warm and dry. No rash noted.  Psychiatric: Mood, memory, affect and judgment normal.  Nursing note and vitals reviewed.    LABORATORY DATA:  I have reviewed the labs as listed.  CBC    Component Value Date/Time   WBC 4.5 03/08/2017 0801   RBC 3.61 (L) 03/08/2017 0801   HGB 10.9 (L) 03/08/2017 0801   HCT 34.2 (L) 03/08/2017 0801   PLT 111 (L) 03/08/2017 0801   MCV 94.7 03/08/2017 0801   MCH 30.2  03/08/2017 0801   MCHC 31.9 03/08/2017 0801   RDW 16.1 (H) 03/08/2017 0801   LYMPHSABS 1.0 03/08/2017 0801   MONOABS 0.6 03/08/2017 0801   EOSABS 0.2 03/08/2017 0801   BASOSABS 0.0 03/08/2017 0801   CMP Latest Ref Rng & Units 03/08/2017 02/17/2017 02/01/2017  Glucose 65 - 99 mg/dL 74 109(H) 107(H)  BUN 6 - 20 mg/dL '15 16 20  ' Creatinine 0.61 - 1.24 mg/dL 1.15 1.34(H) 1.22  Sodium 135 - 145 mmol/L 135 136 138  Potassium 3.5 - 5.1 mmol/L 3.7 4.0 4.0  Chloride 101 - 111 mmol/L 101 104 104  CO2 22 - 32 mmol/L '26 26 26  ' Calcium 8.9 - 10.3 mg/dL 9.1 8.7(L)  9.0  Total Protein 6.5 - 8.1 g/dL 6.3(L) 5.6(L) 5.7(L)  Total Bilirubin 0.3 - 1.2 mg/dL 0.4 0.2(L) 0.4  Alkaline Phos 38 - 126 U/L 78 81 80  AST 15 - 41 U/L '26 20 25  ' ALT 17 - 63 U/L '19 21 19    ' PENDING LABS:    DIAGNOSTIC IMAGING:  Restaging PET scan: 03/01/17       PATHOLOGY:  Bone marrow biopsy: 05/06/16      ASSESSMENT & PLAN:   Waldenstrom's macroglobulinemia/malignant lymphoplasmacytic lymphoma:  -Diagnosed in 04/2016; s/p chemotherapy with Bendamustine/Rituxan x 6 cycles. Restaging PET scan done on 03/01/17 showed improvement in splenomegaly and resolution of bone marrow hypermetabolic activity. There is evidence of new hypermetabolic activity in anorectal region.  -Discussed maintenance Rituxan vs active surveillance.  Clinically, IgM responded very well to treatment with initial values 2,866 in 05/2016, with IgM 41 in 12/2016.  Serum immunoglobulins pending for today.  -They are planning on going on a cruise in mid-May to [place], followed by a 1-week beach trip. I will have him come back to the cancer center to meet with Dr. Talbert Cage in late 04/2017 to further discuss the pros/cons of maintenance Rituxan vs active surveillance.   New, hypermetabolic activity at anorectal region on PET scan:  -Digital rectal exam performed today and negative without palpable mass. Enlarged prostate evident without nodularity as well. We will  continue to monitor given hypermetabolic activity on PET scan. Discussed possibility of sending him for colonoscopy/sigmoidoscopy, but he prefers to continue to observe clinically for now which is reasonable.  -Discussed with Dr. Talbert Cage; will add on CEA to next labs.   Orthostatic hypotension: -Endorses dizziness with standing. Orthostatic BPs revealed decrease in SBP from 130s-100s.  -Will give 500 mL IV today over 1 hour.  -Encouraged him to force non-caffeinated fluid intake, as tolerated.      Dispo:  -Return to cancer center in late 04/2017 to meet with Dr. Talbert Cage re: maintenance therapy.  -Port flush due in late 04/2017 as well.    All questions were answered to patient's stated satisfaction. Encouraged patient to call with any new concerns or questions before his next visit to the cancer center and we can certain see him sooner, if needed.    Plan of care discussed with Dr. Talbert Cage, who agrees with the above aforementioned.     Orders placed this encounter:  Orders Placed This Encounter  Procedures  . CBC with Differential/Platelet  . Comprehensive metabolic panel  . IgG, IgA, IgM  . Kappa/lambda light chains  . Protein electrophoresis, serum  . Lactate dehydrogenase  . Immunofixation electrophoresis  . Coleman, NP [facility] [phone]

## 2017-03-08 ENCOUNTER — Ambulatory Visit (HOSPITAL_COMMUNITY): Payer: Medicare Other | Admitting: Oncology

## 2017-03-08 ENCOUNTER — Encounter (HOSPITAL_COMMUNITY): Payer: Self-pay | Admitting: Adult Health

## 2017-03-08 ENCOUNTER — Encounter (HOSPITAL_COMMUNITY): Payer: Medicare Other

## 2017-03-08 ENCOUNTER — Encounter (HOSPITAL_BASED_OUTPATIENT_CLINIC_OR_DEPARTMENT_OTHER): Payer: Medicare Other | Admitting: Adult Health

## 2017-03-08 ENCOUNTER — Encounter (HOSPITAL_BASED_OUTPATIENT_CLINIC_OR_DEPARTMENT_OTHER): Payer: Medicare Other

## 2017-03-08 VITALS — BP 106/59 | HR 89 | Temp 97.6°F | Resp 16 | Ht 72.0 in | Wt 156.5 lb

## 2017-03-08 VITALS — BP 103/50 | HR 88 | Resp 16

## 2017-03-08 DIAGNOSIS — C8300 Small cell B-cell lymphoma, unspecified site: Secondary | ICD-10-CM

## 2017-03-08 DIAGNOSIS — R350 Frequency of micturition: Secondary | ICD-10-CM

## 2017-03-08 DIAGNOSIS — R948 Abnormal results of function studies of other organs and systems: Secondary | ICD-10-CM

## 2017-03-08 DIAGNOSIS — Z95828 Presence of other vascular implants and grafts: Secondary | ICD-10-CM

## 2017-03-08 DIAGNOSIS — I959 Hypotension, unspecified: Secondary | ICD-10-CM | POA: Diagnosis not present

## 2017-03-08 DIAGNOSIS — N400 Enlarged prostate without lower urinary tract symptoms: Secondary | ICD-10-CM

## 2017-03-08 DIAGNOSIS — C880 Waldenstrom macroglobulinemia: Secondary | ICD-10-CM | POA: Diagnosis not present

## 2017-03-08 DIAGNOSIS — I951 Orthostatic hypotension: Secondary | ICD-10-CM

## 2017-03-08 DIAGNOSIS — R351 Nocturia: Secondary | ICD-10-CM

## 2017-03-08 LAB — COMPREHENSIVE METABOLIC PANEL
ALBUMIN: 3.8 g/dL (ref 3.5–5.0)
ALT: 19 U/L (ref 17–63)
AST: 26 U/L (ref 15–41)
Alkaline Phosphatase: 78 U/L (ref 38–126)
Anion gap: 8 (ref 5–15)
BUN: 15 mg/dL (ref 6–20)
CHLORIDE: 101 mmol/L (ref 101–111)
CO2: 26 mmol/L (ref 22–32)
CREATININE: 1.15 mg/dL (ref 0.61–1.24)
Calcium: 9.1 mg/dL (ref 8.9–10.3)
GFR calc non Af Amer: 57 mL/min — ABNORMAL LOW (ref >60)
GLUCOSE: 74 mg/dL (ref 65–99)
Potassium: 3.7 mmol/L (ref 3.5–5.1)
SODIUM: 135 mmol/L (ref 135–145)
Total Bilirubin: 0.4 mg/dL (ref 0.3–1.2)
Total Protein: 6.3 g/dL — ABNORMAL LOW (ref 6.5–8.1)

## 2017-03-08 LAB — CBC WITH DIFFERENTIAL/PLATELET
BASOS ABS: 0 10*3/uL (ref 0.0–0.1)
BASOS PCT: 0 %
EOS ABS: 0.2 10*3/uL (ref 0.0–0.7)
EOS PCT: 4 %
HCT: 34.2 % — ABNORMAL LOW (ref 39.0–52.0)
HEMOGLOBIN: 10.9 g/dL — AB (ref 13.0–17.0)
Lymphocytes Relative: 22 %
Lymphs Abs: 1 10*3/uL (ref 0.7–4.0)
MCH: 30.2 pg (ref 26.0–34.0)
MCHC: 31.9 g/dL (ref 30.0–36.0)
MCV: 94.7 fL (ref 78.0–100.0)
Monocytes Absolute: 0.6 10*3/uL (ref 0.1–1.0)
Monocytes Relative: 12 %
NEUTROS PCT: 61 %
Neutro Abs: 2.8 10*3/uL (ref 1.7–7.7)
PLATELETS: 111 10*3/uL — AB (ref 150–400)
RBC: 3.61 MIL/uL — AB (ref 4.22–5.81)
RDW: 16.1 % — ABNORMAL HIGH (ref 11.5–15.5)
WBC: 4.5 10*3/uL (ref 4.0–10.5)

## 2017-03-08 LAB — LACTATE DEHYDROGENASE: LDH: 125 U/L (ref 98–192)

## 2017-03-08 MED ORDER — HEPARIN SOD (PORK) LOCK FLUSH 100 UNIT/ML IV SOLN
500.0000 [IU] | Freq: Once | INTRAVENOUS | Status: AC
  Administered 2017-03-08: 500 [IU] via INTRAVENOUS

## 2017-03-08 MED ORDER — SODIUM CHLORIDE 0.9 % IV SOLN
INTRAVENOUS | Status: AC
  Administered 2017-03-08: 10:00:00 via INTRAVENOUS

## 2017-03-08 MED ORDER — HEPARIN SOD (PORK) LOCK FLUSH 100 UNIT/ML IV SOLN
INTRAVENOUS | Status: AC
  Filled 2017-03-08: qty 5

## 2017-03-08 NOTE — Progress Notes (Signed)
Tolerated IV fluids well. Stable and ambulatory on discharge home with wife. 

## 2017-03-08 NOTE — Addendum Note (Signed)
Addended by: Holley Bouche on: 03/08/2017 12:26 PM   Modules accepted: Orders

## 2017-03-08 NOTE — Patient Instructions (Addendum)
[place] at [facility] Discharge Instructions  RECOMMENDATIONS MADE BY THE CONSULTANT AND ANY TEST RESULTS WILL BE SENT TO YOUR REFERRING PHYSICIAN.  You were seen today by Mike Craze NP. Fluids today. Follow up in late June to see Dr. Talbert Cage to discuss maintenance Rituxan.  Force non-caffeinated fluids as much as tolerated.   Thank you for choosing [place] at [facility] to provide your oncology and hematology care.  To afford each patient quality time with our provider, please arrive at least 15 minutes before your scheduled appointment time.    If you have a lab appointment with the [facility] please come in thru the  Main Entrance and check in at the main information desk  You need to re-schedule your appointment should you arrive 10 or more minutes late.  We strive to give you quality time with our providers, and arriving late affects you and other patients whose appointments are after yours.  Also, if you no show three or more times for appointments you may be dismissed from the clinic at the providers discretion.     Again, thank you for choosing [facility].  Our hope is that these requests will decrease the amount of time that you wait before being seen by our physicians.       _____________________________________________________________  Should you have questions after your visit to [facility], please contact our office at [phone] between the hours of 8:30 a.m. and 4:30 p.m.  Voicemails left after 4:30 p.m. will not be returned until the following business day.  For prescription refill requests, have your pharmacy contact our office.       Resources For Cancer Patients and their Caregivers ? American Cancer Society: Can assist with transportation, wigs, general needs, runs Look Good Feel Better.        337-842-9921 ? Cancer Care: Provides financial assistance, online support groups,  medication/co-pay assistance.  1-800-813-HOPE (718)635-7270) ? [facility] Assists [place] cancer patients and their families through emotional , educational and financial support.  639-197-6393 ? [place] DSS Where to apply for food stamps, Medicaid and utility assistance. 989 747 7290 ? [facility]: Transportation to medical appointments. 204-446-7367 ? Social Security Administration: May apply for disability if have a Stage IV cancer. 803-057-8019 727-315-4690 ? [employer], Disability and Transit Services: Assists with nutrition, care and transit needs. [place] Support Programs: @10RELATIVEDAYS @ > Cancer Support Group  2nd Tuesday of the month 1pm-2pm, Journey Room  > Creative Journey  3rd Tuesday of the month 1130am-1pm, Journey Room  > Look Good Feel Better  1st Wednesday of the month 10am-12 noon, Journey Room (Call [facility] to register [phone])

## 2017-03-09 LAB — KAPPA/LAMBDA LIGHT CHAINS
KAPPA, LAMDA LIGHT CHAIN RATIO: 1.67 — AB (ref 0.26–1.65)
Kappa free light chain: 15.4 mg/L (ref 3.3–19.4)
Lambda free light chains: 9.2 mg/L (ref 5.7–26.3)

## 2017-03-11 LAB — MULTIPLE MYELOMA PANEL, SERUM
ALBUMIN SERPL ELPH-MCNC: 3.6 g/dL (ref 2.9–4.4)
ALBUMIN/GLOB SERPL: 1.8 — AB (ref 0.7–1.7)
ALPHA 1: 0.2 g/dL (ref 0.0–0.4)
ALPHA2 GLOB SERPL ELPH-MCNC: 0.5 g/dL (ref 0.4–1.0)
B-Globulin SerPl Elph-Mcnc: 0.8 g/dL (ref 0.7–1.3)
GAMMA GLOB SERPL ELPH-MCNC: 0.5 g/dL (ref 0.4–1.8)
GLOBULIN, TOTAL: 2.1 g/dL — AB (ref 2.2–3.9)
IgA: 79 mg/dL (ref 61–437)
IgG (Immunoglobin G), Serum: 570 mg/dL — ABNORMAL LOW (ref 700–1600)
IgM, Serum: 22 mg/dL (ref 15–143)
M Protein SerPl Elph-Mcnc: 0.2 g/dL — ABNORMAL HIGH
Total Protein ELP: 5.7 g/dL — ABNORMAL LOW (ref 6.0–8.5)

## 2017-03-14 ENCOUNTER — Telehealth (HOSPITAL_COMMUNITY): Payer: Self-pay | Admitting: Emergency Medicine

## 2017-03-14 NOTE — Telephone Encounter (Signed)
Paul Keith called and wanted to know how long the chemo would last in his system?  The chemo itself is out of his system but the effects will last 4-6 weeks.  He verbalized understanding.

## 2017-05-03 ENCOUNTER — Encounter (HOSPITAL_COMMUNITY): Payer: Medicare Other

## 2017-05-03 ENCOUNTER — Encounter (HOSPITAL_COMMUNITY): Payer: Self-pay

## 2017-05-03 ENCOUNTER — Encounter (HOSPITAL_COMMUNITY): Payer: Medicare Other | Attending: Oncology | Admitting: Oncology

## 2017-05-03 VITALS — BP 144/79 | HR 66 | Temp 98.1°F | Resp 16 | Ht 72.0 in | Wt 157.8 lb

## 2017-05-03 DIAGNOSIS — C880 Waldenstrom macroglobulinemia: Secondary | ICD-10-CM

## 2017-05-03 DIAGNOSIS — C8300 Small cell B-cell lymphoma, unspecified site: Secondary | ICD-10-CM | POA: Diagnosis not present

## 2017-05-03 DIAGNOSIS — R948 Abnormal results of function studies of other organs and systems: Secondary | ICD-10-CM

## 2017-05-03 DIAGNOSIS — Z95828 Presence of other vascular implants and grafts: Secondary | ICD-10-CM

## 2017-05-03 LAB — COMPREHENSIVE METABOLIC PANEL
ALBUMIN: 3.8 g/dL (ref 3.5–5.0)
ALK PHOS: 67 U/L (ref 38–126)
ALT: 15 U/L — AB (ref 17–63)
AST: 17 U/L (ref 15–41)
Anion gap: 8 (ref 5–15)
BUN: 17 mg/dL (ref 6–20)
CALCIUM: 8.9 mg/dL (ref 8.9–10.3)
CO2: 25 mmol/L (ref 22–32)
CREATININE: 1.3 mg/dL — AB (ref 0.61–1.24)
Chloride: 103 mmol/L (ref 101–111)
GFR calc non Af Amer: 49 mL/min — ABNORMAL LOW (ref >60)
GFR, EST AFRICAN AMERICAN: 56 mL/min — AB (ref >60)
GLUCOSE: 84 mg/dL (ref 65–99)
Potassium: 4.1 mmol/L (ref 3.5–5.1)
SODIUM: 136 mmol/L (ref 135–145)
Total Bilirubin: 0.7 mg/dL (ref 0.3–1.2)
Total Protein: 6 g/dL — ABNORMAL LOW (ref 6.5–8.1)

## 2017-05-03 LAB — CBC WITH DIFFERENTIAL/PLATELET
BASOS PCT: 0 %
Basophils Absolute: 0 10*3/uL (ref 0.0–0.1)
Eosinophils Absolute: 0.1 10*3/uL (ref 0.0–0.7)
Eosinophils Relative: 4 %
HCT: 36.5 % — ABNORMAL LOW (ref 39.0–52.0)
HEMOGLOBIN: 12.1 g/dL — AB (ref 13.0–17.0)
Lymphocytes Relative: 60 %
Lymphs Abs: 1.4 10*3/uL (ref 0.7–4.0)
MCH: 29.7 pg (ref 26.0–34.0)
MCHC: 33.2 g/dL (ref 30.0–36.0)
MCV: 89.5 fL (ref 78.0–100.0)
MONOS PCT: 23 %
Monocytes Absolute: 0.5 10*3/uL (ref 0.1–1.0)
NEUTROS ABS: 0.3 10*3/uL — AB (ref 1.7–7.7)
NEUTROS PCT: 14 %
Platelets: 140 10*3/uL — ABNORMAL LOW (ref 150–400)
RBC: 4.08 MIL/uL — ABNORMAL LOW (ref 4.22–5.81)
RDW: 14.2 % (ref 11.5–15.5)
WBC: 2.4 10*3/uL — ABNORMAL LOW (ref 4.0–10.5)

## 2017-05-03 LAB — LACTATE DEHYDROGENASE: LDH: 115 U/L (ref 98–192)

## 2017-05-03 MED ORDER — SODIUM CHLORIDE 0.9% FLUSH
10.0000 mL | INTRAVENOUS | Status: DC | PRN
  Administered 2017-05-03: 10 mL via INTRAVENOUS
  Filled 2017-05-03: qty 10

## 2017-05-03 MED ORDER — HEPARIN SOD (PORK) LOCK FLUSH 100 UNIT/ML IV SOLN
500.0000 [IU] | Freq: Once | INTRAVENOUS | Status: AC
  Administered 2017-05-03: 500 [IU] via INTRAVENOUS

## 2017-05-03 NOTE — Progress Notes (Signed)
Paul Keith presented for Portacath access and flush. Portacath located right chest wall accessed with  H 20 needle. Good blood return present. Portacath flushed with 23ml NS and 500U/80ml Heparin and needle removed intact. Procedure without incident. Patient tolerated procedure well. Labs drawn too.

## 2017-05-03 NOTE — Progress Notes (Signed)
[place], [place] [id number]   CLINIC:  Medical Oncology/Hematology  PCP:  Paul Graves, DO [address] [place] [address] [phone]   REASON FOR VISIT:  Follow-up for Waldenstrom's macroglobulinemia/malignant lymphoplasmacytic lymphoma   CURRENT THERAPY: s/p Bendamustine/Rituxan x 6 cycles   BRIEF ONCOLOGIC HISTORY:    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[facility])   05/06/2016 Procedure    Bone marrow aspiration and biopsy      05/10/2016 Pathology Results    Bone Marrow Flow Cytometry - MONOCLONAL B-CELL POPULATION IDENTIFIED. The pattern is somewhat nonspecific but the findings are consistent with non-Hodgkin B-cell lymphoma.      05/10/2016 Pathology Results    Bone Marrow, Aspirate,Biopsy, and Clot, right ilium - HYPERCELLULAR BONE MARROW WITH EXTENSIVE INVOLVEMENT BY NON-HODGKIN'S B-CELL LYMPHOMA.      05/13/2016 PET scan    Splenomegaly and mild diffuse increase metabolic activity in the spleen consistent with lymphomatous involvement. Diffuse osseous uptake likely due to diffuse lymphoma. Recommend correlation with any marrow stimulating drugs or rebound phenomenon.      05/21/2016 Initial Diagnosis    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[facility])      06/01/2016 Imaging    Circumferential diffuse thickening of the mid to distal sigmoid colon. Adjacent small amount of fluid has decreased since prior examination with interval clearing of previously noted small amount of extraluminal air.      06/03/2016 - 07/23/2016 Antibody Plan    Single-agent Rituxan      08/17/2016 -  Chemotherapy    The patient had palonosetron (ALOXI) injection 0.25 mg, 0.25 mg, Intravenous,  Once, 4 of 6 cycles  pegfilgrastim (NEULASTA ONPRO KIT) injection 6 mg, 6 mg, Subcutaneous, Once, 4 of 6 cycles  riTUXimab (RITUXAN) 700 mg in sodium chloride 0.9 % 250 mL (2.1875 mg/mL) chemo infusion, 375 mg/m2 = 700 mg, Intravenous,  Once, 4 of 6  cycles  bendamustine (BENDEKA) 175 mg in sodium chloride 0.9 % 50 mL (3.0702 mg/mL) chemo infusion, 90 mg/m2 = 175 mg, Intravenous,  Once, 4 of 6 cycles  for chemotherapy treatment.        11/25/2016 - 11/28/2016 Hospital Admission    Admit date: 11/25/2016 Admission diagnosis: Febrile neutropenia Additional comments: Gastroenteritis      12/24/2016 - 12/27/2016 Hospital Admission    Admit date: 12/24/2016 Admission diagnosis: Neutropenic fever Additional comments:       03/01/2017 PET scan    IMPRESSION: 1. Significant improvement in splenomegaly compared to the prior exam, previously 1400 cc, currently 580 cc. 2. Resolution of prior diffuse marrow metabolic activity. 3. High activity focus along the anorectal junction, left anterior 1 o'clock position, not present previously. This could be physiologic activity or new tumor, correlate with digital rectal exam. 4. Nonspecific free pelvic fluid in the pelvis with a small mesenteric acute ill-defined collection of fluid in the upper abdomen. The mesenteric fluid is slightly increased in volume compared to the prior chest CT. Cause for these fluid collections is uncertain. 5. No hypermetabolic adenopathy. 6. Coronary, aortic arch, and branch vessel atherosclerotic vascular disease. Aortoiliac atherosclerotic vascular disease. 7. Enlarged prostate gland. 8.  Prominent stool throughout the colon favors constipation.        INTERVAL HISTORY:  [name] 83 y.o. male returns for follow-up of Waldenstrom's macroglobulinemia/malignant lymphoplasmacytic lymphoma.   He is here today with his wife.  Patient has returned from history to [place]. He states that he is doing well. He is back at his  baseline and denies any fatigue, drenching night sweats, weight loss. He's actually gained weight since his last visit. He denies any recent infections, blurry vision, headaches, chest pain, abdominal pain, shortness of breath, focal  weakness.   REVIEW OF SYSTEMS:  Review of Systems  Constitutional: Negative for chills, fatigue and fever.  HENT:  Negative.  Negative for lump/mass.   Eyes: Negative.   Respiratory: Negative.  Negative for cough and shortness of breath.   Cardiovascular: Negative.  Negative for chest pain and leg swelling.  Gastrointestinal: Negative for abdominal pain, blood in stool, constipation, diarrhea, nausea, rectal pain and vomiting.  Endocrine: Negative.   Genitourinary: Negative for dysuria, frequency, hematuria and nocturia.   Musculoskeletal: Negative.  Negative for arthralgias.  Skin: Negative.  Negative for rash.  Neurological: Negative for dizziness and headaches.  Hematological: Negative.  Negative for adenopathy. Does not bruise/bleed easily.  Psychiatric/Behavioral: Negative.  Negative for depression and sleep disturbance. The patient is not nervous/anxious.      PAST MEDICAL/SURGICAL HISTORY:  Past Medical History:  Diagnosis Date  . Anemia 04/22/2016  . Diverticulosis    pt reports recent admission for diverticulosis perforation this past week  . 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[facility]) 05/21/2016  . Pancytopenia ([facility]) 04/22/2016   Past Surgical History:  Procedure Laterality Date  . CATARACT EXTRACTION Bilateral   . TONSILLECTOMY       SOCIAL HISTORY:  Social History   Social History  . Marital status: Married    Spouse name: N/A  . Number of children: N/A  . Years of education: N/A   Occupational History  . Not on file.   Social History Main Topics  . Smoking status: Former Smoker    Quit date: 09/15/1962  . Smokeless tobacco: Never Used  . Alcohol use Yes     Comment: OCCASSIONAL  . Drug use: No  . Sexual activity: Not on file     Comment: married   Other Topics Concern  . Not on file   Social History Narrative  . No narrative on file    FAMILY HISTORY:  History reviewed. No pertinent family  history.  CURRENT MEDICATIONS:  Outpatient Encounter Prescriptions as of 05/03/2017  Medication Sig  . acetaminophen (TYLENOL) 500 MG tablet Take 500 mg by mouth every 6 (six) hours as needed for mild pain.  . B Complex-C (B-COMPLEX WITH VITAMIN C) tablet Take 1 tablet by mouth daily.  . cholecalciferol (VITAMIN D) 1000 units tablet Take 1,000 Units by mouth daily.  . cyanocobalamin 100 MCG tablet Take by mouth daily. Pt reports unsure of dosage, takes once daily  . Multiple Vitamin (MULTIVITAMIN WITH MINERALS) TABS tablet Take 1 tablet by mouth daily.  . tamsulosin (FLOMAX) 0.4 MG CAPS capsule Take 0.8 mg by mouth daily after supper.    Facility-Administered Encounter Medications as of 05/03/2017  Medication  . [COMPLETED] heparin lock flush 100 unit/mL  . sodium chloride flush (NS) 0.9 % injection 10 mL    ALLERGIES:  No Known Allergies   PHYSICAL EXAM:  ECOG Performance status: 1 - Symptomatic, but independent.   Vitals:   05/03/17 1039  BP: (!) 144/79  Pulse: 66  Resp: 16  Temp: 98.1 F (36.7 C)   *Orthostatic VS as above.   Filed Weights   05/03/17 1039  Weight: 157 lb 12.8 oz (71.6 kg)    Physical Exam  Constitutional: He is oriented to person, place, and time and well-developed, well-nourished, and in  no distress. No distress.  HENT:  Head: Normocephalic and atraumatic.  Mouth/Throat: Oropharynx is clear and moist. No oropharyngeal exudate.  Eyes: Conjunctivae are normal. Pupils are equal, round, and reactive to light. No scleral icterus.  Neck: Normal range of motion. Neck supple. No JVD present.  Cardiovascular: Normal rate, regular rhythm and normal heart sounds.  Exam reveals no gallop and no friction rub.   No murmur heard. Pulmonary/Chest: Effort normal and breath sounds normal. No respiratory distress. He has no wheezes. He has no rales.  Abdominal: Soft. Bowel sounds are normal. He exhibits no distension. There is no tenderness. There is no rebound and  no guarding.  Genitourinary: Rectum normal.  Musculoskeletal: Normal range of motion. He exhibits no edema or tenderness.  Lymphadenopathy:    He has no cervical adenopathy.    He has no axillary adenopathy.       Right: No supraclavicular adenopathy present.       Left: No supraclavicular adenopathy present.  Neurological: He is alert and oriented to person, place, and time. No cranial nerve deficit. Gait normal.  Skin: Skin is warm and dry. No rash noted. No erythema. No pallor.  Psychiatric: Mood, memory, affect and judgment normal.  Nursing note and vitals reviewed.    LABORATORY DATA:  I have reviewed the labs as listed.  CBC    Component Value Date/Time   WBC 4.5 03/08/2017 0801   RBC 3.61 (L) 03/08/2017 0801   HGB 10.9 (L) 03/08/2017 0801   HCT 34.2 (L) 03/08/2017 0801   PLT 111 (L) 03/08/2017 0801   MCV 94.7 03/08/2017 0801   MCH 30.2 03/08/2017 0801   MCHC 31.9 03/08/2017 0801   RDW 16.1 (H) 03/08/2017 0801   LYMPHSABS 1.0 03/08/2017 0801   MONOABS 0.6 03/08/2017 0801   EOSABS 0.2 03/08/2017 0801   BASOSABS 0.0 03/08/2017 0801   CMP Latest Ref Rng & Units 03/08/2017 02/17/2017 02/01/2017  Glucose 65 - 99 mg/dL 74 109(H) 107(H)  BUN 6 - 20 mg/dL '15 16 20  ' Creatinine 0.61 - 1.24 mg/dL 1.15 1.34(H) 1.22  Sodium 135 - 145 mmol/L 135 136 138  Potassium 3.5 - 5.1 mmol/L 3.7 4.0 4.0  Chloride 101 - 111 mmol/L 101 104 104  CO2 22 - 32 mmol/L '26 26 26  ' Calcium 8.9 - 10.3 mg/dL 9.1 8.7(L) 9.0  Total Protein 6.5 - 8.1 g/dL 6.3(L) 5.6(L) 5.7(L)  Total Bilirubin 0.3 - 1.2 mg/dL 0.4 0.2(L) 0.4  Alkaline Phos 38 - 126 U/L 78 81 80  AST 15 - 41 U/L '26 20 25  ' ALT 17 - 63 U/L '19 21 19    ' PENDING LABS:    DIAGNOSTIC IMAGING:  Restaging PET scan: 03/01/17       PATHOLOGY:  Bone marrow biopsy: 05/06/16      ASSESSMENT & PLAN:   Waldenstrom's macroglobulinemia/malignant lymphoplasmacytic lymphoma:  -Diagnosed in 04/2016; s/p chemotherapy with Bendamustine/Rituxan x  6 cycles. Restaging PET scan done on 03/01/17 showed improvement in splenomegaly and resolution of bone marrow hypermetabolic activity. There is evidence of new hypermetabolic activity in anorectal region.  -Clinically, IgM responded very well to treatment with initial values 2,866 in 05/2016, with IgM 41 in 12/2016.  Serum IgM 23 today. - Discussed maintenance Rituxan vs active surveillance. I discussed that maintenance rituxan based on the study below has been shown to improve overall survival and PFS. I discussed that maintenance rituxan would be q2 months. Patient has tolerated rituxan well in the past without side effects.  Patient stated he wanted to think about what he wants to do and will get back to me.  -RTC in 3 months for follow up with labs.   Maintenance Rituximab is associated with improved clinical outcome in rituximab nave patients with Waldenstrom Macroglobulinaemia who respond to a rituximab-containing regimen. Treon SP1, Hanzis C, [place], Ioakimidis L, McArthur CJ, Hunter ZR, [place], Lincoln B.  Author information Abstract This study examined the outcome of 248 Waldenstrom macroglobulinaemia (WM) rituximab-nave patients who responded to a rituximab-containing regimen. Eighty-six patients (35%) subsequently received maintenance rituximab (M-Rituximab). No differences in baseline characteristics, and post-induction categorical responses between cohorts were observed. The median rituximab infusions during induction was 6 for both cohorts; and 8 over a 2-year period for patients receiving M-Rituximab. Categorical responses improved in 16/162 (10%) of observed, and 36/86 (418%) of M-Rituximab patients respectively, following induction therapy (P < 00001). Both progression-free (563 vs. 286 months; P = 00001) and overall survival (Not reached versus 116 months; P = 66815) were longer in patients who received M-Rituximab. Improved progression-free survival was evident despite  previous treatment status, induction with rituximab alone or in combination therapy (P ? 00001). Best serum IgM response was lower (P < 00001), and haematocrit higher (P = 0001) for patients receiving M-Rituximab. Among patients receiving M-Rituximab, an increased number of infectious events were observed, but were mainly ? grade 2 (P = 0008). The findings of this observational study suggest improved clinical outcomes following M-Rituximab in WM patients who respond to induction with a rituximab-containing regimen. Prospective studies aimed at clarifying the role of M-Rituximab therapy in WM patients are needed to confirm these findings.   All questions were answered to patient's stated satisfaction. Encouraged patient to call with any new concerns or questions before his next visit to the cancer center and we can certain see him sooner, if needed.    Orders placed this encounter:  Orders Placed This Encounter  Procedures  . Multiple Myeloma Panel (SPEP&IFE w/QIG)  . IgG, IgA, IgM  . Kappa/lambda light chains  . Beta 2 microglobuline, serum  . CBC with Differential  . Comprehensive metabolic panel      Twana First, MD

## 2017-05-03 NOTE — Patient Instructions (Signed)
[facility] at [facility] Discharge Instructions  RECOMMENDATIONS MADE BY THE CONSULTANT AND ANY TEST RESULTS WILL BE SENT TO YOUR REFERRING PHYSICIAN.  Port flush done  Follow up as scheduled.  Thank you for choosing [facility] at [facility] to provide your oncology and hematology care.  To afford each patient quality time with our provider, please arrive at least 15 minutes before your scheduled appointment time.    If you have a lab appointment with the Cancer Center please come in thru the  Main Entrance and check in at the main information desk  You need to re-schedule your appointment should you arrive 10 or more minutes late.  We strive to give you quality time with our providers, and arriving late affects you and other patients whose appointments are after yours.  Also, if you no show three or more times for appointments you may be dismissed from the clinic at the providers discretion.     Again, thank you for choosing [facility].  Our hope is that these requests will decrease the amount of time that you wait before being seen by our physicians.       _____________________________________________________________  Should you have questions after your visit to [facility], please contact our office at [phone] between the hours of 8:30 a.m. and 4:30 p.m.  Voicemails left after 4:30 p.m. will not be returned until the following business day.  For prescription refill requests, have your pharmacy contact our office.       Resources For Cancer Patients and their Caregivers ? American Cancer Society: Can assist with transportation, wigs, general needs, runs Look Good Feel Better.        1-888-227-6333 ? Cancer Care: Provides financial assistance, online support groups, medication/co-pay assistance.  1-800-813-HOPE (4673) ? [facility] Assists [place] cancer patients and their  families through emotional , educational and financial support.  336-427-4357 ? [place] DSS Where to apply for food stamps, Medicaid and utility assistance. 336-342-1394 ? [facility]: Transportation to medical appointments. 336-347-2287 ? Social Security Administration: May apply for disability if have a Stage IV cancer. 336-342-7796 1-800-772-1213 ? [place] Aging, Disability and Transit Services: Assists with nutrition, care and transit needs. 336-349-2343  Cancer Center Support Programs: @[email or web address]@ > Cancer Support Group  2nd Tuesday of the month 1pm-2pm, Journey Room  > Creative Journey  3rd Tuesday of the month 1130am-1pm, Journey Room  > Look Good Feel Better  1st Wednesday of the month 10am-12 noon, Journey Room (Call American Cancer Society to register 1-800-395-5775)   

## 2017-05-04 ENCOUNTER — Telehealth (HOSPITAL_COMMUNITY): Payer: Self-pay

## 2017-05-04 LAB — PROTEIN ELECTROPHORESIS, SERUM
A/G RATIO SPE: 1.7 (ref 0.7–1.7)
Albumin ELP: 3.6 g/dL (ref 2.9–4.4)
Alpha-1-Globulin: 0.2 g/dL (ref 0.0–0.4)
Alpha-2-Globulin: 0.5 g/dL (ref 0.4–1.0)
BETA GLOBULIN: 0.7 g/dL (ref 0.7–1.3)
GAMMA GLOBULIN: 0.6 g/dL (ref 0.4–1.8)
Globulin, Total: 2.1 g/dL — ABNORMAL LOW (ref 2.2–3.9)
M-SPIKE, %: 0.1 g/dL — AB
Total Protein ELP: 5.7 g/dL — ABNORMAL LOW (ref 6.0–8.5)

## 2017-05-04 LAB — IMMUNOFIXATION ELECTROPHORESIS
IGM, SERUM: 23 mg/dL (ref 15–143)
IgA: 66 mg/dL (ref 61–437)
IgG (Immunoglobin G), Serum: 616 mg/dL — ABNORMAL LOW (ref 700–1600)
TOTAL PROTEIN ELP: 5.6 g/dL — AB (ref 6.0–8.5)

## 2017-05-04 LAB — KAPPA/LAMBDA LIGHT CHAINS
KAPPA FREE LGHT CHN: 14.8 mg/L (ref 3.3–19.4)
KAPPA, LAMDA LIGHT CHAIN RATIO: 1.56 (ref 0.26–1.65)
LAMDA FREE LIGHT CHAINS: 9.5 mg/L (ref 5.7–26.3)

## 2017-05-04 LAB — IGG, IGA, IGM
IGA: 68 mg/dL (ref 61–437)
IgG (Immunoglobin G), Serum: 635 mg/dL — ABNORMAL LOW (ref 700–1600)
IgM, Serum: 23 mg/dL (ref 15–143)

## 2017-05-04 LAB — CEA: CEA: 4.6 ng/mL (ref 0.0–4.7)

## 2017-05-04 NOTE — Telephone Encounter (Signed)
Notified patient about Igm levels. He verbalized understanding.

## 2017-05-04 NOTE — Telephone Encounter (Signed)
-----   Message from Twana First, MD sent at 05/04/2017 12:38 PM EDT ----- Please call the patient and let him know that his IgM level is 23, which is good.

## 2017-06-29 ENCOUNTER — Encounter (HOSPITAL_COMMUNITY): Payer: Medicare Other | Attending: Oncology

## 2017-06-29 ENCOUNTER — Encounter (HOSPITAL_COMMUNITY): Payer: Self-pay

## 2017-06-29 VITALS — BP 119/50 | HR 69 | Temp 97.7°F | Resp 18

## 2017-06-29 DIAGNOSIS — C880 Waldenstrom macroglobulinemia: Secondary | ICD-10-CM | POA: Insufficient documentation

## 2017-06-29 DIAGNOSIS — C8300 Small cell B-cell lymphoma, unspecified site: Secondary | ICD-10-CM | POA: Diagnosis not present

## 2017-06-29 DIAGNOSIS — Z452 Encounter for adjustment and management of vascular access device: Secondary | ICD-10-CM | POA: Diagnosis not present

## 2017-06-29 DIAGNOSIS — Z95828 Presence of other vascular implants and grafts: Secondary | ICD-10-CM

## 2017-06-29 MED ORDER — HEPARIN SOD (PORK) LOCK FLUSH 100 UNIT/ML IV SOLN
INTRAVENOUS | Status: AC
  Filled 2017-06-29: qty 5

## 2017-06-29 MED ORDER — HEPARIN SOD (PORK) LOCK FLUSH 100 UNIT/ML IV SOLN
500.0000 [IU] | Freq: Once | INTRAVENOUS | Status: AC
  Administered 2017-06-29: 500 [IU] via INTRAVENOUS

## 2017-06-29 MED ORDER — SODIUM CHLORIDE 0.9% FLUSH
10.0000 mL | INTRAVENOUS | Status: DC | PRN
  Administered 2017-06-29: 10 mL via INTRAVENOUS
  Filled 2017-06-29: qty 10

## 2017-06-29 NOTE — Progress Notes (Signed)
Paul Keith tolerated port flush well without complaints or incident. Port accessed with 20 gauge needle with blood return noted then flushed with 10 ml NS and 5 ml Heparin easily per protocol. VSS Pt discharged self ambul;atory in satisfactory condition accompanied by his wife

## 2017-06-29 NOTE — Patient Instructions (Signed)
[facility] at [facility] Discharge Instructions  RECOMMENDATIONS MADE BY THE CONSULTANT AND ANY TEST RESULTS WILL BE SENT TO YOUR REFERRING PHYSICIAN.  Portacath flushed per protocol today. Follow-up as scheduled. Call clinic for any questions or concerns  Thank you for choosing  [facility] Center at [facility] to provide your oncology and hematology care.  To afford each patient quality time with our provider, please arrive at least 15 minutes before your scheduled appointment time.    If you have a lab appointment with the Cancer Center please come in thru the  Main Entrance and check in at the main information desk  You need to re-schedule your appointment should you arrive 10 or more minutes late.  We strive to give you quality time with our providers, and arriving late affects you and other patients whose appointments are after yours.  Also, if you no show three or more times for appointments you may be dismissed from the clinic at the providers discretion.     Again, thank you for choosing [facility].  Our hope is that these requests will decrease the amount of time that you wait before being seen by our physicians.       _____________________________________________________________  Should you have questions after your visit to [facility], please contact our office at [phone] between the hours of 8:30 a.m. and 4:30 p.m.  Voicemails left after 4:30 p.m. will not be returned until the following business day.  For prescription refill requests, have your pharmacy contact our office.       Resources For Cancer Patients and their Caregivers ? American Cancer Society: Can assist with transportation, wigs, general needs, runs Look Good Feel Better.        1-888-227-6333 ? Cancer Care: Provides financial assistance, online support groups, medication/co-pay assistance.  1-800-813-HOPE (4673) ? [facility] Assists [place] cancer patients and their families through emotional , educational and financial support.  336-427-4357 ? [place] DSS Where to apply for food stamps, Medicaid and utility assistance. 336-342-1394 ? [facility]: Transportation to medical appointments. 336-347-2287 ? Social Security Administration: May apply for disability if have a Stage IV cancer. 336-342-7796 1-800-772-1213 ? [place] Aging, Disability and Transit Services: Assists with nutrition, care and transit needs. 336-349-2343  Cancer Center Support Programs: @10RELATIVEDAYS@ > Cancer Support Group  2nd Tuesday of the month 1pm-2pm, Journey Room  > Creative Journey  3rd Tuesday of the month 1130am-1pm, Journey Room  > Look Good Feel Better  1st Wednesday of the month 10am-12 noon, Journey Room (Call American Cancer Society to register 1-800-395-5775)   

## 2017-08-03 ENCOUNTER — Encounter (HOSPITAL_COMMUNITY): Payer: Medicare Other

## 2017-08-03 ENCOUNTER — Encounter (HOSPITAL_COMMUNITY): Payer: Medicare Other | Attending: Oncology | Admitting: Oncology

## 2017-08-03 ENCOUNTER — Encounter (HOSPITAL_COMMUNITY): Payer: Self-pay | Admitting: Oncology

## 2017-08-03 DIAGNOSIS — C880 Waldenstrom macroglobulinemia: Secondary | ICD-10-CM

## 2017-08-03 DIAGNOSIS — C8300 Small cell B-cell lymphoma, unspecified site: Secondary | ICD-10-CM | POA: Diagnosis not present

## 2017-08-03 LAB — COMPREHENSIVE METABOLIC PANEL
ALBUMIN: 4.3 g/dL (ref 3.5–5.0)
ALK PHOS: 73 U/L (ref 38–126)
ALT: 17 U/L (ref 17–63)
AST: 19 U/L (ref 15–41)
Anion gap: 6 (ref 5–15)
BILIRUBIN TOTAL: 0.8 mg/dL (ref 0.3–1.2)
BUN: 19 mg/dL (ref 6–20)
CALCIUM: 9.3 mg/dL (ref 8.9–10.3)
CO2: 29 mmol/L (ref 22–32)
Chloride: 103 mmol/L (ref 101–111)
Creatinine, Ser: 1.37 mg/dL — ABNORMAL HIGH (ref 0.61–1.24)
GFR calc Af Amer: 53 mL/min — ABNORMAL LOW (ref >60)
GFR calc non Af Amer: 45 mL/min — ABNORMAL LOW (ref >60)
GLUCOSE: 96 mg/dL (ref 65–99)
Potassium: 4.2 mmol/L (ref 3.5–5.1)
SODIUM: 138 mmol/L (ref 135–145)
TOTAL PROTEIN: 6.8 g/dL (ref 6.5–8.1)

## 2017-08-03 LAB — CBC WITH DIFFERENTIAL/PLATELET
BASOS ABS: 0 10*3/uL (ref 0.0–0.1)
BASOS PCT: 0 %
EOS ABS: 0.1 10*3/uL (ref 0.0–0.7)
EOS PCT: 2 %
HCT: 38.3 % — ABNORMAL LOW (ref 39.0–52.0)
Hemoglobin: 12.8 g/dL — ABNORMAL LOW (ref 13.0–17.0)
Lymphocytes Relative: 30 %
Lymphs Abs: 1.3 10*3/uL (ref 0.7–4.0)
MCH: 31 pg (ref 26.0–34.0)
MCHC: 33.4 g/dL (ref 30.0–36.0)
MCV: 92.7 fL (ref 78.0–100.0)
MONO ABS: 0.6 10*3/uL (ref 0.1–1.0)
Monocytes Relative: 14 %
Neutro Abs: 2.4 10*3/uL (ref 1.7–7.7)
Neutrophils Relative %: 54 %
PLATELETS: 116 10*3/uL — AB (ref 150–400)
RBC: 4.13 MIL/uL — AB (ref 4.22–5.81)
RDW: 17 % — ABNORMAL HIGH (ref 11.5–15.5)
WBC: 4.4 10*3/uL (ref 4.0–10.5)

## 2017-08-03 NOTE — Progress Notes (Signed)
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 The pattern is somewhat nonspecific but the findings are consistent with non-Hodgkin B-cell lymphoma.      05/10/2016 Pathology Results    Bone Marrow, Aspirate,Biopsy, and Clot, right ilium - HYPERCELLULAR BONE MARROW WITH EXTENSIVE INVOLVEMENT BY NON-HODGKIN'S B-CELL LYMPHOMA.      05/13/2016 PET scan    Splenomegaly and mild diffuse increase metabolic activity in the spleen consistent with lymphomatous involvement. Diffuse osseous uptake likely due to diffuse lymphoma. Recommend correlation with any marrow stimulating drugs or rebound phenomenon.      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 Adjacent small amount of fluid has decreased since prior examination with interval clearing of previously noted small amount of extraluminal air.      06/03/2016 - 07/23/2016 Antibody Plan    Single-agent Rituxan      08/17/2016 -  Chemotherapy    The patient had palonosetron (ALOXI) injection 0.25 mg, 0.25 mg, Intravenous,  Once, 4 of 6 cycles  pegfilgrastim (NEULASTA ONPRO KIT) injection 6 mg, 6 mg, Subcutaneous, Once, 4 of 6 cycles  riTUXimab (RITUXAN) 700 mg in sodium chloride 0.9 % 250 mL (2.1875 mg/mL) chemo infusion, 375 mg/m2 = 700 mg, Intravenous,  Once, 4 of 6  cycles  bendamustine (BENDEKA) 175 mg in sodium chloride 0.9 % 50 mL (3.0702 mg/mL) chemo infusion, 90 mg/m2 = 175 mg, Intravenous,  Once, 4 of 6 cycles  for chemotherapy treatment.        11/25/2016 - 11/28/2016 Hospital Admission    Admit date: 11/25/2016 Admission diagnosis: Febrile neutropenia Additional comments: Gastroenteritis      12/24/2016 - 12/27/2016 Hospital Admission    Admit date: 12/24/2016 Admission diagnosis: Neutropenic fever Additional comments:       03/01/2017 PET scan    IMPRESSION: 1. Significant improvement in splenomegaly compared to the prior exam, previously 1400 cc, currently 580 cc. 2. Resolution of prior diffuse marrow metabolic activity. 3. High activity focus along the anorectal junction, left anterior 1 o'clock position, not present previously. This could be physiologic activity or new tumor, correlate with digital rectal exam. 4. Nonspecific free pelvic fluid in the pelvis with a small mesenteric acute ill-defined collection of fluid in the upper abdomen. The mesenteric fluid is slightly increased in volume compared to the prior chest CT. Cause for these fluid collections is uncertain. 5. No hypermetabolic adenopathy. 6. Coronary, aortic arch, and branch vessel atherosclerotic vascular disease. Aortoiliac atherosclerotic vascular disease. 7. Enlarged prostate gland. 8.  Prominent stool throughout the colon favors constipation.        INTERVAL HISTORY:  Paul Keith 83 y.o. male returns for follow-up of Waldenstrom's macroglobulinemia/malignant lymphoplasmacytic lymphoma.   Patient presents today for continued follow-up. He states he is feeling well. He has no complaints today. He denies any chest pain, shortness breath, abdominal pain, headaches,  blurry vision, focal weakness. He denies any worsening fatigue or drenching night sweats, weight loss, or unexplained fevers and chills.   REVIEW OF SYSTEMS:  Review of Systems  Constitutional:  Negative for chills, fatigue and fever.  HENT:  Negative.  Negative for lump/mass.   Eyes: Negative.   Respiratory: Negative.  Negative for cough and shortness of breath.   Cardiovascular: Negative.  Negative for chest pain and leg swelling.  Gastrointestinal: Negative for abdominal pain, blood in stool, constipation, diarrhea, nausea, rectal pain and vomiting.  Endocrine: Negative.   Genitourinary: Negative for dysuria, frequency, hematuria and nocturia.   Musculoskeletal: Negative.  Negative for arthralgias.  Skin: Negative.  Negative for rash.  Neurological: Negative for dizziness and headaches.  Hematological: Negative.  Negative for adenopathy. Does not bruise/bleed easily.  Psychiatric/Behavioral: Negative.  Negative for depression and sleep disturbance. The patient is not nervous/anxious.      PAST MEDICAL/SURGICAL HISTORY:  Past Medical History:  Diagnosis Date  . Anemia 04/22/2016  . Diverticulosis    pt reports recent admission for diverticulosis perforation this past week  . 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 Pancytopenia ([place]) 04/22/2016   Past Surgical History:  Procedure Laterality Date  . CATARACT EXTRACTION Bilateral   . TONSILLECTOMY       SOCIAL HISTORY:  Social History   Social History  . Marital status: Married    Spouse name: N/A  . Number of children: N/A  . Years of education: N/A   Occupational History  . Not on file.   Social History Main Topics  . Smoking status: Former Smoker    Quit date: 09/15/1962  . Smokeless tobacco: Never Used  . Alcohol use Yes     Comment: OCCASSIONAL  . Drug use: No  . Sexual activity: Not on file     Comment: married   Other Topics Concern  . Not on file   Social History Narrative  . No narrative on file    FAMILY HISTORY:  History reviewed. No pertinent family history.  CURRENT MEDICATIONS:  Outpatient Encounter Prescriptions as of  08/03/2017  Medication Sig  . acetaminophen (TYLENOL) 500 MG tablet Take 500 mg by mouth every 6 (six) hours as needed for mild pain.  . B Complex-C (B-COMPLEX WITH VITAMIN C) tablet Take 1 tablet by mouth daily.  . cholecalciferol (VITAMIN D) 1000 units tablet Take 1,000 Units by mouth daily.  . cyanocobalamin 100 MCG tablet Take by mouth daily. Pt reports unsure of dosage, takes once daily  . Multiple Vitamin (MULTIVITAMIN WITH MINERALS) TABS tablet Take 1 tablet by mouth daily.  . tamsulosin (FLOMAX) 0.4 MG CAPS capsule Take 0.8 mg by mouth daily after supper.    No facility-administered encounter medications on file as of 08/03/2017.     ALLERGIES:  No Known Allergies   PHYSICAL EXAM:  ECOG Performance status: 1 - Symptomatic, but independent.   There were no vitals filed for this visit. *Orthostatic VS as above.   There were no vitals filed for this visit.  Physical Exam  Constitutional: He is oriented to person, place, and time and well-developed, well-nourished, and in no distress. No distress.  HENT:  Head: Normocephalic and atraumatic.  Mouth/Throat: Oropharynx is clear and moist. No oropharyngeal exudate.  Eyes: Pupils are equal, round, and reactive to light. Conjunctivae are normal. No scleral icterus.  Neck: Normal range of motion. Neck supple. No JVD present.  Cardiovascular: Normal rate, regular rhythm and  normal heart sounds.  Exam reveals no gallop and no friction rub.   No murmur heard. Pulmonary/Chest: Effort normal and breath sounds normal. No respiratory distress. He has no wheezes. He has no rales.  Abdominal: Soft. Bowel sounds are normal. He exhibits no distension. There is no tenderness. There is no rebound and no guarding.  Genitourinary: Rectum normal.  Musculoskeletal: Normal range of motion. He exhibits no edema or tenderness.  Lymphadenopathy:    He has no cervical adenopathy.    He has no axillary adenopathy.       Right: No supraclavicular  adenopathy present.       Left: No supraclavicular adenopathy present.  Neurological: He is alert and oriented to person, place, and time. No cranial nerve deficit. Gait normal.  Skin: Skin is warm and dry. No rash noted. No erythema. No pallor.  Psychiatric: Mood, memory, affect and judgment normal.  Nursing note and vitals reviewed.    LABORATORY DATA:  I have reviewed the labs as listed.  CBC    Component Value Date/Time   WBC 2.4 (L) 05/03/2017 1041   RBC 4.08 (L) 05/03/2017 1041   HGB 12.1 (L) 05/03/2017 1041   HCT 36.5 (L) 05/03/2017 1041   PLT 140 (L) 05/03/2017 1041   MCV 89.5 05/03/2017 1041   MCH 29.7 05/03/2017 1041   MCHC 33.2 05/03/2017 1041   RDW 14.2 05/03/2017 1041   LYMPHSABS 1.4 05/03/2017 1041   MONOABS 0.5 05/03/2017 1041   EOSABS 0.1 05/03/2017 1041   BASOSABS 0.0 05/03/2017 1041   CMP Latest Ref Rng & Units 05/03/2017 03/08/2017 02/17/2017  Glucose 65 - 99 mg/dL 84 74 109(H)  BUN 6 - 20 mg/dL _0 Creatinine 0.61 - 1.24 mg/dL 1.30(H) 1.15 1.34(H)  Sodium 135 - 145 mmol/L 136 135 136  Potassium 3.5 - 5.1 mmol/L 4.1 3.7 4.0  Chloride 101 - 111 mmol/L 103 101 104  CO2 22 - 32 mmol/L _1 Calcium 8.9 - 10.3 mg/dL 8.9 9.1 8.7(L)  Total Protein 6.5 - 8.1 g/dL 6.0(L) 6.3(L) 5.6(L)  Total Bilirubin 0.3 - 1.2 mg/dL 0.7 0.4 0.2(L)  Alkaline Phos 38 - 126 U/L 67 78 81  AST 15 - 41 U/L _2 ALT 17 - 63 U/L 15(L) 19 21    PENDING LABS:    DIAGNOSTIC IMAGING:  Restaging PET scan: 03/01/17       PATHOLOGY:  Bone marrow biopsy: 05/06/16      ASSESSMENT & PLAN:   Waldenstrom's macroglobulinemia/malignant lymphoplasmacytic lymphoma:  -Diagnosed in 04/2016; s/p chemotherapy with Bendamustine/Rituxan x 6 cycles. Restaging PET scan done on 03/01/17 showed improvement in splenomegaly and resolution of bone marrow hypermetabolic activity. There is evidence of new hypermetabolic activity in anorectal region.  -Clinically, IgM responded very well  to treatment with initial values 2,866 in 05/2016, with IgM 41 in 12/2016.  Serum IgM 23 on 05/03/17. Patient did not get his labs yet today, he will go after clinic today. -He is asymptomatic at this time. - Discussed maintenance Rituxan vs active surveillance. I discussed that maintenance rituxan based on the study below has been shown to improve overall survival and PFS. I discussed that maintenance rituxan would be q2 months. Patient has tolerated rituxan well in the past without side effects. Patient stated he thought about it and has opted for observation. -RTC in 3 months for follow up with port flush and labs.  Orders Placed This Encounter  Procedures  . CBC with  Differential    Standing Status:   Future    Standing Expiration Date:   08/03/2018  . Comprehensive metabolic panel    Standing Status:   Future    Standing Expiration Date:   08/03/2018  . Kappa/lambda light chains    Standing Status:   Future    Standing Expiration Date:   08/03/2018  . Beta 2 microglobuline, serum    Standing Status:   Future    Standing Expiration Date:   08/03/2018  . IgG, IgA, IgM    Standing Status:   Future    Standing Expiration Date:   08/03/2018      Maintenance Rituximab is associated with improved clinical outcome in rituximab nave patients with Waldenstrom Macroglobulinaemia who respond to a rituximab-containing regimen. [place] SP1, Hanzis C, [place], Ioakimidis L, [place] CJ, [place] ZR, [place], [place] B.  Author information Abstract This study examined the outcome of 248 Waldenstrom macroglobulinaemia (WM) rituximab-nave patients who responded to a rituximab-containing regimen. Eighty-six patients (35%) subsequently received maintenance rituximab (M-Rituximab). No differences in baseline characteristics, and post-induction categorical responses between cohorts were observed. The median rituximab infusions during induction was 6 for both cohorts; and 8 over a 2-year period for patients  receiving M-Rituximab. Categorical responses improved in 16/162 (10%) of observed, and 36/86 (418%) of M-Rituximab patients respectively, following induction therapy (P < 00001). Both progression-free (563 vs. 286 months; P = 00001) and overall survival (Not reached versus 116 months; P = 11941) were longer in patients who received M-Rituximab. Improved progression-free survival was evident despite previous treatment status, induction with rituximab alone or in combination therapy (P ? 00001). Best serum IgM response was lower (P < 00001), and haematocrit higher (P = 0001) for patients receiving M-Rituximab. Among patients receiving M-Rituximab, an increased number of infectious events were observed, but were mainly ? grade 2 (P = 0008). The findings of this observational study suggest improved clinical outcomes following M-Rituximab in WM patients who respond to induction with a rituximab-containing regimen. Prospective studies aimed at clarifying the role of M-Rituximab therapy in WM patients are needed to confirm these findings.   All questions were answered to patient's stated satisfaction. Encouraged patient to call with any new concerns or questions before his next visit to the cancer center and we can certain see him sooner, if needed.    Twana First, MD

## 2017-08-04 LAB — KAPPA/LAMBDA LIGHT CHAINS
Kappa free light chain: 15.8 mg/L (ref 3.3–19.4)
Kappa, lambda light chain ratio: 1.66 — ABNORMAL HIGH (ref 0.26–1.65)
LAMDA FREE LIGHT CHAINS: 9.5 mg/L (ref 5.7–26.3)

## 2017-08-04 LAB — IGG, IGA, IGM
IGG (IMMUNOGLOBIN G), SERUM: 795 mg/dL (ref 700–1600)
IgA: 104 mg/dL (ref 61–437)
IgM (Immunoglobulin M), Srm: 26 mg/dL (ref 15–143)

## 2017-08-04 LAB — BETA 2 MICROGLOBULIN, SERUM: Beta-2 Microglobulin: 3.4 mg/L — ABNORMAL HIGH (ref 0.6–2.4)

## 2017-08-05 LAB — MULTIPLE MYELOMA PANEL, SERUM
ALBUMIN/GLOB SERPL: 1.5 (ref 0.7–1.7)
Albumin SerPl Elph-Mcnc: 3.7 g/dL (ref 2.9–4.4)
Alpha 1: 0.2 g/dL (ref 0.0–0.4)
Alpha2 Glob SerPl Elph-Mcnc: 0.6 g/dL (ref 0.4–1.0)
B-Globulin SerPl Elph-Mcnc: 0.9 g/dL (ref 0.7–1.3)
Gamma Glob SerPl Elph-Mcnc: 1 g/dL (ref 0.4–1.8)
Globulin, Total: 2.6 g/dL (ref 2.2–3.9)
IGA: 108 mg/dL (ref 61–437)
IGG (IMMUNOGLOBIN G), SERUM: 860 mg/dL (ref 700–1600)
IGM (IMMUNOGLOBULIN M), SRM: 26 mg/dL (ref 15–143)
M PROTEIN SERPL ELPH-MCNC: 0.6 g/dL — AB
TOTAL PROTEIN ELP: 6.3 g/dL (ref 6.0–8.5)

## 2017-09-29 ENCOUNTER — Encounter (HOSPITAL_COMMUNITY): Payer: Self-pay | Admitting: Oncology

## 2017-09-29 ENCOUNTER — Other Ambulatory Visit: Payer: Self-pay

## 2017-09-29 ENCOUNTER — Encounter (HOSPITAL_COMMUNITY): Payer: Medicare Other | Attending: Oncology | Admitting: Oncology

## 2017-09-29 ENCOUNTER — Encounter (HOSPITAL_COMMUNITY): Payer: Self-pay

## 2017-09-29 ENCOUNTER — Encounter (HOSPITAL_COMMUNITY): Payer: Medicare Other

## 2017-09-29 VITALS — BP 129/74 | HR 103 | Temp 97.8°F | Resp 16 | Ht 72.0 in | Wt 169.5 lb

## 2017-09-29 DIAGNOSIS — C880 Waldenstrom macroglobulinemia: Secondary | ICD-10-CM | POA: Insufficient documentation

## 2017-09-29 DIAGNOSIS — C8300 Small cell B-cell lymphoma, unspecified site: Secondary | ICD-10-CM | POA: Diagnosis not present

## 2017-09-29 DIAGNOSIS — Z79899 Other long term (current) drug therapy: Secondary | ICD-10-CM | POA: Diagnosis not present

## 2017-09-29 DIAGNOSIS — Z87891 Personal history of nicotine dependence: Secondary | ICD-10-CM | POA: Insufficient documentation

## 2017-09-29 DIAGNOSIS — Z8572 Personal history of non-Hodgkin lymphomas: Secondary | ICD-10-CM | POA: Insufficient documentation

## 2017-09-29 DIAGNOSIS — Z9221 Personal history of antineoplastic chemotherapy: Secondary | ICD-10-CM | POA: Insufficient documentation

## 2017-09-29 DIAGNOSIS — K529 Noninfective gastroenteritis and colitis, unspecified: Secondary | ICD-10-CM

## 2017-09-29 DIAGNOSIS — K5900 Constipation, unspecified: Secondary | ICD-10-CM

## 2017-09-29 DIAGNOSIS — Z95828 Presence of other vascular implants and grafts: Secondary | ICD-10-CM

## 2017-09-29 LAB — COMPREHENSIVE METABOLIC PANEL
ALK PHOS: 65 U/L (ref 38–126)
ALT: 18 U/L (ref 17–63)
ANION GAP: 3 — AB (ref 5–15)
AST: 22 U/L (ref 15–41)
Albumin: 4 g/dL (ref 3.5–5.0)
BILIRUBIN TOTAL: 0.6 mg/dL (ref 0.3–1.2)
BUN: 18 mg/dL (ref 6–20)
CALCIUM: 9.4 mg/dL (ref 8.9–10.3)
CO2: 29 mmol/L (ref 22–32)
Chloride: 103 mmol/L (ref 101–111)
Creatinine, Ser: 1.36 mg/dL — ABNORMAL HIGH (ref 0.61–1.24)
GFR, EST AFRICAN AMERICAN: 53 mL/min — AB (ref >60)
GFR, EST NON AFRICAN AMERICAN: 46 mL/min — AB (ref >60)
Glucose, Bld: 98 mg/dL (ref 65–99)
Potassium: 4.5 mmol/L (ref 3.5–5.1)
Sodium: 135 mmol/L (ref 135–145)
TOTAL PROTEIN: 6.4 g/dL — AB (ref 6.5–8.1)

## 2017-09-29 LAB — CBC WITH DIFFERENTIAL/PLATELET
BASOS ABS: 0 10*3/uL (ref 0.0–0.1)
BASOS PCT: 0 %
EOS ABS: 0.1 10*3/uL (ref 0.0–0.7)
Eosinophils Relative: 2 %
HEMATOCRIT: 37.7 % — AB (ref 39.0–52.0)
HEMOGLOBIN: 12.5 g/dL — AB (ref 13.0–17.0)
Lymphocytes Relative: 31 %
Lymphs Abs: 1 10*3/uL (ref 0.7–4.0)
MCH: 31.9 pg (ref 26.0–34.0)
MCHC: 33.2 g/dL (ref 30.0–36.0)
MCV: 96.2 fL (ref 78.0–100.0)
Monocytes Absolute: 0.6 10*3/uL (ref 0.1–1.0)
Monocytes Relative: 17 %
NEUTROS ABS: 1.6 10*3/uL — AB (ref 1.7–7.7)
NEUTROS PCT: 50 %
Platelets: 106 10*3/uL — ABNORMAL LOW (ref 150–400)
RBC: 3.92 MIL/uL — ABNORMAL LOW (ref 4.22–5.81)
RDW: 14.7 % (ref 11.5–15.5)
WBC: 3.2 10*3/uL — ABNORMAL LOW (ref 4.0–10.5)

## 2017-09-29 MED ORDER — SODIUM CHLORIDE 0.9% FLUSH
10.0000 mL | INTRAVENOUS | Status: DC | PRN
  Administered 2017-09-29: 10 mL via INTRAVENOUS
  Filled 2017-09-29: qty 10

## 2017-09-29 MED ORDER — HEPARIN SOD (PORK) LOCK FLUSH 100 UNIT/ML IV SOLN
500.0000 [IU] | Freq: Once | INTRAVENOUS | Status: AC
  Administered 2017-09-29: 500 [IU] via INTRAVENOUS
  Filled 2017-09-29: qty 5

## 2017-09-29 NOTE — Progress Notes (Signed)
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 The pattern is somewhat nonspecific but the findings are consistent with non-Hodgkin B-cell lymphoma.      05/10/2016 Pathology Results    Bone Marrow, Aspirate,Biopsy, and Clot, right ilium - HYPERCELLULAR BONE MARROW WITH EXTENSIVE INVOLVEMENT BY NON-HODGKIN'S B-CELL LYMPHOMA.      05/13/2016 PET scan    Splenomegaly and mild diffuse increase metabolic activity in the spleen consistent with lymphomatous involvement. Diffuse osseous uptake likely due to diffuse lymphoma. Recommend correlation with any marrow stimulating drugs or rebound phenomenon.      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 Adjacent small amount of fluid has decreased since prior examination with interval clearing of previously noted small amount of extraluminal air.      06/03/2016 - 07/23/2016 Antibody Plan    Single-agent Rituxan      08/17/2016 -  Chemotherapy    The patient had palonosetron (ALOXI) injection 0.25 mg, 0.25 mg, Intravenous,  Once, 4 of 6 cycles  pegfilgrastim (NEULASTA ONPRO KIT) injection 6 mg, 6 mg, Subcutaneous, Once, 4 of 6 cycles  riTUXimab (RITUXAN) 700 mg in sodium chloride 0.9 % 250 mL (2.1875 mg/mL) chemo infusion, 375 mg/m2 = 700 mg, Intravenous,  Once, 4 of 6 cycles  bendamustine (BENDEKA)  175 mg in sodium chloride 0.9 % 50 mL (3.0702 mg/mL) chemo infusion, 90 mg/m2 = 175 mg, Intravenous,  Once, 4 of 6 cycles  for chemotherapy treatment.        11/25/2016 - 11/28/2016 Hospital Admission    Admit date: 11/25/2016 Admission diagnosis: Febrile neutropenia Additional comments: Gastroenteritis      12/24/2016 - 12/27/2016 Hospital Admission    Admit date: 12/24/2016 Admission diagnosis: Neutropenic fever Additional comments:       03/01/2017 PET scan    IMPRESSION: 1. Significant improvement in splenomegaly compared to the prior exam, previously 1400 cc, currently 580 cc. 2. Resolution of prior diffuse marrow metabolic activity. 3. High activity focus along the anorectal junction, left anterior 1 o'clock position, not present previously. This could be physiologic activity or new tumor, correlate with digital rectal exam. 4. Nonspecific free pelvic fluid in the pelvis with a small mesenteric acute ill-defined collection of fluid in the upper abdomen. The mesenteric fluid is slightly increased in volume compared to the prior chest CT. Cause for these fluid collections is uncertain. 5. No hypermetabolic adenopathy. 6. Coronary, aortic arch, and branch vessel atherosclerotic vascular disease. Aortoiliac atherosclerotic vascular disease. 7. Enlarged prostate gland. 8.  Prominent stool throughout the colon favors constipation.        INTERVAL HISTORY:  [name] 83 y.o. male returns for follow-up of Waldenstrom's macroglobulinemia/malignant lymphoplasmacytic lymphoma.   Patient presents today for continued follow-up. He states he is feeling well. He has no complaints today. He denies any chest pain, shortness breath, abdominal pain, headaches, blurry vision, focal weakness.  He denies any worsening fatigue or drenching night sweats, weight loss, or unexplained fevers and chills. He has chronic diarrhea alternating with constipation at times.    REVIEW OF SYSTEMS:  Review  of Systems  Constitutional: Negative for chills, fatigue and fever.  HENT:  Negative.  Negative for lump/mass.   Eyes: Negative.   Respiratory: Negative.  Negative for cough and shortness of breath.   Cardiovascular: Negative.  Negative for chest pain and leg swelling.  Gastrointestinal: Negative for abdominal pain, blood in stool, constipation, diarrhea, nausea, rectal pain and vomiting.  Endocrine: Negative.   Genitourinary: Negative for dysuria, frequency, hematuria and nocturia.   Musculoskeletal: Negative.  Negative for arthralgias.  Skin: Negative.  Negative for rash.  Neurological: Negative for dizziness and headaches.  Hematological: Negative.  Negative for adenopathy. Does not bruise/bleed easily.  Psychiatric/Behavioral: Negative.  Negative for depression and sleep disturbance. The patient is not nervous/anxious.      PAST MEDICAL/SURGICAL HISTORY:  Past Medical History:  Diagnosis Date  . Anemia 04/22/2016  . Diverticulosis    pt reports recent admission for diverticulosis perforation this past week  . 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 Pancytopenia ([place]) 04/22/2016   Past Surgical History:  Procedure Laterality Date  . CATARACT EXTRACTION Bilateral   . TONSILLECTOMY       SOCIAL HISTORY:  Social History   Socioeconomic History  . Marital status: Married    Spouse name: Not on file  . Number of children: Not on file  . Years of education: Not on file  . Highest education level: Not on file  Social Needs  . Financial resource strain: Not on file  . Food insecurity - worry: Not on file  . Food insecurity - inability: Not on file  . Transportation needs - medical: Not on file  . Transportation needs - non-medical: Not on file  Occupational History  . Not on file  Tobacco Use  . Smoking status: Former Smoker    Last attempt to quit: 09/15/1962    Years since quitting: 55.0  . Smokeless  tobacco: Never Used  Substance and Sexual Activity  . Alcohol use: Yes    Comment: OCCASSIONAL  . Drug use: No  . Sexual activity: Not on file    Comment: married  Other Topics Concern  . Not on file  Social History Narrative  . Not on file    FAMILY HISTORY:  History reviewed. No pertinent family history.  CURRENT MEDICATIONS:  Outpatient Encounter Medications as of 09/29/2017  Medication Sig  . acetaminophen (TYLENOL) 500 MG tablet Take 500 mg by mouth every 6 (six) hours as needed for mild pain.  . B Complex-C (B-COMPLEX WITH VITAMIN C) tablet Take 1 tablet by mouth daily.  . cholecalciferol (VITAMIN D) 1000 units tablet Take 1,000 Units by mouth daily.  . cyanocobalamin 100 MCG tablet Take by mouth daily. Pt reports unsure of dosage, takes once daily  . Multiple Vitamin (MULTIVITAMIN WITH MINERALS) TABS tablet Take 1 tablet by mouth daily.  . tamsulosin (FLOMAX) 0.4 MG CAPS capsule Take 0.8 mg by mouth daily after supper.    No facility-administered encounter medications on file as of 09/29/2017.     ALLERGIES:  No Known Allergies   PHYSICAL EXAM:  ECOG Performance status: 1 - Symptomatic, but independent.   Vitals:   09/29/17 1059  BP: 129/74  Pulse: (!) 103  Resp: 16  Temp: 97.8 F (36.6 C)  SpO2:  98%     Filed Weights   09/29/17 1059  Weight: 169 lb 8 oz (76.9 kg)    Physical Exam  Constitutional: He is oriented to person, place, and time and well-developed, well-nourished, and in no distress. No distress.  HENT:  Head: Normocephalic and atraumatic.  Mouth/Throat: Oropharynx is clear and moist. No oropharyngeal exudate.  Eyes: Conjunctivae are normal. Pupils are equal, round, and reactive to light. No scleral icterus.  Neck: Normal range of motion. Neck supple. No JVD present.  Cardiovascular: Normal rate, regular rhythm and normal heart sounds. Exam reveals no gallop and no friction rub.  No murmur heard. Pulmonary/Chest: Effort normal and breath  sounds normal. No respiratory distress. He has no wheezes. He has no rales.  Abdominal: Soft. Bowel sounds are normal. He exhibits no distension. There is no tenderness. There is no rebound and no guarding.  Genitourinary: Rectum normal.  Musculoskeletal: Normal range of motion. He exhibits no edema or tenderness.  Lymphadenopathy:    He has no cervical adenopathy.    He has no axillary adenopathy.       Right: No supraclavicular adenopathy present.       Left: No supraclavicular adenopathy present.  Neurological: He is alert and oriented to person, place, and time. No cranial nerve deficit. Gait normal.  Skin: Skin is warm and dry. No rash noted. No erythema. No pallor.  Psychiatric: Mood, memory, affect and judgment normal.  Nursing note and vitals reviewed.    LABORATORY DATA:  I have reviewed the labs as listed.  CBC    Component Value Date/Time   WBC 4.4 08/03/2017 1205   RBC 4.13 (L) 08/03/2017 1205   HGB 12.8 (L) 08/03/2017 1205   HCT 38.3 (L) 08/03/2017 1205   PLT 116 (L) 08/03/2017 1205   MCV 92.7 08/03/2017 1205   MCH 31.0 08/03/2017 1205   MCHC 33.4 08/03/2017 1205   RDW 17.0 (H) 08/03/2017 1205   LYMPHSABS 1.3 08/03/2017 1205   MONOABS 0.6 08/03/2017 1205   EOSABS 0.1 08/03/2017 1205   BASOSABS 0.0 08/03/2017 1205   CMP Latest Ref Rng & Units 08/03/2017 05/03/2017 03/08/2017  Glucose 65 - 99 mg/dL 96 84 74  BUN 6 - 20 mg/dL '19 17 15  ' Creatinine 0.61 - 1.24 mg/dL 1.37(H) 1.30(H) 1.15  Sodium 135 - 145 mmol/L 138 136 135  Potassium 3.5 - 5.1 mmol/L 4.2 4.1 3.7  Chloride 101 - 111 mmol/L 103 103 101  CO2 22 - 32 mmol/L '29 25 26  ' Calcium 8.9 - 10.3 mg/dL 9.3 8.9 9.1  Total Protein 6.5 - 8.1 g/dL 6.8 6.0(L) 6.3(L)  Total Bilirubin 0.3 - 1.2 mg/dL 0.8 0.7 0.4  Alkaline Phos 38 - 126 U/L 73 67 78  AST 15 - 41 U/L '19 17 26  ' ALT 17 - 63 U/L 17 15(L) 19    PENDING LABS:    DIAGNOSTIC IMAGING:  Restaging PET scan: 03/01/17       PATHOLOGY:  Bone marrow  biopsy: 05/06/16      ASSESSMENT & PLAN:   Waldenstrom's macroglobulinemia/malignant lymphoplasmacytic lymphoma:  -Diagnosed in 04/2016; s/p chemotherapy with Bendamustine/Rituxan x 6 cycles. Restaging PET scan done on 03/01/17 showed improvement in splenomegaly and resolution of bone marrow hypermetabolic activity. There is evidence of new hypermetabolic activity in anorectal region.  -Clinically, IgM responded very well to treatment with initial values 2,866 in 05/2016, with IgM 41 in 12/2016.  Serum IgM 23 on 05/03/17, IgM was 28 on 08/03/17. Patient did not  get his labs yet today, he will get them with his port flush today.  -He is asymptomatic at this time. - Discussed maintenance Rituxan vs active surveillance. I discussed that maintenance rituxan based on the study below has been shown to improve overall survival and PFS. I discussed that maintenance rituxan would be q2 months. Patient has tolerated rituxan well in the past without side effects. Patient stated he thought about it and has opted for observation. -RTC in 4 months for follow up with port flush and labs.  Orders Placed This Encounter  Procedures  . CBC with Differential    Standing Status:   Standing    Number of Occurrences:   2    Standing Expiration Date:   09/29/2018  . Comprehensive metabolic panel    Standing Status:   Standing    Number of Occurrences:   2    Standing Expiration Date:   09/29/2018  . Kappa/lambda light chains    Standing Status:   Standing    Number of Occurrences:   2    Standing Expiration Date:   09/29/2018  . Immunofixation electrophoresis    Standing Status:   Standing    Number of Occurrences:   2    Standing Expiration Date:   09/29/2018  . Protein electrophoresis, serum    Standing Status:   Standing    Number of Occurrences:   2    Standing Expiration Date:   09/29/2018      Maintenance Rituximab is associated with improved clinical outcome in rituximab nave patients with  Waldenstrom Macroglobulinaemia who respond to a rituximab-containing regimen. [place] SP1, Hanzis C, [place], Ioakimidis L, [place] CJ, [place] ZR, [place], [place] B.  Author information Abstract This study examined the outcome of 248 Waldenstrom macroglobulinaemia (WM) rituximab-nave patients who responded to a rituximab-containing regimen. Eighty-six patients (35%) subsequently received maintenance rituximab (M-Rituximab). No differences in baseline characteristics, and post-induction categorical responses between cohorts were observed. The median rituximab infusions during induction was 6 for both cohorts; and 8 over a 2-year period for patients receiving M-Rituximab. Categorical responses improved in 16/162 (10%) of observed, and 36/86 (418%) of M-Rituximab patients respectively, following induction therapy (P < 00001). Both progression-free (563 vs. 286 months; P = 00001) and overall survival (Not reached versus 116 months; P = 09811) were longer in patients who received M-Rituximab. Improved progression-free survival was evident despite previous treatment status, induction with rituximab alone or in combination therapy (P ? 00001). Best serum IgM response was lower (P < 00001), and haematocrit higher (P = 0001) for patients receiving M-Rituximab. Among patients receiving M-Rituximab, an increased number of infectious events were observed, but were mainly ? grade 2 (P = 0008). The findings of this observational study suggest improved clinical outcomes following M-Rituximab in WM patients who respond to induction with a rituximab-containing regimen. Prospective studies aimed at clarifying the role of M-Rituximab therapy in WM patients are needed to confirm these findings.   All questions were answered to patient's stated satisfaction. Encouraged patient to call with any new concerns or questions before his next visit to the cancer center and we can certain see him sooner, if needed.     Twana First, MD

## 2017-09-29 NOTE — Patient Instructions (Addendum)
[place] at [facility] Discharge Instructions  RECOMMENDATIONS MADE BY THE CONSULTANT AND ANY TEST RESULTS WILL BE SENT TO YOUR REFERRING PHYSICIAN.  You had your port flushed and lab work drawn today Follow up as scheduled.  Thank you for choosing [place] at [facility] to provide your oncology and hematology care.  To afford each patient quality time with our provider, please arrive at least 15 minutes before your scheduled appointment time.    If you have a lab appointment with the [facility] please come in thru the  Main Entrance and check in at the main information desk  You need to re-schedule your appointment should you arrive 10 or more minutes late.  We strive to give you quality time with our providers, and arriving late affects you and other patients whose appointments are after yours.  Also, if you no show three or more times for appointments you may be dismissed from the clinic at the providers discretion.     Again, thank you for choosing [facility].  Our hope is that these requests will decrease the amount of time that you wait before being seen by our physicians.       _____________________________________________________________  Should you have questions after your visit to [facility], please contact our office at [phone] between the hours of 8:30 a.m. and 4:30 p.m.  Voicemails left after 4:30 p.m. will not be returned until the following business day.  For prescription refill requests, have your pharmacy contact our office.       Resources For Cancer Patients and their Caregivers ? American Cancer Society: Can assist with transportation, wigs, general needs, runs Look Good Feel Better.        410-835-8520 ? Cancer Care: Provides financial assistance, online support groups, medication/co-pay assistance.  1-800-813-HOPE (517) 239-8488) ? [facility] Assists  [place] cancer patients and their families through emotional , educational and financial support.  (954) 213-8715 ? [place] DSS Where to apply for food stamps, Medicaid and utility assistance. 281-571-8013 ? RCATS: Transportation to medical appointments. 231-555-6184 ? Social Security Administration: May apply for disability if have a Stage IV cancer. (732)818-9010 (732)047-3380 ? [employer], Disability and Transit Services: Assists with nutrition, care and transit needs. [place] Support Programs: @10RELATIVEDAYS @ > Cancer Support Group  2nd Tuesday of the month 1pm-2pm, Journey Room  > Creative Journey  3rd Tuesday of the month 1130am-1pm, Journey Room  > Look Good Feel Better  1st Wednesday of the month 10am-12 noon, Journey Room (Call American Cancer Society to register [phone])   [place] at [facility]  Discharge Instructions:  Port flushed today.  _______________________________________________________________  Thank you for choosing [place] at [facility] to provide your oncology and hematology care.  To afford each patient quality time with our providers, please arrive at least 15 minutes before your scheduled appointment.  You need to re-schedule your appointment if you arrive 10 or more minutes late.  We strive to give you quality time with our providers, and arriving late affects you and other patients whose appointments are after yours.  Also, if you no show three or more times for appointments you may be dismissed from the clinic.  Again, thank you for choosing [place] at [place] hope is that these requests will allow you access to exceptional care and  in a timely manner. _______________________________________________________________  If you have questions after your visit, please contact our office at [phone] between the hours of 8:30  a.m. and 5:00 p.m. Voicemails left after 4:30 p.m. will not be returned until the following business day. _______________________________________________________________  For prescription refill requests, have your pharmacy contact our office. _______________________________________________________________  Recommendations made by the consultant and any test results will be sent to your referring physician. _______________________________________________________________

## 2017-09-29 NOTE — Progress Notes (Signed)
Port flushed and labs drawn per protocol.  Port site clean and dry with no bruising or swelling noted at site.  Band aid applied.  VSS with discharge and left ambulatory with wife.  No s/s of distress noted.

## 2017-09-30 LAB — PROTEIN ELECTROPHORESIS, SERUM
A/G Ratio: 1.5 (ref 0.7–1.7)
Albumin ELP: 3.6 g/dL (ref 2.9–4.4)
Alpha-1-Globulin: 0.2 g/dL (ref 0.0–0.4)
Alpha-2-Globulin: 0.6 g/dL (ref 0.4–1.0)
Beta Globulin: 0.9 g/dL (ref 0.7–1.3)
Gamma Globulin: 0.7 g/dL (ref 0.4–1.8)
Globulin, Total: 2.4 g/dL (ref 2.2–3.9)
M-Spike, %: 0.2 g/dL — ABNORMAL HIGH
Total Protein ELP: 6 g/dL (ref 6.0–8.5)

## 2017-09-30 LAB — KAPPA/LAMBDA LIGHT CHAINS
KAPPA, LAMDA LIGHT CHAIN RATIO: 1.74 — AB (ref 0.26–1.65)
Kappa free light chain: 16.5 mg/L (ref 3.3–19.4)
LAMDA FREE LIGHT CHAINS: 9.5 mg/L (ref 5.7–26.3)

## 2017-10-03 LAB — IMMUNOFIXATION ELECTROPHORESIS
IGG (IMMUNOGLOBIN G), SERUM: 671 mg/dL — AB (ref 700–1600)
IgA: 124 mg/dL (ref 61–437)
IgM (Immunoglobulin M), Srm: 23 mg/dL (ref 15–143)
Total Protein ELP: 6.1 g/dL (ref 6.0–8.5)

## 2017-11-29 ENCOUNTER — Inpatient Hospital Stay (HOSPITAL_COMMUNITY): Payer: Medicare Other | Attending: Hematology and Oncology

## 2017-11-29 ENCOUNTER — Encounter (HOSPITAL_COMMUNITY): Payer: Self-pay

## 2017-11-29 ENCOUNTER — Other Ambulatory Visit: Payer: Self-pay

## 2017-11-29 DIAGNOSIS — C8300 Small cell B-cell lymphoma, unspecified site: Secondary | ICD-10-CM | POA: Diagnosis not present

## 2017-11-29 DIAGNOSIS — Z452 Encounter for adjustment and management of vascular access device: Secondary | ICD-10-CM | POA: Diagnosis not present

## 2017-11-29 DIAGNOSIS — C880 Waldenstrom macroglobulinemia: Secondary | ICD-10-CM | POA: Diagnosis present

## 2017-11-29 LAB — COMPREHENSIVE METABOLIC PANEL
ALBUMIN: 3.8 g/dL (ref 3.5–5.0)
ALK PHOS: 72 U/L (ref 38–126)
ALT: 18 U/L (ref 17–63)
AST: 20 U/L (ref 15–41)
Anion gap: 8 (ref 5–15)
BUN: 16 mg/dL (ref 6–20)
CO2: 27 mmol/L (ref 22–32)
CREATININE: 1.4 mg/dL — AB (ref 0.61–1.24)
Calcium: 9.7 mg/dL (ref 8.9–10.3)
Chloride: 101 mmol/L (ref 101–111)
GFR calc Af Amer: 51 mL/min — ABNORMAL LOW (ref >60)
GFR, EST NON AFRICAN AMERICAN: 44 mL/min — AB (ref >60)
GLUCOSE: 102 mg/dL — AB (ref 65–99)
POTASSIUM: 4.5 mmol/L (ref 3.5–5.1)
Sodium: 136 mmol/L (ref 135–145)
TOTAL PROTEIN: 6.1 g/dL — AB (ref 6.5–8.1)
Total Bilirubin: 0.6 mg/dL (ref 0.3–1.2)

## 2017-11-29 LAB — CBC WITH DIFFERENTIAL/PLATELET
BASOS ABS: 0 10*3/uL (ref 0.0–0.1)
BASOS PCT: 0 %
Eosinophils Absolute: 0.3 10*3/uL (ref 0.0–0.7)
Eosinophils Relative: 6 %
HEMATOCRIT: 38.2 % — AB (ref 39.0–52.0)
HEMOGLOBIN: 12.1 g/dL — AB (ref 13.0–17.0)
LYMPHS PCT: 19 %
Lymphs Abs: 1 10*3/uL (ref 0.7–4.0)
MCH: 29.6 pg (ref 26.0–34.0)
MCHC: 31.7 g/dL (ref 30.0–36.0)
MCV: 93.4 fL (ref 78.0–100.0)
MONO ABS: 0.7 10*3/uL (ref 0.1–1.0)
Monocytes Relative: 14 %
NEUTROS PCT: 61 %
Neutro Abs: 3.1 10*3/uL (ref 1.7–7.7)
Platelets: 125 10*3/uL — ABNORMAL LOW (ref 150–400)
RBC: 4.09 MIL/uL — AB (ref 4.22–5.81)
RDW: 14.1 % (ref 11.5–15.5)
WBC: 5.2 10*3/uL (ref 4.0–10.5)

## 2017-11-29 MED ORDER — HEPARIN SOD (PORK) LOCK FLUSH 100 UNIT/ML IV SOLN
500.0000 [IU] | Freq: Once | INTRAVENOUS | Status: AC
  Administered 2017-11-29: 500 [IU] via INTRAVENOUS
  Filled 2017-11-29: qty 5

## 2017-11-29 MED ORDER — SODIUM CHLORIDE 0.9% FLUSH
10.0000 mL | INTRAVENOUS | Status: DC | PRN
  Administered 2017-11-29: 10 mL via INTRAVENOUS
  Filled 2017-11-29: qty 10

## 2017-11-29 NOTE — Progress Notes (Signed)
Paul Keith presented for Portacath access and flush. Portacath located right chest wall accessed with  H 20 needle.  Good blood return present. Portacath flushed with 64ml NS and 500U/70ml Heparin and needle removed intact.  Procedure tolerated well and without incident.  Discharged ambulatory.

## 2017-11-30 LAB — IGG, IGA, IGM
IgA: 93 mg/dL (ref 61–437)
IgG (Immunoglobin G), Serum: 539 mg/dL — ABNORMAL LOW (ref 700–1600)
IgM (Immunoglobulin M), Srm: 36 mg/dL (ref 15–143)

## 2017-11-30 LAB — KAPPA/LAMBDA LIGHT CHAINS
KAPPA, LAMDA LIGHT CHAIN RATIO: 1.74 — AB (ref 0.26–1.65)
Kappa free light chain: 21.4 mg/L — ABNORMAL HIGH (ref 3.3–19.4)
Lambda free light chains: 12.3 mg/L (ref 5.7–26.3)

## 2017-11-30 LAB — BETA 2 MICROGLOBULIN, SERUM: BETA 2 MICROGLOBULIN: 3.7 mg/L — AB (ref 0.6–2.4)

## 2018-01-27 ENCOUNTER — Inpatient Hospital Stay (HOSPITAL_COMMUNITY): Payer: Medicare Other | Attending: Hematology and Oncology | Admitting: Internal Medicine

## 2018-01-27 ENCOUNTER — Other Ambulatory Visit: Payer: Self-pay

## 2018-01-27 ENCOUNTER — Inpatient Hospital Stay (HOSPITAL_COMMUNITY): Payer: Medicare Other

## 2018-01-27 ENCOUNTER — Encounter (HOSPITAL_COMMUNITY): Payer: Self-pay | Admitting: Internal Medicine

## 2018-01-27 VITALS — BP 141/72 | HR 76 | Temp 97.7°F | Resp 18 | Wt 161.6 lb

## 2018-01-27 DIAGNOSIS — C880 Waldenstrom macroglobulinemia: Secondary | ICD-10-CM

## 2018-01-27 DIAGNOSIS — C8300 Small cell B-cell lymphoma, unspecified site: Secondary | ICD-10-CM | POA: Diagnosis present

## 2018-01-27 MED ORDER — HEPARIN SOD (PORK) LOCK FLUSH 100 UNIT/ML IV SOLN
INTRAVENOUS | Status: AC
  Filled 2018-01-27: qty 5

## 2018-01-27 NOTE — Patient Instructions (Signed)
[place] at [facility] Discharge Instructions   You were seen today by Dr. Zoila Shutter She went over you labs from January and they were good. She discussed having your port removed and monitoring your labs. We will refer you back to Dr. Tye [place] in [place] for port removal. Return in June for labs and follow up.    Thank you for choosing [place] at [facility] to provide your oncology and hematology care.  To afford each patient quality time with our provider, please arrive at least 15 minutes before your scheduled appointment time.    If you have a lab appointment with the [facility] please come in thru the  Main Entrance and check in at the main information desk  You need to re-schedule your appointment should you arrive 10 or more minutes late.  We strive to give you quality time with our providers, and arriving late affects you and other patients whose appointments are after yours.  Also, if you no show three or more times for appointments you may be dismissed from the clinic at the providers discretion.     Again, thank you for choosing [facility].  Our hope is that these requests will decrease the amount of time that you wait before being seen by our physicians.       _____________________________________________________________  Should you have questions after your visit to [facility], please contact our office at [phone] between the hours of 8:30 a.m. and 4:30 p.m.  Voicemails left after 4:30 p.m. will not be returned until the following business day.  For prescription refill requests, have your pharmacy contact our office.       Resources For Cancer Patients and their Caregivers ? American Cancer Society: Can assist with transportation, wigs, general needs, runs Look Good Feel Better.        (479)662-0495 ? Cancer Care: Provides financial assistance, online support groups, medication/co-pay  assistance.  1-800-813-HOPE 989-590-1392) ? [facility] Assists [place] cancer patients and their families through emotional , educational and financial support.  (603)482-2450 ? [place] DSS Where to apply for food stamps, Medicaid and utility assistance. 385-713-3487 ? RCATS: Transportation to medical appointments. 7727964125 ? Social Security Administration: May apply for disability if have a Stage IV cancer. 810-464-0229 (516)689-9942 ? [employer], Disability and Transit Services: Assists with nutrition, care and transit needs. [place] Support Programs:   > Cancer Support Group  2nd Tuesday of the month 1pm-2pm, Journey Room   > Creative Journey  3rd Tuesday of the month 1130am-1pm, Journey Room

## 2018-01-27 NOTE — Progress Notes (Signed)
Diagnosis Waldenstrom macroglobulinemia ([place]) - Plan: CBC with Differential/Platelet, Comprehensive metabolic panel, Lactate dehydrogenase, Protein electrophoresis, serum, IgG, IgA, IgM, Beta 2 microglobuline, serum, Kappa/lambda light chains  Staging Cancer Staging No matching staging information was found for the patient.  Assessment and Plan:  1.  Waldenstrm's macroglobulinemia/lymphoplasmacytic lymphoma.  Patient was treated in the past with bendamustine and Rituxan last treated in 2017.  Diagnosed in 04/2016; s/p chemotherapy with Bendamustine/Rituxan x 6 cycles. Restaging PET scan done on 03/01/17 showed improvement in splenomegaly and resolution of bone marrow hypermetabolic activity. There is evidence of new hypermetabolic activity in anorectal region. May consider repeat imaging to document resolution.   IgM responded very well to treatment with initial values 2,866 in 05/2016, with IgM 41 in 12/2016.  Labs done 11/29/2017 showed IGM 36.  CBC shows WBC 5.2 HB 12 plts 125.000.    Dr. Talbert Cage discussed maintenance Rituxan vs active surveillance based on study that showed increased overall survival and PFS. I discussed that maintenance rituxan would be q2 months. Patient has tolerated rituxan well in the past without side effects. Patient stated he thought about it and has opted for observation.  He will RTC in 04/2018 for follow-up and repeat labs.    INTERVAL HISTORY: 84 year old male with Waldenstrom's macroglobulinemia/malignant lymphoplasmacytic lymphoma.  He was treated in the past with bendamustine and Rituxan.  The patient was followed by Dr. Talbert Cage.    Current status: Patient is seen today for follow-up.  Reports he is doing well.  Denies any headaches, blurry vision, weakness, fatigue, night sweats, weight loss, fevers or chills.  He is here today to go over his lab studies.    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[place])   05/06/2016 Procedure    Bone marrow aspiration and biopsy       05/10/2016 Pathology Results    Bone Marrow Flow Cytometry - MONOCLONAL B-CELL POPULATION IDENTIFIED. The pattern is somewhat nonspecific but the findings are consistent with non-Hodgkin B-cell lymphoma.      05/10/2016 Pathology Results    Bone Marrow, Aspirate,Biopsy, and Clot, right ilium - HYPERCELLULAR BONE MARROW WITH EXTENSIVE INVOLVEMENT BY NON-HODGKIN'S B-CELL LYMPHOMA.      05/13/2016 PET scan    Splenomegaly and mild diffuse increase metabolic activity in the spleen consistent with lymphomatous involvement. Diffuse osseous uptake likely due to diffuse lymphoma. Recommend correlation with any marrow stimulating drugs or rebound phenomenon.      05/21/2016 Initial Diagnosis    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[place])      06/01/2016 Imaging    Circumferential diffuse thickening of the mid to distal sigmoid colon. Adjacent small amount of fluid has decreased since prior examination with interval clearing of previously noted small amount of extraluminal air.      06/03/2016 - 07/23/2016 Antibody Plan    Single-agent Rituxan      08/17/2016 -  Chemotherapy    The patient had palonosetron (ALOXI) injection 0.25 mg, 0.25 mg, Intravenous,  Once, 4 of 6 cycles  pegfilgrastim (NEULASTA ONPRO KIT) injection 6 mg, 6 mg, Subcutaneous, Once, 4 of 6 cycles  riTUXimab (RITUXAN) 700 mg in sodium chloride 0.9 % 250 mL (2.1875 mg/mL) chemo infusion, 375 mg/m2 = 700 mg, Intravenous,  Once, 4 of 6 cycles  bendamustine (BENDEKA) 175 mg in sodium chloride 0.9 % 50 mL (3.0702 mg/mL) chemo infusion, 90 mg/m2 = 175 mg, Intravenous,  Once, 4 of 6 cycles  for chemotherapy treatment.        11/25/2016 - 11/28/2016 Hospital Admission  Admit date: 11/25/2016 Admission diagnosis: Febrile neutropenia Additional comments: Gastroenteritis      12/24/2016 - 12/27/2016 Hospital Admission    Admit date: 12/24/2016 Admission diagnosis: Neutropenic fever Additional comments:       03/01/2017 PET scan     IMPRESSION: 1. Significant improvement in splenomegaly compared to the prior exam, previously 1400 cc, currently 580 cc. 2. Resolution of prior diffuse marrow metabolic activity. 3. High activity focus along the anorectal junction, left anterior 1 o'clock position, not present previously. This could be physiologic activity or new tumor, correlate with digital rectal exam. 4. Nonspecific free pelvic fluid in the pelvis with a small mesenteric acute ill-defined collection of fluid in the upper abdomen. The mesenteric fluid is slightly increased in volume compared to the prior chest CT. Cause for these fluid collections is uncertain. 5. No hypermetabolic adenopathy. 6. Coronary, aortic arch, and branch vessel atherosclerotic vascular disease. Aortoiliac atherosclerotic vascular disease. 7. Enlarged prostate gland. 8.  Prominent stool throughout the colon favors constipation.       Problem List Patient Active Problem List   Diagnosis Date Noted  . Neutropenic fever ([place]) [D70.9, R50.81] 11/25/2016  . Urinary frequency [R35.0] 09/14/2016  . At high risk for infection due to neutropenia ([place]) [D70.9] 07/23/2016  . Waldenstrom macroglobulinemia ([place]) [C88.0] 05/21/2016  . 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[place]) [C83.00] 05/21/2016  . IgM monoclonal gammopathy of uncertain significance [D47.2] 05/07/2016  . Lymphoproliferative disease ([place]) [D47.9] 05/07/2016  . Anemia [D64.9] 04/22/2016  . Pancytopenia [facility]) [X41.287] 04/22/2016    Past Medical History Past Medical History:  Diagnosis Date  . Anemia 04/22/2016  . Diverticulosis    pt reports recent admission for diverticulosis perforation this past week  . 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[place]) 05/21/2016  . Pancytopenia ([place]) 04/22/2016    Past Surgical History Past Surgical History:  Procedure Laterality Date  . CATARACT EXTRACTION Bilateral   . TONSILLECTOMY       Family History History reviewed. No pertinent family history.   Social History  reports that he quit smoking about 55 years ago. he has never used smokeless tobacco. He reports that he drinks alcohol. He reports that he does not use drugs.  Medications  Current Outpatient Medications:  .  acetaminophen (TYLENOL) 500 MG tablet, Take 500 mg by mouth every 6 (six) hours as needed for mild pain., Disp: , Rfl:  .  B Complex-C (B-COMPLEX WITH VITAMIN C) tablet, Take 1 tablet by mouth daily., Disp: , Rfl:  .  cholecalciferol (VITAMIN D) 1000 units tablet, Take 1,000 Units by mouth daily., Disp: , Rfl:  .  cyanocobalamin 100 MCG tablet, Take by mouth daily. Pt reports unsure of dosage, takes once daily, Disp: , Rfl:  .  Multiple Vitamin (MULTIVITAMIN WITH MINERALS) TABS tablet, Take 1 tablet by mouth daily., Disp: , Rfl:  .  tamsulosin (FLOMAX) 0.4 MG CAPS capsule, Take 0.8 mg by mouth daily after supper. , Disp: , Rfl:   Allergies Patient has no known allergies.  Review of Systems Review of Systems - Oncology ROS as per HPI otherwise 12 point ROS is negative.   Physical Exam  Vitals Wt Readings from Last 3 Encounters:  01/27/18 161 lb 9.6 oz (73.3 kg)  11/29/17 165 lb 6.4 oz (75 kg)  09/29/17 169 lb 8 oz (76.9 kg)   Temp Readings from Last 3 Encounters:  01/27/18 97.7 F (36.5 C) (Oral)  11/29/17 97.9 F (36.6 C) (Oral)  09/29/17 97.8 F (36.6  C) (Oral)   BP Readings from Last 3 Encounters:  01/27/18 (!) 141/72  11/29/17 121/71  09/29/17 129/74   Pulse Readings from Last 3 Encounters:  01/27/18 76  11/29/17 78  09/29/17 (!) 103    Constitutional: Well-developed, well-nourished, and in no distress.   HENT: Head: Normocephalic and atraumatic.  Mouth/Throat: No oropharyngeal exudate. Mucosa moist. Eyes: Pupils are equal, round, and reactive to light. Conjunctivae are normal. No scleral icterus.  Neck: Normal range of motion. Neck supple. No JVD present.   Cardiovascular: Normal rate, regular rhythm and normal heart sounds.  Exam reveals no gallop and no friction rub.   No murmur heard. Pulmonary/Chest: Effort normal and breath sounds normal. No respiratory distress. No wheezes.No rales.  Abdominal: Soft. Bowel sounds are normal. No distension. There is no tenderness. There is no guarding.  Musculoskeletal: No edema or tenderness.  Lymphadenopathy: No cervical, axillary or supraclavicular adenopathy.  Neurological: Alert and oriented to person, place, and time. No cranial nerve deficit.  Skin: Skin is warm and dry. No rash noted. No erythema. No pallor.  Psychiatric: Affect and judgment normal.   Labs No visits with results within 3 Day(s) from this visit.  Latest known visit with results is:  Infusion on 11/29/2017  Component Date Value Ref Range Status  . IgG (Immunoglobin G), Serum 11/29/2017 539* 700 - 1,600 mg/dL Final  . IgA 11/29/2017 93  61 - 437 mg/dL Final  . IgM (Immunoglobulin M), Srm 11/29/2017 36  15 - 143 mg/dL Final   Comment: (NOTE) Performed At: [facility] [place], [place] [id number] Rush Farmer MD [id number]   . Beta-2 Microglobulin 11/29/2017 3.7* 0.6 - 2.4 mg/L Final   Comment: (NOTE) Siemens Immulite 2000 Immunochemiluminometric assay (ICMA) Values obtained with different assay methods or kits cannot be used interchangeably. Results cannot be interpreted as absolute evidence of the presence or absence of malignant disease. Performed At: [facility] [place], [place] [id number] Rush Farmer MD [id number]   . WBC 11/29/2017 5.2  4.0 - 10.5 K/uL Final  . RBC 11/29/2017 4.09* 4.22 - 5.81 MIL/uL Final  . Hemoglobin 11/29/2017 12.1* 13.0 - 17.0 g/dL Final  . HCT 11/29/2017 38.2* 39.0 - 52.0 % Final  . MCV 11/29/2017 93.4  78.0 - 100.0 fL Final  . MCH 11/29/2017 29.6  26.0 - 34.0 pg Final  . MCHC 11/29/2017 31.7  30.0 - 36.0 g/dL Final  . RDW 11/29/2017  14.1  11.5 - 15.5 % Final  . Platelets 11/29/2017 125* 150 - 400 K/uL Final  . Neutrophils Relative % 11/29/2017 61  % Final  . Neutro Abs 11/29/2017 3.1  1.7 - 7.7 K/uL Final  . Lymphocytes Relative 11/29/2017 19  % Final  . Lymphs Abs 11/29/2017 1.0  0.7 - 4.0 K/uL Final  . Monocytes Relative 11/29/2017 14  % Final  . Monocytes Absolute 11/29/2017 0.7  0.1 - 1.0 K/uL Final  . Eosinophils Relative 11/29/2017 6  % Final  . Eosinophils Absolute 11/29/2017 0.3  0.0 - 0.7 K/uL Final  . Basophils Relative 11/29/2017 0  % Final  . Basophils Absolute 11/29/2017 0.0  0.0 - 0.1 K/uL Final  . Sodium 11/29/2017 136  135 - 145 mmol/L Final  . Potassium 11/29/2017 4.5  3.5 - 5.1 mmol/L Final  . Chloride 11/29/2017 101  101 - 111 mmol/L Final  . CO2 11/29/2017 27  22 - 32 mmol/L Final  . Glucose, Bld 11/29/2017 102* 65 - 99 mg/dL  Final  . BUN 11/29/2017 16  6 - 20 mg/dL Final  . Creatinine, Ser 11/29/2017 1.40* 0.61 - 1.24 mg/dL Final  . Calcium 11/29/2017 9.7  8.9 - 10.3 mg/dL Final  . Total Protein 11/29/2017 6.1* 6.5 - 8.1 g/dL Final  . Albumin 11/29/2017 3.8  3.5 - 5.0 g/dL Final  . AST 11/29/2017 20  15 - 41 U/L Final  . ALT 11/29/2017 18  17 - 63 U/L Final  . Alkaline Phosphatase 11/29/2017 72  38 - 126 U/L Final  . Total Bilirubin 11/29/2017 0.6  0.3 - 1.2 mg/dL Final  . GFR calc non Af Amer 11/29/2017 44* >60 mL/min Final  . GFR calc Af Amer 11/29/2017 51* >60 mL/min Final   Comment: (NOTE) The eGFR has been calculated using the CKD EPI equation. This calculation has not been validated in all clinical situations. eGFR's persistently <60 mL/min signify possible Chronic Kidney Disease.   . Anion gap 11/29/2017 8  5 - 15 Final  . Kappa free light chain 11/29/2017 21.4* 3.3 - 19.4 mg/L Final  . Lamda free light chains 11/29/2017 12.3  5.7 - 26.3 mg/L Final  . Kappa, lamda light chain ratio 11/29/2017 1.74* 0.26 - 1.65 Final   Comment: (NOTE) Performed At: [facility] [place], [place] [id number] Rush Farmer MD [id number]      Pathology Orders Placed This Encounter  Procedures  . CBC with Differential/Platelet    Standing Status:   Future    Standing Expiration Date:   01/28/2019  . Comprehensive metabolic panel    Standing Status:   Future    Standing Expiration Date:   01/28/2019  . Lactate dehydrogenase    Standing Status:   Future    Standing Expiration Date:   01/28/2019  . Protein electrophoresis, serum    Standing Status:   Future    Standing Expiration Date:   01/28/2019  . IgG, IgA, IgM    Standing Status:   Future    Standing Expiration Date:   01/28/2019  . Beta 2 microglobuline, serum    Standing Status:   Future    Standing Expiration Date:   01/28/2019  . Kappa/lambda light chains    Standing Status:   Future    Standing Expiration Date:   01/28/2019       Zoila Shutter MD

## 2018-01-27 NOTE — Progress Notes (Signed)
Portacath to be removed so port flush/lab draw not needed today per MD. Pt to have labs drawn prior to next MD appt

## 2018-01-30 ENCOUNTER — Encounter (HOSPITAL_COMMUNITY): Payer: Self-pay | Admitting: Lab

## 2018-01-30 NOTE — Progress Notes (Unsigned)
Referral sent to Dr Juliann Pulse in [place] for port removal.  The office is to contact patient.

## 2018-02-20 DIAGNOSIS — C8590 Non-Hodgkin lymphoma, unspecified, unspecified site: Secondary | ICD-10-CM | POA: Insufficient documentation

## 2018-03-14 IMAGING — CT CT ABD-PELV W/ CM
2 of 5 series · 15 of 46 positions shown, 17 images · IV contrast (APPLIED)
Comparison: 06/07/2016, 05/16/2016

CLINICAL DATA: Recent diverticulitis, history of lymphoma

EXAM:
CT ABDOMEN AND PELVIS WITH CONTRAST
TECHNIQUE: Multidetector CT imaging of the abdomen and pelvis was performed
using the standard protocol following bolus administration of
intravenous contrast.
CONTRAST:  100mL 518AYS-Q66 IOPAMIDOL (518AYS-Q66) INJECTION 61%

[Series 2: axial st · axial · 0.68mm/px · z∈[-626,-186]mm · 12 of 100 slices shown, 14 images]
[im 6/100  soft-tissue]
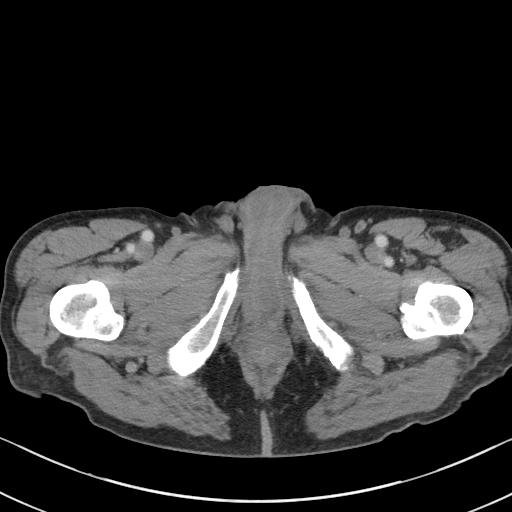
[im 6/100  bone]
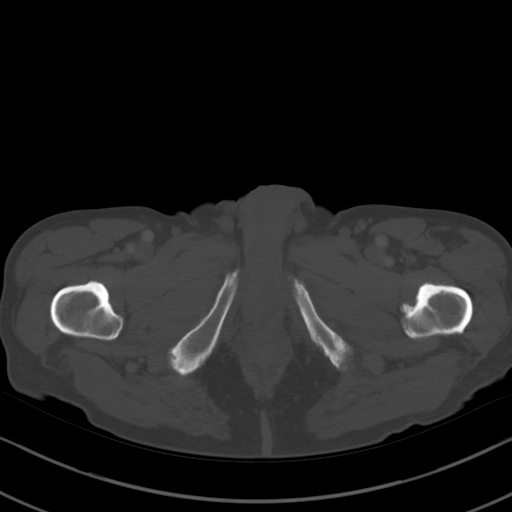
[im 18/100  soft-tissue]
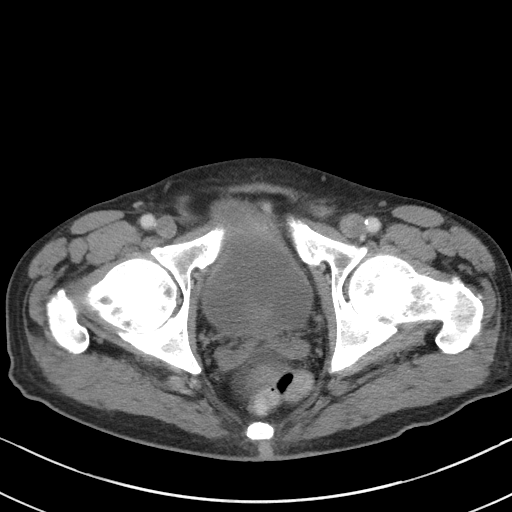
[im 24/100  soft-tissue]
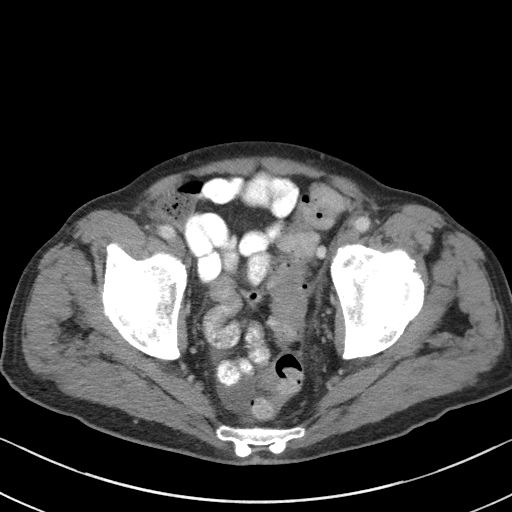
[im 30/100  soft-tissue]
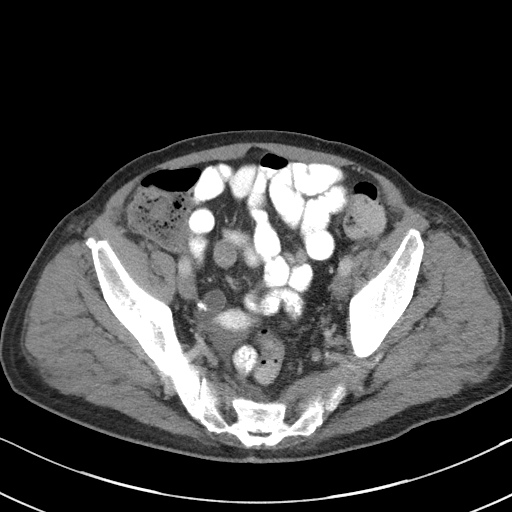
[im 41/100  soft-tissue]
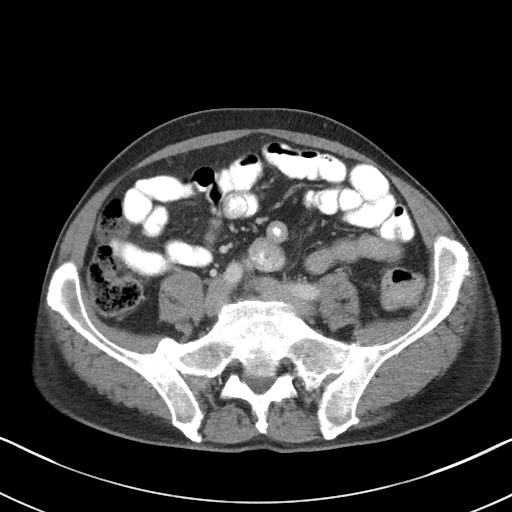
[im 47/100  soft-tissue]
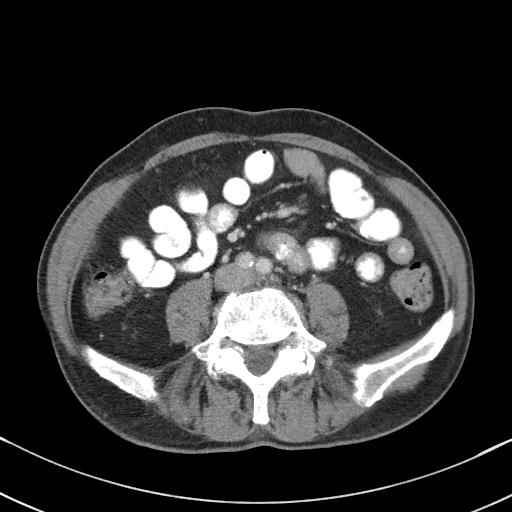
[im 53/100  soft-tissue]
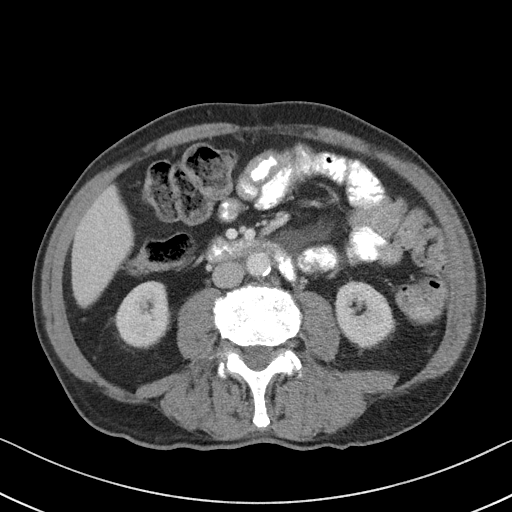
[im 65/100  soft-tissue]
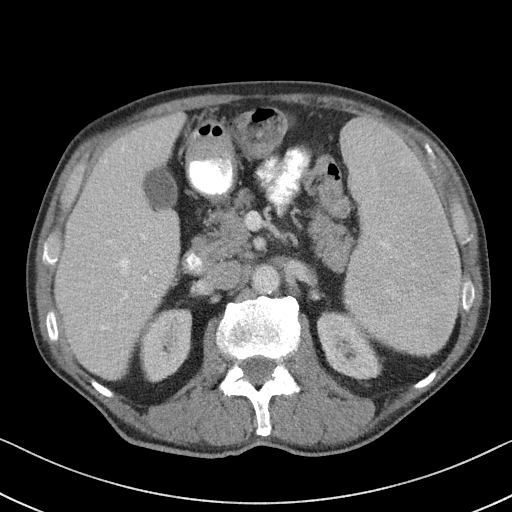
[im 70/100  soft-tissue]
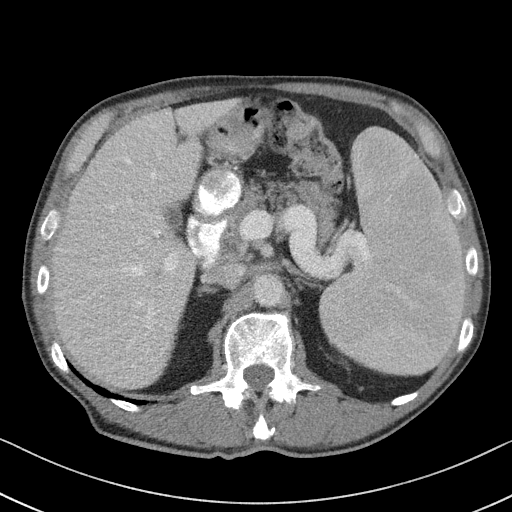
[im 70/100  bone]
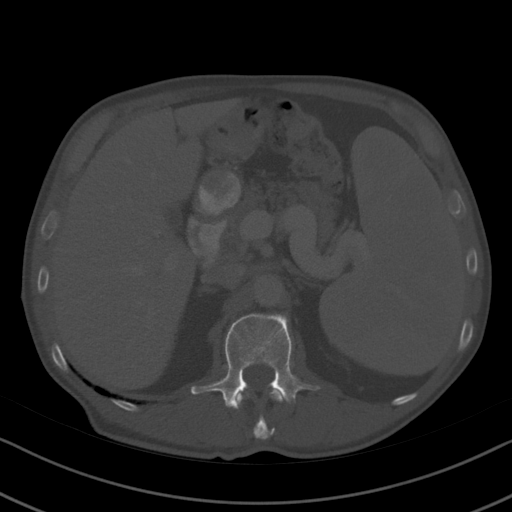
[im 76/100  soft-tissue]
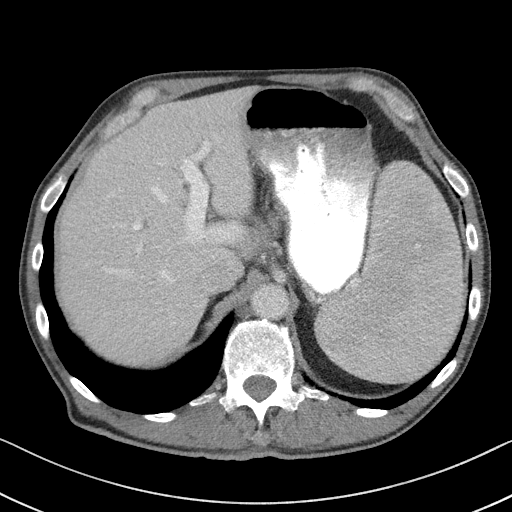
[im 88/100  soft-tissue]
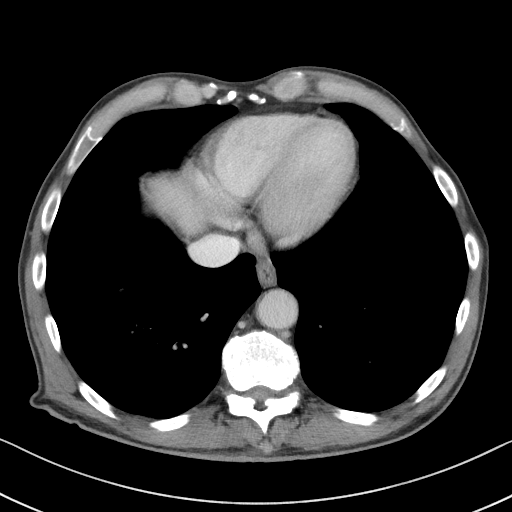
[im 94/100  soft-tissue]
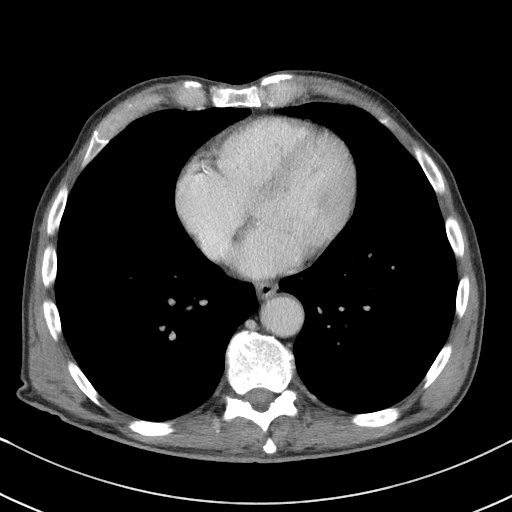

[Series 4: coronal st · coronal · 0.65mm/px · 3 of 94 slices shown]
[im 32/94  soft-tissue]
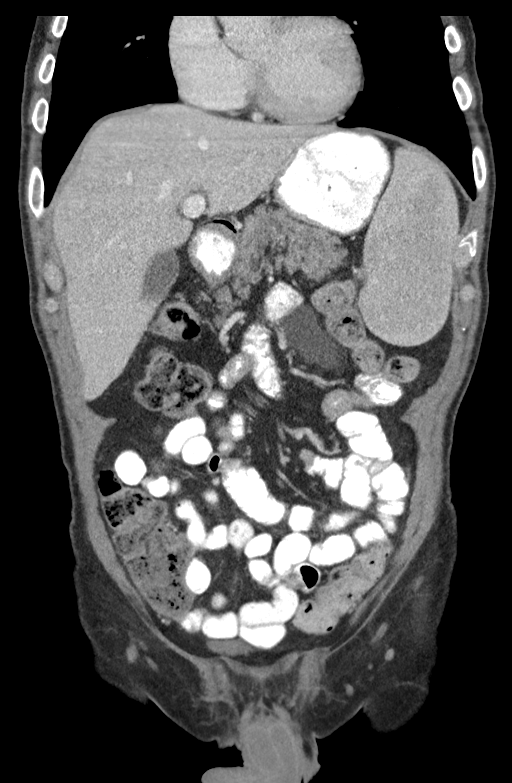
[im 42/94  soft-tissue]
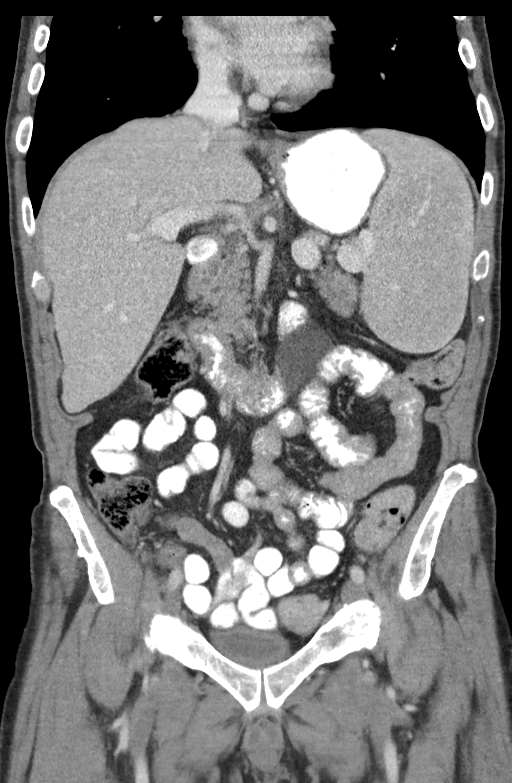
[im 52/94  soft-tissue]
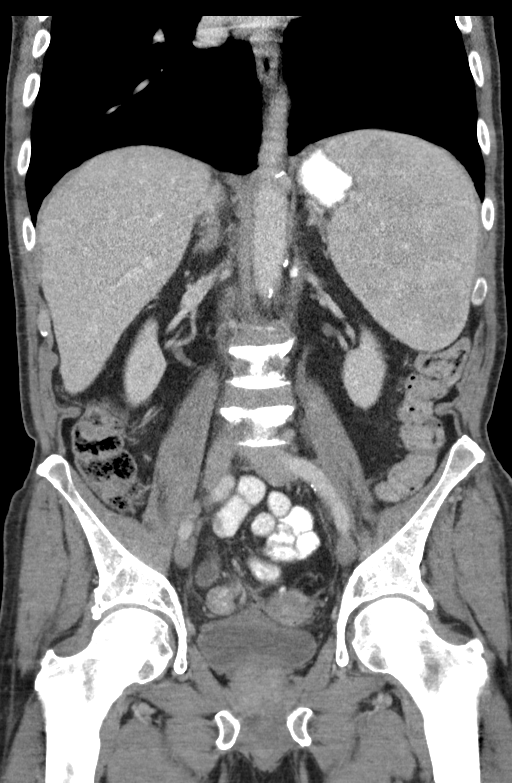

[15 of 46 positions shown; findings below may reference images not displayed]

FINDINGS: Lower chest:  No acute abnormality.

Hepatobiliary: No focal hepatic abnormality or biliary dilatation.
Gallbladder and biliary system unremarkable.

Pancreas: Unremarkable. No pancreatic ductal dilatation or
surrounding inflammatory changes.

Spleen: Spleen remains enlarged measuring 15 cm in craniocaudal
length, previously 17 cm. Stable enlargement of the splenic vein and
portal vein which may be secondary to the chronic splenomegaly
versus a component of portal hypertension. No significant varices
demonstrated

Adrenals/Urinary Tract: Normal adrenal glands. Kidneys demonstrate
symmetric normal enhancement and excretion. No renal obstruction or
hydronephrosis. No obstructing ureteral calculus. Stable mild
distention of the right distal ureter in the pelvis compared to
several prior exams, image 70. This remains nonspecific. Bladder is
underdistended paired grossly unremarkable.

Stomach/Bowel: Negative bowel obstruction, significant dilatation,
ileus, or free air. Colonic diverticulosis noted again, most
pronounced in the sigmoid region. Previous sigmoid diverticulitis
shows improvement in the left hemipelvis. Small amount of residual
entrapped abdominal ascites within the central mesentery and
dependent pelvic free fluid remains nonspecific. Single focus of
entrapped air remains within the pericolonic fat, image 77
compatible with sequelae from the prior perforation.

Vascular/Lymphatic: Stable atherosclerosis of the abdominal aorta
without aneurysm or dissection. Negative for adenopathy.

Reproductive: Stable prostate enlargement.

Other: No inguinal abnormality or hernia.  Intact abdominal wall.

Musculoskeletal: Stable chronic L4 and L5 compression fractures.
Stable chronic multilevel facet arthropathy of the lumbar spine. No
acute osseous finding.
IMPRESSION: Resolving/improved sigmoid diverticulitis compared to 05/16/2016 and
06/07/2016.

Stable small amount of abdominal pelvic ascites.

Small residual entrapped focus of extraluminal air within the pelvis
at the previous perforation compatible with sequelae from prior
perforated diverticulitis.

No current abdominal or pelvic abscess.

Stable splenomegaly

Abdominal atherosclerosis

## 2018-04-05 DIAGNOSIS — J019 Acute sinusitis, unspecified: Secondary | ICD-10-CM | POA: Diagnosis not present

## 2018-04-11 DIAGNOSIS — M25551 Pain in right hip: Secondary | ICD-10-CM | POA: Diagnosis not present

## 2018-04-18 ENCOUNTER — Encounter (HOSPITAL_COMMUNITY): Payer: Self-pay | Admitting: Hematology

## 2018-04-18 ENCOUNTER — Inpatient Hospital Stay (HOSPITAL_COMMUNITY): Payer: Medicare Other

## 2018-04-18 ENCOUNTER — Inpatient Hospital Stay (HOSPITAL_COMMUNITY): Payer: Medicare Other | Attending: Hematology | Admitting: Hematology

## 2018-04-18 VITALS — BP 145/73 | HR 104 | Temp 98.3°F | Resp 16 | Wt 163.0 lb

## 2018-04-18 DIAGNOSIS — C8300 Small cell B-cell lymphoma, unspecified site: Secondary | ICD-10-CM | POA: Insufficient documentation

## 2018-04-18 DIAGNOSIS — C880 Waldenstrom macroglobulinemia: Secondary | ICD-10-CM

## 2018-04-18 DIAGNOSIS — Z87891 Personal history of nicotine dependence: Secondary | ICD-10-CM | POA: Diagnosis not present

## 2018-04-18 LAB — CBC WITH DIFFERENTIAL/PLATELET
BASOS PCT: 0 %
Basophils Absolute: 0 10*3/uL (ref 0.0–0.1)
Eosinophils Absolute: 0.1 10*3/uL (ref 0.0–0.7)
Eosinophils Relative: 0 %
HEMATOCRIT: 41 % (ref 39.0–52.0)
HEMOGLOBIN: 13.1 g/dL (ref 13.0–17.0)
Lymphocytes Relative: 7 %
Lymphs Abs: 1 10*3/uL (ref 0.7–4.0)
MCH: 30.1 pg (ref 26.0–34.0)
MCHC: 32 g/dL (ref 30.0–36.0)
MCV: 94.3 fL (ref 78.0–100.0)
MONOS PCT: 6 %
Monocytes Absolute: 0.9 10*3/uL (ref 0.1–1.0)
NEUTROS ABS: 12.6 10*3/uL — AB (ref 1.7–7.7)
NEUTROS PCT: 87 %
Platelets: 192 10*3/uL (ref 150–400)
RBC: 4.35 MIL/uL (ref 4.22–5.81)
RDW: 14.8 % (ref 11.5–15.5)
WBC: 14.5 10*3/uL — ABNORMAL HIGH (ref 4.0–10.5)

## 2018-04-18 LAB — COMPREHENSIVE METABOLIC PANEL
ALBUMIN: 4 g/dL (ref 3.5–5.0)
ALK PHOS: 71 U/L (ref 38–126)
ALT: 28 U/L (ref 17–63)
ANION GAP: 9 (ref 5–15)
AST: 21 U/L (ref 15–41)
BILIRUBIN TOTAL: 0.7 mg/dL (ref 0.3–1.2)
BUN: 27 mg/dL — AB (ref 6–20)
CALCIUM: 9.1 mg/dL (ref 8.9–10.3)
CO2: 26 mmol/L (ref 22–32)
CREATININE: 1.47 mg/dL — AB (ref 0.61–1.24)
Chloride: 103 mmol/L (ref 101–111)
GFR calc Af Amer: 48 mL/min — ABNORMAL LOW (ref >60)
GFR calc non Af Amer: 42 mL/min — ABNORMAL LOW (ref >60)
GLUCOSE: 112 mg/dL — AB (ref 65–99)
Potassium: 3 mmol/L — ABNORMAL LOW (ref 3.5–5.1)
Sodium: 138 mmol/L (ref 135–145)
Total Protein: 6.6 g/dL (ref 6.5–8.1)

## 2018-04-18 LAB — LACTATE DEHYDROGENASE: LDH: 104 U/L (ref 98–192)

## 2018-04-18 NOTE — Assessment & Plan Note (Signed)
1.  Low-grade NHL/lymphoplasmacytic lymphoma: - Bone marrow aspiration and biopsy on 05/06/2016 consistent with low-grade NHL with plasmacytoid differentiation -Treated with bendamustine and rituximab from 08/17/2016 through 02/02/2017, total of 6 cycles, patient refused maintenance rituximab - Does not have any B symptoms, excellent performance status for his age.  Does not have any recurrent infections. - Recently put on tapering doses of prednisone for his right hip pain after a steroid shot did not work.  Hence he has a mild elevation in white count today at 14.5 with 87% neutrophils. -I went over the blood work with him today.  LDH is normal.  Last SPEP was in November 2018 which showed 0.2 g of monoclonal protein.  And SPEP from today is pending. -I have asked him to come back in 6 months for follow-up with blood work drawn 1 week prior to his next visit.

## 2018-04-18 NOTE — Patient Instructions (Signed)
[place] at [facility]  Discharge Instructions:  You were seen by our oncologist today. Keep next scheduled appointments as directed.  _______________________________________________________________  Thank you for choosing [place] at [facility] to provide your oncology and hematology care.  To afford each patient quality time with our providers, please arrive at least 15 minutes before your scheduled appointment.  You need to re-schedule your appointment if you arrive 10 or more minutes late.  We strive to give you quality time with our providers, and arriving late affects you and other patients whose appointments are after yours.  Also, if you no show three or more times for appointments you may be dismissed from the clinic.  Again, thank you for choosing [facility] hope is that these requests will allow you access to exceptional care and in a timely manner. _______________________________________________________________  If you have questions after your visit, please contact our office at [phone] between the hours of 8:30 a.m. and 5:00 p.m. Voicemails left after 4:30 p.m. will not be returned until the following business day. _______________________________________________________________  For prescription refill requests, have your pharmacy contact our office. _______________________________________________________________  Recommendations made by the consultant and any test results will be sent to your referring physician. _______________________________________________________________

## 2018-04-18 NOTE — Progress Notes (Signed)
[place], [place] [address]   CLINIC:  Medical Oncology/Hematology  PCP:  Octavio Graves, DO [address] [place] [address] [phone]   REASON FOR VISIT:  Follow-up for low-grade non-Hodgkin's lymphoma.  CURRENT THERAPY: Observation.  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 The pattern is somewhat nonspecific but the findings are consistent with non-Hodgkin B-cell lymphoma.      05/10/2016 Pathology Results    Bone Marrow, Aspirate,Biopsy, and Clot, right ilium - HYPERCELLULAR BONE MARROW WITH EXTENSIVE INVOLVEMENT BY NON-HODGKIN'S B-CELL LYMPHOMA.      05/13/2016 PET scan    Splenomegaly and mild diffuse increase metabolic activity in the spleen consistent with lymphomatous involvement. Diffuse osseous uptake likely due to diffuse lymphoma. Recommend correlation with any marrow stimulating drugs or rebound phenomenon.      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 Adjacent small amount of fluid has decreased since prior examination with interval clearing of previously noted small amount of extraluminal air.      06/03/2016 - 07/23/2016 Antibody Plan    Single-agent Rituxan      08/17/2016 -  Chemotherapy    The patient had palonosetron (ALOXI) injection 0.25 mg, 0.25 mg, Intravenous,  Once, 4 of 6 cycles  pegfilgrastim (NEULASTA ONPRO KIT) injection 6 mg, 6 mg, Subcutaneous, Once, 4 of 6 cycles  riTUXimab (RITUXAN) 700 mg in sodium chloride 0.9 % 250 mL (2.1875 mg/mL) chemo infusion, 375 mg/m2 = 700 mg, Intravenous,  Once, 4 of 6 cycles  bendamustine (BENDEKA) 175 mg in sodium chloride 0.9 % 50 mL  (3.0702 mg/mL) chemo infusion, 90 mg/m2 = 175 mg, Intravenous,  Once, 4 of 6 cycles  for chemotherapy treatment.        11/25/2016 - 11/28/2016 Hospital Admission    Admit date: 11/25/2016 Admission diagnosis: Febrile neutropenia Additional comments: Gastroenteritis      12/24/2016 - 12/27/2016 Hospital Admission    Admit date: 12/24/2016 Admission diagnosis: Neutropenic fever Additional comments:       03/01/2017 PET scan    IMPRESSION: 1. Significant improvement in splenomegaly compared to the prior exam, previously 1400 cc, currently 580 cc. 2. Resolution of prior diffuse marrow metabolic activity. 3. High activity focus along the anorectal junction, left anterior 1 o'clock position, not present previously. This could be physiologic activity or new tumor, correlate with digital rectal exam. 4. Nonspecific free pelvic fluid in the pelvis with a small mesenteric acute ill-defined collection of fluid in the upper abdomen. The mesenteric fluid is slightly increased in volume compared to the prior chest CT. Cause for these fluid collections is uncertain. 5. No hypermetabolic adenopathy. 6. Coronary, aortic arch, and branch vessel atherosclerotic vascular disease. Aortoiliac atherosclerotic vascular disease. 7. Enlarged prostate gland. 8.  Prominent stool throughout the colon favors constipation.         INTERVAL HISTORY:  Mr. Keil 84 y.o. male returns for follow-up of his lymphoma.  He denies any fevers, night sweats or weight loss in the last 6 months.  His appetite and energy levels are rated at 75%.  He denies any recent infections or hospitalizations in the last 6  months.  About a week ago, he woke up with the right hip pain.  Steroid shot was given which did not help.  He was put on tapering doses of prednisone which is helping.  Otherwise he is able to do all his activities.  REVIEW OF SYSTEMS:  Review of Systems  Musculoskeletal: Positive for arthralgias.  All other  systems reviewed and are negative.    PAST MEDICAL/SURGICAL HISTORY:  Past Medical History:  Diagnosis Date  . Anemia 04/22/2016  . Diverticulosis    pt reports recent admission for diverticulosis perforation this past week  . 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 Pancytopenia ([facility]) 04/22/2016   Past Surgical History:  Procedure Laterality Date  . CATARACT EXTRACTION Bilateral   . TONSILLECTOMY       SOCIAL HISTORY:  Social History   Socioeconomic History  . Marital status: Married    Spouse name: Not on file  . Number of children: Not on file  . Years of education: Not on file  . Highest education level: Not on file  Occupational History  . Not on file  Social Needs  . Financial resource strain: Not on file  . Food insecurity:    Worry: Not on file    Inability: Not on file  . Transportation needs:    Medical: Not on file    Non-medical: Not on file  Tobacco Use  . Smoking status: Former Smoker    Last attempt to quit: 09/15/1962    Years since quitting: 55.6  . Smokeless tobacco: Never Used  Substance and Sexual Activity  . Alcohol use: Yes    Comment: OCCASSIONAL  . Drug use: No  . Sexual activity: Not on file    Comment: married  Lifestyle  . Physical activity:    Days per week: Not on file    Minutes per session: Not on file  . Stress: Not on file  Relationships  . Social connections:    Talks on phone: Not on file    Gets together: Not on file    Attends religious service: Not on file    Active member of club or organization: Not on file    Attends meetings of clubs or organizations: Not on file    Relationship status: Not on file  . Intimate partner violence:    Fear of current or ex partner: Not on file    Emotionally abused: Not on file    Physically abused: Not on file    Forced sexual activity: Not on file  Other Topics Concern  . Not on file  Social History Narrative  .  Not on file    FAMILY HISTORY:  No family history on file.  CURRENT MEDICATIONS:  Outpatient Encounter Medications as of 04/18/2018  Medication Sig  . acetaminophen (TYLENOL) 500 MG tablet Take 500 mg by mouth every 6 (six) hours as needed for mild pain.  . B Complex-C (B-COMPLEX WITH VITAMIN C) tablet Take 1 tablet by mouth daily.  . cholecalciferol (VITAMIN D) 1000 units tablet Take 1,000 Units by mouth daily.  . cyanocobalamin 100 MCG tablet Take by mouth daily. Pt reports unsure of dosage, takes once daily  . Multiple Vitamin (MULTIVITAMIN WITH MINERALS) TABS tablet Take 1 tablet by mouth daily.  . tamsulosin (FLOMAX) 0.4 MG CAPS capsule Take 0.8 mg by mouth daily after supper.    No facility-administered encounter medications on file as of 04/18/2018.  ALLERGIES:  No Known Allergies   PHYSICAL EXAM:  ECOG Performance status: 1  I have reviewed his vitals.  Blood pressure is 145/73.  Pulse is 104.  Respiratory rate is 16.  Temperature 98.3.  Saturation 100%. Physical Exam HEENT: No thrush or mucositis. Chest: Bilateral clear to auscultation. CVS: S1-S2 regular rate and rhythm. No palpable neck, supraclavicular, axillary or inguinal adenopathy. Abdomen: No palpable hepatospleno megaly. Extremities: No edema or cyanosis.  LABORATORY DATA:  I have reviewed the labs as listed.  CBC    Component Value Date/Time   WBC 14.5 (H) 04/18/2018 1331   RBC 4.35 04/18/2018 1331   HGB 13.1 04/18/2018 1331   HCT 41.0 04/18/2018 1331   PLT 192 04/18/2018 1331   MCV 94.3 04/18/2018 1331   MCH 30.1 04/18/2018 1331   MCHC 32.0 04/18/2018 1331   RDW 14.8 04/18/2018 1331   LYMPHSABS 1.0 04/18/2018 1331   MONOABS 0.9 04/18/2018 1331   EOSABS 0.1 04/18/2018 1331   BASOSABS 0.0 04/18/2018 1331   CMP Latest Ref Rng & Units 04/18/2018 11/29/2017 09/29/2017  Glucose 65 - 99 mg/dL 112(H) 102(H) 98  BUN 6 - 20 mg/dL 27(H) 16 18  Creatinine 0.61 - 1.24 mg/dL 1.47(H) 1.40(H) 1.36(H)  Sodium  135 - 145 mmol/L 138 136 135  Potassium 3.5 - 5.1 mmol/L 3.0(L) 4.5 4.5  Chloride 101 - 111 mmol/L 103 101 103  CO2 22 - 32 mmol/L '26 27 29  ' Calcium 8.9 - 10.3 mg/dL 9.1 9.7 9.4  Total Protein 6.5 - 8.1 g/dL 6.6 6.1(L) 6.4(L)  Total Bilirubin 0.3 - 1.2 mg/dL 0.7 0.6 0.6  Alkaline Phos 38 - 126 U/L 71 72 65  AST 15 - 41 U/L '21 20 22  ' ALT 17 - 63 U/L '28 18 18      ' ASSESSMENT & PLAN:   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  Low-grade NHL/lymphoplasmacytic lymphoma: - Bone marrow aspiration and biopsy on 05/06/2016 consistent with low-grade NHL with plasmacytoid differentiation -Treated with bendamustine and rituximab from 08/17/2016 through 02/02/2017, total of 6 cycles, patient refused maintenance rituximab - Does not have any B symptoms, excellent performance status for his age.  Does not have any recurrent infections. - Recently put on tapering doses of prednisone for his right hip pain after a steroid shot did not work.  Hence he has a mild elevation in white count today at 14.5 with 87% neutrophils. -I went over the blood work with him today.  LDH is normal.  Last SPEP was in November 2018 which showed 0.2 g of monoclonal protein.  And SPEP from today is pending. -I have asked him to come back in 6 months for follow-up with blood work drawn 1 week prior to his next visit.      Orders placed this encounter:  Orders Placed This Encounter  Procedures  . Protein electrophoresis, serum  . Comprehensive metabolic panel  . CBC with Differential  . Lactate dehydrogenase      Derek Jack, MD [place] [phone]

## 2018-04-19 LAB — BETA 2 MICROGLOBULIN, SERUM: Beta-2 Microglobulin: 2.4 mg/L (ref 0.6–2.4)

## 2018-04-19 LAB — KAPPA/LAMBDA LIGHT CHAINS
Kappa free light chain: 25.8 mg/L — ABNORMAL HIGH (ref 3.3–19.4)
Kappa, lambda light chain ratio: 1.55 (ref 0.26–1.65)
LAMDA FREE LIGHT CHAINS: 16.6 mg/L (ref 5.7–26.3)

## 2018-04-19 LAB — IGG, IGA, IGM
IGA: 129 mg/dL (ref 61–437)
IGM (IMMUNOGLOBULIN M), SRM: 191 mg/dL — AB (ref 15–143)
IgG (Immunoglobin G), Serum: 615 mg/dL — ABNORMAL LOW (ref 700–1600)

## 2018-04-20 LAB — PROTEIN ELECTROPHORESIS, SERUM
A/G RATIO SPE: 1.6 (ref 0.7–1.7)
ALBUMIN ELP: 3.6 g/dL (ref 2.9–4.4)
Alpha-1-Globulin: 0.2 g/dL (ref 0.0–0.4)
Alpha-2-Globulin: 0.6 g/dL (ref 0.4–1.0)
BETA GLOBULIN: 0.8 g/dL (ref 0.7–1.3)
Gamma Globulin: 0.7 g/dL (ref 0.4–1.8)
Globulin, Total: 2.3 g/dL (ref 2.2–3.9)
TOTAL PROTEIN ELP: 5.9 g/dL — AB (ref 6.0–8.5)

## 2018-04-23 DIAGNOSIS — E860 Dehydration: Secondary | ICD-10-CM | POA: Diagnosis not present

## 2018-04-23 DIAGNOSIS — J189 Pneumonia, unspecified organism: Secondary | ICD-10-CM | POA: Diagnosis not present

## 2018-04-23 DIAGNOSIS — R531 Weakness: Secondary | ICD-10-CM | POA: Diagnosis not present

## 2018-04-23 DIAGNOSIS — J069 Acute upper respiratory infection, unspecified: Secondary | ICD-10-CM | POA: Diagnosis not present

## 2018-04-23 DIAGNOSIS — Z87891 Personal history of nicotine dependence: Secondary | ICD-10-CM | POA: Diagnosis not present

## 2018-04-23 DIAGNOSIS — J181 Lobar pneumonia, unspecified organism: Secondary | ICD-10-CM | POA: Diagnosis not present

## 2018-04-23 DIAGNOSIS — D72829 Elevated white blood cell count, unspecified: Secondary | ICD-10-CM | POA: Diagnosis not present

## 2018-04-23 DIAGNOSIS — R05 Cough: Secondary | ICD-10-CM | POA: Diagnosis not present

## 2018-04-23 DIAGNOSIS — Z23 Encounter for immunization: Secondary | ICD-10-CM | POA: Diagnosis not present

## 2018-04-24 DIAGNOSIS — E860 Dehydration: Secondary | ICD-10-CM | POA: Diagnosis not present

## 2018-04-24 DIAGNOSIS — J189 Pneumonia, unspecified organism: Secondary | ICD-10-CM | POA: Diagnosis present

## 2018-05-01 DIAGNOSIS — J189 Pneumonia, unspecified organism: Secondary | ICD-10-CM | POA: Diagnosis not present

## 2018-05-30 DIAGNOSIS — M1651 Unilateral post-traumatic osteoarthritis, right hip: Secondary | ICD-10-CM | POA: Diagnosis not present

## 2018-08-21 DIAGNOSIS — Z23 Encounter for immunization: Secondary | ICD-10-CM | POA: Diagnosis not present

## 2018-10-18 ENCOUNTER — Inpatient Hospital Stay (HOSPITAL_COMMUNITY): Payer: Medicare Other | Attending: Hematology

## 2018-10-18 DIAGNOSIS — C8300 Small cell B-cell lymphoma, unspecified site: Secondary | ICD-10-CM | POA: Insufficient documentation

## 2018-10-18 DIAGNOSIS — C880 Waldenstrom macroglobulinemia: Secondary | ICD-10-CM

## 2018-10-18 LAB — COMPREHENSIVE METABOLIC PANEL
ALT: 23 U/L (ref 0–44)
AST: 24 U/L (ref 15–41)
Albumin: 4.2 g/dL (ref 3.5–5.0)
Alkaline Phosphatase: 64 U/L (ref 38–126)
Anion gap: 6 (ref 5–15)
BILIRUBIN TOTAL: 0.6 mg/dL (ref 0.3–1.2)
BUN: 19 mg/dL (ref 8–23)
CO2: 25 mmol/L (ref 22–32)
CREATININE: 1.46 mg/dL — AB (ref 0.61–1.24)
Calcium: 9.2 mg/dL (ref 8.9–10.3)
Chloride: 106 mmol/L (ref 98–111)
GFR calc Af Amer: 50 mL/min — ABNORMAL LOW (ref >60)
GFR, EST NON AFRICAN AMERICAN: 43 mL/min — AB (ref >60)
Glucose, Bld: 102 mg/dL — ABNORMAL HIGH (ref 70–99)
POTASSIUM: 4.3 mmol/L (ref 3.5–5.1)
Sodium: 137 mmol/L (ref 135–145)
TOTAL PROTEIN: 6.6 g/dL (ref 6.5–8.1)

## 2018-10-18 LAB — CBC WITH DIFFERENTIAL/PLATELET
Abs Immature Granulocytes: 0.01 10*3/uL (ref 0.00–0.07)
BASOS PCT: 0 %
Basophils Absolute: 0 10*3/uL (ref 0.0–0.1)
EOS PCT: 2 %
Eosinophils Absolute: 0.1 10*3/uL (ref 0.0–0.5)
HCT: 41.4 % (ref 39.0–52.0)
Hemoglobin: 13.2 g/dL (ref 13.0–17.0)
Immature Granulocytes: 0 %
Lymphocytes Relative: 31 %
Lymphs Abs: 1.5 10*3/uL (ref 0.7–4.0)
MCH: 29.7 pg (ref 26.0–34.0)
MCHC: 31.9 g/dL (ref 30.0–36.0)
MCV: 93.2 fL (ref 80.0–100.0)
MONO ABS: 0.5 10*3/uL (ref 0.1–1.0)
Monocytes Relative: 10 %
Neutro Abs: 2.8 10*3/uL (ref 1.7–7.7)
Neutrophils Relative %: 57 %
Platelets: 84 10*3/uL — ABNORMAL LOW (ref 150–400)
RBC: 4.44 MIL/uL (ref 4.22–5.81)
RDW: 14.1 % (ref 11.5–15.5)
WBC: 5 10*3/uL (ref 4.0–10.5)
nRBC: 0 % (ref 0.0–0.2)

## 2018-10-18 LAB — LACTATE DEHYDROGENASE: LDH: 104 U/L (ref 98–192)

## 2018-10-19 LAB — PROTEIN ELECTROPHORESIS, SERUM
A/G Ratio: 2.3 — ABNORMAL HIGH (ref 0.7–1.7)
ALBUMIN ELP: 4.2 g/dL (ref 2.9–4.4)
Alpha-1-Globulin: 0.1 g/dL (ref 0.0–0.4)
Alpha-2-Globulin: 0.4 g/dL (ref 0.4–1.0)
Beta Globulin: 0.7 g/dL (ref 0.7–1.3)
GAMMA GLOBULIN: 0.5 g/dL (ref 0.4–1.8)
Globulin, Total: 1.8 g/dL — ABNORMAL LOW (ref 2.2–3.9)
Total Protein ELP: 6 g/dL (ref 6.0–8.5)

## 2018-10-24 ENCOUNTER — Inpatient Hospital Stay (HOSPITAL_COMMUNITY): Payer: Medicare Other | Attending: Hematology | Admitting: Hematology

## 2018-10-24 ENCOUNTER — Other Ambulatory Visit: Payer: Self-pay

## 2018-10-24 ENCOUNTER — Encounter (HOSPITAL_COMMUNITY): Payer: Self-pay | Admitting: Hematology

## 2018-10-24 DIAGNOSIS — Z87891 Personal history of nicotine dependence: Secondary | ICD-10-CM | POA: Diagnosis not present

## 2018-10-24 DIAGNOSIS — C8300 Small cell B-cell lymphoma, unspecified site: Secondary | ICD-10-CM | POA: Diagnosis not present

## 2018-10-24 NOTE — Patient Instructions (Addendum)
[place] at [facility] Discharge Instructions   Follow up in 2 months just for labs call us the next day to see how they are.  Follow up in 6 months with labs    Thank you for choosing [place] at [facility] to provide your oncology and hematology care.  To afford each patient quality time with our provider, please arrive at least 15 minutes before your scheduled appointment time.   If you have a lab appointment with the [facility] please come in thru the  Main Entrance and check in at the main information desk  You need to re-schedule your appointment should you arrive 10 or more minutes late.  We strive to give you quality time with our providers, and arriving late affects you and other patients whose appointments are after yours.  Also, if you no show three or more times for appointments you may be dismissed from the clinic at the providers discretion.     Again, thank you for choosing [facility]  Our hope is that these requests will decrease the amount of time that you wait before being seen by our physicians.       _____________________________________________________________  Should you have questions after your visit to [facility], please contact our office at [phone] between the hours of 8:00 a.m. and 4:30 p.m.  Voicemails left after 4:00 p.m. will not be returned until the following business day.  For prescription refill requests, have your pharmacy contact our office and allow 72 hours.    Cancer Center Support Programs:   > Cancer Support Group  2nd Tuesday of the month 1pm-2pm, Journey Room

## 2018-10-24 NOTE — Assessment & Plan Note (Signed)
1.  Low-grade NHL/lymphoplasmacytic lymphoma: - Bone marrow aspiration and biopsy on 05/06/2016 consistent with low-grade NHL with plasmacytoid differentiation -Treated with bendamustine and rituximab from 08/17/2016 through 02/02/2017, total of 6 cycles, patient refused maintenance rituximab -In the last 6 months, he denies any fevers, night sweats or weight loss.  He denies any infections or hospitalizations. - He denies any easy bruising or bleeding. -We talked about his blood counts.  He had a thrombocytopenia with a platelet count of 84.  At last visit his platelet count was normal.  He denies taking any antibiotics or new medications in the past few months. - I would recommend him to check his platelet count in 2 months.  I will see him back in 6 months for follow-up.  If they continue to get worse, we will do further work-up in the form of scans.

## 2018-10-24 NOTE — Progress Notes (Signed)
[place], [place] [address]   CLINIC:  Medical Oncology/Hematology  PCP:  Octavio Graves, DO [address] [place] [address] [phone]   REASON FOR VISIT: Follow-up for low-grade non-Hodgkin's lymphoma.   CURRENT THERAPY: Observation  BRIEF ONCOLOGIC HISTORY:    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[place])   05/06/2016 Procedure    Bone marrow aspiration and biopsy    05/10/2016 Pathology Results    Bone Marrow Flow Cytometry - MONOCLONAL B-CELL POPULATION IDENTIFIED. The pattern is somewhat nonspecific but the findings are consistent with non-Hodgkin B-cell lymphoma.    05/10/2016 Pathology Results    Bone Marrow, Aspirate,Biopsy, and Clot, right ilium - HYPERCELLULAR BONE MARROW WITH EXTENSIVE INVOLVEMENT BY NON-HODGKIN'S B-CELL LYMPHOMA.    05/13/2016 PET scan    Splenomegaly and mild diffuse increase metabolic activity in the spleen consistent with lymphomatous involvement. Diffuse osseous uptake likely due to diffuse lymphoma. Recommend correlation with any marrow stimulating drugs or rebound phenomenon.    05/21/2016 Initial Diagnosis    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[place])    06/01/2016 Imaging    Circumferential diffuse thickening of the mid to distal sigmoid colon. Adjacent small amount of fluid has decreased since prior examination with interval clearing of previously noted small amount of extraluminal air.    06/03/2016 - 07/23/2016 Antibody Plan    Single-agent Rituxan    08/17/2016 -  Chemotherapy    The patient had palonosetron (ALOXI) injection 0.25 mg, 0.25 mg, Intravenous,  Once, 4 of 6 cycles  pegfilgrastim (NEULASTA ONPRO KIT) injection 6 mg, 6 mg, Subcutaneous, Once, 4 of 6 cycles  riTUXimab (RITUXAN) 700 mg in sodium chloride 0.9 % 250 mL (2.1875 mg/mL) chemo infusion, 375 mg/m2 = 700 mg, Intravenous,  Once, 4 of 6 cycles  bendamustine (BENDEKA) 175 mg in sodium chloride 0.9 % 50 mL (3.0702 mg/mL) chemo  infusion, 90 mg/m2 = 175 mg, Intravenous,  Once, 4 of 6 cycles  for chemotherapy treatment.      11/25/2016 - 11/28/2016 Hospital Admission    Admit date: 11/25/2016 Admission diagnosis: Febrile neutropenia Additional comments: Gastroenteritis    12/24/2016 - 12/27/2016 Hospital Admission    Admit date: 12/24/2016 Admission diagnosis: Neutropenic fever Additional comments:     03/01/2017 PET scan    IMPRESSION: 1. Significant improvement in splenomegaly compared to the prior exam, previously 1400 cc, currently 580 cc. 2. Resolution of prior diffuse marrow metabolic activity. 3. High activity focus along the anorectal junction, left anterior 1 o'clock position, not present previously. This could be physiologic activity or new tumor, correlate with digital rectal exam. 4. Nonspecific free pelvic fluid in the pelvis with a small mesenteric acute ill-defined collection of fluid in the upper abdomen. The mesenteric fluid is slightly increased in volume compared to the prior chest CT. Cause for these fluid collections is uncertain. 5. No hypermetabolic adenopathy. 6. Coronary, aortic arch, and branch vessel atherosclerotic vascular disease. Aortoiliac atherosclerotic vascular disease. 7. Enlarged prostate gland. 8.  Prominent stool throughout the colon favors constipation.      INTERVAL HISTORY:  Paul Keith 84 y.o. male returns for routine follow-up for low-grade non-Hodgkin's lymphoma. He is here today with his wife. He is doing well with no complains today. He denies any easy bruising or bleeding. Denies any fevers or recent infections. Denies any night sweats, chills, or weight loss. Denies any new medications added. Denies any new pains. He reports his appetite at 100% and his energy level at 75%. He  is maintaining his weight well. He lives at home with his wife and performs all his own ADLs. He states he is not as active as he used to be. He used to walk daily but he has stopped doing  that.      REVIEW OF SYSTEMS:  Review of Systems  All other systems reviewed and are negative.    PAST MEDICAL/SURGICAL HISTORY:  Past Medical History:  Diagnosis Date  . Anemia 04/22/2016  . Diverticulosis    pt reports recent admission for diverticulosis perforation this past week  . Lymphoplasmacytoid lymphoma, CLL ([place]) 05/07/2016  . 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[place]) 05/21/2016  . Pancytopenia ([place]) 04/22/2016   Past Surgical History:  Procedure Laterality Date  . CATARACT EXTRACTION Bilateral   . TONSILLECTOMY       SOCIAL HISTORY:  Social History   Socioeconomic History  . Marital status: Married    Spouse name: Not on file  . Number of children: Not on file  . Years of education: Not on file  . Highest education level: Not on file  Occupational History  . Not on file  Social Needs  . Financial resource strain: Not on file  . Food insecurity:    Worry: Not on file    Inability: Not on file  . Transportation needs:    Medical: Not on file    Non-medical: Not on file  Tobacco Use  . Smoking status: Former Smoker    Last attempt to quit: 09/15/1962    Years since quitting: 56.1  . Smokeless tobacco: Never Used  Substance and Sexual Activity  . Alcohol use: Yes    Comment: OCCASSIONAL  . Drug use: No  . Sexual activity: Not on file    Comment: married  Lifestyle  . Physical activity:    Days per week: Not on file    Minutes per session: Not on file  . Stress: Not on file  Relationships  . Social connections:    Talks on phone: Not on file    Gets together: Not on file    Attends religious service: Not on file    Active member of club or organization: Not on file    Attends meetings of clubs or organizations: Not on file    Relationship status: Not on file  . Intimate partner violence:    Fear of current or ex partner: Not on file    Emotionally abused: Not on file    Physically abused: Not on file    Forced sexual activity: Not on file    Other Topics Concern  . Not on file  Social History Narrative  . Not on file    FAMILY HISTORY:  History reviewed. No pertinent family history.  CURRENT MEDICATIONS:  Outpatient Encounter Medications as of 10/24/2018  Medication Sig  . acetaminophen (TYLENOL) 500 MG tablet Take 500 mg by mouth every 6 (six) hours as needed for mild pain.  . B Complex-C (B-COMPLEX WITH VITAMIN C) tablet Take 1 tablet by mouth daily.  . cholecalciferol (VITAMIN D) 1000 units tablet Take 1,000 Units by mouth daily.  . cyanocobalamin 100 MCG tablet Take by mouth daily. Pt reports unsure of dosage, takes once daily  . Multiple Vitamin (MULTIVITAMIN WITH MINERALS) TABS tablet Take 1 tablet by mouth daily.  . tamsulosin (FLOMAX) 0.4 MG CAPS capsule Take 0.8 mg by mouth daily after supper.    No facility-administered encounter medications on file as of 10/24/2018.     ALLERGIES:  No  Known Allergies   PHYSICAL EXAM:  ECOG Performance status: 1  Vitals:   10/24/18 1029  BP: 133/77  Pulse: (!) 102  Resp: 18  Temp: 97.8 F (36.6 C)  SpO2: 100%   Filed Weights   10/24/18 1029  Weight: 170 lb (77.1 kg)    Physical Exam  Constitutional: He is oriented to person, place, and time. He appears well-developed and well-nourished.  Musculoskeletal: Normal range of motion.  Neurological: He is alert and oriented to person, place, and time.  Skin: Skin is warm and dry.  Psychiatric: He has a normal mood and affect. His behavior is normal. Judgment and thought content normal.     LABORATORY DATA:  I have reviewed the labs as listed.  CBC    Component Value Date/Time   WBC 5.0 10/18/2018 1100   RBC 4.44 10/18/2018 1100   HGB 13.2 10/18/2018 1100   HCT 41.4 10/18/2018 1100   PLT 84 (L) 10/18/2018 1100   MCV 93.2 10/18/2018 1100   MCH 29.7 10/18/2018 1100   MCHC 31.9 10/18/2018 1100   RDW 14.1 10/18/2018 1100   LYMPHSABS 1.5 10/18/2018 1100   MONOABS 0.5 10/18/2018 1100   EOSABS 0.1  10/18/2018 1100   BASOSABS 0.0 10/18/2018 1100   CMP Latest Ref Rng & Units 10/18/2018 04/18/2018 11/29/2017  Glucose 70 - 99 mg/dL 102(H) 112(H) 102(H)  BUN 8 - 23 mg/dL 19 27(H) 16  Creatinine 0.61 - 1.24 mg/dL 1.46(H) 1.47(H) 1.40(H)  Sodium 135 - 145 mmol/L 137 138 136  Potassium 3.5 - 5.1 mmol/L 4.3 3.0(L) 4.5  Chloride 98 - 111 mmol/L 106 103 101  CO2 22 - 32 mmol/L _0 Calcium 8.9 - 10.3 mg/dL 9.2 9.1 9.7  Total Protein 6.5 - 8.1 g/dL 6.6 6.6 6.1(L)  Total Bilirubin 0.3 - 1.2 mg/dL 0.6 0.7 0.6  Alkaline Phos 38 - 126 U/L 64 71 72  AST 15 - 41 U/L _1 ALT 0 - 44 U/L _2 I have reviewed Francene Finders, NP's note and agree with the documentation.  I personally performed a face-to-face visit, made revisions and my assessment and plan is as follows.      ASSESSMENT & PLAN:   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[place]) 1.  Low-grade NHL/lymphoplasmacytic lymphoma: - Bone marrow aspiration and biopsy on 05/06/2016 consistent with low-grade NHL with plasmacytoid differentiation -Treated with bendamustine and rituximab from 08/17/2016 through 02/02/2017, total of 6 cycles, patient refused maintenance rituximab -In the last 6 months, he denies any fevers, night sweats or weight loss.  He denies any infections or hospitalizations. - He denies any easy bruising or bleeding. -We talked about his blood counts.  He had a thrombocytopenia with a platelet count of 84.  At last visit his platelet count was normal.  He denies taking any antibiotics or new medications in the past few months. - I would recommend him to check his platelet count in 2 months.  I will see him back in 6 months for follow-up.  If they continue to get worse, we will do further work-up in the form of scans.      Orders placed this encounter:  Orders Placed This Encounter  Procedures  . Lactate dehydrogenase  . Protein electrophoresis, serum  . Kappa/lambda light chains  . CBC with  Differential/Platelet  . Comprehensive metabolic panel  . IgG, IgA, IgM  . CBC with Differential/Platelet  . Comprehensive metabolic panel  Derek Jack, MD [place] [phone]

## 2018-10-25 ENCOUNTER — Ambulatory Visit (HOSPITAL_COMMUNITY): Payer: Medicare Other | Admitting: Hematology

## 2018-11-29 DIAGNOSIS — H524 Presbyopia: Secondary | ICD-10-CM | POA: Diagnosis not present

## 2018-12-18 DIAGNOSIS — R51 Headache: Secondary | ICD-10-CM | POA: Diagnosis not present

## 2018-12-25 ENCOUNTER — Inpatient Hospital Stay (HOSPITAL_COMMUNITY): Payer: Medicare Other | Attending: Hematology

## 2018-12-25 DIAGNOSIS — C8300 Small cell B-cell lymphoma, unspecified site: Secondary | ICD-10-CM | POA: Insufficient documentation

## 2018-12-25 LAB — CBC WITH DIFFERENTIAL/PLATELET
Abs Immature Granulocytes: 0.01 10*3/uL (ref 0.00–0.07)
Basophils Absolute: 0 10*3/uL (ref 0.0–0.1)
Basophils Relative: 0 %
Eosinophils Absolute: 0.1 10*3/uL (ref 0.0–0.5)
Eosinophils Relative: 2 %
HCT: 42.3 % (ref 39.0–52.0)
HEMOGLOBIN: 13.5 g/dL (ref 13.0–17.0)
Immature Granulocytes: 0 %
LYMPHS PCT: 24 %
Lymphs Abs: 1.1 10*3/uL (ref 0.7–4.0)
MCH: 30.1 pg (ref 26.0–34.0)
MCHC: 31.9 g/dL (ref 30.0–36.0)
MCV: 94.2 fL (ref 80.0–100.0)
Monocytes Absolute: 0.5 10*3/uL (ref 0.1–1.0)
Monocytes Relative: 11 %
Neutro Abs: 2.8 10*3/uL (ref 1.7–7.7)
Neutrophils Relative %: 63 %
Platelets: 88 10*3/uL — ABNORMAL LOW (ref 150–400)
RBC: 4.49 MIL/uL (ref 4.22–5.81)
RDW: 14.6 % (ref 11.5–15.5)
WBC: 4.5 10*3/uL (ref 4.0–10.5)
nRBC: 0 % (ref 0.0–0.2)

## 2018-12-25 LAB — COMPREHENSIVE METABOLIC PANEL
ALT: 19 U/L (ref 0–44)
AST: 20 U/L (ref 15–41)
Albumin: 4.1 g/dL (ref 3.5–5.0)
Alkaline Phosphatase: 65 U/L (ref 38–126)
Anion gap: 7 (ref 5–15)
BUN: 19 mg/dL (ref 8–23)
CO2: 27 mmol/L (ref 22–32)
Calcium: 8.9 mg/dL (ref 8.9–10.3)
Chloride: 105 mmol/L (ref 98–111)
Creatinine, Ser: 1.51 mg/dL — ABNORMAL HIGH (ref 0.61–1.24)
GFR calc Af Amer: 48 mL/min — ABNORMAL LOW (ref >60)
GFR calc non Af Amer: 41 mL/min — ABNORMAL LOW (ref >60)
GLUCOSE: 96 mg/dL (ref 70–99)
Potassium: 4.1 mmol/L (ref 3.5–5.1)
Sodium: 139 mmol/L (ref 135–145)
Total Bilirubin: 0.6 mg/dL (ref 0.3–1.2)
Total Protein: 6.6 g/dL (ref 6.5–8.1)

## 2019-04-25 ENCOUNTER — Other Ambulatory Visit: Payer: Self-pay

## 2019-04-25 ENCOUNTER — Inpatient Hospital Stay (HOSPITAL_COMMUNITY): Payer: Medicare Other | Attending: Hematology

## 2019-04-25 DIAGNOSIS — N189 Chronic kidney disease, unspecified: Secondary | ICD-10-CM | POA: Diagnosis not present

## 2019-04-25 DIAGNOSIS — Z87891 Personal history of nicotine dependence: Secondary | ICD-10-CM | POA: Diagnosis not present

## 2019-04-25 DIAGNOSIS — D696 Thrombocytopenia, unspecified: Secondary | ICD-10-CM | POA: Insufficient documentation

## 2019-04-25 DIAGNOSIS — C880 Waldenstrom macroglobulinemia: Secondary | ICD-10-CM | POA: Diagnosis not present

## 2019-04-25 DIAGNOSIS — R42 Dizziness and giddiness: Secondary | ICD-10-CM | POA: Diagnosis not present

## 2019-04-25 DIAGNOSIS — C8300 Small cell B-cell lymphoma, unspecified site: Secondary | ICD-10-CM | POA: Diagnosis not present

## 2019-04-25 LAB — CBC WITH DIFFERENTIAL/PLATELET
Abs Immature Granulocytes: 0.01 10*3/uL (ref 0.00–0.07)
Basophils Absolute: 0 10*3/uL (ref 0.0–0.1)
Basophils Relative: 0 %
Eosinophils Absolute: 0.1 10*3/uL (ref 0.0–0.5)
Eosinophils Relative: 2 %
HCT: 40.7 % (ref 39.0–52.0)
Hemoglobin: 13.1 g/dL (ref 13.0–17.0)
Immature Granulocytes: 0 %
Lymphocytes Relative: 29 %
Lymphs Abs: 1.3 10*3/uL (ref 0.7–4.0)
MCH: 30.8 pg (ref 26.0–34.0)
MCHC: 32.2 g/dL (ref 30.0–36.0)
MCV: 95.8 fL (ref 80.0–100.0)
Monocytes Absolute: 0.5 10*3/uL (ref 0.1–1.0)
Monocytes Relative: 11 %
Neutro Abs: 2.6 10*3/uL (ref 1.7–7.7)
Neutrophils Relative %: 58 %
Platelets: 96 10*3/uL — ABNORMAL LOW (ref 150–400)
RBC: 4.25 MIL/uL (ref 4.22–5.81)
RDW: 14.2 % (ref 11.5–15.5)
WBC: 4.5 10*3/uL (ref 4.0–10.5)
nRBC: 0 % (ref 0.0–0.2)

## 2019-04-25 LAB — COMPREHENSIVE METABOLIC PANEL
ALT: 16 U/L (ref 0–44)
AST: 21 U/L (ref 15–41)
Albumin: 4.2 g/dL (ref 3.5–5.0)
Alkaline Phosphatase: 65 U/L (ref 38–126)
Anion gap: 10 (ref 5–15)
BUN: 22 mg/dL (ref 8–23)
CO2: 24 mmol/L (ref 22–32)
Calcium: 9.3 mg/dL (ref 8.9–10.3)
Chloride: 105 mmol/L (ref 98–111)
Creatinine, Ser: 1.59 mg/dL — ABNORMAL HIGH (ref 0.61–1.24)
GFR calc Af Amer: 45 mL/min — ABNORMAL LOW (ref >60)
GFR calc non Af Amer: 39 mL/min — ABNORMAL LOW (ref >60)
Glucose, Bld: 92 mg/dL (ref 70–99)
Potassium: 4.7 mmol/L (ref 3.5–5.1)
Sodium: 139 mmol/L (ref 135–145)
Total Bilirubin: 0.6 mg/dL (ref 0.3–1.2)
Total Protein: 6.7 g/dL (ref 6.5–8.1)

## 2019-04-25 LAB — LACTATE DEHYDROGENASE: LDH: 99 U/L (ref 98–192)

## 2019-04-26 LAB — PROTEIN ELECTROPHORESIS, SERUM
A/G Ratio: 1.8 — ABNORMAL HIGH (ref 0.7–1.7)
Albumin ELP: 3.9 g/dL (ref 2.9–4.4)
Alpha-1-Globulin: 0.2 g/dL (ref 0.0–0.4)
Alpha-2-Globulin: 0.5 g/dL (ref 0.4–1.0)
Beta Globulin: 0.8 g/dL (ref 0.7–1.3)
Gamma Globulin: 0.7 g/dL (ref 0.4–1.8)
Globulin, Total: 2.2 g/dL (ref 2.2–3.9)
Total Protein ELP: 6.1 g/dL (ref 6.0–8.5)

## 2019-04-26 LAB — KAPPA/LAMBDA LIGHT CHAINS
Kappa free light chain: 28.6 mg/L — ABNORMAL HIGH (ref 3.3–19.4)
Kappa, lambda light chain ratio: 1.31 (ref 0.26–1.65)
Lambda free light chains: 21.9 mg/L (ref 5.7–26.3)

## 2019-04-26 LAB — IGG, IGA, IGM
IgA: 135 mg/dL (ref 61–437)
IgG (Immunoglobin G), Serum: 676 mg/dL (ref 603–1613)
IgM (Immunoglobulin M), Srm: 161 mg/dL — ABNORMAL HIGH (ref 15–143)

## 2019-05-01 ENCOUNTER — Other Ambulatory Visit: Payer: Self-pay

## 2019-05-02 ENCOUNTER — Encounter (HOSPITAL_COMMUNITY): Payer: Self-pay | Admitting: Hematology

## 2019-05-02 ENCOUNTER — Inpatient Hospital Stay (HOSPITAL_COMMUNITY): Payer: Medicare Other | Admitting: Hematology

## 2019-05-02 VITALS — BP 146/72 | HR 78 | Temp 98.2°F | Resp 16 | Wt 167.9 lb

## 2019-05-02 DIAGNOSIS — N189 Chronic kidney disease, unspecified: Secondary | ICD-10-CM

## 2019-05-02 DIAGNOSIS — C8300 Small cell B-cell lymphoma, unspecified site: Secondary | ICD-10-CM | POA: Diagnosis not present

## 2019-05-02 DIAGNOSIS — C880 Waldenstrom macroglobulinemia: Secondary | ICD-10-CM

## 2019-05-02 DIAGNOSIS — Z87891 Personal history of nicotine dependence: Secondary | ICD-10-CM

## 2019-05-02 DIAGNOSIS — R42 Dizziness and giddiness: Secondary | ICD-10-CM

## 2019-05-02 DIAGNOSIS — D696 Thrombocytopenia, unspecified: Secondary | ICD-10-CM | POA: Diagnosis not present

## 2019-05-02 MED ORDER — MECLIZINE HCL 25 MG PO TABS: 25.0000 mg | ORAL_TABLET | Freq: Three times a day (TID) | ORAL | 1 refills | Status: DC | PRN

## 2019-05-02 NOTE — Assessment & Plan Note (Signed)
1.  Low-grade NHL/lymphoplasmacytic lymphoma: - Presentation with severe weakness. -Bone marrow biopsy on 05/06/2016 consistent with low-grade NHL with plasmacytoid differentiation. - 6 cycles of bendamustine and rituximab from 08/17/2016 through 02/02/2017, patient refused maintenance rituximab. - Denies any fevers, night sweats or weight loss in the past 6 months. -Denies any recurrent infections or hospitalizations.  Denies any bleeding or easy bruising. - Physical examination did not reveal any adenopathy or splenomegaly. -I reviewed his blood work.  SPEP was negative.  Free kappa light chains are slightly elevated at 28.  Ratio is normal. -I have recommended follow-up in 6 months.  2.  Moderate thrombocytopenia: - He has moderate thrombocytopenia ranging from 84-96 from 6 months ago.  Prior to that his platelet count was normal. - LDH was normal.  Etiology unclear at this time.  Immune thrombocytopenia is a consideration although lymphoma should also be considered. -As the platelet count has been stable, I did not recommend any further work-up at this time.  If they get worse, will consider doing a bone marrow biopsy.  3.  CKD: -His creatinine is between 1.4 and 1.6.  -I have recommended avoiding nephrotoxins.  4.  Vertigo: -He does report occasional vertigo.  We have renewed prescription for meclizine.

## 2019-05-02 NOTE — Progress Notes (Signed)
Paul Keith, Paul Keith   CLINIC:  Medical Oncology/Hematology  PCP:  Octavio Graves, DO [address] [place] [address] [phone]   REASON FOR VISIT: Follow-up for low-grade non-Hodgkin's lymphoma.   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 The pattern is somewhat nonspecific but the findings are consistent with non-Hodgkin B-cell lymphoma.   05/10/2016 Pathology Results   Bone Marrow, Aspirate,Biopsy, and Clot, right ilium - HYPERCELLULAR BONE MARROW WITH EXTENSIVE INVOLVEMENT BY NON-HODGKIN'S B-CELL LYMPHOMA.   05/13/2016 PET scan   Splenomegaly and mild diffuse increase metabolic activity in the spleen consistent with lymphomatous involvement. Diffuse osseous uptake likely due to diffuse lymphoma. Recommend correlation with any marrow stimulating drugs or rebound phenomenon.   05/21/2016 Initial Diagnosis   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[employer])   06/01/2016 Imaging   Circumferential diffuse thickening of the mid to distal sigmoid colon. Adjacent small amount of fluid has decreased since prior examination with interval clearing of previously noted small amount of extraluminal air.   06/03/2016 - 07/23/2016 Antibody Plan   Single-agent Rituxan   08/17/2016 -  Chemotherapy   The patient had palonosetron (ALOXI) injection 0.25 mg, 0.25 mg, Intravenous,  Once, 4 of 6 cycles  pegfilgrastim (NEULASTA ONPRO KIT) injection 6 mg, 6 mg, Subcutaneous, Once, 4 of 6 cycles  riTUXimab (RITUXAN) 700 mg in sodium chloride 0.9 % 250 mL (2.1875 mg/mL) chemo infusion, 375 mg/m2 = 700 mg, Intravenous,  Once, 4 of 6 cycles  bendamustine (BENDEKA) 175 mg in sodium chloride 0.9 % 50 mL (3.0702 mg/mL) chemo  infusion, 90 mg/m2 = 175 mg, Intravenous,  Once, 4 of 6 cycles  for chemotherapy treatment.     11/25/2016 - 11/28/2016 Hospital Admission   Admit date: 11/25/2016 Admission diagnosis: Febrile neutropenia Additional comments: Gastroenteritis   12/24/2016 - 12/27/2016 Hospital Admission   Admit date: 12/24/2016 Admission diagnosis: Neutropenic fever Additional comments:    03/01/2017 PET scan   IMPRESSION: 1. Significant improvement in splenomegaly compared to the prior exam, previously 1400 cc, currently 580 cc. 2. Resolution of prior diffuse marrow metabolic activity. 3. High activity focus along the anorectal junction, left anterior 1 o'clock position, not present previously. This could be physiologic activity or new tumor, correlate with digital rectal exam. 4. Nonspecific free pelvic fluid in the pelvis with a small mesenteric acute ill-defined collection of fluid in the upper abdomen. The mesenteric fluid is slightly increased in volume compared to the prior chest CT. Cause for these fluid collections is uncertain. 5. No hypermetabolic adenopathy. 6. Coronary, aortic arch, and branch vessel atherosclerotic vascular disease. Aortoiliac atherosclerotic vascular disease. 7. Enlarged prostate gland. 8.  Prominent stool throughout the colon favors constipation.      INTERVAL HISTORY:  Paul Keith 85 y.o. male returns for follow-up of his lymphoma and thrombocytopenia.  He denies any easy bruising or bleeding.  He denies any fevers, night sweats or weight loss in the last 6 months.  No recurrent infections or hospitalizations.  Denies any ER visits.  He lives at home with his wife and performs all his ADLs and IADLs.  He reports occasional dizziness from his vertigo.  He requests refill for meclizine.  Appetite and energy levels are 100%.   REVIEW OF SYSTEMS:  Review of Systems  Neurological: Positive for dizziness.  All other systems reviewed and are negative.    PAST  MEDICAL/SURGICAL HISTORY:  Past Medical History:  Diagnosis Date  . Anemia 04/22/2016  . Diverticulosis    pt reports recent admission for diverticulosis perforation this past week  . 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 Pancytopenia ([place]) 04/22/2016   Past Surgical History:  Procedure Laterality Date  . CATARACT EXTRACTION Bilateral   . TONSILLECTOMY       SOCIAL HISTORY:  Social History   Socioeconomic History  . Marital status: Married    Spouse name: Not on file  . Number of children: Not on file  . Years of education: Not on file  . Highest education level: Not on file  Occupational History  . Not on file  Social Needs  . Financial resource strain: Not on file  . Food insecurity    Worry: Not on file    Inability: Not on file  . Transportation needs    Medical: Not on file    Non-medical: Not on file  Tobacco Use  . Smoking status: Former Smoker    Quit date: 09/15/1962    Years since quitting: 56.6  . Smokeless tobacco: Never Used  Substance and Sexual Activity  . Alcohol use: Yes    Comment: OCCASSIONAL  . Drug use: No  . Sexual activity: Not on file    Comment: married  Lifestyle  . Physical activity    Days per week: Not on file    Minutes per session: Not on file  . Stress: Not on file  Relationships  . Social Herbalist on phone: Not on file    Gets together: Not on file    Attends religious service: Not on file    Active member of club or organization: Not on file    Attends meetings of clubs or organizations: Not on file    Relationship status: Not on file  . Intimate partner violence    Fear of current or ex partner: Not on file    Emotionally abused: Not on file    Physically abused: Not on file    Forced sexual activity: Not on file  Other Topics Concern  . Not on file  Social History Narrative  . Not on file    FAMILY HISTORY:  History reviewed. No  pertinent family history.  CURRENT MEDICATIONS:  Outpatient Encounter Medications as of 05/02/2019  Medication Sig  . acetaminophen (TYLENOL) 500 MG tablet Take 500 mg by mouth every 6 (six) hours as needed for mild pain.  . B Complex-C (B-COMPLEX WITH VITAMIN C) tablet Take 1 tablet by mouth daily.  . cholecalciferol (VITAMIN D) 1000 units tablet Take 1,000 Units by mouth daily.  . cyanocobalamin 100 MCG tablet Take by mouth daily. Pt reports unsure of dosage, takes once daily  . Multiple Vitamin (MULTIVITAMIN WITH MINERALS) TABS tablet Take 1 tablet by mouth daily.  . sildenafil (REVATIO) 20 MG tablet TAKE 3-5 TABLETS BY MOUTH AS NEEDED.  [name] Kitchen tamsulosin (FLOMAX) 0.4 MG CAPS capsule Take 0.8 mg by mouth daily after supper.   . Vitamins/Minerals TABS Take by mouth.  . meclizine (ANTIVERT) 25 MG tablet Take 1 tablet (25 mg total) by mouth 3 (three) times daily as needed for dizziness.   No facility-administered encounter medications on file as of 05/02/2019.     ALLERGIES:  No Known Allergies   PHYSICAL EXAM:  ECOG  Performance status: 1  Vitals:   05/02/19 0812  BP: (!) 146/72  Pulse: 78  Resp: 16  Temp: 98.2 F (36.8 C)  SpO2: 100%   Filed Weights   05/02/19 0812  Weight: 167 lb 14.4 oz (76.2 kg)    Physical Exam Vitals signs reviewed.  Constitutional:      Appearance: Normal appearance. He is well-developed.  Cardiovascular:     Rate and Rhythm: Normal rate and regular rhythm.     Heart sounds: Normal heart sounds.  Pulmonary:     Effort: Pulmonary effort is normal.     Breath sounds: Normal breath sounds.  Abdominal:     General: There is no distension.     Palpations: Abdomen is soft. There is no mass.  Musculoskeletal: Normal range of motion.  Lymphadenopathy:     Cervical: No cervical adenopathy.  Skin:    General: Skin is warm.  Neurological:     General: No focal deficit present.     Mental Status: He is alert and oriented to person, place, and time.   Psychiatric:        Behavior: Behavior normal.      LABORATORY DATA:  I have reviewed the labs as listed.  CBC    Component Value Date/Time   WBC 4.5 04/25/2019 1037   RBC 4.25 04/25/2019 1037   HGB 13.1 04/25/2019 1037   HCT 40.7 04/25/2019 1037   PLT 96 (L) 04/25/2019 1037   MCV 95.8 04/25/2019 1037   MCH 30.8 04/25/2019 1037   MCHC 32.2 04/25/2019 1037   RDW 14.2 04/25/2019 1037   LYMPHSABS 1.3 04/25/2019 1037   MONOABS 0.5 04/25/2019 1037   EOSABS 0.1 04/25/2019 1037   BASOSABS 0.0 04/25/2019 1037   CMP Latest Ref Rng & Units 04/25/2019 12/25/2018 10/18/2018  Glucose 70 - 99 mg/dL 92 96 102(H)  BUN 8 - 23 mg/dL '22 19 19  ' Creatinine 0.61 - 1.24 mg/dL 1.59(H) 1.51(H) 1.46(H)  Sodium 135 - 145 mmol/L 139 139 137  Potassium 3.5 - 5.1 mmol/L 4.7 4.1 4.3  Chloride 98 - 111 mmol/L 105 105 106  CO2 22 - 32 mmol/L '24 27 25  ' Calcium 8.9 - 10.3 mg/dL 9.3 8.9 9.2  Total Protein 6.5 - 8.1 g/dL 6.7 6.6 6.6  Total Bilirubin 0.3 - 1.2 mg/dL 0.6 0.6 0.6  Alkaline Phos 38 - 126 U/L 65 65 64  AST 15 - 41 U/L '21 20 24  ' ALT 0 - 44 U/L '16 19 23         ' ASSESSMENT & PLAN:   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  Low-grade NHL/lymphoplasmacytic lymphoma: - Presentation with severe weakness. -Bone marrow biopsy on 05/06/2016 consistent with low-grade NHL with plasmacytoid differentiation. - 6 cycles of bendamustine and rituximab from 08/17/2016 through 02/02/2017, patient refused maintenance rituximab. - Denies any fevers, night sweats or weight loss in the past 6 months. -Denies any recurrent infections or hospitalizations.  Denies any bleeding or easy bruising. - Physical examination did not reveal any adenopathy or splenomegaly. -I reviewed his blood work.  SPEP was negative.  Free kappa light chains are slightly elevated at 28.  Ratio is normal. -I have recommended follow-up in 6 months.  2.  Moderate thrombocytopenia: - He has moderate thrombocytopenia ranging from  84-96 from 6 months ago.  Prior to that his platelet count was normal. - LDH was normal.  Etiology unclear at this time.  Immune thrombocytopenia is a consideration although lymphoma should also be considered. -As  the platelet count has been stable, I did not recommend any further work-up at this time.  If they get worse, will consider doing a bone marrow biopsy.  3.  CKD: -His creatinine is between 1.4 and 1.6.  -I have recommended avoiding nephrotoxins.  4.  Vertigo: -He does report occasional vertigo.  We have renewed prescription for meclizine.   Total time spent is 25 minutes with more than 50% of the time spent face-to-face discussing surveillance plan and coordination of care.  Orders placed this encounter:  Orders Placed This Encounter  Procedures  . CBC with Differential/Platelet  . Comprehensive metabolic panel  . Protein electrophoresis, serum  . Kappa/lambda light chains  . Lactate dehydrogenase      Derek Jack, MD [place] [phone]

## 2019-05-02 NOTE — Patient Instructions (Addendum)
[facility] at [facility] Discharge Instructions  You were seen today by Dr. Katragadda. He went over your recent lab results. He will see you back in 6 months for labs and follow up.   Thank you for choosing [facility] at [facility] to provide your oncology and hematology care.  To afford each patient quality time with our provider, please arrive at least 15 minutes before your scheduled appointment time.   If you have a lab appointment with the Cancer Center please come in thru the  Main Entrance and check in at the main information desk  You need to re-schedule your appointment should you arrive 10 or more minutes late.  We strive to give you quality time with our providers, and arriving late affects you and other patients whose appointments are after yours.  Also, if you no show three or more times for appointments you may be dismissed from the clinic at the providers discretion.     Again, thank you for choosing [facility].  Our hope is that these requests will decrease the amount of time that you wait before being seen by our physicians.       _____________________________________________________________  Should you have questions after your visit to [facility], please contact our office at [phone] between the hours of 8:00 a.m. and 4:30 p.m.  Voicemails left after 4:00 p.m. will not be returned until the following business day.  For prescription refill requests, have your pharmacy contact our office and allow 72 hours.    Cancer Center Support Programs:   > Cancer Support Group  2nd Tuesday of the month 1pm-2pm, Journey Room    

## 2019-05-31 DIAGNOSIS — Z0120 Encounter for dental examination and cleaning without abnormal findings: Secondary | ICD-10-CM | POA: Diagnosis not present

## 2019-06-06 DIAGNOSIS — Z0120 Encounter for dental examination and cleaning without abnormal findings: Secondary | ICD-10-CM | POA: Diagnosis not present

## 2019-07-18 DIAGNOSIS — N400 Enlarged prostate without lower urinary tract symptoms: Secondary | ICD-10-CM | POA: Diagnosis not present

## 2019-07-18 DIAGNOSIS — Z6822 Body mass index (BMI) 22.0-22.9, adult: Secondary | ICD-10-CM | POA: Diagnosis not present

## 2019-10-08 DIAGNOSIS — Z0120 Encounter for dental examination and cleaning without abnormal findings: Secondary | ICD-10-CM | POA: Diagnosis not present

## 2019-10-23 ENCOUNTER — Inpatient Hospital Stay (HOSPITAL_COMMUNITY): Payer: Medicare Other | Attending: Hematology

## 2019-10-23 DIAGNOSIS — R42 Dizziness and giddiness: Secondary | ICD-10-CM | POA: Insufficient documentation

## 2019-10-23 DIAGNOSIS — N189 Chronic kidney disease, unspecified: Secondary | ICD-10-CM | POA: Insufficient documentation

## 2019-10-23 DIAGNOSIS — Z87891 Personal history of nicotine dependence: Secondary | ICD-10-CM | POA: Diagnosis not present

## 2019-10-23 DIAGNOSIS — D696 Thrombocytopenia, unspecified: Secondary | ICD-10-CM | POA: Diagnosis not present

## 2019-10-23 DIAGNOSIS — C8300 Small cell B-cell lymphoma, unspecified site: Secondary | ICD-10-CM | POA: Insufficient documentation

## 2019-10-23 DIAGNOSIS — C880 Waldenstrom macroglobulinemia: Secondary | ICD-10-CM

## 2019-10-23 LAB — CBC WITH DIFFERENTIAL/PLATELET
Abs Immature Granulocytes: 0.01 10*3/uL (ref 0.00–0.07)
Basophils Absolute: 0 10*3/uL (ref 0.0–0.1)
Basophils Relative: 0 %
Eosinophils Absolute: 0.1 10*3/uL (ref 0.0–0.5)
Eosinophils Relative: 3 %
HCT: 44.6 % (ref 39.0–52.0)
Hemoglobin: 14.3 g/dL (ref 13.0–17.0)
Immature Granulocytes: 0 %
Lymphocytes Relative: 29 %
Lymphs Abs: 1.3 10*3/uL (ref 0.7–4.0)
MCH: 30.6 pg (ref 26.0–34.0)
MCHC: 32.1 g/dL (ref 30.0–36.0)
MCV: 95.3 fL (ref 80.0–100.0)
Monocytes Absolute: 0.5 10*3/uL (ref 0.1–1.0)
Monocytes Relative: 10 %
Neutro Abs: 2.7 10*3/uL (ref 1.7–7.7)
Neutrophils Relative %: 58 %
Platelets: 112 10*3/uL — ABNORMAL LOW (ref 150–400)
RBC: 4.68 MIL/uL (ref 4.22–5.81)
RDW: 13.9 % (ref 11.5–15.5)
WBC: 4.6 10*3/uL (ref 4.0–10.5)
nRBC: 0 % (ref 0.0–0.2)

## 2019-10-23 LAB — COMPREHENSIVE METABOLIC PANEL
ALT: 18 U/L (ref 0–44)
AST: 20 U/L (ref 15–41)
Albumin: 4.4 g/dL (ref 3.5–5.0)
Alkaline Phosphatase: 67 U/L (ref 38–126)
Anion gap: 10 (ref 5–15)
BUN: 19 mg/dL (ref 8–23)
CO2: 26 mmol/L (ref 22–32)
Calcium: 9.5 mg/dL (ref 8.9–10.3)
Chloride: 103 mmol/L (ref 98–111)
Creatinine, Ser: 1.53 mg/dL — ABNORMAL HIGH (ref 0.61–1.24)
GFR calc Af Amer: 47 mL/min — ABNORMAL LOW (ref >60)
GFR calc non Af Amer: 40 mL/min — ABNORMAL LOW (ref >60)
Glucose, Bld: 99 mg/dL (ref 70–99)
Potassium: 4.5 mmol/L (ref 3.5–5.1)
Sodium: 139 mmol/L (ref 135–145)
Total Bilirubin: 0.8 mg/dL (ref 0.3–1.2)
Total Protein: 6.8 g/dL (ref 6.5–8.1)

## 2019-10-23 LAB — LACTATE DEHYDROGENASE: LDH: 94 U/L — ABNORMAL LOW (ref 98–192)

## 2019-10-24 LAB — PROTEIN ELECTROPHORESIS, SERUM
A/G Ratio: 1.8 — ABNORMAL HIGH (ref 0.7–1.7)
Albumin ELP: 3.8 g/dL (ref 2.9–4.4)
Alpha-1-Globulin: 0.2 g/dL (ref 0.0–0.4)
Alpha-2-Globulin: 0.5 g/dL (ref 0.4–1.0)
Beta Globulin: 0.9 g/dL (ref 0.7–1.3)
Gamma Globulin: 0.5 g/dL (ref 0.4–1.8)
Globulin, Total: 2.1 g/dL — ABNORMAL LOW (ref 2.2–3.9)
Total Protein ELP: 5.9 g/dL — ABNORMAL LOW (ref 6.0–8.5)

## 2019-10-24 LAB — KAPPA/LAMBDA LIGHT CHAINS
Kappa free light chain: 28.7 mg/L — ABNORMAL HIGH (ref 3.3–19.4)
Kappa, lambda light chain ratio: 1.52 (ref 0.26–1.65)
Lambda free light chains: 18.9 mg/L (ref 5.7–26.3)

## 2019-10-30 ENCOUNTER — Encounter (HOSPITAL_COMMUNITY): Payer: Self-pay | Admitting: Nurse Practitioner

## 2019-10-30 ENCOUNTER — Inpatient Hospital Stay (HOSPITAL_COMMUNITY): Payer: Medicare Other | Admitting: Nurse Practitioner

## 2019-10-30 ENCOUNTER — Other Ambulatory Visit: Payer: Self-pay

## 2019-10-30 DIAGNOSIS — C8300 Small cell B-cell lymphoma, unspecified site: Secondary | ICD-10-CM | POA: Diagnosis not present

## 2019-10-30 DIAGNOSIS — Z87891 Personal history of nicotine dependence: Secondary | ICD-10-CM | POA: Diagnosis not present

## 2019-10-30 DIAGNOSIS — D696 Thrombocytopenia, unspecified: Secondary | ICD-10-CM | POA: Diagnosis not present

## 2019-10-30 DIAGNOSIS — R42 Dizziness and giddiness: Secondary | ICD-10-CM | POA: Diagnosis not present

## 2019-10-30 DIAGNOSIS — N189 Chronic kidney disease, unspecified: Secondary | ICD-10-CM | POA: Diagnosis not present

## 2019-10-30 NOTE — Patient Instructions (Signed)
[facility] at [facility] Discharge Instructions  Follow up in 6 months with labs    Thank you for choosing [facility] at [facility] to provide your oncology and hematology care.  To afford each patient quality time with our provider, please arrive at least 15 minutes before your scheduled appointment time.   If you have a lab appointment with the Cancer Center please come in thru the Main Entrance and check in at the main information desk.  You need to re-schedule your appointment should you arrive 10 or more minutes late.  We strive to give you quality time with our providers, and arriving late affects you and other patients whose appointments are after yours.  Also, if you no show three or more times for appointments you may be dismissed from the clinic at the providers discretion.     Again, thank you for choosing [facility].  Our hope is that these requests will decrease the amount of time that you wait before being seen by our physicians.       _____________________________________________________________  Should you have questions after your visit to [facility], please contact our office at [phone] between the hours of 8:00 a.m. and 4:30 p.m.  Voicemails left after 4:00 p.m. will not be returned until the following business day.  For prescription refill requests, have your pharmacy contact our office and allow 72 hours.    Due to Covid, you will need to wear a mask upon entering the hospital. If you do not have a mask, a mask will be given to you at the Main Entrance upon arrival. For doctor visits, patients may have 1 support person with them. For treatment visits, patients can not have anyone with them due to social distancing guidelines and our immunocompromised population.      

## 2019-10-30 NOTE — Assessment & Plan Note (Addendum)
1.  Low-grade NHL/lymphoplasmacytic lymphoma: -Presentation with severe weakness. -Bone marrow biopsy on 05/06/2016 consistent with low-grade NHL with plasmacytoid differentiation. -6 cycles of bendamustine and rituximab from 08/17/2016 through 02/02/2017, patient refused maintenance rituximab. -Denies any fevers, night sweats, chills, or weight loss in the past 6 months. -Denies any recurrent infections or hospitalizations.  Denies any bleeding or easy bruising. -Physical examination did not reveal any adenopathy or splenomegaly. -Blood work on12/06/2019 revealed SPEP was negative.  Free kappa light chains are slightly elevated at 28.7.  Ratio is normal at 1.52. -He will follow-up in 6 months with repeat labs.  2.  Moderate thrombocytopenia: -He has moderate thrombocytopenia ranging from 84-96 from 6 months ago.  Prior to that his platelet count was normal. -LDH was normal.  Etiology unclear at this time.  Immune thrombocytopenia is a consideration although lymphoma should also be considered. -As the platelet count has been stable.  I do not recommend any further work-up at this time.  If they get worse we will consider doing a bone marrow biopsy. -Labs on 10/23/2019 showed platelet count has improved to 112.  3.  CKD: -His creatinines between 1.4 and 1.6. -I have recommended avoiding nephrotoxins. -Labs on 10/23/2019 showed creatinine stable at 1.53  4.  Vertigo: -He does report occasional vertigo. -We have renewed his prescription for meclizine.

## 2019-10-30 NOTE — Progress Notes (Signed)
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 The pattern is somewhat nonspecific but the findings are consistent with non-Hodgkin B-cell lymphoma.   05/10/2016 Pathology Results   Bone Marrow, Aspirate,Biopsy, and Clot, right ilium - HYPERCELLULAR BONE MARROW WITH EXTENSIVE INVOLVEMENT BY NON-HODGKIN'S B-CELL LYMPHOMA.   05/13/2016 PET scan   Splenomegaly and mild diffuse increase metabolic activity in the spleen consistent with lymphomatous involvement. Diffuse osseous uptake likely due to diffuse lymphoma. Recommend correlation with any marrow stimulating drugs or rebound phenomenon.   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 Adjacent small amount of fluid has decreased since prior examination with interval clearing of previously noted small amount of extraluminal air.   06/03/2016 - 07/23/2016 Antibody Plan   Single-agent Rituxan   08/17/2016 -  Chemotherapy   The patient had palonosetron (ALOXI) injection 0.25 mg, 0.25 mg, Intravenous,  Once, 4 of 6 cycles  pegfilgrastim (NEULASTA ONPRO KIT) injection 6 mg, 6 mg, Subcutaneous, Once, 4 of 6 cycles  riTUXimab (RITUXAN) 700 mg in sodium chloride 0.9 % 250 mL (2.1875 mg/mL) chemo infusion, 375 mg/m2 = 700 mg, Intravenous,  Once, 4 of 6 cycles  bendamustine (BENDEKA) 175 mg in sodium chloride 0.9 % 50 mL (3.0702 mg/mL) chemo  infusion, 90 mg/m2 = 175 mg, Intravenous,  Once, 4 of 6 cycles  for chemotherapy treatment.     11/25/2016 - 11/28/2016 Hospital Admission   Admit date: 11/25/2016 Admission diagnosis: Febrile neutropenia Additional comments: Gastroenteritis   12/24/2016 - 12/27/2016 Hospital Admission   Admit date: 12/24/2016 Admission diagnosis: Neutropenic fever Additional comments:    03/01/2017 PET scan   IMPRESSION: 1. Significant improvement in splenomegaly compared to the prior exam, previously 1400 cc, currently 580 cc. 2. Resolution of prior diffuse marrow metabolic activity. 3. High activity focus along the anorectal junction, left anterior 1 o'clock position, not present previously. This could be physiologic activity or new tumor, correlate with digital rectal exam. 4. Nonspecific free pelvic fluid in the pelvis with a small mesenteric acute ill-defined collection of fluid in the upper abdomen. The mesenteric fluid is slightly increased in volume compared to the prior chest CT. Cause for these fluid collections is uncertain. 5. No hypermetabolic adenopathy. 6. Coronary, aortic arch, and branch vessel atherosclerotic vascular disease. Aortoiliac atherosclerotic vascular disease. 7. Enlarged prostate gland. 8.  Prominent stool throughout the colon favors constipation.      INTERVAL HISTORY:  Paul Keith 85 y.o. male returns for routine follow-up for malignant lymphoplasmacytic lymphoma.  He has been doing well since his last visit.  He denies any fevers, chills, night sweats, or unexplained weight loss.  He denies any easy bruising or bleeding.  He reports his energy levels are good. Denies any nausea, vomiting, or diarrhea. Denies any new pains. Had not noticed any recent bleeding such as epistaxis, hematuria or hematochezia. Denies recent chest pain on exertion, shortness of breath on minimal exertion, pre-syncopal episodes, or palpitations. Denies any numbness  or tingling in hands or feet.  Denies any recent fevers, infections, or recent hospitalizations. Patient reports appetite at 75% and energy level at 75%.  He is eating well maintain his weight at this time.   REVIEW OF SYSTEMS:  Review of Systems  All other systems reviewed and are negative.    PAST MEDICAL/SURGICAL HISTORY:  Past Medical History:  Diagnosis Date  . Anemia 04/22/2016  . Diverticulosis    pt reports recent admission for diverticulosis perforation this past week  . 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 Pancytopenia ([place]) 04/22/2016   Past Surgical History:  Procedure Laterality Date  . CATARACT EXTRACTION Bilateral   . TONSILLECTOMY       SOCIAL HISTORY:  Social History   Socioeconomic History  . Marital status: Married    Spouse name: Not on file  . Number of children: Not on file  . Years of education: Not on file  . Highest education level: Not on file  Occupational History  . Not on file  Tobacco Use  . Smoking status: Former Smoker    Quit date: 09/15/1962    Years since quitting: 42.1  . Smokeless tobacco: Never Used  Substance and Sexual Activity  . Alcohol use: Yes    Comment: OCCASSIONAL  . Drug use: No  . Sexual activity: Not on file    Comment: married  Other Topics Concern  . Not on file  Social History Narrative  . Not on file   Social Determinants of Health   Financial Resource Strain:   . Difficulty of Paying Living Expenses: Not on file  Food Insecurity:   . Worried About Charity fundraiser in the Last Year: Not on file  . Ran Out of Food in the Last Year: Not on file  Transportation Needs:   . Lack of Transportation (Medical): Not on file  . Lack of Transportation (Non-Medical): Not on file  Physical Activity:   . Days of Exercise per Week: Not on file  . Minutes of Exercise per Session: Not on file  Stress:   . Feeling of Stress : Not on file  Social Connections:   . Frequency of  Communication with Friends and Family: Not on file  . Frequency of Social Gatherings with Friends and Family: Not on file  . Attends Religious Services: Not on file  . Active Member of Clubs or Organizations: Not on file  . Attends Archivist Meetings: Not on file  . Marital Status: Not on file  Intimate Partner Violence:   . Fear of Current or Ex-Partner: Not on file  . Emotionally Abused: Not on file  . Physically Abused: Not on file  . Sexually Abused: Not on file    FAMILY HISTORY:  History reviewed. No pertinent family history.  CURRENT MEDICATIONS:  Outpatient Encounter Medications as of 10/30/2019  Medication Sig  . B Complex-C (B-COMPLEX WITH VITAMIN C) tablet Take 1 tablet by mouth daily.  . cholecalciferol (VITAMIN D) 1000 units tablet Take 1,000 Units by mouth daily.  . cyanocobalamin 100 MCG tablet Take by mouth daily. Pt reports unsure of dosage, takes once daily  . Multiple Vitamin (MULTIVITAMIN WITH MINERALS) TABS tablet Take 1 tablet by mouth daily.  . tamsulosin (FLOMAX) 0.4 MG CAPS capsule Take 0.8 mg by mouth daily after supper.   [name] Kitchen acetaminophen (TYLENOL) 500 MG tablet Take 500 mg by mouth every 6 (six) hours as needed for mild pain.  [name] Kitchen  meclizine (ANTIVERT) 25 MG tablet Take 1 tablet (25 mg total) by mouth 3 (three) times daily as needed for dizziness. (Patient not taking: Reported on 10/30/2019)  . sildenafil (REVATIO) 20 MG tablet TAKE 3-5 TABLETS BY MOUTH AS NEEDED.  . [DISCONTINUED] Vitamins/Minerals TABS Take by mouth.   No facility-administered encounter medications on file as of 10/30/2019.    ALLERGIES:  No Known Allergies   PHYSICAL EXAM:  ECOG Performance status: 1  Vitals:   10/30/19 0921  BP: (!) 135/93  Pulse: 76  Resp: 18  Temp: 97.7 F (36.5 C)  SpO2: 100%   Filed Weights   10/30/19 0921  Weight: 164 lb 8 oz (74.6 kg)    Physical Exam Constitutional:      Appearance: Normal appearance. He is normal weight.    Cardiovascular:     Rate and Rhythm: Normal rate and regular rhythm.     Heart sounds: Normal heart sounds.  Pulmonary:     Effort: Pulmonary effort is normal.     Breath sounds: Normal breath sounds.  Abdominal:     General: Bowel sounds are normal.     Palpations: Abdomen is soft.  Musculoskeletal:        General: Normal range of motion.  Skin:    General: Skin is warm.  Neurological:     Mental Status: He is alert and oriented to person, place, and time. Mental status is at baseline.  Psychiatric:        Mood and Affect: Mood normal.        Behavior: Behavior normal.        Thought Content: Thought content normal.        Judgment: Judgment normal.      LABORATORY DATA:  I have reviewed the labs as listed.  CBC    Component Value Date/Time   WBC 4.6 10/23/2019 0915   RBC 4.68 10/23/2019 0915   HGB 14.3 10/23/2019 0915   HCT 44.6 10/23/2019 0915   PLT 112 (L) 10/23/2019 0915   MCV 95.3 10/23/2019 0915   MCH 30.6 10/23/2019 0915   MCHC 32.1 10/23/2019 0915   RDW 13.9 10/23/2019 0915   LYMPHSABS 1.3 10/23/2019 0915   MONOABS 0.5 10/23/2019 0915   EOSABS 0.1 10/23/2019 0915   BASOSABS 0.0 10/23/2019 0915   CMP Latest Ref Rng & Units 10/23/2019 04/25/2019 12/25/2018  Glucose 70 - 99 mg/dL 99 92 96  BUN 8 - 23 mg/dL _0 Creatinine 0.61 - 1.24 mg/dL 1.53(H) 1.59(H) 1.51(H)  Sodium 135 - 145 mmol/L 139 139 139  Potassium 3.5 - 5.1 mmol/L 4.5 4.7 4.1  Chloride 98 - 111 mmol/L 103 105 105  CO2 22 - 32 mmol/L _1 Calcium 8.9 - 10.3 mg/dL 9.5 9.3 8.9  Total Protein 6.5 - 8.1 g/dL 6.8 6.7 6.6  Total Bilirubin 0.3 - 1.2 mg/dL 0.8 0.6 0.6  Alkaline Phos 38 - 126 U/L 67 65 65  AST 15 - 41 U/L _2 ALT 0 - 44 U/L _3 I personally performed a face-to-face visit.  All questions were answered to patient's stated satisfaction. Encouraged patient to call with any new concerns or questions before his next visit to the cancer center and we can certain see  him sooner, if needed.     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  Low-grade NHL/lymphoplasmacytic lymphoma: -Presentation with severe weakness. -Bone marrow biopsy on 05/06/2016 consistent with low-grade  NHL with plasmacytoid differentiation. -6 cycles of bendamustine and rituximab from 08/17/2016 through 02/02/2017, patient refused maintenance rituximab. -Denies any fevers, night sweats, chills, or weight loss in the past 6 months. -Denies any recurrent infections or hospitalizations.  Denies any bleeding or easy bruising. -Physical examination did not reveal any adenopathy or splenomegaly. -Blood work on12/06/2019 revealed SPEP was negative.  Free kappa light chains are slightly elevated at 28.7.  Ratio is normal at 1.52. -He will follow-up in 6 months with repeat labs.  2.  Moderate thrombocytopenia: -He has moderate thrombocytopenia ranging from 84-96 from 6 months ago.  Prior to that his platelet count was normal. -LDH was normal.  Etiology unclear at this time.  Immune thrombocytopenia is a consideration although lymphoma should also be considered. -As the platelet count has been stable.  I do not recommend any further work-up at this time.  If they get worse we will consider doing a bone marrow biopsy. -Labs on 10/23/2019 showed platelet count has improved to 112.  3.  CKD: -His creatinines between 1.4 and 1.6. -I have recommended avoiding nephrotoxins. -Labs on 10/23/2019 showed creatinine stable at 1.53  4.  Vertigo: -He does report occasional vertigo. -We have renewed his prescription for meclizine.      Orders placed this encounter:  No orders of the defined types were placed in this encounter.     Francene Finders, FNP-C [place] [phone]

## 2019-10-30 NOTE — Addendum Note (Signed)
Addended by: Glennie Isle on: 10/30/2019 10:55 AM   Modules accepted: Orders

## 2019-12-30 ENCOUNTER — Ambulatory Visit: Payer: Medicare Other | Attending: Internal Medicine

## 2019-12-30 ENCOUNTER — Other Ambulatory Visit: Payer: Self-pay

## 2019-12-30 DIAGNOSIS — Z23 Encounter for immunization: Secondary | ICD-10-CM | POA: Insufficient documentation

## 2019-12-30 NOTE — Progress Notes (Signed)
   Covid-19 Vaccination Clinic  Name:  Paul Keith    MRN: [id number] DOB: 1930-08-18  12/30/2019  [name] was observed post Covid-19 immunization for 15 minutes without incidence. He was provided with Vaccine Information Sheet and instruction to access the V-Safe system.   [name] was instructed to call 911 with any severe reactions post vaccine: [name] Kitchen Difficulty breathing  . Swelling of your face and throat  . A fast heartbeat  . A bad rash all over your body  . Dizziness and weakness    Immunizations Administered    Name Date Dose VIS Date Route   Moderna COVID-19 Vaccine 12/30/2019  2:31 PM 0.5 mL 10/16/2019 Intramuscular   Manufacturer: Moderna   Lot: [id number]   [place]: [id number]

## 2020-01-27 ENCOUNTER — Ambulatory Visit: Payer: Medicare Other | Attending: Internal Medicine

## 2020-01-27 DIAGNOSIS — Z23 Encounter for immunization: Secondary | ICD-10-CM

## 2020-01-27 NOTE — Progress Notes (Signed)
   Covid-19 Vaccination Clinic  Name:  Najir Roop    MRN: [id number] DOB: 11/05/1930  01/27/2020  Mr. Yepiz was observed post Covid-19 immunization for 30 minutes based on pre-vaccination screening without incident. He was provided with Vaccine Information Sheet and instruction to access the V-Safe system.   Mr. Willers was instructed to call 911 with any severe reactions post vaccine: [name] Kitchen Difficulty breathing  . Swelling of face and throat  . A fast heartbeat  . A bad rash all over body  . Dizziness and weakness   Immunizations Administered    Name Date Dose VIS Date Route   Moderna COVID-19 Vaccine 01/27/2020  1:09 PM 0.5 mL 10/16/2019 Intramuscular   Manufacturer: Moderna   Lot: [id number]   [place]: [id number]

## 2020-04-22 ENCOUNTER — Inpatient Hospital Stay (HOSPITAL_COMMUNITY): Payer: Medicare Other | Attending: Hematology

## 2020-04-22 ENCOUNTER — Other Ambulatory Visit: Payer: Self-pay

## 2020-04-22 DIAGNOSIS — N189 Chronic kidney disease, unspecified: Secondary | ICD-10-CM | POA: Diagnosis not present

## 2020-04-22 DIAGNOSIS — D696 Thrombocytopenia, unspecified: Secondary | ICD-10-CM | POA: Diagnosis not present

## 2020-04-22 DIAGNOSIS — C8300 Small cell B-cell lymphoma, unspecified site: Secondary | ICD-10-CM | POA: Diagnosis present

## 2020-04-22 DIAGNOSIS — Z87891 Personal history of nicotine dependence: Secondary | ICD-10-CM | POA: Insufficient documentation

## 2020-04-22 LAB — COMPREHENSIVE METABOLIC PANEL
ALT: 17 U/L (ref 0–44)
AST: 18 U/L (ref 15–41)
Albumin: 4.3 g/dL (ref 3.5–5.0)
Alkaline Phosphatase: 73 U/L (ref 38–126)
Anion gap: 9 (ref 5–15)
BUN: 23 mg/dL (ref 8–23)
CO2: 28 mmol/L (ref 22–32)
Calcium: 9.6 mg/dL (ref 8.9–10.3)
Chloride: 100 mmol/L (ref 98–111)
Creatinine, Ser: 1.56 mg/dL — ABNORMAL HIGH (ref 0.61–1.24)
GFR calc Af Amer: 46 mL/min — ABNORMAL LOW (ref >60)
GFR calc non Af Amer: 39 mL/min — ABNORMAL LOW (ref >60)
Glucose, Bld: 101 mg/dL — ABNORMAL HIGH (ref 70–99)
Potassium: 4.5 mmol/L (ref 3.5–5.1)
Sodium: 137 mmol/L (ref 135–145)
Total Bilirubin: 0.6 mg/dL (ref 0.3–1.2)
Total Protein: 6.6 g/dL (ref 6.5–8.1)

## 2020-04-22 LAB — CBC WITH DIFFERENTIAL/PLATELET
Abs Immature Granulocytes: 0.02 10*3/uL (ref 0.00–0.07)
Basophils Absolute: 0 10*3/uL (ref 0.0–0.1)
Basophils Relative: 0 %
Eosinophils Absolute: 0.1 10*3/uL (ref 0.0–0.5)
Eosinophils Relative: 2 %
HCT: 43.6 % (ref 39.0–52.0)
Hemoglobin: 14.2 g/dL (ref 13.0–17.0)
Immature Granulocytes: 0 %
Lymphocytes Relative: 24 %
Lymphs Abs: 1.2 10*3/uL (ref 0.7–4.0)
MCH: 30.3 pg (ref 26.0–34.0)
MCHC: 32.6 g/dL (ref 30.0–36.0)
MCV: 93 fL (ref 80.0–100.0)
Monocytes Absolute: 0.5 10*3/uL (ref 0.1–1.0)
Monocytes Relative: 10 %
Neutro Abs: 3.1 10*3/uL (ref 1.7–7.7)
Neutrophils Relative %: 64 %
Platelets: 126 10*3/uL — ABNORMAL LOW (ref 150–400)
RBC: 4.69 MIL/uL (ref 4.22–5.81)
RDW: 13.7 % (ref 11.5–15.5)
WBC: 4.9 10*3/uL (ref 4.0–10.5)
nRBC: 0 % (ref 0.0–0.2)

## 2020-04-22 LAB — VITAMIN D 25 HYDROXY (VIT D DEFICIENCY, FRACTURES): Vit D, 25-Hydroxy: 50.02 ng/mL (ref 30–100)

## 2020-04-22 LAB — LACTATE DEHYDROGENASE: LDH: 94 U/L — ABNORMAL LOW (ref 98–192)

## 2020-04-22 LAB — VITAMIN B12: Vitamin B-12: 527 pg/mL (ref 180–914)

## 2020-04-23 LAB — PROTEIN ELECTROPHORESIS, SERUM
A/G Ratio: 1.7 (ref 0.7–1.7)
Albumin ELP: 3.9 g/dL (ref 2.9–4.4)
Alpha-1-Globulin: 0.2 g/dL (ref 0.0–0.4)
Alpha-2-Globulin: 0.6 g/dL (ref 0.4–1.0)
Beta Globulin: 1 g/dL (ref 0.7–1.3)
Gamma Globulin: 0.5 g/dL (ref 0.4–1.8)
Globulin, Total: 2.3 g/dL (ref 2.2–3.9)
Total Protein ELP: 6.2 g/dL (ref 6.0–8.5)

## 2020-04-23 LAB — KAPPA/LAMBDA LIGHT CHAINS
Kappa free light chain: 32.6 mg/L — ABNORMAL HIGH (ref 3.3–19.4)
Kappa, lambda light chain ratio: 1.95 — ABNORMAL HIGH (ref 0.26–1.65)
Lambda free light chains: 16.7 mg/L (ref 5.7–26.3)

## 2020-04-29 ENCOUNTER — Inpatient Hospital Stay (HOSPITAL_COMMUNITY): Payer: Medicare Other | Admitting: Hematology

## 2020-04-29 ENCOUNTER — Other Ambulatory Visit: Payer: Self-pay

## 2020-04-29 VITALS — BP 148/58 | HR 66 | Temp 97.5°F | Resp 19 | Wt 161.8 lb

## 2020-04-29 DIAGNOSIS — C8300 Small cell B-cell lymphoma, unspecified site: Secondary | ICD-10-CM

## 2020-04-29 DIAGNOSIS — Z87891 Personal history of nicotine dependence: Secondary | ICD-10-CM | POA: Diagnosis not present

## 2020-04-29 DIAGNOSIS — D696 Thrombocytopenia, unspecified: Secondary | ICD-10-CM | POA: Diagnosis not present

## 2020-04-29 DIAGNOSIS — N189 Chronic kidney disease, unspecified: Secondary | ICD-10-CM | POA: Diagnosis not present

## 2020-04-29 NOTE — Progress Notes (Signed)
[place], [place] [address]   CLINIC:  Medical Oncology/Hematology  PCP:  Scotty Court, DO [address] [place] [address]  [phone]  REASON FOR VISIT:  Follow-up for low-grade non-Hodgkin's lymphoma.  CURRENT THERAPY: Observation  INTERVAL HISTORY:  [name], a 86 y.o. male, returns for routine follow-up for his low-grade non-Hodgkin's lymphoma. Paul Keith was last seen on 05/02/2019.  Today he reports no issues with N/V/D, no weight loss and no infections in the last couple months. He denies having any abnormal bleeding. His appetite has been good.   REVIEW OF SYSTEMS:  Review of Systems  Constitutional: Positive for appetite change (mildly decreased) and fatigue (mild). Negative for unexpected weight change.  Gastrointestinal: Negative for diarrhea, nausea and vomiting.  Neurological: Positive for dizziness.  Hematological: Negative for adenopathy.  All other systems reviewed and are negative.   PAST MEDICAL/SURGICAL HISTORY:  Past Medical History:  Diagnosis Date  . Anemia 04/22/2016  . Diverticulosis    pt reports recent admission for diverticulosis perforation this past week  . 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[place]) 05/21/2016  . Pancytopenia ([place]) 04/22/2016   Past Surgical History:  Procedure Laterality Date  . CATARACT EXTRACTION Bilateral   . TONSILLECTOMY      SOCIAL HISTORY:  Social History   Socioeconomic History  . Marital status: Widowed    Spouse name: Not on file  . Number of children: Not on file  . Years of education: Not on file  . Highest education level: Not on file  Occupational History  . Not on file  Tobacco Use  . Smoking status: Former Smoker    Quit date: 09/15/1962    Years since quitting: 57.6  . Smokeless tobacco: Never Used  Substance and Sexual Activity  . Alcohol use: Yes    Comment: OCCASSIONAL  . Drug use:  No  . Sexual activity: Not on file    Comment: married  Other Topics Concern  . Not on file  Social History Narrative  . Not on file   Social Determinants of Health   Financial Resource Strain:   . Difficulty of Paying Living Expenses:   Food Insecurity:   . Worried About Charity fundraiser in the Last Year:   . Arboriculturist in the Last Year:   Transportation Needs:   . Film/video editor (Medical):   [name] Kitchen Lack of Transportation (Non-Medical):   Physical Activity:   . Days of Exercise per Week:   . Minutes of Exercise per Session:   Stress:   . Feeling of Stress :   Social Connections:   . Frequency of Communication with Friends and Family:   . Frequency of Social Gatherings with Friends and Family:   . Attends Religious Services:   . Active Member of Clubs or Organizations:   . Attends Archivist Meetings:   [name] Kitchen Marital Status:   Intimate Partner Violence:   . Fear of Current or Ex-Partner:   . Emotionally Abused:   [name] Kitchen Physically Abused:   . Sexually Abused:     FAMILY HISTORY:  No family history on file.  CURRENT MEDICATIONS:  Current Outpatient Medications  Medication Sig Dispense Refill  . B Complex-C (B-COMPLEX WITH VITAMIN C) tablet Take 1 tablet by mouth daily.    . cholecalciferol (VITAMIN D) 1000 units tablet Take 1,000 Units by mouth daily.    [name] Kitchen  cyanocobalamin 100 MCG tablet Take by mouth daily. Pt reports unsure of dosage, takes once daily    . Multiple Vitamin (MULTIVITAMIN WITH MINERALS) TABS tablet Take 1 tablet by mouth daily.    . tamsulosin (FLOMAX) 0.4 MG CAPS capsule Take 0.8 mg by mouth daily after supper.     [name] Kitchen acetaminophen (TYLENOL) 500 MG tablet Take 500 mg by mouth every 6 (six) hours as needed for mild pain. (Patient not taking: Reported on 04/29/2020)    . meclizine (ANTIVERT) 25 MG tablet Take 1 tablet (25 mg total) by mouth 3 (three) times daily as needed for dizziness. (Patient not taking: Reported on 04/29/2020) 30 tablet 1  .  sildenafil (REVATIO) 20 MG tablet TAKE 3-5 TABLETS BY MOUTH AS NEEDED. (Patient not taking: Reported on 04/29/2020)     No current facility-administered medications for this visit.    ALLERGIES:  No Known Allergies  PHYSICAL EXAM:  Performance status (ECOG): 1 - Symptomatic but completely ambulatory  Vitals:   04/29/20 1035  BP: (!) 148/58  Pulse: 66  Resp: 19  Temp: (!) 97.5 F (36.4 C)  SpO2: 100%   Wt Readings from Last 3 Encounters:  04/29/20 161 lb 12.8 oz (73.4 kg)  10/30/19 164 lb 8 oz (74.6 kg)  05/02/19 167 lb 14.4 oz (76.2 kg)   Physical Exam Vitals reviewed.  Constitutional:      Appearance: Normal appearance.  Cardiovascular:     Rate and Rhythm: Normal rate and regular rhythm.     Pulses: Normal pulses.     Heart sounds: Normal heart sounds.  Pulmonary:     Effort: Pulmonary effort is normal.     Breath sounds: Normal breath sounds.  Abdominal:     Palpations: Abdomen is soft. There is no hepatomegaly, splenomegaly or mass.     Tenderness: There is no abdominal tenderness.     Hernia: No hernia is present.  Musculoskeletal:     Right lower leg: No edema.     Left lower leg: No edema.  Lymphadenopathy:     Cervical: No cervical adenopathy.     Upper Body:     Right upper body: No supraclavicular or axillary adenopathy.     Left upper body: No supraclavicular or axillary adenopathy.     Lower Body: No right inguinal adenopathy. No left inguinal adenopathy.  Neurological:     General: No focal deficit present.     Mental Status: He is alert and oriented to person, place, and time.  Psychiatric:        Mood and Affect: Mood normal.        Behavior: Behavior normal.     LABORATORY DATA:  I have reviewed the labs as listed.  CBC Latest Ref Rng & Units 04/22/2020 10/23/2019 04/25/2019  WBC 4.0 - 10.5 K/uL 4.9 4.6 4.5  Hemoglobin 13.0 - 17.0 g/dL 14.2 14.3 13.1  Hematocrit 39 - 52 % 43.6 44.6 40.7  Platelets 150 - 400 K/uL 126(L) 112(L) 96(L)   CMP  Latest Ref Rng & Units 04/22/2020 10/23/2019 04/25/2019  Glucose 70 - 99 mg/dL 101(H) 99 92  BUN 8 - 23 mg/dL '23 19 22  ' Creatinine 0.61 - 1.24 mg/dL 1.56(H) 1.53(H) 1.59(H)  Sodium 135 - 145 mmol/L 137 139 139  Potassium 3.5 - 5.1 mmol/L 4.5 4.5 4.7  Chloride 98 - 111 mmol/L 100 103 105  CO2 22 - 32 mmol/L '28 26 24  ' Calcium 8.9 - 10.3 mg/dL 9.6 9.5 9.3  Total Protein  6.5 - 8.1 g/dL 6.6 6.8 6.7  Total Bilirubin 0.3 - 1.2 mg/dL 0.6 0.8 0.6  Alkaline Phos 38 - 126 U/L 73 67 65  AST 15 - 41 U/L '18 20 21  ' ALT 0 - 44 U/L '17 18 16      ' Component Value Date/Time   RBC 4.69 04/22/2020 0958   MCV 93.0 04/22/2020 0958   MCH 30.3 04/22/2020 0958   MCHC 32.6 04/22/2020 0958   RDW 13.7 04/22/2020 0958   LYMPHSABS 1.2 04/22/2020 0958   MONOABS 0.5 04/22/2020 0958   EOSABS 0.1 04/22/2020 0958   BASOSABS 0.0 04/22/2020 0958    DIAGNOSTIC IMAGING:  I have independently reviewed the scans and discussed with the patient. No results found.   ASSESSMENT:  1.  Low-grade NHL/lymphoplasmacytic lymphoma: -Presentation with severe weakness.  Bone marrow biopsy on 05/06/2016 consistent with low-grade NHL and plasmacytoid differentiation. -6 cycles of bendamustine and rituximab from 08/17/2016 through 02/02/2017, patient refused maintenance rituximab.   PLAN:  1.  Low-grade NHL/lymphoplasmacytic lymphoma: -he does not have any B symptoms.  No palpable adenopathy or splenomegaly. -Reviewed labs from 04/22/2020.  LDH was normal.  SPEP was negative.  CBC was normal.  Kappa light chains elevated at 32.6 and ratio of 1.95, slightly worse from last time. -I plan to see him back in 6 months with repeat labs and physical exam.  He was told to come back sooner should he develop any B symptoms.  2.  Moderate thrombocytopenia: -Platelet count on 04/22/2020 improved to 126.  3.  CKD: -His creatinine is 1.56 and at baseline.  Closely monitor.  4.  Vertigo: -On and off vertigo.  Continue meclizine as  needed.   Orders placed this encounter:  No orders of the defined types were placed in this encounter.    Derek Jack, MD [place] [phone]   I, Milinda Antis, am acting as a scribe for Dr. Sanda Linger.  I, Derek Jack MD, have reviewed the above documentation for accuracy and completeness, and I agree with the above.

## 2020-04-29 NOTE — Patient Instructions (Signed)
[facility] Discharge Instructions  You were seen today by Dr. Katragadda. He went over your recent results. Dr. Katragadda will see you back in 6 months for labs and follow up.   Thank you for choosing [facility] at [facility] to provide your oncology and hematology care.  To afford each patient quality time with our provider, please arrive at least 15 minutes before your scheduled appointment time.   If you have a lab appointment with the Cancer Center please come in thru the Main Entrance and check in at the main information desk  You need to re-schedule your appointment should you arrive 10 or more minutes late.  We strive to give you quality time with our providers, and arriving late affects you and other patients whose appointments are after yours.  Also, if you no show three or more times for appointments you may be dismissed from the clinic at the providers discretion.     Again, thank you for choosing [facility].  Our hope is that these requests will decrease the amount of time that you wait before being seen by our physicians.       _____________________________________________________________  Should you have questions after your visit to [facility], please contact our office at [phone] between the hours of 8:00 a.m. and 4:30 p.m.  Voicemails left after 4:00 p.m. will not be returned until the following business day.  For prescription refill requests, have your pharmacy contact our office and allow 72 hours.    Cancer Center Support Programs:   > Cancer Support Group  2nd Tuesday of the month 1pm-2pm, Journey Room    

## 2020-06-02 DIAGNOSIS — H43811 Vitreous degeneration, right eye: Secondary | ICD-10-CM | POA: Diagnosis not present

## 2020-07-17 DIAGNOSIS — Z0000 Encounter for general adult medical examination without abnormal findings: Secondary | ICD-10-CM | POA: Diagnosis not present

## 2020-07-17 DIAGNOSIS — N400 Enlarged prostate without lower urinary tract symptoms: Secondary | ICD-10-CM | POA: Diagnosis not present

## 2020-07-17 DIAGNOSIS — Z6822 Body mass index (BMI) 22.0-22.9, adult: Secondary | ICD-10-CM | POA: Diagnosis not present

## 2020-07-17 DIAGNOSIS — Z125 Encounter for screening for malignant neoplasm of prostate: Secondary | ICD-10-CM | POA: Diagnosis not present

## 2020-10-21 DIAGNOSIS — L03032 Cellulitis of left toe: Secondary | ICD-10-CM | POA: Diagnosis not present

## 2020-10-21 DIAGNOSIS — M79672 Pain in left foot: Secondary | ICD-10-CM | POA: Diagnosis not present

## 2020-10-21 DIAGNOSIS — L600 Ingrowing nail: Secondary | ICD-10-CM | POA: Diagnosis not present

## 2020-10-21 DIAGNOSIS — M79675 Pain in left toe(s): Secondary | ICD-10-CM | POA: Diagnosis not present

## 2020-10-22 ENCOUNTER — Other Ambulatory Visit (HOSPITAL_COMMUNITY): Payer: Medicare Other

## 2020-10-29 ENCOUNTER — Ambulatory Visit (HOSPITAL_COMMUNITY): Payer: Medicare Other | Admitting: Hematology

## 2020-11-03 DIAGNOSIS — M79671 Pain in right foot: Secondary | ICD-10-CM | POA: Diagnosis not present

## 2020-11-03 DIAGNOSIS — M79674 Pain in right toe(s): Secondary | ICD-10-CM | POA: Diagnosis not present

## 2020-11-03 DIAGNOSIS — L600 Ingrowing nail: Secondary | ICD-10-CM | POA: Diagnosis not present

## 2020-11-05 ENCOUNTER — Inpatient Hospital Stay (HOSPITAL_COMMUNITY): Payer: Medicare Other | Attending: Medical

## 2020-11-05 ENCOUNTER — Other Ambulatory Visit: Payer: Self-pay

## 2020-11-05 DIAGNOSIS — C8300 Small cell B-cell lymphoma, unspecified site: Secondary | ICD-10-CM | POA: Diagnosis not present

## 2020-11-05 DIAGNOSIS — D696 Thrombocytopenia, unspecified: Secondary | ICD-10-CM | POA: Diagnosis not present

## 2020-11-05 DIAGNOSIS — Z87891 Personal history of nicotine dependence: Secondary | ICD-10-CM | POA: Diagnosis not present

## 2020-11-05 DIAGNOSIS — N189 Chronic kidney disease, unspecified: Secondary | ICD-10-CM | POA: Diagnosis not present

## 2020-11-05 LAB — COMPREHENSIVE METABOLIC PANEL
ALT: 19 U/L (ref 0–44)
AST: 20 U/L (ref 15–41)
Albumin: 4.7 g/dL (ref 3.5–5.0)
Alkaline Phosphatase: 67 U/L (ref 38–126)
Anion gap: 7 (ref 5–15)
BUN: 27 mg/dL — ABNORMAL HIGH (ref 8–23)
CO2: 25 mmol/L (ref 22–32)
Calcium: 9.3 mg/dL (ref 8.9–10.3)
Chloride: 102 mmol/L (ref 98–111)
Creatinine, Ser: 1.69 mg/dL — ABNORMAL HIGH (ref 0.61–1.24)
GFR, Estimated: 39 mL/min — ABNORMAL LOW (ref >60)
Glucose, Bld: 86 mg/dL (ref 70–99)
Potassium: 4.2 mmol/L (ref 3.5–5.1)
Sodium: 134 mmol/L — ABNORMAL LOW (ref 135–145)
Total Bilirubin: 0.6 mg/dL (ref 0.3–1.2)
Total Protein: 7 g/dL (ref 6.5–8.1)

## 2020-11-05 LAB — CBC WITH DIFFERENTIAL/PLATELET
Abs Immature Granulocytes: 0.02 10*3/uL (ref 0.00–0.07)
Basophils Absolute: 0 10*3/uL (ref 0.0–0.1)
Basophils Relative: 0 %
Eosinophils Absolute: 0.1 10*3/uL (ref 0.0–0.5)
Eosinophils Relative: 3 %
HCT: 44.8 % (ref 39.0–52.0)
Hemoglobin: 14.4 g/dL (ref 13.0–17.0)
Immature Granulocytes: 0 %
Lymphocytes Relative: 19 %
Lymphs Abs: 1.1 10*3/uL (ref 0.7–4.0)
MCH: 30.3 pg (ref 26.0–34.0)
MCHC: 32.1 g/dL (ref 30.0–36.0)
MCV: 94.1 fL (ref 80.0–100.0)
Monocytes Absolute: 0.6 10*3/uL (ref 0.1–1.0)
Monocytes Relative: 12 %
Neutro Abs: 3.6 10*3/uL (ref 1.7–7.7)
Neutrophils Relative %: 66 %
Platelets: 146 10*3/uL — ABNORMAL LOW (ref 150–400)
RBC: 4.76 MIL/uL (ref 4.22–5.81)
RDW: 14.3 % (ref 11.5–15.5)
WBC: 5.5 10*3/uL (ref 4.0–10.5)
nRBC: 0 % (ref 0.0–0.2)

## 2020-11-05 LAB — VITAMIN D 25 HYDROXY (VIT D DEFICIENCY, FRACTURES): Vit D, 25-Hydroxy: 63.88 ng/mL (ref 30–100)

## 2020-11-05 LAB — LACTATE DEHYDROGENASE: LDH: 109 U/L (ref 98–192)

## 2020-11-05 LAB — VITAMIN B12: Vitamin B-12: 609 pg/mL (ref 180–914)

## 2020-11-06 LAB — KAPPA/LAMBDA LIGHT CHAINS
Kappa free light chain: 38.2 mg/L — ABNORMAL HIGH (ref 3.3–19.4)
Kappa, lambda light chain ratio: 1.86 — ABNORMAL HIGH (ref 0.26–1.65)
Lambda free light chains: 20.5 mg/L (ref 5.7–26.3)

## 2020-11-08 LAB — PROTEIN ELECTROPHORESIS, SERUM
A/G Ratio: 1.9 — ABNORMAL HIGH (ref 0.7–1.7)
Albumin ELP: 4.3 g/dL (ref 2.9–4.4)
Alpha-1-Globulin: 0.2 g/dL (ref 0.0–0.4)
Alpha-2-Globulin: 0.6 g/dL (ref 0.4–1.0)
Beta Globulin: 1.1 g/dL (ref 0.7–1.3)
Gamma Globulin: 0.5 g/dL (ref 0.4–1.8)
Globulin, Total: 2.3 g/dL (ref 2.2–3.9)
Total Protein ELP: 6.6 g/dL (ref 6.0–8.5)

## 2020-11-12 ENCOUNTER — Inpatient Hospital Stay (HOSPITAL_BASED_OUTPATIENT_CLINIC_OR_DEPARTMENT_OTHER): Payer: Medicare Other | Admitting: Hematology

## 2020-11-12 ENCOUNTER — Other Ambulatory Visit: Payer: Self-pay

## 2020-11-12 VITALS — BP 161/76 | HR 70 | Temp 98.4°F | Resp 17 | Wt 165.5 lb

## 2020-11-12 DIAGNOSIS — C8300 Small cell B-cell lymphoma, unspecified site: Secondary | ICD-10-CM

## 2020-11-12 DIAGNOSIS — Z87891 Personal history of nicotine dependence: Secondary | ICD-10-CM | POA: Diagnosis not present

## 2020-11-12 DIAGNOSIS — N189 Chronic kidney disease, unspecified: Secondary | ICD-10-CM | POA: Diagnosis not present

## 2020-11-12 DIAGNOSIS — D696 Thrombocytopenia, unspecified: Secondary | ICD-10-CM | POA: Diagnosis not present

## 2020-11-12 NOTE — Progress Notes (Signed)
Paul Keith, [place] [address]   CLINIC:  Medical Oncology/Hematology  PCP:  Scotty Court, DO [address] / [place] [address]  [phone]  REASON FOR VISIT:  Follow-up for low-grade non-Hodgkin's lymphoma  PRIOR THERAPY: Bendamustine and rituximab x 6 cycles from 08/17/2016 to 02/02/2017  CURRENT THERAPY: Observation  INTERVAL HISTORY:  [name], a 86 y.o. male, returns for routine follow-up for his low-grade non-Hodgkin's lymphoma. Sian was last seen on 04/29/2020.  Today he reports feeling well. He denies having any recent infections, F/C, night sweats or weight loss. He reports getting up at night to urinate, but admits that he does not drink enough water.   REVIEW OF SYSTEMS:  Review of Systems  Constitutional: Positive for appetite change (90%) and fatigue (75%). Negative for chills, diaphoresis, fever and unexpected weight change.  All other systems reviewed and are negative.   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   Pancytopenia ([place]) 04/22/2016   Past Surgical History:  Procedure Laterality Date   CATARACT EXTRACTION Bilateral    TONSILLECTOMY      SOCIAL HISTORY:  Social History   Socioeconomic History   Marital status: Widowed    Spouse name: Not on file   Number of children: Not on file   Years of education: Not on file   Highest education level: Not on file  Occupational History   Not on file  Tobacco Use   Smoking status: Former Smoker    Quit date: 09/15/1962    Years since quitting: 58.2   Smokeless tobacco: Never Used  Substance and Sexual Activity   Alcohol use: Yes    Comment: OCCASSIONAL   Drug use: No   Sexual  activity: Not on file    Comment: married  Other Topics Concern   Not on file  Social History Narrative   Not on file   Social Determinants of Health   Financial Resource Strain: Not on file  Food Insecurity: Not on file  Transportation Needs: Not on file  Physical Activity: Not on file  Stress: Not on file  Social Connections: Not on file  Intimate Partner Violence: Not on file    FAMILY HISTORY:  No family history on file.  CURRENT MEDICATIONS:  Current Outpatient Medications  Medication Sig Dispense Refill   acetaminophen (TYLENOL) 500 MG tablet Take 500 mg by mouth every 6 (six) hours as needed for mild pain. (Patient not taking: Reported on 04/29/2020)     B Complex-C (B-COMPLEX WITH VITAMIN C) tablet Take 1 tablet by mouth daily.     cholecalciferol (VITAMIN D) 1000 units tablet Take 1,000 Units by mouth daily.     cyanocobalamin 100 MCG tablet Take by mouth daily. Pt reports unsure of dosage, takes once daily     meclizine (ANTIVERT) 25 MG tablet Take 1 tablet (25 mg total) by mouth 3 (three) times daily as needed for dizziness. (Patient not taking: Reported on 04/29/2020) 30 tablet 1   Multiple Vitamin (MULTIVITAMIN WITH MINERALS) TABS tablet Take 1 tablet by mouth daily.     sildenafil (REVATIO) 20 MG tablet TAKE 3-5 TABLETS BY MOUTH AS NEEDED. (Patient not taking: Reported on 04/29/2020)     tamsulosin (FLOMAX) 0.4 MG  CAPS capsule Take 0.8 mg by mouth daily after supper.      No current facility-administered medications for this visit.    ALLERGIES:  No Known Allergies  PHYSICAL EXAM:  Performance status (ECOG): 1 - Symptomatic but completely ambulatory  There were no vitals filed for this visit. Wt Readings from Last 3 Encounters:  04/29/20 161 lb 12.8 oz (73.4 kg)  10/30/19 164 lb 8 oz (74.6 kg)  05/02/19 167 lb 14.4 oz (76.2 kg)   Physical Exam Vitals reviewed.  Constitutional:      Appearance: Normal appearance.  Cardiovascular:     Rate and  Rhythm: Normal rate and regular rhythm.     Pulses: Normal pulses.     Heart sounds: Normal heart sounds.  Pulmonary:     Effort: Pulmonary effort is normal.     Breath sounds: Normal breath sounds.  Chest:  Breasts:     Right: No axillary adenopathy or supraclavicular adenopathy.     Left: No axillary adenopathy or supraclavicular adenopathy.    Abdominal:     Palpations: Abdomen is soft. There is no hepatomegaly, splenomegaly or mass.     Tenderness: There is no abdominal tenderness.     Hernia: No hernia is present.  Musculoskeletal:     Right lower leg: No edema.     Left lower leg: No edema.  Lymphadenopathy:     Cervical: No cervical adenopathy.     Upper Body:     Right upper body: No supraclavicular, axillary or pectoral adenopathy.     Left upper body: No supraclavicular, axillary or pectoral adenopathy.     Lower Body: No right inguinal adenopathy. No left inguinal adenopathy.  Neurological:     General: No focal deficit present.     Mental Status: He is alert and oriented to person, place, and time.  Psychiatric:        Mood and Affect: Mood normal.        Behavior: Behavior normal.     LABORATORY DATA:  I have reviewed the labs as listed.  CBC Latest Ref Rng & Units 11/05/2020 04/22/2020 10/23/2019  WBC 4.0 - 10.5 K/uL 5.5 4.9 4.6  Hemoglobin 13.0 - 17.0 g/dL 14.4 14.2 14.3  Hematocrit 39.0 - 52.0 % 44.8 43.6 44.6  Platelets 150 - 400 K/uL 146(L) 126(L) 112(L)   CMP Latest Ref Rng & Units 11/05/2020 04/22/2020 10/23/2019  Glucose 70 - 99 mg/dL 86 101(H) 99  BUN 8 - 23 mg/dL 27(H) 23 19  Creatinine 0.61 - 1.24 mg/dL 1.69(H) 1.56(H) 1.53(H)  Sodium 135 - 145 mmol/L 134(L) 137 139  Potassium 3.5 - 5.1 mmol/L 4.2 4.5 4.5  Chloride 98 - 111 mmol/L 102 100 103  CO2 22 - 32 mmol/L _0 Calcium 8.9 - 10.3 mg/dL 9.3 9.6 9.5  Total Protein 6.5 - 8.1 g/dL 7.0 6.6 6.8  Total Bilirubin 0.3 - 1.2 mg/dL 0.6 0.6 0.8  Alkaline Phos 38 - 126 U/L 67 73 67  AST 15 - 41  U/L _1 ALT 0 - 44 U/L _2 Component Value Date/Time   RBC 4.76 11/05/2020 0941   MCV 94.1 11/05/2020 0941   MCH 30.3 11/05/2020 0941   MCHC 32.1 11/05/2020 0941   RDW 14.3 11/05/2020 0941   LYMPHSABS 1.1 11/05/2020 0941   MONOABS 0.6 11/05/2020 0941   EOSABS 0.1 11/05/2020 0941   BASOSABS 0.0 11/05/2020 0941   Lab Results  Component  Value Date   LDH 109 11/05/2020   LDH 94 (L) 04/22/2020   LDH 94 (L) 10/23/2019   Lab Results  Component Value Date   VD25OH 63.88 11/05/2020   VD25OH 50.02 04/22/2020   Lab Results  Component Value Date   TOTALPROTELP 6.6 11/05/2020   ALBUMINELP 4.3 11/05/2020   A1GS 0.2 11/05/2020   A2GS 0.6 11/05/2020   BETS 1.1 11/05/2020   GAMS 0.5 11/05/2020   MSPIKE Not Observed 11/05/2020   SPEI Comment 11/05/2020    Lab Results  Component Value Date   KPAFRELGTCHN 38.2 (H) 11/05/2020   LAMBDASER 20.5 11/05/2020   KAPLAMBRATIO 1.86 (H) 11/05/2020    DIAGNOSTIC IMAGING:  I have independently reviewed the scans and discussed with the patient. No results found.   ASSESSMENT:  1. Low-grade NHL/lymphoplasmacytic lymphoma: -Presentation with severe weakness.  Bone marrow biopsy on 05/06/2016 consistent with low-grade NHL and plasmacytoid differentiation. -6 cycles of bendamustine and rituximab from 08/17/2016 through 02/02/2017, patient refused maintenance rituximab.   PLAN:  1. Low-grade NHL/lymphoplasmacytic lymphoma: -No B symptoms in the last 6 months. -Physical examination did not reveal any palpable adenopathy or splenomegaly. -Reviewed labs from 11/05/2020 which showed normal LDH.  SPEP and free light chains are unchanged. -RTC with 6 months with labs and physical exam.  2. Mild to moderate thrombocytopenia: -Platelet count improved to 146.  3. CKD: -Creatinine is 1.69 and close to baseline.  Recommend plenty of fluids and avoid nephrotoxins.  4. Vertigo: -Continue meclizine as needed.  Orders placed  this encounter:  No orders of the defined types were placed in this encounter.    Derek Jack, MD [place] [phone]   I, Milinda Antis, am acting as a scribe for Dr. Sanda Linger.  I, Derek Jack MD, have reviewed the above documentation for accuracy and completeness, and I agree with the above.

## 2020-11-12 NOTE — Patient Instructions (Signed)
[place] at [facility] Discharge Instructions  You were seen today by Dr. Delton Coombes. He went over your recent results. Drink plenty of water to prevent any more kidney disease. Dr. Delton Coombes will see you back in 6 months for labs and follow up.   Thank you for choosing [place] at [facility] to provide your oncology and hematology care.  To afford each patient quality time with our provider, please arrive at least 15 minutes before your scheduled appointment time.   If you have a lab appointment with the [facility] please come in thru the Main Entrance and check in at the main information desk  You need to re-schedule your appointment should you arrive 10 or more minutes late.  We strive to give you quality time with our providers, and arriving late affects you and other patients whose appointments are after yours.  Also, if you no show three or more times for appointments you may be dismissed from the clinic at the providers discretion.     Again, thank you for choosing [facility].  Our hope is that these requests will decrease the amount of time that you wait before being seen by our physicians.       _____________________________________________________________  Should you have questions after your visit to [facility], please contact our office at [phone] between the hours of 8:00 a.m. and 4:30 p.m.  Voicemails left after 4:00 p.m. will not be returned until the following business day.  For prescription refill requests, have your pharmacy contact our office and allow 72 hours.    Cancer Center Support Programs:   > Cancer Support Group  2nd Tuesday of the month 1pm-2pm, Journey Room

## 2021-03-17 DIAGNOSIS — Z6822 Body mass index (BMI) 22.0-22.9, adult: Secondary | ICD-10-CM | POA: Diagnosis not present

## 2021-03-17 DIAGNOSIS — J208 Acute bronchitis due to other specified organisms: Secondary | ICD-10-CM | POA: Diagnosis not present

## 2021-05-07 ENCOUNTER — Other Ambulatory Visit: Payer: Self-pay

## 2021-05-07 ENCOUNTER — Inpatient Hospital Stay (HOSPITAL_COMMUNITY): Payer: Medicare Other | Attending: Hematology

## 2021-05-07 DIAGNOSIS — C8300 Small cell B-cell lymphoma, unspecified site: Secondary | ICD-10-CM

## 2021-05-07 DIAGNOSIS — C880 Waldenstrom macroglobulinemia: Secondary | ICD-10-CM | POA: Insufficient documentation

## 2021-05-07 LAB — CBC WITH DIFFERENTIAL/PLATELET
Abs Immature Granulocytes: 0.02 10*3/uL (ref 0.00–0.07)
Basophils Absolute: 0 10*3/uL (ref 0.0–0.1)
Basophils Relative: 0 %
Eosinophils Absolute: 0.2 10*3/uL (ref 0.0–0.5)
Eosinophils Relative: 3 %
HCT: 41.7 % (ref 39.0–52.0)
Hemoglobin: 13.5 g/dL (ref 13.0–17.0)
Immature Granulocytes: 0 %
Lymphocytes Relative: 19 %
Lymphs Abs: 1.2 10*3/uL (ref 0.7–4.0)
MCH: 30.1 pg (ref 26.0–34.0)
MCHC: 32.4 g/dL (ref 30.0–36.0)
MCV: 93.1 fL (ref 80.0–100.0)
Monocytes Absolute: 0.6 10*3/uL (ref 0.1–1.0)
Monocytes Relative: 10 %
Neutro Abs: 4.1 10*3/uL (ref 1.7–7.7)
Neutrophils Relative %: 68 %
Platelets: 143 10*3/uL — ABNORMAL LOW (ref 150–400)
RBC: 4.48 MIL/uL (ref 4.22–5.81)
RDW: 14.1 % (ref 11.5–15.5)
WBC: 6.1 10*3/uL (ref 4.0–10.5)
nRBC: 0 % (ref 0.0–0.2)

## 2021-05-07 LAB — COMPREHENSIVE METABOLIC PANEL
ALT: 15 U/L (ref 0–44)
AST: 17 U/L (ref 15–41)
Albumin: 4.4 g/dL (ref 3.5–5.0)
Alkaline Phosphatase: 66 U/L (ref 38–126)
Anion gap: 7 (ref 5–15)
BUN: 24 mg/dL — ABNORMAL HIGH (ref 8–23)
CO2: 28 mmol/L (ref 22–32)
Calcium: 9.6 mg/dL (ref 8.9–10.3)
Chloride: 101 mmol/L (ref 98–111)
Creatinine, Ser: 1.51 mg/dL — ABNORMAL HIGH (ref 0.61–1.24)
GFR, Estimated: 44 mL/min — ABNORMAL LOW (ref >60)
Glucose, Bld: 96 mg/dL (ref 70–99)
Potassium: 4.6 mmol/L (ref 3.5–5.1)
Sodium: 136 mmol/L (ref 135–145)
Total Bilirubin: 0.6 mg/dL (ref 0.3–1.2)
Total Protein: 6.4 g/dL — ABNORMAL LOW (ref 6.5–8.1)

## 2021-05-07 LAB — LACTATE DEHYDROGENASE: LDH: 102 U/L (ref 98–192)

## 2021-05-08 LAB — PROTEIN ELECTROPHORESIS, SERUM
A/G Ratio: 1.9 — ABNORMAL HIGH (ref 0.7–1.7)
Albumin ELP: 3.9 g/dL (ref 2.9–4.4)
Alpha-1-Globulin: 0.2 g/dL (ref 0.0–0.4)
Alpha-2-Globulin: 0.5 g/dL (ref 0.4–1.0)
Beta Globulin: 0.9 g/dL (ref 0.7–1.3)
Gamma Globulin: 0.4 g/dL (ref 0.4–1.8)
Globulin, Total: 2.1 g/dL — ABNORMAL LOW (ref 2.2–3.9)
M-Spike, %: 0.1 g/dL — ABNORMAL HIGH
Total Protein ELP: 6 g/dL (ref 6.0–8.5)

## 2021-05-08 LAB — KAPPA/LAMBDA LIGHT CHAINS
Kappa free light chain: 43.3 mg/L — ABNORMAL HIGH (ref 3.3–19.4)
Kappa, lambda light chain ratio: 2.33 — ABNORMAL HIGH (ref 0.26–1.65)
Lambda free light chains: 18.6 mg/L (ref 5.7–26.3)

## 2021-05-14 ENCOUNTER — Ambulatory Visit (HOSPITAL_COMMUNITY): Payer: Medicare Other | Admitting: Hematology and Oncology

## 2021-05-15 ENCOUNTER — Encounter (HOSPITAL_COMMUNITY): Payer: Self-pay | Admitting: Hematology and Oncology

## 2021-05-15 ENCOUNTER — Inpatient Hospital Stay (HOSPITAL_COMMUNITY): Payer: Medicare Other | Attending: Hematology | Admitting: Hematology and Oncology

## 2021-05-15 ENCOUNTER — Other Ambulatory Visit: Payer: Self-pay

## 2021-05-15 VITALS — BP 134/73 | HR 73 | Temp 98.1°F | Resp 18 | Wt 160.7 lb

## 2021-05-15 DIAGNOSIS — C8300 Small cell B-cell lymphoma, unspecified site: Secondary | ICD-10-CM | POA: Diagnosis not present

## 2021-05-15 DIAGNOSIS — D472 Monoclonal gammopathy: Secondary | ICD-10-CM | POA: Diagnosis not present

## 2021-05-15 DIAGNOSIS — N189 Chronic kidney disease, unspecified: Secondary | ICD-10-CM | POA: Insufficient documentation

## 2021-05-15 DIAGNOSIS — C8330 Diffuse large B-cell lymphoma, unspecified site: Secondary | ICD-10-CM | POA: Diagnosis present

## 2021-05-15 NOTE — Progress Notes (Signed)
Paul Keith, [place] [id number]   CLINIC:  Medical Oncology/Hematology  PCP:  Scotty Court, DO [address] / [place] [address]  [phone]  REASON FOR VISIT:  Follow-up for low-grade non-Hodgkin's lymphoma  PRIOR THERAPY: Bendamustine and rituximab x 6 cycles from 08/17/2016 to 02/02/2017  CURRENT THERAPY: Observation  INTERVAL HISTORY:   [name], a 87 y.o. male, returns for routine follow-up for his low-grade non-Hodgkin's lymphoma.  Patient is here for a follow-up by himself.  He has been doing really well.  He denies any Paul health complaints.  No fevers, drenching night sweats, loss of appetite or loss of weight.  No change in breathing, bowel habits or urinary habits.  He feels very well except for polyuria and nocturia.  He is scheduled to see his PCP next week to discuss about this. 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[place] Keith) 05/21/2016   Pancytopenia ([place]) 04/22/2016   Past Surgical History:  Procedure Laterality Date   CATARACT EXTRACTION Bilateral    TONSILLECTOMY      SOCIAL HISTORY:  Social History   Socioeconomic History   Marital status: Widowed    Spouse name: Not on file   Number of children: Not on file   Years of education: Not on file   Highest education level: Not on file  Occupational History   Not on file  Tobacco Use   Smoking status: Former    Pack years: 0.00    Types: Cigarettes    Quit date: 09/15/1962    Years since quitting: 58.7   Smokeless tobacco: Never  Substance and Sexual Activity   Alcohol use: Yes    Comment: OCCASSIONAL   Drug use: No   Sexual activity: Not on file     Comment: married  Other Topics Concern   Not on file  Social History Narrative   Not on file   Social Determinants of Health   Financial Resource Strain: Low Risk    Difficulty of Paying Living Expenses: Not very hard  Food Insecurity: No Food Insecurity   Worried About Charity fundraiser in the Last Year: Never true   [place] in the Last Year: Never true  Transportation Needs: No Transportation Needs   Lack of Transportation (Medical): No   Lack of Transportation (Non-Medical): No  Physical Activity: Inactive   Days of Exercise per Week: 0 days   Minutes of Exercise per Session: 0 min  Stress: No Stress Concern Present   Feeling of Stress : Not at all  Social Connections: Moderately Isolated   Frequency of Communication with Friends and Family: More than three times a week   Frequency of Social Gatherings with Friends and Family: Three times a week   Attends Religious Services: 1 to 4 times per year   Active Member of Clubs or Organizations: No   Attends Archivist Meetings: Never   Marital Status: Widowed  Human resources officer Violence: Not At Risk   Fear of Current or Ex-Partner: No   Emotionally Abused: No   Physically Abused: No   Sexually Abused: No    FAMILY HISTORY:  History reviewed. No pertinent  family history.  CURRENT MEDICATIONS:  Current Outpatient Medications  Medication Sig Dispense Refill   acetaminophen (TYLENOL) 500 MG tablet Take 500 mg by mouth every 6 (six) hours as needed for mild pain.     B Complex-C (B-COMPLEX WITH VITAMIN C) tablet Take 1 tablet by mouth daily.     cholecalciferol (VITAMIN D) 1000 units tablet Take 1,000 Units by mouth daily.     meclizine (ANTIVERT) 25 MG tablet Take 1 tablet (25 mg total) by mouth 3 (three) times daily as needed for dizziness. 30 tablet 1   Multiple Vitamin (MULTIVITAMIN WITH MINERALS) TABS tablet Take 1 tablet by mouth daily.     sildenafil (REVATIO) 20 MG tablet TAKE 3-5 TABLETS BY MOUTH AS  NEEDED.     tamsulosin (FLOMAX) 0.4 MG CAPS capsule Take 0.8 mg by mouth daily after supper.      cyanocobalamin 100 MCG tablet Take by mouth daily. Pt reports unsure of dosage, takes once daily (Patient not taking: Reported on 05/15/2021)     No current facility-administered medications for this visit.    ALLERGIES:  No Known Allergies  PHYSICAL EXAM:  Performance status (ECOG): 1 - Symptomatic but completely ambulatory  Vitals:   05/15/21 1057  BP: 134/73  Pulse: 73  Resp: 18  Temp: 98.1 F (36.7 C)  SpO2: 100%   Wt Readings from Last 3 Encounters:  05/15/21 160 lb 11.2 oz (72.9 kg)  11/12/20 165 lb 8 oz (75.1 kg)  04/29/20 161 lb 12.8 oz (73.4 kg)   Physical Exam Constitutional:      Appearance: Normal appearance. He is normal weight.  HENT:     Head: Normocephalic and atraumatic.  Cardiovascular:     Rate and Rhythm: Normal rate and regular rhythm.  Pulmonary:     Effort: Pulmonary effort is normal.     Breath sounds: Normal breath sounds.  Abdominal:     General: Abdomen is flat. Bowel sounds are normal.     Palpations: Abdomen is soft.  Musculoskeletal:        General: Normal range of motion.     Cervical back: Normal range of motion and neck supple. No rigidity.  Lymphadenopathy:     Cervical: No cervical adenopathy.  Skin:    General: Skin is warm and dry.  Neurological:     General: No focal deficit present.     Mental Status: He is alert.  Psychiatric:        Mood and Affect: Mood normal.    LABORATORY DATA:  I have reviewed the labs as listed.  CBC Latest Ref Rng & Units 05/07/2021 11/05/2020 04/22/2020  WBC 4.0 - 10.5 K/uL 6.1 5.5 4.9  Hemoglobin 13.0 - 17.0 g/dL 13.5 14.4 14.2  Hematocrit 39.0 - 52.0 % 41.7 44.8 43.6  Platelets 150 - 400 K/uL 143(L) 146(L) 126(L)   CMP Latest Ref Rng & Units 05/07/2021 11/05/2020 04/22/2020  Glucose 70 - 99 mg/dL 96 86 101(H)  BUN 8 - 23 mg/dL 24(H) 27(H) 23  Creatinine 0.61 - 1.24 mg/dL 1.51(H) 1.69(H) 1.56(H)   Sodium 135 - 145 mmol/L 136 134(L) 137  Potassium 3.5 - 5.1 mmol/L 4.6 4.2 4.5  Chloride 98 - 111 mmol/L 101 102 100  CO2 22 - 32 mmol/L _0 Calcium 8.9 - 10.3 mg/dL 9.6 9.3 9.6  Total Protein 6.5 - 8.1 g/dL 6.4(L) 7.0 6.6  Total Bilirubin 0.3 - 1.2 mg/dL 0.6 0.6 0.6  Alkaline Phos 38 - 126 U/L 66 67  73  AST 15 - 41 U/L _0 ALT 0 - 44 U/L _1 Component Value Date/Time   RBC 4.48 05/07/2021 1044   MCV 93.1 05/07/2021 1044   MCH 30.1 05/07/2021 1044   MCHC 32.4 05/07/2021 1044   RDW 14.1 05/07/2021 1044   LYMPHSABS 1.2 05/07/2021 1044   MONOABS 0.6 05/07/2021 1044   EOSABS 0.2 05/07/2021 1044   BASOSABS 0.0 05/07/2021 1044   Lab Results  Component Value Date   LDH 102 05/07/2021   LDH 109 11/05/2020   LDH 94 (L) 04/22/2020   Lab Results  Component Value Date   VD25OH 63.88 11/05/2020   VD25OH 50.02 04/22/2020   Lab Results  Component Value Date   TOTALPROTELP 6.0 05/07/2021   ALBUMINELP 3.9 05/07/2021   A1GS 0.2 05/07/2021   A2GS 0.5 05/07/2021   BETS 0.9 05/07/2021   GAMS 0.4 05/07/2021   MSPIKE 0.1 (H) 05/07/2021   SPEI Comment 05/07/2021    Lab Results  Component Value Date   KPAFRELGTCHN 43.3 (H) 05/07/2021   LAMBDASER 18.6 05/07/2021   KAPLAMBRATIO 2.33 (H) 05/07/2021    DIAGNOSTIC IMAGING:  I have independently reviewed the scans and discussed with the patient. No results found.   ASSESSMENT:   1.  Low-grade NHL/lymphoplasmacytic lymphoma: -Presentation with severe weakness.  Bone marrow biopsy on 05/06/2016 consistent with low-grade NHL and plasmacytoid differentiation. -6 cycles of bendamustine and rituximab from 08/17/2016 through 02/02/2017, patient refused maintenance rituximab.   PLAN:   1.  Low-grade NHL/lymphoplasmacytic lymphoma:  -No B symptoms in the last 6 months. -Physical examination did not reveal any palpable adenopathy or splenomegaly. -Reviewed labs, no concern for recurrence.   2.  Mild to moderate  thrombocytopenia: -Platelet count improved to 143   3.  CKD: -Creatinine is 1.5 and close to baseline.  Recommend plenty of fluids and avoid nephrotoxins.   4.  Vertigo: -Continue meclizine as needed.  5.  MGUS, monoclonal protein measuring 0.1 g/dL noted, faint band suspicious for monoclonal immunoglobulin may represent a benign spike as seen as in older people or could be a paraprotein.  Kappa lambda ratio elevated at 2.33.  We will repeat these labs in 6 months.  Orders placed this encounter:  No orders of the defined types were placed in this encounter.  Benay Pike MD

## 2021-05-20 ENCOUNTER — Other Ambulatory Visit (HOSPITAL_COMMUNITY): Payer: Self-pay

## 2021-05-20 DIAGNOSIS — C8300 Small cell B-cell lymphoma, unspecified site: Secondary | ICD-10-CM

## 2021-05-25 DIAGNOSIS — Z6821 Body mass index (BMI) 21.0-21.9, adult: Secondary | ICD-10-CM | POA: Diagnosis not present

## 2021-05-25 DIAGNOSIS — Z125 Encounter for screening for malignant neoplasm of prostate: Secondary | ICD-10-CM | POA: Diagnosis not present

## 2021-05-25 DIAGNOSIS — J208 Acute bronchitis due to other specified organisms: Secondary | ICD-10-CM | POA: Diagnosis not present

## 2021-05-25 DIAGNOSIS — R55 Syncope and collapse: Secondary | ICD-10-CM | POA: Diagnosis not present

## 2021-06-09 DIAGNOSIS — R55 Syncope and collapse: Secondary | ICD-10-CM | POA: Diagnosis not present

## 2021-06-09 DIAGNOSIS — I6523 Occlusion and stenosis of bilateral carotid arteries: Secondary | ICD-10-CM | POA: Diagnosis not present

## 2021-06-17 DIAGNOSIS — M25561 Pain in right knee: Secondary | ICD-10-CM | POA: Diagnosis not present

## 2021-06-17 DIAGNOSIS — M47814 Spondylosis without myelopathy or radiculopathy, thoracic region: Secondary | ICD-10-CM | POA: Diagnosis not present

## 2021-06-17 DIAGNOSIS — Z6821 Body mass index (BMI) 21.0-21.9, adult: Secondary | ICD-10-CM | POA: Diagnosis not present

## 2021-06-17 DIAGNOSIS — M545 Low back pain, unspecified: Secondary | ICD-10-CM | POA: Diagnosis not present

## 2021-06-17 DIAGNOSIS — N400 Enlarged prostate without lower urinary tract symptoms: Secondary | ICD-10-CM | POA: Diagnosis not present

## 2021-06-17 DIAGNOSIS — M5431 Sciatica, right side: Secondary | ICD-10-CM | POA: Diagnosis not present

## 2021-06-17 DIAGNOSIS — M1710 Unilateral primary osteoarthritis, unspecified knee: Secondary | ICD-10-CM | POA: Diagnosis not present

## 2021-06-17 DIAGNOSIS — M25461 Effusion, right knee: Secondary | ICD-10-CM | POA: Diagnosis not present

## 2021-06-17 DIAGNOSIS — M2578 Osteophyte, vertebrae: Secondary | ICD-10-CM | POA: Diagnosis not present

## 2021-06-17 DIAGNOSIS — M546 Pain in thoracic spine: Secondary | ICD-10-CM | POA: Diagnosis not present

## 2021-06-23 NOTE — Progress Notes (Signed)
06/24/21 9:39 AM   Paul Keith 1930-03-02 [id number]  Referring provider:  Scotty Court, DO [address] [place],  [place] [address] Chief Complaint  Patient presents with   Benign Prostatic Hypertrophy     HPI: Paul Keith is a 87 y.o.male who was referred today for an enlarged prostate and worsening/chronic urinary issues.  Patient was seen on 06/17/2021 by Dr. Lutricia Horsfall with reports of frequent urination and BPH without LUTS. BPH despite Flomax, he continues to have refractory and worsening urinary symptoms, primarily urinary frequency but also weak stream and was referred for further evaluation of this.  PSA on July 2022 was 3.9.  Patient was accompanied by his son.    Patient states he has been taking Flomax for about 5 years and he has not been seeing any relief.   Patient states he is most bothered by his nocturia. He gets up about ever hour to urinate in small amounts. He states he often has to push to urinate.   IPSS as below.    IPSS     Row Name 06/24/21 0900         International Prostate Symptom Score   How often have you had the sensation of not emptying your bladder? Less than half the time     How often have you had to urinate less than every two hours? Almost always     How often have you found you stopped and started again several times when you urinated? About half the time     How often have you found it difficult to postpone urination? Less than half the time     How often have you had a weak urinary stream? More than half the time     How often have you had to strain to start urination? Not at All     How many times did you typically get up at night to urinate? 5 Times     Total IPSS Score 21           Quality of Life due to urinary symptoms     If you were to spend the rest of your life with your urinary condition just the way it is now how would you feel about that? 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 If you have any questions, ask your nurse or doctor.          acetaminophen 500 MG tablet Commonly known as: TYLENOL Take 500 mg by mouth every 6 (six) hours as needed for mild pain.   B-complex with vitamin C tablet Take 1 tablet by mouth daily.   cholecalciferol 1000 units tablet Commonly known as: VITAMIN D Take 1,000 Units by mouth daily.   cyanocobalamin 100 MCG  tablet Take by mouth daily. Pt reports unsure of dosage, takes once daily   diclofenac Sodium 1 % Gel Commonly known as: VOLTAREN SMARTSIG:Gram(s) Topical   meclizine 25 MG tablet Commonly known as: ANTIVERT Take 1 tablet (25 mg total) by mouth 3 (three) times daily as needed for dizziness.   methylPREDNISolone 4 MG Tbpk tablet Commonly known as: MEDROL DOSEPAK Take by mouth.   multivitamin with minerals Tabs tablet Take 1 tablet by mouth daily.   rosuvastatin 20 MG tablet Commonly known as: CRESTOR Take 20 mg by mouth at bedtime.   sildenafil 20 MG tablet Commonly known as: REVATIO TAKE 3-5 TABLETS BY MOUTH AS NEEDED.   tamsulosin 0.4 MG Caps capsule Commonly known as: FLOMAX Take 0.8 mg by mouth daily after supper.        Allergies: No Known Allergies  Family History: No family history on file.  Social History:  reports that he quit smoking about 58 years ago. His smoking use included cigarettes. He  has never used smokeless tobacco. He reports current alcohol use. He reports that he does not use drugs.   Physical Exam: BP (!) 168/84   Pulse 79   Ht 6' (1.829 m)   Wt 155 lb (70.3 kg)   BMI 21.02 kg/m   Constitutional:  Alert and oriented, No acute distress.  Appears younger than stated age. HEENT: [place] AT, moist mucus membranes.  Trachea midline, no masses. Cardiovascular: No clubbing, cyanosis, or edema. Respiratory: Normal respiratory effort, no increased work of breathing. Rectal exam deferred based on patient's age and normal PSA Skin: No rashes, bruises or suspicious lesions. Neurologic: Grossly intact, no focal deficits, moving all 4 extremities. Psychiatric: Normal mood and affect.  Laboratory Data:  Lab Results  Component Value Date   CREATININE 1.51 (H) 05/07/2021     Pertinent Imaging: Results for orders placed or performed in visit on 06/24/21  Bladder Scan (Post Void Residual) in office  Result Value Ref Range   Scan Result 111      Assessment & Plan:    BPH with urinary frequency  -Refractory urinary symptoms despite Flomax which are both storage elated and obstructive in nature - Recommend cystoscopy and a TRUS to help evaluate prostatic anatomy and whether or not he is a candidate for outlet procedure such - IPSS score today was 21 which is severe  - PVR today was 111 mL  -Behavioral modification discussed primarily for nocturia type symptoms, avoidance of beverages 4 hours prior to bed - Prescribed finasteride to optimize pharmical therapy.    Follow-up in 6 weeks for cystoscopy and TRUS   I,Kailey Littlejohn,acting as a scribe for Hollice Espy, MD.,have documented all relevant documentation on the behalf of Hollice Espy, MD,as directed by  Hollice Espy, MD while in the presence of Hollice Espy, [place] [address], [place],  [id number] [phone]

## 2021-06-24 ENCOUNTER — Encounter: Payer: Self-pay | Admitting: Urology

## 2021-06-24 ENCOUNTER — Other Ambulatory Visit: Payer: Self-pay

## 2021-06-24 ENCOUNTER — Ambulatory Visit: Payer: Medicare Other | Admitting: Urology

## 2021-06-24 VITALS — BP 168/84 | HR 79 | Ht 72.0 in | Wt 155.0 lb

## 2021-06-24 DIAGNOSIS — N400 Enlarged prostate without lower urinary tract symptoms: Secondary | ICD-10-CM

## 2021-06-24 DIAGNOSIS — R350 Frequency of micturition: Secondary | ICD-10-CM | POA: Diagnosis not present

## 2021-06-24 LAB — BLADDER SCAN AMB NON-IMAGING: Scan Result: 111

## 2021-06-24 MED ORDER — FINASTERIDE 5 MG PO TABS: 5.0000 mg | ORAL_TABLET | Freq: Every day | ORAL | 3 refills | Status: DC

## 2021-06-24 NOTE — Patient Instructions (Signed)
Cystoscopy Cystoscopy is a procedure that is used to help diagnose and sometimes treat conditions that affect the lower urinary tract. The lower urinary tract includes the bladder and the urethra. The urethra is the tube that drains urine from the bladder. Cystoscopy is done using a thin, tube-shaped instrument with a light and camera at the end (cystoscope). The cystoscope may be hard or flexible, depending on the goal of the procedure. The cystoscope is inserted through the urethra, into the bladder. Cystoscopy may be recommended if you have: Urinary tract infections that keep coming back. Blood in the urine (hematuria). An inability to control when you urinate (urinary incontinence) or an overactive bladder. Unusual cells found in a urine sample. A blockage in the urethra, such as a urinary stone. Painful urination. An abnormality in the bladder found during an intravenous pyelogram (IVP) or CT scan. Cystoscopy may also be done to remove a sample of tissue to be examined under a microscope (biopsy). What are the risks? Generally, this is a safe procedure. However, problems may occur, including: Infection. Bleeding.  What happens during the procedure?  You will be given one or more of the following: A medicine to numb the area (local anesthetic). The area around the opening of your urethra will be cleaned. The cystoscope will be passed through your urethra into your bladder. Germ-free (sterile) fluid will flow through the cystoscope to fill your bladder. The fluid will stretch your bladder so that your health care provider can clearly examine your bladder walls. Your doctor will look at the urethra and bladder. The cystoscope will be removed The procedure may vary among health care providers  What can I expect after the procedure? After the procedure, it is common to have: Some soreness or pain in your abdomen and urethra. Urinary symptoms. These include: Mild pain or burning when you  urinate. Pain should stop within a few minutes after you urinate. This may last for up to 1 week. A small amount of blood in your urine for several days. Feeling like you need to urinate but producing only a small amount of urine. Follow these instructions at home: General instructions Return to your normal activities as told by your health care provider.  Do not drive for 24 hours if you were given a sedative during your procedure. Watch for any blood in your urine. If the amount of blood in your urine increases, call your health care provider. If a tissue sample was removed for testing (biopsy) during your procedure, it is up to you to get your test results. Ask your health care provider, or the department that is doing the test, when your results will be ready. Drink enough fluid to keep your urine pale yellow. Keep all follow-up visits as told by your health care provider. This is important. Contact a health care provider if you: Have pain that gets worse or does not get better with medicine, especially pain when you urinate. Have trouble urinating. Have more blood in your urine. Get help right away if you: Have blood clots in your urine. Have abdominal pain. Have a fever or chills. Are unable to urinate. Summary Cystoscopy is a procedure that is used to help diagnose and sometimes treat conditions that affect the lower urinary tract. Cystoscopy is done using a thin, tube-shaped instrument with a light and camera at the end. After the procedure, it is common to have some soreness or pain in your abdomen and urethra. Watch for any blood in your urine.   If the amount of blood in your urine increases, call your health care provider. If you were prescribed an antibiotic medicine, take it as told by your health care provider. Do not stop taking the antibiotic even if you start to feel better. This information is not intended to replace advice given to you by your health care provider. Make  sure you discuss any questions you have with your health care provider. Document Revised: 10/24/2018 Document Reviewed: 10/24/2018 Elsevier Patient Education  2020 [employer]  

## 2021-06-26 DIAGNOSIS — I739 Peripheral vascular disease, unspecified: Secondary | ICD-10-CM | POA: Diagnosis not present

## 2021-07-09 DIAGNOSIS — Z6820 Body mass index (BMI) 20.0-20.9, adult: Secondary | ICD-10-CM | POA: Diagnosis not present

## 2021-07-09 DIAGNOSIS — M1710 Unilateral primary osteoarthritis, unspecified knee: Secondary | ICD-10-CM | POA: Diagnosis not present

## 2021-07-09 DIAGNOSIS — M5431 Sciatica, right side: Secondary | ICD-10-CM | POA: Diagnosis not present

## 2021-07-09 DIAGNOSIS — N400 Enlarged prostate without lower urinary tract symptoms: Secondary | ICD-10-CM | POA: Diagnosis not present

## 2021-07-12 DIAGNOSIS — R55 Syncope and collapse: Secondary | ICD-10-CM | POA: Diagnosis not present

## 2021-07-12 DIAGNOSIS — S060X1A Concussion with loss of consciousness of 30 minutes or less, initial encounter: Secondary | ICD-10-CM | POA: Diagnosis not present

## 2021-07-12 DIAGNOSIS — W1839XA Other fall on same level, initial encounter: Secondary | ICD-10-CM | POA: Diagnosis not present

## 2021-07-12 DIAGNOSIS — I452 Bifascicular block: Secondary | ICD-10-CM | POA: Diagnosis not present

## 2021-07-12 DIAGNOSIS — R220 Localized swelling, mass and lump, head: Secondary | ICD-10-CM | POA: Diagnosis not present

## 2021-07-12 DIAGNOSIS — C8590 Non-Hodgkin lymphoma, unspecified, unspecified site: Secondary | ICD-10-CM | POA: Diagnosis not present

## 2021-07-12 DIAGNOSIS — Z043 Encounter for examination and observation following other accident: Secondary | ICD-10-CM | POA: Diagnosis not present

## 2021-07-12 DIAGNOSIS — Z87891 Personal history of nicotine dependence: Secondary | ICD-10-CM | POA: Diagnosis not present

## 2021-07-12 DIAGNOSIS — R9431 Abnormal electrocardiogram [ECG] [EKG]: Secondary | ICD-10-CM | POA: Diagnosis not present

## 2021-07-12 DIAGNOSIS — M47812 Spondylosis without myelopathy or radiculopathy, cervical region: Secondary | ICD-10-CM | POA: Diagnosis not present

## 2021-07-12 DIAGNOSIS — M19042 Primary osteoarthritis, left hand: Secondary | ICD-10-CM | POA: Diagnosis not present

## 2021-07-12 DIAGNOSIS — Z20822 Contact with and (suspected) exposure to covid-19: Secondary | ICD-10-CM | POA: Diagnosis not present

## 2021-07-12 DIAGNOSIS — M109 Gout, unspecified: Secondary | ICD-10-CM | POA: Diagnosis not present

## 2021-07-12 DIAGNOSIS — S6992XA Unspecified injury of left wrist, hand and finger(s), initial encounter: Secondary | ICD-10-CM | POA: Diagnosis not present

## 2021-07-14 ENCOUNTER — Encounter: Payer: Self-pay | Admitting: Urology

## 2021-07-22 DIAGNOSIS — M5431 Sciatica, right side: Secondary | ICD-10-CM | POA: Diagnosis not present

## 2021-07-22 DIAGNOSIS — M545 Low back pain, unspecified: Secondary | ICD-10-CM | POA: Diagnosis not present

## 2021-07-27 ENCOUNTER — Other Ambulatory Visit: Payer: Self-pay | Admitting: Internal Medicine

## 2021-07-27 DIAGNOSIS — M545 Low back pain, unspecified: Secondary | ICD-10-CM

## 2021-07-27 DIAGNOSIS — G8929 Other chronic pain: Secondary | ICD-10-CM

## 2021-07-31 DIAGNOSIS — M25561 Pain in right knee: Secondary | ICD-10-CM | POA: Diagnosis not present

## 2021-08-03 DIAGNOSIS — M25561 Pain in right knee: Secondary | ICD-10-CM | POA: Insufficient documentation

## 2021-08-06 ENCOUNTER — Ambulatory Visit
Admission: RE | Admit: 2021-08-06 | Discharge: 2021-08-06 | Disposition: A | Discharge status: Home / Self Care | Payer: Medicare Other | Source: Ambulatory Visit | Attending: Internal Medicine | Admitting: Internal Medicine

## 2021-08-06 ENCOUNTER — Other Ambulatory Visit: Payer: Medicare Other

## 2021-08-06 ENCOUNTER — Other Ambulatory Visit: Payer: Self-pay

## 2021-08-06 DIAGNOSIS — M545 Low back pain, unspecified: Secondary | ICD-10-CM

## 2021-08-06 DIAGNOSIS — G8929 Other chronic pain: Secondary | ICD-10-CM

## 2021-08-06 MED ORDER — METHYLPREDNISOLONE ACETATE 40 MG/ML INJ SUSP (RADIOLOG
80.0000 mg | Freq: Once | INTRAMUSCULAR | Status: AC
  Administered 2021-08-06: 80 mg via EPIDURAL

## 2021-08-06 MED ORDER — IOPAMIDOL (ISOVUE-M 200) INJECTION 41%
1.0000 mL | Freq: Once | INTRAMUSCULAR | Status: AC
  Administered 2021-08-06: 1 mL via EPIDURAL

## 2021-08-06 NOTE — Discharge Instructions (Signed)

## 2021-08-11 ENCOUNTER — Encounter: Payer: Self-pay | Admitting: Urology

## 2021-08-18 DIAGNOSIS — Z681 Body mass index (BMI) 19 or less, adult: Secondary | ICD-10-CM | POA: Diagnosis not present

## 2021-08-18 DIAGNOSIS — B028 Zoster with other complications: Secondary | ICD-10-CM | POA: Diagnosis not present

## 2021-08-18 DIAGNOSIS — K4091 Unilateral inguinal hernia, without obstruction or gangrene, recurrent: Secondary | ICD-10-CM | POA: Diagnosis not present

## 2021-08-24 DIAGNOSIS — M5430 Sciatica, unspecified side: Secondary | ICD-10-CM | POA: Diagnosis not present

## 2021-08-24 DIAGNOSIS — B028 Zoster with other complications: Secondary | ICD-10-CM | POA: Diagnosis not present

## 2021-08-24 DIAGNOSIS — Z681 Body mass index (BMI) 19 or less, adult: Secondary | ICD-10-CM | POA: Diagnosis not present

## 2021-09-03 DIAGNOSIS — R4182 Altered mental status, unspecified: Secondary | ICD-10-CM | POA: Diagnosis not present

## 2021-09-03 DIAGNOSIS — H532 Diplopia: Secondary | ICD-10-CM | POA: Diagnosis not present

## 2021-09-03 DIAGNOSIS — R9431 Abnormal electrocardiogram [ECG] [EKG]: Secondary | ICD-10-CM | POA: Diagnosis not present

## 2021-09-03 DIAGNOSIS — M79605 Pain in left leg: Secondary | ICD-10-CM | POA: Diagnosis not present

## 2021-09-03 DIAGNOSIS — M79604 Pain in right leg: Secondary | ICD-10-CM | POA: Diagnosis not present

## 2021-09-03 DIAGNOSIS — G319 Degenerative disease of nervous system, unspecified: Secondary | ICD-10-CM | POA: Diagnosis not present

## 2021-09-03 DIAGNOSIS — G7000 Myasthenia gravis without (acute) exacerbation: Secondary | ICD-10-CM | POA: Diagnosis not present

## 2021-09-03 DIAGNOSIS — M4854XA Collapsed vertebra, not elsewhere classified, thoracic region, initial encounter for fracture: Secondary | ICD-10-CM | POA: Diagnosis not present

## 2021-09-03 DIAGNOSIS — R519 Headache, unspecified: Secondary | ICD-10-CM | POA: Diagnosis not present

## 2021-09-03 DIAGNOSIS — M542 Cervicalgia: Secondary | ICD-10-CM | POA: Diagnosis not present

## 2021-09-03 DIAGNOSIS — D649 Anemia, unspecified: Secondary | ICD-10-CM | POA: Diagnosis not present

## 2021-09-03 DIAGNOSIS — R9082 White matter disease, unspecified: Secondary | ICD-10-CM | POA: Diagnosis not present

## 2021-09-03 DIAGNOSIS — M4856XA Collapsed vertebra, not elsewhere classified, lumbar region, initial encounter for fracture: Secondary | ICD-10-CM | POA: Diagnosis not present

## 2021-09-03 DIAGNOSIS — W01198A Fall on same level from slipping, tripping and stumbling with subsequent striking against other object, initial encounter: Secondary | ICD-10-CM | POA: Diagnosis not present

## 2021-09-03 DIAGNOSIS — M545 Low back pain, unspecified: Secondary | ICD-10-CM | POA: Diagnosis not present

## 2021-09-03 DIAGNOSIS — Z043 Encounter for examination and observation following other accident: Secondary | ICD-10-CM | POA: Diagnosis not present

## 2021-09-03 DIAGNOSIS — N189 Chronic kidney disease, unspecified: Secondary | ICD-10-CM | POA: Diagnosis not present

## 2021-09-03 DIAGNOSIS — R531 Weakness: Secondary | ICD-10-CM | POA: Diagnosis not present

## 2021-09-03 DIAGNOSIS — I6789 Other cerebrovascular disease: Secondary | ICD-10-CM | POA: Diagnosis not present

## 2021-09-03 DIAGNOSIS — I452 Bifascicular block: Secondary | ICD-10-CM | POA: Diagnosis not present

## 2021-09-03 DIAGNOSIS — D696 Thrombocytopenia, unspecified: Secondary | ICD-10-CM | POA: Diagnosis not present

## 2021-09-04 ENCOUNTER — Ambulatory Visit: Payer: Medicare Other | Admitting: Urology

## 2021-09-04 DIAGNOSIS — N400 Enlarged prostate without lower urinary tract symptoms: Secondary | ICD-10-CM

## 2021-09-04 DIAGNOSIS — R350 Frequency of micturition: Secondary | ICD-10-CM

## 2021-09-07 ENCOUNTER — Other Ambulatory Visit: Payer: Self-pay

## 2021-09-07 ENCOUNTER — Ambulatory Visit: Payer: Medicare Other | Admitting: Urology

## 2021-09-07 ENCOUNTER — Encounter: Payer: Self-pay | Admitting: Urology

## 2021-09-07 VITALS — Wt 137.0 lb

## 2021-09-07 DIAGNOSIS — R350 Frequency of micturition: Secondary | ICD-10-CM | POA: Diagnosis not present

## 2021-09-07 DIAGNOSIS — H4903 Third [oculomotor] nerve palsy, bilateral: Secondary | ICD-10-CM | POA: Diagnosis not present

## 2021-09-07 DIAGNOSIS — N400 Enlarged prostate without lower urinary tract symptoms: Secondary | ICD-10-CM

## 2021-09-07 LAB — BLADDER SCAN AMB NON-IMAGING: Scan Result: 132

## 2021-09-07 MED ORDER — ALFUZOSIN HCL ER 10 MG PO TB24: 10.0000 mg | ORAL_TABLET | Freq: Every day | ORAL | 11 refills | Status: DC

## 2021-09-07 NOTE — Progress Notes (Signed)
Urological Symptom Review  Patient is experiencing the following symptoms: Frequent urination Get up at night to urinate Leakage of urine   Review of Systems  Gastrointestinal (upper)  : Negative for upper GI symptoms  Gastrointestinal (lower) : Diarrhea  Constitutional : Weight loss  Skin: Negative for skin symptoms  Eyes: Negative for eye symptoms  Ear/Nose/Throat : Negative for Ear/Nose/Throat symptoms  Hematologic/Lymphatic: Negative for Hematologic/Lymphatic symptoms  Cardiovascular : Negative for cardiovascular symptoms  Respiratory : Negative for respiratory symptoms  Endocrine: Negative for endocrine symptoms  Musculoskeletal: Back pain  Neurological: Negative for neurological symptoms  Psychologic: Negative for psychiatric symptoms

## 2021-09-07 NOTE — Progress Notes (Signed)
post void residual=132 

## 2021-09-07 NOTE — Progress Notes (Signed)
09/07/2021 10:32 AM   Paul Keith 08-09-1930 [id number]  Referring provider: Neale Burly, MD [place],  [place] [id number]  Nocturia   HPI: Paul Keith is 87yo her for evaluation of BPh with Nocturia. He has been on flomax 0.8mg  qhs for 20 years. He notes nocturia is 5-6x. IPSS 10 QOL 2. He was started on finasteride by Dr. Erlene Quan  2 months ago which has not improved his LUTS. 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 If you have any questions, ask your nurse or doctor.          acetaminophen 500 MG tablet Commonly known as: TYLENOL Take 500 mg by mouth every 6 (six) hours as needed for mild pain.   B-complex with vitamin C tablet Take 1 tablet by mouth daily.   cholecalciferol 1000 units tablet Commonly known as: VITAMIN D Take 1,000 Units by mouth daily.   cyanocobalamin 100 MCG tablet Take by mouth daily. Pt reports unsure of dosage, takes once daily   diclofenac Sodium 1 % Gel Commonly known as: VOLTAREN SMARTSIG:Gram(s) Topical   finasteride 5 MG tablet Commonly known as: PROSCAR Take 1 tablet (5 mg total) by mouth daily.   meclizine 25 MG tablet Commonly known as: ANTIVERT Take 1 tablet (25 mg total) by mouth 3 (three) times daily as needed for dizziness.   methylPREDNISolone 4 MG Tbpk tablet Commonly known as: MEDROL DOSEPAK Take by mouth.   multivitamin with minerals Tabs  tablet Take 1 tablet by mouth daily.   rosuvastatin 20 MG tablet Commonly known as: CRESTOR Take 20 mg by mouth at bedtime.   sildenafil 20 MG tablet Commonly known as: REVATIO TAKE 3-5 TABLETS BY MOUTH AS NEEDED.   tamsulosin 0.4 MG Caps capsule Commonly known as: FLOMAX Take 0.8 mg by mouth daily after supper.        Allergies: No Known Allergies  Family History: No family history on file.  Social History:  reports that he quit smoking about 59 years ago. His smoking use included cigarettes. He has never used smokeless tobacco. He reports current alcohol use. He reports that he does not use drugs.  ROS: All other review of systems were reviewed and are negative except what is noted above in HPI  Physical Exam: Wt 137 lb (62.1 kg)   BMI 18.58 kg/m   Constitutional:  Alert and oriented, No acute distress. HEENT: [place] AT, moist mucus membranes.  Trachea midline, no masses. Cardiovascular: No clubbing, cyanosis, or edema. Respiratory: Normal respiratory effort, no increased work of breathing. GI: Abdomen is soft, nontender, nondistended, no abdominal masses GU: No CVA tenderness.   Lymph: No cervical or inguinal lymphadenopathy. Skin: No rashes, bruises or suspicious lesions. Neurologic: Grossly intact, no focal deficits, moving all 4 extremities. Psychiatric: Normal mood and affect.  Laboratory Data: Lab Results  Component Value Date   WBC 6.1 05/07/2021  HGB 13.5 05/07/2021   HCT 41.7 05/07/2021   MCV 93.1 05/07/2021   PLT 143 (L) 05/07/2021    Lab Results  Component Value Date   CREATININE 1.51 (H) 05/07/2021    No results found for: PSA  No results found for: TESTOSTERONE  No results found for: HGBA1C  Urinalysis    Component Value Date/Time   COLORURINE AMBER (A) 12/24/2016 1640   APPEARANCEUR HAZY (A) 12/24/2016 1640   LABSPEC 1.017 12/24/2016 1640   PHURINE 5.0 12/24/2016 1640   GLUCOSEU NEGATIVE 12/24/2016 1640   HGBUR NEGATIVE 12/24/2016  1640   BILIRUBINUR NEGATIVE 12/24/2016 1640   KETONESUR NEGATIVE 12/24/2016 1640   PROTEINUR NEGATIVE 12/24/2016 1640   NITRITE NEGATIVE 12/24/2016 1640   LEUKOCYTESUR NEGATIVE 12/24/2016 1640    No results found for: LABMICR, WBCUA, RBCUA, LABEPIT, MUCUS, BACTERIA  Pertinent Imaging:  No results found for this or any previous visit.  No results found for this or any previous visit.  No results found for this or any previous visit.  No results found for this or any previous visit.  No results found for this or any previous visit.  No results found for this or any previous visit.  No results found for this or any previous visit.  No results found for this or any previous visit.   Assessment & Plan:    1. Urinary frequency -We will start uroxatral 10mg  qhs - Urinalysis, Routine w reflex microscopic  2. Benign prostatic hyperplasia, unspecified whether lower urinary tract symptoms present -Uroxatral 10mg  QHS. RTC 4 weeks with PVR and symptoms assessment - BLADDER SCAN AMB NON-IMAGING   No follow-ups on file.  Nicolette Bang, MD  [facility] [place]

## 2021-09-10 DIAGNOSIS — M5440 Lumbago with sciatica, unspecified side: Secondary | ICD-10-CM | POA: Diagnosis not present

## 2021-09-10 DIAGNOSIS — N400 Enlarged prostate without lower urinary tract symptoms: Secondary | ICD-10-CM | POA: Diagnosis present

## 2021-09-10 DIAGNOSIS — R5383 Other fatigue: Secondary | ICD-10-CM | POA: Diagnosis not present

## 2021-09-10 DIAGNOSIS — N179 Acute kidney failure, unspecified: Secondary | ICD-10-CM | POA: Diagnosis not present

## 2021-09-10 DIAGNOSIS — C8590 Non-Hodgkin lymphoma, unspecified, unspecified site: Secondary | ICD-10-CM | POA: Diagnosis not present

## 2021-09-11 ENCOUNTER — Observation Stay (HOSPITAL_COMMUNITY)
Admission: EM | Admit: 2021-09-11 | Discharge: 2021-09-12 | Disposition: A | Discharge status: Home / Self Care | Payer: Medicare Other | Attending: Internal Medicine | Admitting: Internal Medicine

## 2021-09-11 ENCOUNTER — Other Ambulatory Visit: Payer: Self-pay

## 2021-09-11 ENCOUNTER — Encounter (HOSPITAL_COMMUNITY): Payer: Self-pay

## 2021-09-11 ENCOUNTER — Observation Stay (HOSPITAL_COMMUNITY): Payer: Medicare Other

## 2021-09-11 DIAGNOSIS — N184 Chronic kidney disease, stage 4 (severe): Secondary | ICD-10-CM | POA: Diagnosis not present

## 2021-09-11 DIAGNOSIS — Z79899 Other long term (current) drug therapy: Secondary | ICD-10-CM | POA: Insufficient documentation

## 2021-09-11 DIAGNOSIS — N179 Acute kidney failure, unspecified: Principal | ICD-10-CM | POA: Insufficient documentation

## 2021-09-11 DIAGNOSIS — D649 Anemia, unspecified: Secondary | ICD-10-CM | POA: Diagnosis not present

## 2021-09-11 DIAGNOSIS — C8300 Small cell B-cell lymphoma, unspecified site: Secondary | ICD-10-CM | POA: Diagnosis not present

## 2021-09-11 DIAGNOSIS — E785 Hyperlipidemia, unspecified: Secondary | ICD-10-CM | POA: Diagnosis not present

## 2021-09-11 DIAGNOSIS — R799 Abnormal finding of blood chemistry, unspecified: Secondary | ICD-10-CM | POA: Diagnosis present

## 2021-09-11 DIAGNOSIS — Z87891 Personal history of nicotine dependence: Secondary | ICD-10-CM | POA: Insufficient documentation

## 2021-09-11 DIAGNOSIS — N400 Enlarged prostate without lower urinary tract symptoms: Secondary | ICD-10-CM | POA: Diagnosis not present

## 2021-09-11 DIAGNOSIS — G7000 Myasthenia gravis without (acute) exacerbation: Secondary | ICD-10-CM | POA: Diagnosis present

## 2021-09-11 DIAGNOSIS — Z20822 Contact with and (suspected) exposure to covid-19: Secondary | ICD-10-CM | POA: Insufficient documentation

## 2021-09-11 DIAGNOSIS — D61818 Other pancytopenia: Secondary | ICD-10-CM | POA: Diagnosis present

## 2021-09-11 DIAGNOSIS — N19 Unspecified kidney failure: Secondary | ICD-10-CM

## 2021-09-11 LAB — URINALYSIS, ROUTINE W REFLEX MICROSCOPIC
Bilirubin Urine: NEGATIVE
Glucose, UA: 50 mg/dL — AB
Ketones, ur: NEGATIVE mg/dL
Leukocytes,Ua: NEGATIVE
Nitrite: NEGATIVE
Protein, ur: 30 mg/dL — AB
Specific Gravity, Urine: 1.012 (ref 1.005–1.030)
pH: 5 (ref 5.0–8.0)

## 2021-09-11 LAB — SODIUM, URINE, RANDOM: Sodium, Ur: 54 mmol/L

## 2021-09-11 LAB — CBC WITH DIFFERENTIAL/PLATELET
Abs Immature Granulocytes: 0.02 10*3/uL (ref 0.00–0.07)
Basophils Absolute: 0 10*3/uL (ref 0.0–0.1)
Basophils Relative: 0 %
Eosinophils Absolute: 0.1 10*3/uL (ref 0.0–0.5)
Eosinophils Relative: 3 %
HCT: 35.9 % — ABNORMAL LOW (ref 39.0–52.0)
Hemoglobin: 12.2 g/dL — ABNORMAL LOW (ref 13.0–17.0)
Immature Granulocytes: 0 %
Lymphocytes Relative: 15 %
Lymphs Abs: 0.8 10*3/uL (ref 0.7–4.0)
MCH: 31.1 pg (ref 26.0–34.0)
MCHC: 34 g/dL (ref 30.0–36.0)
MCV: 91.6 fL (ref 80.0–100.0)
Monocytes Absolute: 0.5 10*3/uL (ref 0.1–1.0)
Monocytes Relative: 9 %
Neutro Abs: 3.7 10*3/uL (ref 1.7–7.7)
Neutrophils Relative %: 73 %
Platelets: 142 10*3/uL — ABNORMAL LOW (ref 150–400)
RBC: 3.92 MIL/uL — ABNORMAL LOW (ref 4.22–5.81)
RDW: 14.6 % (ref 11.5–15.5)
WBC: 5.2 10*3/uL (ref 4.0–10.5)
nRBC: 0 % (ref 0.0–0.2)

## 2021-09-11 LAB — BASIC METABOLIC PANEL
Anion gap: 12 (ref 5–15)
BUN: 73 mg/dL — ABNORMAL HIGH (ref 8–23)
CO2: 18 mmol/L — ABNORMAL LOW (ref 22–32)
Calcium: 9.1 mg/dL (ref 8.9–10.3)
Chloride: 104 mmol/L (ref 98–111)
Creatinine, Ser: 3.61 mg/dL — ABNORMAL HIGH (ref 0.61–1.24)
GFR, Estimated: 15 mL/min — ABNORMAL LOW (ref >60)
Glucose, Bld: 89 mg/dL (ref 70–99)
Potassium: 4.8 mmol/L (ref 3.5–5.1)
Sodium: 134 mmol/L — ABNORMAL LOW (ref 135–145)

## 2021-09-11 LAB — RESP PANEL BY RT-PCR (FLU A&B, COVID) ARPGX2
Influenza A by PCR: NEGATIVE
Influenza B by PCR: NEGATIVE
SARS Coronavirus 2 by RT PCR: NEGATIVE

## 2021-09-11 LAB — CREATININE, URINE, RANDOM: Creatinine, Urine: 76.54 mg/dL

## 2021-09-11 MED ORDER — FINASTERIDE 5 MG PO TABS
5.0000 mg | ORAL_TABLET | Freq: Every day | ORAL | Status: DC
  Administered 2021-09-12: 5 mg via ORAL
  Filled 2021-09-11: qty 1

## 2021-09-11 MED ORDER — LACTATED RINGERS IV BOLUS
1000.0000 mL | Freq: Once | INTRAVENOUS | Status: AC
  Administered 2021-09-11: 1000 mL via INTRAVENOUS

## 2021-09-11 MED ORDER — PYRIDOSTIGMINE BROMIDE 60 MG PO TABS
30.0000 mg | ORAL_TABLET | Freq: Three times a day (TID) | ORAL | Status: DC
  Administered 2021-09-11 – 2021-09-12 (×2): 30 mg via ORAL
  Filled 2021-09-11 (×2): qty 1

## 2021-09-11 MED ORDER — LACTATED RINGERS IV BOLUS
500.0000 mL | Freq: Once | INTRAVENOUS | Status: AC
  Administered 2021-09-11: 500 mL via INTRAVENOUS

## 2021-09-11 MED ORDER — ADULT MULTIVITAMIN W/MINERALS CH
1.0000 | ORAL_TABLET | Freq: Every day | ORAL | Status: DC
  Administered 2021-09-12: 1 via ORAL
  Filled 2021-09-11: qty 1

## 2021-09-11 MED ORDER — TRAMADOL HCL 50 MG PO TABS
50.0000 mg | ORAL_TABLET | Freq: Three times a day (TID) | ORAL | Status: DC | PRN
  Administered 2021-09-11: 50 mg via ORAL
  Filled 2021-09-11: qty 1

## 2021-09-11 MED ORDER — SODIUM CHLORIDE 0.9 % IV SOLN: INTRAVENOUS | Status: AC

## 2021-09-11 MED ORDER — ACETAMINOPHEN 325 MG PO TABS: 650.0000 mg | ORAL_TABLET | Freq: Four times a day (QID) | ORAL | Status: DC | PRN

## 2021-09-11 MED ORDER — ROSUVASTATIN CALCIUM 20 MG PO TABS
20.0000 mg | ORAL_TABLET | Freq: Every day | ORAL | Status: DC
  Administered 2021-09-11: 20 mg via ORAL
  Filled 2021-09-11: qty 1

## 2021-09-11 MED ORDER — ACETAMINOPHEN 650 MG RE SUPP: 650.0000 mg | Freq: Four times a day (QID) | RECTAL | Status: DC | PRN

## 2021-09-11 MED ORDER — ALFUZOSIN HCL ER 10 MG PO TB24
10.0000 mg | ORAL_TABLET | Freq: Every day | ORAL | Status: DC
  Administered 2021-09-11: 10 mg via ORAL
  Filled 2021-09-11 (×3): qty 1

## 2021-09-11 MED ORDER — ONDANSETRON HCL 4 MG PO TABS: 4.0000 mg | ORAL_TABLET | Freq: Four times a day (QID) | ORAL | Status: DC | PRN

## 2021-09-11 MED ORDER — ONDANSETRON HCL 4 MG/2ML IJ SOLN: 4.0000 mg | Freq: Four times a day (QID) | INTRAMUSCULAR | Status: DC | PRN

## 2021-09-11 MED ORDER — ENSURE ENLIVE PO LIQD
237.0000 mL | Freq: Two times a day (BID) | ORAL | Status: DC
  Administered 2021-09-12: 237 mL via ORAL

## 2021-09-11 NOTE — ED Triage Notes (Signed)
Pt arrived POV. Pt stated that he was sent here from his PCP in regards to abnormal kidney function. Pt states that his BUN & Creatinine were elevated and he was sent to be "flushed out".

## 2021-09-11 NOTE — ED Provider Notes (Signed)
[facility] EMERGENCY DEPARTMENT Provider Note   CSN: [id number] Arrival date & time: 09/11/21  1022     History No chief complaint on file.   Paul Keith is a 87 y.o. male.  HPI    Pt comes in w/ cc of abnormal lab. Pt has hx of CLL, diverticulosis and BPH.  Pt went to pcp yday and was advised to come to the ER for worsening renal failure. No new meds. Denies n/v -but does indicate that he had profound diarrhea for 2 nights about 2 nights ago.  Since then he has been taking loperamide and had maybe 3 episodes of loose BM.  No recent antibiotics. Taking 600 mg ibuprofen for the last 8 days.  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  Social History   Tobacco Use   Smoking status: Former    Types: Cigarettes    Quit date: 09/15/1962    Years since quitting: 59.0   Smokeless tobacco: Never  Substance Use Topics   Alcohol use: Not Currently    Comment: OCCASSIONAL   Drug use: No    Home Medications Prior to Admission medications   Medication Sig Start Date End Date Taking? Authorizing Provider  acetaminophen (TYLENOL) 500 MG tablet Take 500 mg by mouth every 6  (six) hours as needed for mild pain.    [provider]  alfuzosin (UROXATRAL) 10 MG 24 hr tablet Take 1 tablet (10 mg total) by mouth at bedtime. 09/07/21   McKenzie, Candee Furbish, MD  B Complex-C (B-COMPLEX WITH VITAMIN C) tablet Take 1 tablet by mouth daily.    [provider]  cholecalciferol (VITAMIN D) 1000 units tablet Take 1,000 Units by mouth daily.    [provider]  cyanocobalamin 100 MCG tablet Take by mouth daily. Pt reports unsure of dosage, takes once daily    [provider]  diclofenac Sodium (VOLTAREN) 1 % GEL SMARTSIG:Gram(s) Topical 06/20/21   [provider]  finasteride (PROSCAR) 5 MG tablet Take 1 tablet (5 mg total) by mouth daily. 06/24/21   Hollice Espy, MD  meclizine (ANTIVERT) 25 MG tablet Take 1 tablet (25 mg total) by mouth 3 (three) times daily as needed for dizziness. 05/02/19   Derek Jack, MD  methylPREDNISolone (MEDROL DOSEPAK) 4 MG TBPK tablet Take by mouth. 06/17/21   [provider]  Multiple Vitamin (MULTIVITAMIN WITH MINERALS) TABS tablet Take 1 tablet by mouth daily.    [provider]  rosuvastatin (CRESTOR) 20 MG tablet Take 20 mg by mouth at bedtime. 06/11/21   [provider]  sildenafil (REVATIO) 20 MG tablet TAKE 3-5 TABLETS BY MOUTH AS NEEDED. 03/13/19   [provider]  tamsulosin (FLOMAX) 0.4  MG CAPS capsule Take 0.8 mg by mouth daily after supper.     [provider]    Allergies    Patient has no known allergies.  Review of Systems   Review of Systems  Constitutional:  Positive for activity change.  Gastrointestinal:  Negative for nausea and vomiting.  Genitourinary:  Negative for dysuria.  Hematological:  Does not bruise/bleed easily.  All other systems reviewed and are negative.  Physical Exam Updated Vital Signs BP 136/80   Pulse 62   Temp 97.7 F (36.5 C) (Oral)   Resp 18   Ht 6' (1.829 m)   Wt 61.8 kg   SpO2 100%   BMI 18.47 kg/m    Physical Exam Vitals and nursing note reviewed.  Constitutional:      Appearance: He is well-developed.  HENT:     Head: Atraumatic.  Cardiovascular:     Rate and Rhythm: Normal rate.  Pulmonary:     Effort: Pulmonary effort is normal.  Abdominal:     Tenderness: There is no abdominal tenderness.  Musculoskeletal:     Cervical back: Neck supple.  Skin:    General: Skin is warm.  Neurological:     Mental Status: He is alert and oriented to person, place, and time.    ED Results / Procedures / Treatments   Labs (all labs ordered are listed, but only abnormal results are displayed) Labs Reviewed  CBC WITH DIFFERENTIAL/PLATELET - Abnormal; Notable for the following components:      Result Value   RBC 3.92 (*)    Hemoglobin 12.2 (*)    HCT 35.9 (*)    Platelets 142 (*)    All other components within normal limits  BASIC METABOLIC PANEL - Abnormal; Notable for the following components:   Sodium 134 (*)    CO2 18 (*)    BUN 73 (*)    Creatinine, Ser 3.61 (*)    GFR, Estimated 15 (*)    All other components within normal limits  SODIUM, URINE, RANDOM  CREATININE, URINE, RANDOM    EKG None  Radiology No results found.  Procedures Procedures   Medications Ordered in ED Medications  lactated ringers bolus 500 mL (0 mLs Intravenous Stopped 09/11/21 1251)  lactated ringers bolus 1,000 mL (1,000 mLs Intravenous New Bag/Given 09/11/21 1347)    ED Course  I have reviewed the triage vital signs and the nursing notes.  Pertinent labs & imaging results that were available during my care of the patient were reviewed by me and considered in my medical decision making (see chart for details).    MDM Rules/Calculators/A&P                           87 y/o M with hx of CLL comes in with cc of worsening Cr. Increased NSAID use recently.  Had recent bout of diarrhea, but it seems like it is getting better.  Hemodynamically stable. We will recheck labs and give some  ivf.  2:02 PM Patient has profound uremia with AKI.  Creatinine is more than doubled.  Suspected to be most likely prerenal failure.  He really in total received 1.5 L in the ED.  Stable for admission thereafter.  Final Clinical Impression(s) / ED Diagnoses Final diagnoses:  AKI (acute kidney injury) ([place])  Uremia    Rx / DC Orders ED Discharge Orders     None        Ellene Bloodsaw,  MD 09/11/21 1402

## 2021-09-11 NOTE — Plan of Care (Signed)

## 2021-09-11 NOTE — H&P (Signed)
History and Physical    Madden Garron OXB:[id number] DOB: 06-16-1930 DOA: 09/11/2021  PCP: Wilburt Finlay, MD   Patient coming from: Home  I have personally briefly reviewed patient's old medical records in [place]  Chief Complaint: Abnormal blood work (increased creatinine level).  HPI: Wilfrid Hyser is a 87 y.o. male with medical history significant of lymphoplasmacytoid lymphoma, pancytopenia, chronic kidney disease a stage IV, hyperlipidemia and BPH; who presented to the emergency department secondary to abnormal blood work at his PCP office.  Patient reports that approximately a week or so ago he went to the emergency department with complaints of ongoing pain and concerns of having shingles exacerbation at that time he was instructed to use as needed pain medication using ibuprofen; daughter at bedside reported that the patient has been taking a schedule IV times a day 600 mg of ibuprofen.  Abnormal blood work at his PCP office demonstrated acute renal failure with significant elevation in his creatinine level.  Patient was sent to the hospital for further evaluation and management.  Patient expressed increase fatigue and also decreased appetite.  He reports no fever, no cough, no chest pain, no nausea, no vomiting, no focal deficits, no dysuria, no hematuria, no headaches or any other complaints.  ED Course: Blood work demonstrating BUN 73, creatinine of 3.61 (at baseline in the 1.6-1.8 range); patient with a stage IV chronic kidney disease which has been worsening most likely in the setting of prerenal azotemia and nephrotoxic agents usage.  IV fluids has been initiated and [facility] has been contacted to place in the hospital for further evaluation and management.  Review of Systems: As per HPI otherwise all other systems reviewed and are negative.  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 His smoking use included cigarettes. He has never used smokeless tobacco. He reports that he does not currently use alcohol. He reports that he does not use drugs.  No Known Allergies  Family history: Patient reports history of hypertension running in his family; otherwise noncontributory.  Prior to Admission medications   Medication Sig Start Date End Date Taking? Authorizing Provider  acetaminophen (TYLENOL) 500 MG tablet Take 1,000 mg by mouth every 6 (six) hours as needed for mild pain.   Yes [provider]  alfuzosin (UROXATRAL) 10 MG 24 hr tablet Take 1 tablet (10 mg total) by mouth at bedtime. 09/07/21  Yes McKenzie, Candee Furbish, MD  cholecalciferol (VITAMIN D) 1000 units tablet Take 1,000 Units by mouth daily.   Yes [provider]  diclofenac Sodium (VOLTAREN) 1 % GEL Apply 2 g topically 4 (four) times daily. 06/20/21  Yes [provider]  finasteride (PROSCAR) 5 MG tablet Take 1 tablet (5 mg total) by mouth daily. 06/24/21  Yes Hollice Espy, MD  meclizine (ANTIVERT) 25 MG tablet Take 1 tablet (25 mg total) by mouth 3 (three) times daily as needed for dizziness. 05/02/19  Yes Derek Jack, MD  Multiple Vitamin (MULTIVITAMIN WITH MINERALS) TABS tablet Take 1 tablet by mouth daily.   Yes [provider]  pyridostigmine (MESTINON) 60 MG tablet Take 30 mg by mouth 3 (three) times daily. 09/03/21 09/23/21 Yes [provider]  rosuvastatin (CRESTOR) 20 MG tablet Take 20 mg by mouth at bedtime. 06/11/21  Yes [provider]  traMADol (ULTRAM) 50 MG tablet Take 50 mg by mouth 3 (three) times daily.  08/26/21  Yes [provider]  valACYclovir (VALTREX) 1000 MG tablet Take 1,000 mg by mouth 3 (three) times daily. 08/18/21  Yes [provider]  B Complex-C (B-COMPLEX WITH VITAMIN C) tablet Take 1 tablet by mouth daily. Patient not taking: Reported on 09/11/2021    [provider]  cyanocobalamin 100 MCG tablet Take by mouth daily. Pt reports unsure of dosage, takes once daily Patient not taking: No sig reported    [provider]  methylPREDNISolone (MEDROL DOSEPAK) 4 MG TBPK tablet Take by mouth. Patient not taking: No sig reported 06/17/21   [provider]  sildenafil (REVATIO) 20 MG tablet TAKE 3-5 TABLETS BY MOUTH AS NEEDED. 03/13/19   [provider]    Physical Exam: Vitals:   09/11/21 1100 09/11/21 1300 09/11/21 1400 09/11/21 1500  BP: 125/66 136/80 129/66 138/67  Pulse: 64 62 79 62  Resp: 16 18 18 16   Temp:      TempSrc:      SpO2: 100% 100% 98% 100%  Weight:      Height:        Constitutional: NAD, calm, afebrile and reporting no chest pain, shortness of breath or vomiting. Vitals:   09/11/21 1100 09/11/21 1300 09/11/21 1400 09/11/21 1500  BP: 125/66 136/80 129/66 138/67  Pulse: 64 62 79 62  Resp: 16 18 18 16   Temp:      TempSrc:      SpO2: 100% 100% 98% 100%  Weight:      Height:       Eyes: PERRL, lids and conjunctivae normal; no icterus, no nystagmus. ENMT: Mucous membranes are moist. Posterior pharynx clear of any exudate or lesions. Neck: normal, supple, no masses, no thyromegaly; no JVD. Respiratory: clear to auscultation bilaterally, no wheezing, no crackles. Normal respiratory effort. No accessory muscle use.  Good saturation on room air. Cardiovascular: Regular rate and rhythm, no murmurs / rubs / gallops.  No carotid bruits. Abdomen: no tenderness, no masses palpated. No hepatosplenomegaly. Bowel sounds positive.  Musculoskeletal: no clubbing / cyanosis. No joint deformity upper and lower extremities.  Good ROM, no contractures. Normal muscle tone.  No edema appreciated. Skin: no r petechiae; no open wounds. Neurologic: CN 2-12 grossly intact.  No focal deficits; muscle strength 3-4 out of 5 in the setting of poor effort. Psychiatric: Normal judgment and insight. Alert and oriented x 3. Normal mood.   Labs on Admission: I have personally reviewed following labs and imaging studies  CBC: Recent Labs  Lab 09/11/21 1129  WBC 5.2  NEUTROABS 3.7  HGB 12.2*  HCT 35.9*  MCV 91.6  PLT 142*    Basic Metabolic Panel: Recent Labs  Lab 09/11/21 1129  NA 134*  K 4.8  CL 104  CO2 18*  GLUCOSE 89  BUN 73*  CREATININE 3.61*  CALCIUM 9.1    GFR: Estimated Creatinine Clearance: 12.1 mL/min (A) (by C-G formula based on SCr of 3.61 mg/dL (H)).   Urine analysis:    Component Value Date/Time   COLORURINE STRAW (A) 09/11/2021 1428   APPEARANCEUR CLEAR 09/11/2021 1428   LABSPEC 1.012 09/11/2021 1428   PHURINE 5.0 09/11/2021 1428   GLUCOSEU  50 (A) 09/11/2021 1428   HGBUR SMALL (A) 09/11/2021 1428   BILIRUBINUR NEGATIVE 09/11/2021 1428   KETONESUR NEGATIVE 09/11/2021 1428   PROTEINUR 30 (A) 09/11/2021 1428   NITRITE NEGATIVE 09/11/2021 [place] 09/11/2021 1428    Radiological Exams on Admission: US RENAL  Result Date: 09/11/2021 CLINICAL DATA:  Acute renal failure EXAM: RENAL / URINARY TRACT ULTRASOUND COMPLETE COMPARISON:  CT abdomen/pelvis 08/25/2016 FINDINGS: Right Kidney: Renal measurements: 9.3 cm x 4.3 cm x 4.7 cm = volume: 97 mL. Echogenicity within normal limits. No mass or hydronephrosis visualized. Left Kidney: Renal measurements: 10.8 cm x 5.1 cm x 4.5 cm = volume: 129 mL. Echogenicity within normal limits. No mass or hydronephrosis visualized. Bladder: The bladder wall appears abnormally thickened and trabeculated. The postvoid residual measured 275 cc. The prostate appears mildly enlarged. Other: None. IMPRESSION: 1. Thickened and trabeculated bladder  wall with a significant postvoid residual of 275 cc suggestive of outlet obstruction, possibly by an enlarged prostate. 2. Normal appearance of the kidneys with no hydronephrosis. Electronically Signed   By: Valetta Mole M.D.   On: 09/11/2021 15:12    EKG: None Assessment/Plan 1-Acute renal failure superimposed on stage 4 chronic kidney disease, unspecified acute renal failure type ([place]) -In the setting of prerenal azotemia and the use of nephrotoxic agents (NSAIDs and valacyclovir). -Hold nephrotoxic agents -Check UA and renal ultrasound -Provide fluid resuscitation -Avoid the use of contrast and hypotension. -Follow renal function trend. -Electrolytes are stable currently.  2-Pancytopenia ([place]) -No signs of overt bleeding -Hemoglobin stable -Platelet count low but at baseline for him -Will avoid the use of heparin products -SCDs for DVT prophylaxis.  3-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[place]) -Continue patient follow-up with oncology service. -Overall condition appears to be stable.  4-HLD (hyperlipidemia) -Continue treatment with statins.  5-Myasthenia gravis ([place]) -Continue the use of Mestinon -Continue supportive care maintain adequate hydration -Continue outpatient follow-up with neurology service.  6-BPH (benign prostatic hyperplasia) -Continue the use of finasteride and alfuzosin -Patient reports no abdominal pain/distention in the suprapubic area and express that he is passing urine without much of a problems. -Patient reported nocturia  DVT prophylaxis: SCDs Code Status:   Full code Family Communication:  Daughter at bedside. Disposition Plan:   Patient is from:  Home  Anticipated DC to:  Home  Anticipated DC date:  09/12/2021  Anticipated DC barriers: Further stabilization/improvement of renal function.  Consults called: None Admission status:  Observation, MedSurg, length of stay less than 2 midnights.  Severity of Illness: The appropriate patient status  for this patient is OBSERVATION. Observation status is judged to be reasonable and necessary in order to provide the required intensity of service to ensure the patient's safety. The patient's presenting symptoms, physical exam findings, and initial radiographic and laboratory data in the context of their medical condition is felt to place them at decreased risk for further clinical deterioration. Furthermore, it is anticipated that the patient will be medically stable for discharge from the hospital within 2 midnights of admission.     Barton Dubois MD [facility]  How to contact the [facility] Attending or Consulting provider [place] or covering provider during after hours [place], for this patient?   Check the care team in [facility] and look for a) attending/consulting [facility] provider listed and b) the [facility] team listed Log into [email or web address] and use [place]'s universal password to access. If you do not have the password, please contact the hospital operator. Locate the [facility]  provider you are looking for under [facility] and page to a number that you can be directly reached. If you still have difficulty reaching the provider, please page the [facility] (Director on Call) for the Hospitalists listed on amion for assistance.  09/11/2021, 4:58 PM

## 2021-09-11 NOTE — ED Notes (Signed)
Attempted to call report x 1  

## 2021-09-12 DIAGNOSIS — G7000 Myasthenia gravis without (acute) exacerbation: Secondary | ICD-10-CM

## 2021-09-12 DIAGNOSIS — E785 Hyperlipidemia, unspecified: Secondary | ICD-10-CM | POA: Diagnosis not present

## 2021-09-12 DIAGNOSIS — C8300 Small cell B-cell lymphoma, unspecified site: Secondary | ICD-10-CM | POA: Diagnosis not present

## 2021-09-12 DIAGNOSIS — N400 Enlarged prostate without lower urinary tract symptoms: Secondary | ICD-10-CM | POA: Diagnosis not present

## 2021-09-12 DIAGNOSIS — N19 Unspecified kidney failure: Secondary | ICD-10-CM

## 2021-09-12 DIAGNOSIS — N179 Acute kidney failure, unspecified: Secondary | ICD-10-CM | POA: Diagnosis not present

## 2021-09-12 LAB — BASIC METABOLIC PANEL
Anion gap: 10 (ref 5–15)
BUN: 60 mg/dL — ABNORMAL HIGH (ref 8–23)
CO2: 19 mmol/L — ABNORMAL LOW (ref 22–32)
Calcium: 8.9 mg/dL (ref 8.9–10.3)
Chloride: 109 mmol/L (ref 98–111)
Creatinine, Ser: 2.56 mg/dL — ABNORMAL HIGH (ref 0.61–1.24)
GFR, Estimated: 23 mL/min — ABNORMAL LOW (ref >60)
Glucose, Bld: 91 mg/dL (ref 70–99)
Potassium: 4.4 mmol/L (ref 3.5–5.1)
Sodium: 138 mmol/L (ref 135–145)

## 2021-09-12 NOTE — Discharge Summary (Signed)
Physician Discharge Summary  Paul Keith OEV:[id number] DOB: 07/08/1930 DOA: 09/11/2021  PCP: Wilburt Finlay, MD  Admit date: 09/11/2021 Discharge date: 09/12/2021  Time spent: 30 minutes  Recommendations for Outpatient Follow-up:  Repeat basic metabolic panel to evaluate lites and renal function Goals of care discussion and advance care planning recommended.   Discharge Diagnoses:  Principal Problem:   Acute renal failure superimposed on stage 4 chronic kidney disease, unspecified acute renal failure type (HCC) Active Problems:   Pancytopenia (HCC)   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HCC)   HLD (hyperlipidemia)   Myasthenia gravis (HCC)   BPH (benign prostatic hyperplasia)   Uremia   Discharge Condition: Stable and improved.  Discharged home with instruction to follow-up with PCP in 1 week.  CODE STATUS: Full code.  Diet recommendation: Heart healthy diet.  Filed Weights   09/11/21 1031 09/12/21 0500  Weight: 61.8 kg 61.8 kg    History of present illness:  Paul Keith is a 87 y.o. male with medical history significant of lymphoplasmacytoid lymphoma, pancytopenia, chronic kidney disease a stage IV, hyperlipidemia and BPH; who presented to the emergency department secondary to abnormal blood work at his PCP office.  Patient reports that approximately a week or so ago he went to the emergency department with complaints of ongoing pain and concerns of having shingles exacerbation at that time he was instructed to use as needed pain medication using ibuprofen; daughter at bedside reported that the patient has been taking a schedule IV times a day 600 mg of ibuprofen.  Abnormal blood work at his PCP office demonstrated acute renal failure with significant elevation in his creatinine level.  Patient was sent to the hospital for further evaluation and management.  Patient expressed increase fatigue and also decreased appetite.  He reports no fever, no cough, no chest pain,  no nausea, no vomiting, no focal deficits, no dysuria, no hematuria, no headaches or any other complaints.   ED Course: Blood work demonstrating BUN 73, creatinine of 3.61 (at baseline in the 1.6-1.8 range); patient with a stage IV chronic kidney disease which has been worsening most likely in the setting of prerenal azotemia and nephrotoxic agents usage.  IV fluids has been initiated and [facility] has been contacted to place in the hospital for further evaluation and management.  Hospital Course:  1-acute renal failure superimposed on stage IV chronic kidney disease -Appears to be in the setting of prerenal azotemia and the use of nephrotoxic agents (specifically NSAIDs) -After fluid resuscitation and holding nephrotoxic agents renal function has trended down extraordinarily well and creatinine at discharge was down to 2.5 -Patient instructed to continue avoiding nephrotoxic agents and to keep himself well-hydrated -Outpatient follow-up with PCP in 1 week to assure complete resolution of acute kidney injury. -Urinalysis demonstrated no signs of acute infection -Renal ultrasound suggesting a component of bladder outlet obstruction (for which patient is already following urology 4); there was no hydronephrosis or hydroureter.  2-pancytopenia -Patient with underlying history of limb for blood somatic lymphoma -Hemoglobin stable -No signs of overt bleeding -Continue to follow blood cell count with a repeat CBC at follow-up visit.  3-malignant lymphoplasmacytic lymphoma -Continue patient follow-up with oncology service.  4-hyperlipidemia -Continue statins. -Heart healthy diet encouraged.  5-myasthenia gravis -Continue the use of Mestinon -Continue outpatient follow-up with neurology service -Patient advised to maintain adequate hydration.  6-history of BPH -Continue outpatient follow-up with urology service -Continue the use of finasteride and alfuzosin.  Procedures: See below for x-ray  report.  Consultations:  None  Discharge Exam: Vitals:   09/11/21 2043 09/12/21 0430  BP: 125/74 128/70  Pulse: 81 85  Resp: 19 19  Temp: 97.8 F (36.6 C) 98.1 F (36.7 C)  SpO2: 100% 100%    General: Afebrile, no chest pain, no nausea, no vomiting.  Reports feeling good and noticing increase in his urine output.  Feeling ready to go home. Cardiovascular: S1 and S2, no rubs, no gallops, no JVD. Respiratory: Clear to auscultation bilaterally; no requiring oxygen supplementation.  No using accessory muscle. Abdomen: Soft, nontender, nondistended, positive bowel sounds Extremities: No cyanosis or clubbing.  Discharge Instructions   Discharge Instructions     Diet - low sodium heart healthy   Complete by: As directed    Discharge instructions   Complete by: As directed    Maintain adequate hydration Take medications as prescribed Avoid the use of NSAIDs (ibuprofen, Motrin, Advil, Aleve, Excedrin, aspirin, Goody powders or any other over-the-counter medication outside from Tylenol) Arrange follow-up with PCP in 1 week Follow heart healthy/low-sodium diet.      Allergies as of 09/12/2021   No Known Allergies      Medication List     STOP taking these medications    B-complex with vitamin C tablet   cyanocobalamin 100 MCG tablet   diclofenac Sodium 1 % Gel Commonly known as: VOLTAREN   methylPREDNISolone 4 MG Tbpk tablet Commonly known as: MEDROL DOSEPAK   valACYclovir 1000 MG tablet Commonly known as: VALTREX       TAKE these medications    acetaminophen 500 MG tablet Commonly known as: TYLENOL Take 1,000 mg by mouth every 6 (six) hours as needed for mild pain.   alfuzosin 10 MG 24 hr tablet Commonly known as: UROXATRAL Take 1 tablet (10 mg total) by mouth at bedtime.   cholecalciferol 1000 units tablet Commonly known as: VITAMIN D Take 1,000 Units by mouth daily.   finasteride 5 MG tablet Commonly known as: PROSCAR Take 1 tablet (5 mg total)  by mouth daily.   meclizine 25 MG tablet Commonly known as: ANTIVERT Take 1 tablet (25 mg total) by mouth 3 (three) times daily as needed for dizziness.   multivitamin with minerals Tabs tablet Take 1 tablet by mouth daily.   pyridostigmine 60 MG tablet Commonly known as: MESTINON Take 30 mg by mouth 3 (three) times daily.   rosuvastatin 20 MG tablet Commonly known as: CRESTOR Take 20 mg by mouth at bedtime.   sildenafil 20 MG tablet Commonly known as: REVATIO TAKE 3-5 TABLETS BY MOUTH AS NEEDED.   traMADol 50 MG tablet Commonly known as: ULTRAM Take 50 mg by mouth 3 (three) times daily.       No Known Allergies  Follow-up Information     Kotturi, Tyler Deis, MD. Schedule an appointment as soon as possible for a visit in 1 week(s).   Specialty: Family Medicine Contact information: [place] [address] [phone]                 The results of significant diagnostics from this hospitalization (including imaging, microbiology, ancillary and laboratory) are listed below for reference.    Significant Diagnostic Studies: US RENAL  Result Date: 09/11/2021 CLINICAL DATA:  Acute renal failure EXAM: RENAL / URINARY TRACT ULTRASOUND COMPLETE COMPARISON:  CT abdomen/pelvis 08/25/2016 FINDINGS: Right Kidney: Renal measurements: 9.3 cm x 4.3 cm x 4.7 cm = volume: 97 mL. Echogenicity within normal limits. No mass or hydronephrosis visualized. Left Kidney: Renal measurements:  10.8 cm x 5.1 cm x 4.5 cm = volume: 129 mL. Echogenicity within normal limits. No mass or hydronephrosis visualized. Bladder: The bladder wall appears abnormally thickened and trabeculated. The postvoid residual measured 275 cc. The prostate appears mildly enlarged. Other: None. IMPRESSION: 1. Thickened and trabeculated bladder wall with a significant postvoid residual of 275 cc suggestive of outlet obstruction, possibly by an enlarged prostate. 2. Normal appearance of the kidneys with no  hydronephrosis. Electronically Signed   By: Valetta Mole M.D.   On: 09/11/2021 15:12    Microbiology: Recent Results (from the past 240 hour(s))  Resp Panel by RT-PCR (Flu A&B, Covid) Nasopharyngeal Swab     Status: None   Collection Time: 09/11/21  2:28 PM   Specimen: Nasopharyngeal Swab; Nasopharyngeal(NP) swabs in vial transport medium  Result Value Ref Range Status   SARS Coronavirus 2 by RT PCR NEGATIVE NEGATIVE Final    Comment: (NOTE) SARS-CoV-2 target nucleic acids are NOT DETECTED.  The SARS-CoV-2 RNA is generally detectable in upper respiratory specimens during the acute phase of infection. The lowest concentration of SARS-CoV-2 viral copies this assay can detect is 138 copies/mL. A negative result does not preclude SARS-Cov-2 infection and should not be used as the sole basis for treatment or other patient management decisions. A negative result may occur with  improper specimen collection/handling, submission of specimen other than nasopharyngeal swab, presence of viral mutation(s) within the areas targeted by this assay, and inadequate number of viral copies(<138 copies/mL). A negative result must be combined with clinical observations, patient history, and epidemiological information. The expected result is Negative.  Fact Sheet for Patients:  EntrepreneurPulse.com.au  Fact Sheet for Healthcare Providers:  IncredibleEmployment.be  This test is no t yet approved or cleared by the [place] FDA and  has been authorized for detection and/or diagnosis of SARS-CoV-2 by FDA under an Emergency Use Authorization (EUA). This EUA will remain  in effect (meaning this test can be used) for the duration of the COVID-19 declaration under Section 564(b)(1) of the Act, 21 U.S.C.section 360bbb-3(b)(1), unless the authorization is terminated  or revoked sooner.       Influenza A by PCR NEGATIVE NEGATIVE Final   Influenza B by PCR NEGATIVE  NEGATIVE Final    Comment: (NOTE) The Xpert Xpress SARS-CoV-2/FLU/RSV plus assay is intended as an aid in the diagnosis of influenza from Nasopharyngeal swab specimens and should not be used as a sole basis for treatment. Nasal washings and aspirates are unacceptable for Xpert Xpress SARS-CoV-2/FLU/RSV testing.  Fact Sheet for Patients: EntrepreneurPulse.com.au  Fact Sheet for Healthcare Providers: IncredibleEmployment.be  This test is not yet approved or cleared by the [place] FDA and has been authorized for detection and/or diagnosis of SARS-CoV-2 by FDA under an Emergency Use Authorization (EUA). This EUA will remain in effect (meaning this test can be used) for the duration of the COVID-19 declaration under Section 564(b)(1) of the Act, 21 U.S.C. section 360bbb-3(b)(1), unless the authorization is terminated or revoked.  Performed at [facility], [address], [place], [place] [address]      Labs: Basic Metabolic Panel: Recent Labs  Lab 09/11/21 1129 09/12/21 0435  NA 134* 138  K 4.8 4.4  CL 104 109  CO2 18* 19*  GLUCOSE 89 91  BUN 73* 60*  CREATININE 3.61* 2.56*  CALCIUM 9.1 8.9   CBC: Recent Labs  Lab 09/11/21 1129  WBC 5.2  NEUTROABS 3.7  HGB 12.2*  HCT 35.9*  MCV 91.6  PLT  142*   Signed:  Barton Dubois MD.  [facility] 09/12/2021, 1:30 PM

## 2021-09-15 DIAGNOSIS — H532 Diplopia: Secondary | ICD-10-CM | POA: Diagnosis not present

## 2021-09-15 DIAGNOSIS — H02402 Unspecified ptosis of left eyelid: Secondary | ICD-10-CM | POA: Diagnosis not present

## 2021-09-17 DIAGNOSIS — I639 Cerebral infarction, unspecified: Secondary | ICD-10-CM | POA: Diagnosis not present

## 2021-09-17 DIAGNOSIS — I499 Cardiac arrhythmia, unspecified: Secondary | ICD-10-CM | POA: Diagnosis not present

## 2021-09-17 DIAGNOSIS — N179 Acute kidney failure, unspecified: Secondary | ICD-10-CM | POA: Diagnosis not present

## 2021-09-17 DIAGNOSIS — G894 Chronic pain syndrome: Secondary | ICD-10-CM | POA: Diagnosis not present

## 2021-09-17 DIAGNOSIS — R630 Anorexia: Secondary | ICD-10-CM | POA: Diagnosis not present

## 2021-09-17 DIAGNOSIS — Z20822 Contact with and (suspected) exposure to covid-19: Secondary | ICD-10-CM | POA: Diagnosis not present

## 2021-09-17 DIAGNOSIS — G319 Degenerative disease of nervous system, unspecified: Secondary | ICD-10-CM | POA: Diagnosis not present

## 2021-09-17 DIAGNOSIS — R197 Diarrhea, unspecified: Secondary | ICD-10-CM | POA: Diagnosis present

## 2021-09-17 DIAGNOSIS — Z8572 Personal history of non-Hodgkin lymphomas: Secondary | ICD-10-CM | POA: Diagnosis not present

## 2021-09-17 DIAGNOSIS — R4781 Slurred speech: Secondary | ICD-10-CM | POA: Diagnosis not present

## 2021-09-17 DIAGNOSIS — G459 Transient cerebral ischemic attack, unspecified: Secondary | ICD-10-CM | POA: Diagnosis not present

## 2021-09-17 DIAGNOSIS — H532 Diplopia: Secondary | ICD-10-CM | POA: Diagnosis not present

## 2021-09-18 DIAGNOSIS — R4781 Slurred speech: Secondary | ICD-10-CM | POA: Diagnosis not present

## 2021-09-18 DIAGNOSIS — N400 Enlarged prostate without lower urinary tract symptoms: Secondary | ICD-10-CM | POA: Diagnosis not present

## 2021-09-18 DIAGNOSIS — R197 Diarrhea, unspecified: Secondary | ICD-10-CM | POA: Diagnosis not present

## 2021-09-18 DIAGNOSIS — H532 Diplopia: Secondary | ICD-10-CM | POA: Diagnosis not present

## 2021-09-21 ENCOUNTER — Telehealth: Payer: Self-pay

## 2021-09-21 DIAGNOSIS — N19 Unspecified kidney failure: Secondary | ICD-10-CM

## 2021-09-21 DIAGNOSIS — N400 Enlarged prostate without lower urinary tract symptoms: Secondary | ICD-10-CM | POA: Diagnosis not present

## 2021-09-21 DIAGNOSIS — M4856XA Collapsed vertebra, not elsewhere classified, lumbar region, initial encounter for fracture: Secondary | ICD-10-CM | POA: Diagnosis not present

## 2021-09-21 DIAGNOSIS — M25521 Pain in right elbow: Secondary | ICD-10-CM | POA: Diagnosis not present

## 2021-09-21 DIAGNOSIS — G319 Degenerative disease of nervous system, unspecified: Secondary | ICD-10-CM | POA: Diagnosis not present

## 2021-09-21 DIAGNOSIS — G7000 Myasthenia gravis without (acute) exacerbation: Secondary | ICD-10-CM | POA: Diagnosis not present

## 2021-09-21 DIAGNOSIS — Z043 Encounter for examination and observation following other accident: Secondary | ICD-10-CM | POA: Diagnosis not present

## 2021-09-21 DIAGNOSIS — M549 Dorsalgia, unspecified: Secondary | ICD-10-CM | POA: Diagnosis not present

## 2021-09-21 DIAGNOSIS — I6782 Cerebral ischemia: Secondary | ICD-10-CM | POA: Diagnosis not present

## 2021-09-21 DIAGNOSIS — R079 Chest pain, unspecified: Secondary | ICD-10-CM | POA: Diagnosis not present

## 2021-09-21 DIAGNOSIS — S3991XA Unspecified injury of abdomen, initial encounter: Secondary | ICD-10-CM | POA: Diagnosis not present

## 2021-09-21 DIAGNOSIS — K5730 Diverticulosis of large intestine without perforation or abscess without bleeding: Secondary | ICD-10-CM | POA: Diagnosis not present

## 2021-09-21 DIAGNOSIS — M545 Low back pain, unspecified: Secondary | ICD-10-CM | POA: Diagnosis not present

## 2021-09-21 NOTE — Telephone Encounter (Signed)
Daughter of patient called in reguard to medication prescribed by Dr. Alyson Ingles to help patient with urinary frequency. Patient was advised to take medication for 4 weeks and return for a follow up. Patients daughter advised its been 2 weeks and he is urinating every 2 hours which is concerning them. She just wanted to update the clinical staff and well as Dr. Alyson Ingles

## 2021-09-22 NOTE — Telephone Encounter (Signed)
I spoke with daughter- reports since starting uroxatrol his symptoms have not improved. Tomorrow marks two week of starting medication, Symptoms have not worsened either. Please advise. Patient scheduled to f/u in 4 weeks from OV date to review symptoms of medication

## 2021-09-24 NOTE — Telephone Encounter (Signed)
Called daughter and left message to continue medication for 4 weeks and keep scheduled OV appt.

## 2021-09-25 DIAGNOSIS — Z9181 History of falling: Secondary | ICD-10-CM | POA: Diagnosis not present

## 2021-10-07 ENCOUNTER — Ambulatory Visit: Payer: Medicare Other | Admitting: Urology

## 2021-10-07 ENCOUNTER — Other Ambulatory Visit: Payer: Self-pay

## 2021-10-07 ENCOUNTER — Encounter: Payer: Self-pay | Admitting: Urology

## 2021-10-07 VITALS — BP 132/69 | HR 83

## 2021-10-07 DIAGNOSIS — R350 Frequency of micturition: Secondary | ICD-10-CM

## 2021-10-07 DIAGNOSIS — N400 Enlarged prostate without lower urinary tract symptoms: Secondary | ICD-10-CM | POA: Diagnosis not present

## 2021-10-07 LAB — BLADDER SCAN AMB NON-IMAGING: PVR: 108 WU

## 2021-10-07 MED ORDER — SILODOSIN 8 MG PO CAPS: 8.0000 mg | ORAL_CAPSULE | Freq: Every day | ORAL | 11 refills | Status: DC

## 2021-10-07 NOTE — Progress Notes (Signed)
Urological Symptom Review  Patient is experiencing the following symptoms: Frequent urination Hard to postpone urination Get up at night to urinate   Review of Systems  Gastrointestinal (upper)  : Negative for upper GI symptoms  Gastrointestinal (lower) : Negative for lower GI symptoms  Constitutional : Weight loss  Skin: Negative for skin symptoms  Eyes: Negative for eye symptoms  Ear/Nose/Throat : Negative for Ear/Nose/Throat symptoms  Hematologic/Lymphatic: Negative for Hematologic/Lymphatic symptoms  Cardiovascular : Negative for cardiovascular symptoms  Respiratory : Negative for respiratory symptoms  Endocrine: Negative for endocrine symptoms  Musculoskeletal: Back pain  Neurological: Dizziness  Psychologic: Negative for psychiatric symptoms

## 2021-10-07 NOTE — Progress Notes (Signed)
post void residual =108ml 

## 2021-10-07 NOTE — Progress Notes (Signed)
10/07/2021 1:27 PM   Paul Keith 09-30-1930 [id number]  Referring provider: Scotty Court, DO [address] [place],  [place] [address]  Followup BPh and urinary frequency   HPI: [name] is a 87yo here for followup for BPH with urinary frequency. He was started on uroxatral and noted no change in his urinary frequency or nocturia. IPSS 7 QOL5. Nocturia 4-5x. Urine stream weak. He is unhappy with his response to uroxatral. 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 If you have any questions, ask your nurse or doctor.          STOP taking these medications    cholecalciferol 1000 units tablet Commonly known as: VITAMIN D Stopped by: Nicolette Bang, MD   meclizine 25 MG tablet Commonly known as: ANTIVERT Stopped by: Nicolette Bang, MD   rosuvastatin 20 MG tablet Commonly known as: CRESTOR Stopped by: Nicolette Bang, MD   sildenafil 20 MG tablet Commonly known as: REVATIO Stopped by: Nicolette Bang, MD   traMADol 50 MG tablet Commonly known as: ULTRAM Stopped by: Nicolette Bang, MD       TAKE these medications    acetaminophen 500 MG tablet Commonly known as: TYLENOL Take 1,000 mg by mouth every 6 (six) hours as needed for mild pain.   alfuzosin 10 MG 24 hr tablet Commonly known as: UROXATRAL Take 1 tablet (10 mg total) by mouth at bedtime.   aspirin 81 MG chewable tablet Chew by  mouth.   finasteride 5 MG tablet Commonly known as: PROSCAR Take 1 tablet (5 mg total) by mouth daily.   gabapentin 300 MG capsule Commonly known as: NEURONTIN 300 mg qHS 1 week, 300 mg BID 1 week, 300 mg TID from then on   multivitamin with minerals Tabs tablet Take 1 tablet by mouth daily.   pyridostigmine 60 MG tablet Commonly known as: MESTINON Take 30 mg by mouth 3 (three) times daily.        Allergies: No Known Allergies  Family History: No family history on file.  Social History:  reports that he quit smoking about 59 years ago. His smoking use included cigarettes. He has never used smokeless tobacco. He reports that he does not currently use alcohol. He reports that he does not use drugs.  ROS: All other review of systems were reviewed and are negative except what is noted above in HPI  Physical Exam: BP 132/69   Pulse 83   Constitutional:  Alert and oriented, No acute distress. HEENT: [place] AT, moist mucus membranes.  Trachea midline, no masses. Cardiovascular: No clubbing, cyanosis, or edema. Respiratory: Normal respiratory effort, no increased work of breathing. GI: Abdomen is soft, nontender, nondistended, no abdominal masses GU: No CVA tenderness.  Lymph: No cervical or inguinal lymphadenopathy. Skin: No rashes, bruises or suspicious lesions. Neurologic: Grossly intact, no focal deficits, moving all 4 extremities. Psychiatric: Normal  mood and affect.  Laboratory Data: Lab Results  Component Value Date   WBC 5.2 09/11/2021   HGB 12.2 (L) 09/11/2021   HCT 35.9 (L) 09/11/2021   MCV 91.6 09/11/2021   PLT 142 (L) 09/11/2021    Lab Results  Component Value Date   CREATININE 2.56 (H) 09/12/2021    No results found for: PSA  No results found for: TESTOSTERONE  No results found for: HGBA1C  Urinalysis    Component Value Date/Time   COLORURINE STRAW (A) 09/11/2021 1428   APPEARANCEUR CLEAR 09/11/2021 1428   LABSPEC 1.012 09/11/2021 1428    PHURINE 5.0 09/11/2021 1428   GLUCOSEU 50 (A) 09/11/2021 1428   HGBUR SMALL (A) 09/11/2021 1428   BILIRUBINUR NEGATIVE 09/11/2021 [place] 09/11/2021 1428   PROTEINUR 30 (A) 09/11/2021 1428   NITRITE NEGATIVE 09/11/2021 [place] 09/11/2021 1428    Lab Results  Component Value Date   BACTERIA RARE (A) 09/11/2021    Pertinent Imaging:  No results found for this or any previous visit.  No results found for this or any previous visit.  No results found for this or any previous visit.  No results found for this or any previous visit.  Results for orders placed during the hospital encounter of 09/11/21  US RENAL  Narrative CLINICAL DATA:  Acute renal failure  EXAM: RENAL / URINARY TRACT ULTRASOUND COMPLETE  COMPARISON:  CT abdomen/pelvis 08/25/2016  FINDINGS: Right Kidney:  Renal measurements: 9.3 cm x 4.3 cm x 4.7 cm = volume: 97 mL. Echogenicity within normal limits. No mass or hydronephrosis visualized.  Left Kidney:  Renal measurements: 10.8 cm x 5.1 cm x 4.5 cm = volume: 129 mL. Echogenicity within normal limits. No mass or hydronephrosis visualized.  Bladder:  The bladder wall appears abnormally thickened and trabeculated. The postvoid residual measured 275 cc. The prostate appears mildly enlarged.  Other:  None.  IMPRESSION: 1. Thickened and trabeculated bladder wall with a significant postvoid residual of 275 cc suggestive of outlet obstruction, possibly by an enlarged prostate. 2. Normal appearance of the kidneys with no hydronephrosis.   Electronically Signed By: Valetta Mole M.D. On: 09/11/2021 15:12  No results found for this or any previous visit.  No results found for this or any previous visit.  No results found for this or any previous visit.   Assessment & Plan:    1. Urinary frequency -we will trial rapaflo 8mg  qhs - BLADDER SCAN AMB NON-IMAGING  2. Benign prostatic hyperplasia,  unspecified whether lower urinary tract symptoms present -Rapaflo 8mg  qhs. If this fails to improve his LUTS then we will proceed with cystoscopy. - Urinalysis, Routine w reflex microscopic   No follow-ups on file.  Nicolette Bang, MD  [facility] [place]

## 2021-10-07 NOTE — Patient Instructions (Signed)

## 2021-10-19 DIAGNOSIS — S3991XA Unspecified injury of abdomen, initial encounter: Secondary | ICD-10-CM | POA: Diagnosis not present

## 2021-10-19 DIAGNOSIS — R0689 Other abnormalities of breathing: Secondary | ICD-10-CM | POA: Diagnosis not present

## 2021-10-19 DIAGNOSIS — M1711 Unilateral primary osteoarthritis, right knee: Secondary | ICD-10-CM | POA: Diagnosis not present

## 2021-10-19 DIAGNOSIS — C8300 Small cell B-cell lymphoma, unspecified site: Secondary | ICD-10-CM | POA: Diagnosis not present

## 2021-10-19 DIAGNOSIS — S32019A Unspecified fracture of first lumbar vertebra, initial encounter for closed fracture: Secondary | ICD-10-CM | POA: Diagnosis not present

## 2021-10-19 DIAGNOSIS — M542 Cervicalgia: Secondary | ICD-10-CM | POA: Diagnosis not present

## 2021-10-19 DIAGNOSIS — S2241XA Multiple fractures of ribs, right side, initial encounter for closed fracture: Secondary | ICD-10-CM | POA: Diagnosis not present

## 2021-10-19 DIAGNOSIS — R1084 Generalized abdominal pain: Secondary | ICD-10-CM | POA: Diagnosis not present

## 2021-10-19 DIAGNOSIS — D649 Anemia, unspecified: Secondary | ICD-10-CM | POA: Diagnosis not present

## 2021-10-19 DIAGNOSIS — R404 Transient alteration of awareness: Secondary | ICD-10-CM | POA: Diagnosis not present

## 2021-10-19 DIAGNOSIS — E871 Hypo-osmolality and hyponatremia: Secondary | ICD-10-CM | POA: Diagnosis not present

## 2021-10-19 DIAGNOSIS — R296 Repeated falls: Secondary | ICD-10-CM | POA: Insufficient documentation

## 2021-10-19 DIAGNOSIS — R7989 Other specified abnormal findings of blood chemistry: Secondary | ICD-10-CM | POA: Diagnosis not present

## 2021-10-19 DIAGNOSIS — R4182 Altered mental status, unspecified: Secondary | ICD-10-CM | POA: Diagnosis not present

## 2021-10-19 DIAGNOSIS — N3289 Other specified disorders of bladder: Secondary | ICD-10-CM | POA: Diagnosis not present

## 2021-10-19 DIAGNOSIS — M47812 Spondylosis without myelopathy or radiculopathy, cervical region: Secondary | ICD-10-CM | POA: Diagnosis not present

## 2021-10-19 DIAGNOSIS — K5730 Diverticulosis of large intestine without perforation or abscess without bleeding: Secondary | ICD-10-CM | POA: Diagnosis not present

## 2021-10-19 DIAGNOSIS — M1712 Unilateral primary osteoarthritis, left knee: Secondary | ICD-10-CM | POA: Diagnosis not present

## 2021-10-19 DIAGNOSIS — S32008A Other fracture of unspecified lumbar vertebra, initial encounter for closed fracture: Secondary | ICD-10-CM | POA: Diagnosis not present

## 2021-10-19 DIAGNOSIS — R410 Disorientation, unspecified: Secondary | ICD-10-CM | POA: Diagnosis not present

## 2021-10-20 DIAGNOSIS — E860 Dehydration: Secondary | ICD-10-CM | POA: Diagnosis not present

## 2021-10-20 DIAGNOSIS — C9110 Chronic lymphocytic leukemia of B-cell type not having achieved remission: Secondary | ICD-10-CM | POA: Diagnosis present

## 2021-10-20 DIAGNOSIS — R7989 Other specified abnormal findings of blood chemistry: Secondary | ICD-10-CM | POA: Diagnosis not present

## 2021-10-20 DIAGNOSIS — N400 Enlarged prostate without lower urinary tract symptoms: Secondary | ICD-10-CM | POA: Diagnosis not present

## 2021-10-20 DIAGNOSIS — R4182 Altered mental status, unspecified: Secondary | ICD-10-CM | POA: Diagnosis not present

## 2021-10-20 DIAGNOSIS — N182 Chronic kidney disease, stage 2 (mild): Secondary | ICD-10-CM | POA: Insufficient documentation

## 2021-10-21 DIAGNOSIS — N182 Chronic kidney disease, stage 2 (mild): Secondary | ICD-10-CM | POA: Diagnosis not present

## 2021-10-21 DIAGNOSIS — R7989 Other specified abnormal findings of blood chemistry: Secondary | ICD-10-CM | POA: Diagnosis not present

## 2021-10-21 DIAGNOSIS — E871 Hypo-osmolality and hyponatremia: Secondary | ICD-10-CM | POA: Diagnosis not present

## 2021-10-21 DIAGNOSIS — N400 Enlarged prostate without lower urinary tract symptoms: Secondary | ICD-10-CM | POA: Diagnosis not present

## 2021-10-22 DIAGNOSIS — C8300 Small cell B-cell lymphoma, unspecified site: Secondary | ICD-10-CM | POA: Diagnosis not present

## 2021-10-22 DIAGNOSIS — R7989 Other specified abnormal findings of blood chemistry: Secondary | ICD-10-CM | POA: Diagnosis not present

## 2021-10-22 DIAGNOSIS — N400 Enlarged prostate without lower urinary tract symptoms: Secondary | ICD-10-CM | POA: Diagnosis not present

## 2021-10-22 DIAGNOSIS — R410 Disorientation, unspecified: Secondary | ICD-10-CM | POA: Diagnosis not present

## 2021-10-26 DIAGNOSIS — M5416 Radiculopathy, lumbar region: Secondary | ICD-10-CM | POA: Diagnosis not present

## 2021-10-29 ENCOUNTER — Ambulatory Visit: Payer: Medicare Other | Admitting: Cardiology

## 2021-11-02 ENCOUNTER — Emergency Department (HOSPITAL_COMMUNITY): Payer: Medicare Other

## 2021-11-02 ENCOUNTER — Other Ambulatory Visit: Payer: Self-pay

## 2021-11-02 ENCOUNTER — Inpatient Hospital Stay (HOSPITAL_COMMUNITY)
Admission: EM | Admit: 2021-11-02 | Discharge: 2021-11-05 | DRG: 871 | Disposition: A | Discharge status: Hospice | Payer: Medicare Other | Source: Skilled Nursing Facility | Attending: Family Medicine | Admitting: Family Medicine

## 2021-11-02 ENCOUNTER — Encounter (HOSPITAL_COMMUNITY): Payer: Self-pay

## 2021-11-02 DIAGNOSIS — R6520 Severe sepsis without septic shock: Secondary | ICD-10-CM | POA: Diagnosis present

## 2021-11-02 DIAGNOSIS — A419 Sepsis, unspecified organism: Principal | ICD-10-CM | POA: Diagnosis present

## 2021-11-02 DIAGNOSIS — R296 Repeated falls: Secondary | ICD-10-CM | POA: Diagnosis present

## 2021-11-02 DIAGNOSIS — I4510 Unspecified right bundle-branch block: Secondary | ICD-10-CM | POA: Diagnosis present

## 2021-11-02 DIAGNOSIS — C9111 Chronic lymphocytic leukemia of B-cell type in remission: Secondary | ICD-10-CM | POA: Diagnosis present

## 2021-11-02 DIAGNOSIS — R1310 Dysphagia, unspecified: Secondary | ICD-10-CM | POA: Diagnosis present

## 2021-11-02 DIAGNOSIS — Z7982 Long term (current) use of aspirin: Secondary | ICD-10-CM

## 2021-11-02 DIAGNOSIS — G7000 Myasthenia gravis without (acute) exacerbation: Secondary | ICD-10-CM | POA: Diagnosis present

## 2021-11-02 DIAGNOSIS — N1832 Chronic kidney disease, stage 3b: Secondary | ICD-10-CM | POA: Diagnosis present

## 2021-11-02 DIAGNOSIS — L8990 Pressure ulcer of unspecified site, unspecified stage: Secondary | ICD-10-CM | POA: Diagnosis present

## 2021-11-02 DIAGNOSIS — R197 Diarrhea, unspecified: Secondary | ICD-10-CM

## 2021-11-02 DIAGNOSIS — J189 Pneumonia, unspecified organism: Secondary | ICD-10-CM | POA: Diagnosis present

## 2021-11-02 DIAGNOSIS — K869 Disease of pancreas, unspecified: Secondary | ICD-10-CM | POA: Diagnosis present

## 2021-11-02 DIAGNOSIS — Z20822 Contact with and (suspected) exposure to covid-19: Secondary | ICD-10-CM | POA: Diagnosis present

## 2021-11-02 DIAGNOSIS — E43 Unspecified severe protein-calorie malnutrition: Secondary | ICD-10-CM | POA: Diagnosis present

## 2021-11-02 DIAGNOSIS — Z87891 Personal history of nicotine dependence: Secondary | ICD-10-CM

## 2021-11-02 DIAGNOSIS — R748 Abnormal levels of other serum enzymes: Secondary | ICD-10-CM | POA: Diagnosis present

## 2021-11-02 DIAGNOSIS — J9601 Acute respiratory failure with hypoxia: Secondary | ICD-10-CM | POA: Diagnosis present

## 2021-11-02 DIAGNOSIS — D472 Monoclonal gammopathy: Secondary | ICD-10-CM | POA: Diagnosis present

## 2021-11-02 DIAGNOSIS — E860 Dehydration: Secondary | ICD-10-CM | POA: Diagnosis present

## 2021-11-02 DIAGNOSIS — L89612 Pressure ulcer of right heel, stage 2: Secondary | ICD-10-CM | POA: Diagnosis present

## 2021-11-02 DIAGNOSIS — W19XXXD Unspecified fall, subsequent encounter: Secondary | ICD-10-CM | POA: Diagnosis present

## 2021-11-02 DIAGNOSIS — Y95 Nosocomial condition: Secondary | ICD-10-CM | POA: Diagnosis present

## 2021-11-02 DIAGNOSIS — N400 Enlarged prostate without lower urinary tract symptoms: Secondary | ICD-10-CM | POA: Diagnosis present

## 2021-11-02 DIAGNOSIS — Z515 Encounter for palliative care: Secondary | ICD-10-CM | POA: Diagnosis not present

## 2021-11-02 DIAGNOSIS — Z8249 Family history of ischemic heart disease and other diseases of the circulatory system: Secondary | ICD-10-CM

## 2021-11-02 DIAGNOSIS — R627 Adult failure to thrive: Secondary | ICD-10-CM | POA: Diagnosis present

## 2021-11-02 DIAGNOSIS — R7401 Elevation of levels of liver transaminase levels: Secondary | ICD-10-CM | POA: Diagnosis present

## 2021-11-02 DIAGNOSIS — C9110 Chronic lymphocytic leukemia of B-cell type not having achieved remission: Secondary | ICD-10-CM | POA: Diagnosis not present

## 2021-11-02 DIAGNOSIS — Z681 Body mass index (BMI) 19 or less, adult: Secondary | ICD-10-CM | POA: Diagnosis not present

## 2021-11-02 DIAGNOSIS — L89152 Pressure ulcer of sacral region, stage 2: Secondary | ICD-10-CM | POA: Diagnosis present

## 2021-11-02 DIAGNOSIS — Z66 Do not resuscitate: Secondary | ICD-10-CM | POA: Diagnosis present

## 2021-11-02 DIAGNOSIS — R7989 Other specified abnormal findings of blood chemistry: Secondary | ICD-10-CM | POA: Diagnosis present

## 2021-11-02 DIAGNOSIS — Z9842 Cataract extraction status, left eye: Secondary | ICD-10-CM

## 2021-11-02 DIAGNOSIS — N390 Urinary tract infection, site not specified: Secondary | ICD-10-CM | POA: Diagnosis present

## 2021-11-02 DIAGNOSIS — Z7189 Other specified counseling: Secondary | ICD-10-CM | POA: Diagnosis not present

## 2021-11-02 DIAGNOSIS — S2241XD Multiple fractures of ribs, right side, subsequent encounter for fracture with routine healing: Secondary | ICD-10-CM

## 2021-11-02 DIAGNOSIS — Z8572 Personal history of non-Hodgkin lymphomas: Secondary | ICD-10-CM

## 2021-11-02 DIAGNOSIS — R188 Other ascites: Secondary | ICD-10-CM | POA: Diagnosis present

## 2021-11-02 DIAGNOSIS — Z79899 Other long term (current) drug therapy: Secondary | ICD-10-CM

## 2021-11-02 DIAGNOSIS — R64 Cachexia: Secondary | ICD-10-CM | POA: Diagnosis present

## 2021-11-02 DIAGNOSIS — J690 Pneumonitis due to inhalation of food and vomit: Secondary | ICD-10-CM | POA: Diagnosis present

## 2021-11-02 DIAGNOSIS — E785 Hyperlipidemia, unspecified: Secondary | ICD-10-CM | POA: Diagnosis present

## 2021-11-02 DIAGNOSIS — Z9841 Cataract extraction status, right eye: Secondary | ICD-10-CM

## 2021-11-02 LAB — BLOOD GAS, ARTERIAL
Acid-base deficit: 2 mmol/L (ref 0.0–2.0)
Bicarbonate: 21.7 mmol/L (ref 20.0–28.0)
Drawn by: 27733
FIO2: 80
O2 Saturation: 47.1 %
Patient temperature: 37.7
pCO2 arterial: 44.3 mmHg (ref 32.0–48.0)
pH, Arterial: 7.336 — ABNORMAL LOW (ref 7.350–7.450)
pO2, Arterial: 37.2 mmHg — CL (ref 83.0–108.0)

## 2021-11-02 LAB — COMPREHENSIVE METABOLIC PANEL
ALT: 79 U/L — ABNORMAL HIGH (ref 0–44)
AST: 113 U/L — ABNORMAL HIGH (ref 15–41)
Albumin: 2.8 g/dL — ABNORMAL LOW (ref 3.5–5.0)
Alkaline Phosphatase: 121 U/L (ref 38–126)
Anion gap: 12 (ref 5–15)
BUN: 70 mg/dL — ABNORMAL HIGH (ref 8–23)
CO2: 24 mmol/L (ref 22–32)
Calcium: 9.4 mg/dL (ref 8.9–10.3)
Chloride: 105 mmol/L (ref 98–111)
Creatinine, Ser: 1.66 mg/dL — ABNORMAL HIGH (ref 0.61–1.24)
GFR, Estimated: 39 mL/min — ABNORMAL LOW (ref >60)
Glucose, Bld: 117 mg/dL — ABNORMAL HIGH (ref 70–99)
Potassium: 4.3 mmol/L (ref 3.5–5.1)
Sodium: 141 mmol/L (ref 135–145)
Total Bilirubin: 1.1 mg/dL (ref 0.3–1.2)
Total Protein: 6.9 g/dL (ref 6.5–8.1)

## 2021-11-02 LAB — URINALYSIS, ROUTINE W REFLEX MICROSCOPIC
Bilirubin Urine: NEGATIVE
Glucose, UA: NEGATIVE mg/dL
Ketones, ur: NEGATIVE mg/dL
Nitrite: NEGATIVE
Protein, ur: 30 mg/dL — AB
Specific Gravity, Urine: 1.018 (ref 1.005–1.030)
pH: 5 (ref 5.0–8.0)

## 2021-11-02 LAB — C DIFFICILE QUICK SCREEN W PCR REFLEX
C Diff antigen: NEGATIVE
C Diff interpretation: NOT DETECTED
C Diff toxin: NEGATIVE

## 2021-11-02 LAB — CBC WITH DIFFERENTIAL/PLATELET
Band Neutrophils: 1 %
Basophils Absolute: 0 10*3/uL (ref 0.0–0.1)
Basophils Relative: 0 %
Eosinophils Absolute: 0 10*3/uL (ref 0.0–0.5)
Eosinophils Relative: 0 %
HCT: 36.2 % — ABNORMAL LOW (ref 39.0–52.0)
Hemoglobin: 11.3 g/dL — ABNORMAL LOW (ref 13.0–17.0)
Lymphocytes Relative: 5 %
Lymphs Abs: 0.9 10*3/uL (ref 0.7–4.0)
MCH: 29 pg (ref 26.0–34.0)
MCHC: 31.2 g/dL (ref 30.0–36.0)
MCV: 93.1 fL (ref 80.0–100.0)
Monocytes Absolute: 0.7 10*3/uL (ref 0.1–1.0)
Monocytes Relative: 4 %
Neutro Abs: 16.9 10*3/uL — ABNORMAL HIGH (ref 1.7–7.7)
Neutrophils Relative %: 90 %
Platelets: 323 10*3/uL (ref 150–400)
RBC: 3.89 MIL/uL — ABNORMAL LOW (ref 4.22–5.81)
RDW: 14.3 % (ref 11.5–15.5)
WBC: 18.6 10*3/uL — ABNORMAL HIGH (ref 4.0–10.5)
nRBC: 0 % (ref 0.0–0.2)

## 2021-11-02 LAB — PROTIME-INR
INR: 1.3 — ABNORMAL HIGH (ref 0.8–1.2)
Prothrombin Time: 15.9 seconds — ABNORMAL HIGH (ref 11.4–15.2)

## 2021-11-02 LAB — RESP PANEL BY RT-PCR (FLU A&B, COVID) ARPGX2
Influenza A by PCR: NEGATIVE
Influenza B by PCR: NEGATIVE
SARS Coronavirus 2 by RT PCR: NEGATIVE

## 2021-11-02 LAB — AMMONIA: Ammonia: 23 umol/L (ref 9–35)

## 2021-11-02 LAB — TROPONIN I (HIGH SENSITIVITY)
Troponin I (High Sensitivity): 30 ng/L — ABNORMAL HIGH (ref <18)
Troponin I (High Sensitivity): 18 ng/L — ABNORMAL HIGH (ref <18)

## 2021-11-02 LAB — LACTIC ACID, PLASMA
Lactic Acid, Venous: 2.1 mmol/L (ref 0.5–1.9)
Lactic Acid, Venous: 1.5 mmol/L (ref 0.5–1.9)

## 2021-11-02 LAB — APTT: aPTT: 28 seconds (ref 24–36)

## 2021-11-02 MED ORDER — VANCOMYCIN HCL 1250 MG/250ML IV SOLN
1250.0000 mg | Freq: Once | INTRAVENOUS | Status: AC
  Administered 2021-11-02: 15:00:00 1250 mg via INTRAVENOUS
  Filled 2021-11-02: qty 250

## 2021-11-02 MED ORDER — IOHEXOL 300 MG/ML  SOLN
75.0000 mL | Freq: Once | INTRAMUSCULAR | Status: AC | PRN
  Administered 2021-11-02: 16:00:00 75 mL via INTRAVENOUS

## 2021-11-02 MED ORDER — SODIUM CHLORIDE 0.9 % IV SOLN
500.0000 mg | INTRAVENOUS | Status: DC
  Administered 2021-11-02 – 2021-11-04 (×3): 500 mg via INTRAVENOUS
  Filled 2021-11-02 (×5): qty 5

## 2021-11-02 MED ORDER — VANCOMYCIN HCL IN DEXTROSE 1-5 GM/200ML-% IV SOLN: 1000.0000 mg | INTRAVENOUS | Status: DC

## 2021-11-02 MED ORDER — SODIUM CHLORIDE 0.9 % IV SOLN
3.0000 g | Freq: Two times a day (BID) | INTRAVENOUS | Status: DC
  Administered 2021-11-02: 21:00:00 3 g via INTRAVENOUS
  Filled 2021-11-02 (×5): qty 8

## 2021-11-02 MED ORDER — ONDANSETRON HCL 4 MG/2ML IJ SOLN: 4.0000 mg | Freq: Four times a day (QID) | INTRAMUSCULAR | Status: DC | PRN

## 2021-11-02 MED ORDER — LACTATED RINGERS IV SOLN: INTRAVENOUS | Status: DC

## 2021-11-02 MED ORDER — ACETAMINOPHEN 325 MG PO TABS
650.0000 mg | ORAL_TABLET | Freq: Four times a day (QID) | ORAL | Status: DC | PRN
  Administered 2021-11-03: 18:00:00 650 mg via ORAL
  Filled 2021-11-02: qty 2

## 2021-11-02 MED ORDER — SODIUM CHLORIDE 0.9 % IV SOLN
2.0000 g | INTRAVENOUS | Status: DC
  Administered 2021-11-02: 14:00:00 2 g via INTRAVENOUS
  Filled 2021-11-02: qty 20

## 2021-11-02 MED ORDER — HEPARIN SODIUM (PORCINE) 5000 UNIT/ML IJ SOLN
5000.0000 [IU] | Freq: Three times a day (TID) | INTRAMUSCULAR | Status: DC
  Administered 2021-11-02 – 2021-11-04 (×6): 5000 [IU] via SUBCUTANEOUS
  Filled 2021-11-02 (×6): qty 1

## 2021-11-02 MED ORDER — ACETAMINOPHEN 650 MG RE SUPP: 650.0000 mg | Freq: Four times a day (QID) | RECTAL | Status: DC | PRN

## 2021-11-02 MED ORDER — ONDANSETRON HCL 4 MG PO TABS: 4.0000 mg | ORAL_TABLET | Freq: Four times a day (QID) | ORAL | Status: DC | PRN

## 2021-11-02 MED ORDER — LACTATED RINGERS IV SOLN: INTRAVENOUS | Status: AC

## 2021-11-02 MED ORDER — LACTATED RINGERS IV BOLUS (SEPSIS)
1000.0000 mL | Freq: Once | INTRAVENOUS | Status: AC
  Administered 2021-11-02: 13:00:00 1000 mL via INTRAVENOUS

## 2021-11-02 MED ORDER — LACTATED RINGERS IV BOLUS
1000.0000 mL | Freq: Once | INTRAVENOUS | Status: AC
  Administered 2021-11-02: 16:00:00 1000 mL via INTRAVENOUS

## 2021-11-02 MED ORDER — CHLORHEXIDINE GLUCONATE CLOTH 2 % EX PADS
6.0000 | MEDICATED_PAD | Freq: Every day | CUTANEOUS | Status: DC
  Administered 2021-11-02 – 2021-11-04 (×3): 6 via TOPICAL

## 2021-11-02 MED ORDER — VANCOMYCIN HCL 750 MG/150ML IV SOLN: 750.0000 mg | INTRAVENOUS | Status: DC

## 2021-11-02 NOTE — Progress Notes (Signed)
Notified bedside nurse of need to draw repeat lactic acid. 

## 2021-11-02 NOTE — ED Provider Notes (Signed)
[facility] EMERGENCY DEPARTMENT Provider Note   CSN: [id number] Arrival date & time: 11/02/21  1233     History Chief Complaint  Patient presents with   Shortness of Breath    Paul Keith is a 87 y.o. male here for SOB  Level 5 caveat- Respiratory distress.  Lateral from daughter in room.  Patient was seen on 10/19/2021 at OSH, admitted for fall, altered mental status.  Subsequently discharged to [facility] on 12/8.  At discharge did have lumbar transverse process fractures, subacute fractures right eighth through 10th ribs and right 11th rib, they suspected was likely due to fall on 09/21/2021.  Daughter states patient had chest x-ray yesterday due to cough, fever was diagnosed with pneumonia.  Started on antibiotics this morning.  He has had a few days of generalized weakness, little p.o. intake.  Unsure of COVID, flu test was performed.  Here due to worsening respiratory status. 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   Pancytopenia ([place]) 04/22/2016    Patient Active Problem List   Diagnosis Date Noted   Stage 2 chronic kidney disease 10/20/2021   CLL (chronic lymphocytic leukemia) ([place]) 10/20/2021   Frequent falls 10/19/2021   Uremia 09/21/2021   Diarrhea 09/17/2021   Acute renal failure superimposed on stage 4 chronic kidney disease ([place]) 09/11/2021   HLD (hyperlipidemia) 09/11/2021   Myasthenia gravis ([place]) 09/11/2021   BPH (benign prostatic hyperplasia) 09/10/2021   Arthralgia of right knee 08/03/2021   Pneumonia, unspecified organism 04/24/2018   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 No pertinent family history.  Social History   Tobacco Use   Smoking status: Former    Types: Cigarettes    Quit date: 09/15/1962    Years since quitting: 59.1   Smokeless tobacco: Never  Substance Use Topics   Alcohol use: Not Currently    Comment: OCCASSIONAL   Drug use: No    Home Medications Prior to Admission medications   Medication Sig Start Date End Date Taking? Authorizing Provider  acetaminophen (TYLENOL) 500 MG tablet Take 1,000 mg by mouth every 6 (six) hours as needed for mild pain.    [provider]  alfuzosin (UROXATRAL) 10 MG 24 hr tablet Take 1 tablet (10 mg total) by mouth at bedtime. 09/07/21   McKenzie, Candee Furbish, MD  aspirin 81 MG chewable tablet Chew by mouth.    [provider]  finasteride (PROSCAR) 5 MG tablet Take 1 tablet (5 mg total) by mouth daily. 06/24/21   Hollice Espy, MD  gabapentin (NEURONTIN) 300 MG capsule 300 mg qHS 1 week, 300 mg BID 1 week, 300 mg TID from  then on 09/23/21   [provider]  Multiple Vitamin (MULTIVITAMIN WITH MINERALS) TABS tablet Take 1 tablet by mouth daily.    [provider]  pyridostigmine (MESTINON) 60 MG tablet Take 30 mg by mouth 3 (three) times daily. 09/03/21 09/23/21  [provider]  silodosin (RAPAFLO) 8 MG CAPS capsule Take 1 capsule (8 mg total) by mouth at bedtime. 10/07/21   McKenzie, Candee Furbish, MD    Allergies    Patient has no known allergies.  Review of Systems    Review of Systems  Constitutional:  Positive for activity change, appetite change, fatigue and fever.  HENT:  Positive for congestion and rhinorrhea. Negative for sore throat.   Respiratory:  Positive for cough and shortness of breath.   Cardiovascular: Negative.   Gastrointestinal: Negative.   Genitourinary: Negative.   Musculoskeletal:  Positive for myalgias.  Neurological:  Positive for weakness and light-headedness.  All other systems reviewed and are negative.  Physical Exam Updated Vital Signs BP 104/63 (BP Location: Right Arm)    Pulse (!) 110    Temp 99.8 F (37.7 C) (Rectal)    Resp (!) 26    SpO2 95%   Physical Exam Vitals and nursing note reviewed.  Constitutional:      General: He is in acute distress.     Appearance: He is ill-appearing and toxic-appearing.     Comments: Cachectic, ill-appearing,  HENT:     Head: Normocephalic.     Mouth/Throat:     Lips: Pink.     Mouth: Mucous membranes are dry.     Pharynx: Oropharynx is clear. Uvula midline.     Comments: Dry mucous membranes Neck:     Comments: Full ROM Cardiovascular:     Rate and Rhythm: Tachycardia present.     Pulses: Normal pulses.          Radial pulses are 2+ on the right side and 2+ on the left side.       Dorsalis pedis pulses are 2+ on the right side and 2+ on the left side.     Heart sounds: Normal heart sounds.  Pulmonary:     Effort: Tachypnea and respiratory distress present.     Comments: Coarse lung sounds throughout. Chest:     Comments: Non tender chest Abdominal:     General: Bowel sounds are normal.     Palpations: Abdomen is soft.     Tenderness: There is no abdominal tenderness.     Comments: Soft nontender  Musculoskeletal:     Cervical back: Full passive range of motion without pain and normal range of motion.     Comments: No bony tenderness, moves all 4 extremities without difficulty  Skin:    General: Skin is warm.     Capillary Refill: Capillary refill takes more than  3 seconds.     Comments: Old ecchymosis to flank, right  Neurological:     Comments: Cn grossly intact    ED Results / Procedures / Treatments   Labs (all labs ordered are listed, but only abnormal results are displayed) Labs Reviewed  CULTURE, BLOOD (ROUTINE X 2)  CULTURE, BLOOD (ROUTINE X 2)  RESP PANEL BY RT-PCR (FLU A&B, COVID) ARPGX2  URINE CULTURE  COMPREHENSIVE METABOLIC PANEL  LACTIC ACID, PLASMA  LACTIC ACID, PLASMA  CBC WITH DIFFERENTIAL/PLATELET  PROTIME-INR  URINALYSIS, ROUTINE W REFLEX MICROSCOPIC  APTT    EKG None  Radiology No results found.  Procedures .Critical Care Performed by: Rubye Oaks  Kato Wieczorek A, PA-C Authorized by: Nettie Elm, PA-C   Critical care provider statement:    Critical care time (minutes):  45   Critical care time was exclusive of:  Separately billable procedures and treating other patients and teaching time   Critical care was necessary to treat or prevent imminent or life-threatening deterioration of the following conditions:  Respiratory failure and sepsis   Critical care was time spent personally by me on the following activities:  Development of treatment plan with patient or surrogate, discussions with consultants, evaluation of patient's response to treatment, examination of patient, ordering and review of laboratory studies, ordering and review of radiographic studies, ordering and performing treatments and interventions, pulse oximetry, re-evaluation of patient's condition, review of old charts and discussions with primary provider   Medications Ordered in ED Medications  lactated ringers infusion (has no administration in time range)  lactated ringers bolus 1,000 mL (has no administration in time range)  cefTRIAXone (ROCEPHIN) 2 g in sodium chloride 0.9 % 100 mL IVPB (has no administration in time range)  azithromycin (ZITHROMAX) 500 mg in sodium chloride 0.9 % 250 mL IVPB (has no administration in time range)    ED  Course  I have reviewed the triage vital signs and the nursing notes.  Pertinent labs & imaging results that were available during my care of the patient were reviewed by me and considered in my medical decision making (see chart for details).  87 year old here for evaluation of shortness of breath.  On arrival temperature 99.8, tachycardic, tachypneic, hypoxic.  Apparently diagnosed with pneumonia at living facility yesterday.  Code sepsis called on initial evaluation.  He has coarse lung sounds throughout.  Requiring nonrebreather.  Patient is DNR, DNI.  Will give antibiotics for upper respiratory source.  He does appear clinically dehydrated, will give fluids.  Daughter in room states patient did have a fall few weeks ago, I reviewed epic was seen and admitted at that time.  Had some lumbar spinous process fractures as well as some subacute rib fractures which they feel is likely from a fall 1 month prior when he was seen as well.  No additional falls that family knows of.  Plan on labs, imaging, patient critically ill will need admission.  Labs and imaging personally reviewed and interpreted: CBC leukocytosis 18.6 CMP creatinine 1.66, elevated LFTs Lactic 2.1>>1.5 Ammonia 23 DG chest PNA EKG tachy CT C/A/P with PNA, subacute fib fractures  Nursing states patient has had multiple episodes of pure liquid stools since arriving here.  We will get sample for C. difficile. >> neg   Patient reassessed.  Is still requiring 12 L via nonrebreather.  Will admit for acute hypoxic respiratory failure likely due to sepsis from pneumonia.  Patient critically ill.  Did discuss with patient and family in room patient is DNR, DNI   Clinical Course as of 11/02/21 1535  Mon Nov 02, 2021  1349 This is an 36-year male presenting from skilled nursing facility with concern for hypoxia and possible pneumonia, per his daughter at bedside this was diagnosed yesterday.  She noted significant deterioration of his  physical health and appearance over the past 24 hours, reports that he is hypoxic with labored breathing now.  The patient is on a nonrebreather mask on exam.  He is thin, frail, chronically ill-appearing and cachectic.  He does not have any audible wheezing but does have rhonchi in the lung bases.  Presentation shows tachycardia, hypoxia, leukocytosis, and does meet SIRS criteria.  Sepsis work-up ordered, as well as antibiotics [MT]  1350 EKG shows a sinus tachycardia per my interpretation without acute ST elevations to suggest STEMI [MT]    Clinical Course User Index [MT] Wyvonnia Dusky, MD   MDM Rules/Calculators/A&P                             Final Clinical Impression(s) / ED Diagnoses Final diagnoses:  Sepsis with acute hypoxic respiratory failure without septic shock, due to unspecified organism [facility])  Healthcare-associated pneumonia  Elevated LFTs    Rx / DC Orders ED Discharge Orders     None        Leta Bucklin A, PA-C 11/02/21 1915    Wyvonnia Dusky, MD 11/03/21 [phone]

## 2021-11-02 NOTE — Progress Notes (Signed)
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  Pharmacy has been consulted for unasyn and vancomycin dosing.  Plan: Unasyn 3 grams iv q12h Vancomycin 1000 mg iv q48h AUC (est): 525.3 mcg*hr/mL T 1/2: 28.9 hours Vd: 39.7 Liters Ke: 0.024 hr-1  Height: 6' (182.9 cm) Weight: 55.2 kg (121 lb 11.1 oz) IBW/kg (Calculated) : 77.6  Temp (24hrs), Avg:99 F (37.2 C), Min:98.1 F (36.7 C), Max:99.8 F (37.7 C)  Recent Labs  Lab 11/02/21 1323 11/02/21 1325 11/02/21 1528  WBC  --  18.6*  --   CREATININE  --  1.66*  --   LATICACIDVEN 2.1*  --  1.5    Estimated Creatinine Clearance: 23.6 mL/min (A) (by C-G formula based on SCr of 1.66 mg/dL (H)).    No Known Allergies  Antimicrobials this admission: 12/19 vanco >>  12/19 unasyn >>   12/19 azithromycin >>  Dose adjustments this admission: Unasyn 3 grams iv q8h >> unasyn 3 grams iv q12h. Adjusted  for renal function  Microbiology results: None resulted. Sputum and MRSA PCR have been ordered  Thank you for allowing pharmacy to be a part of this patients care.  Vaughan Basta BS, PharmD, BCPS Clinical Pharmacist 11/02/2021 8:23 PM

## 2021-11-02 NOTE — ED Notes (Signed)
Pt placed on cardiac monitor with BP to set cycle every 30 minutes. Continuous pulse oximeter applied.  

## 2021-11-02 NOTE — ED Notes (Signed)
Perineal care performed, pt has small volume liquid brown stool that continuously leaks out.

## 2021-11-02 NOTE — ED Triage Notes (Signed)
Pt diagnosed with pneumonia at [facility] on 11/01/2021, family requested he be reevaluated at ED today.

## 2021-11-02 NOTE — Progress Notes (Signed)
**Note De-Identified  Obfuscation** ABG collected and taken to lab for analysis.

## 2021-11-02 NOTE — Progress Notes (Signed)
Pharmacy Antibiotic Note  Paul Keith is a 87 y.o. male admitted on 11/02/2021 with pneumonia and sepsis.  Pharmacy has been consulted for vancomycin dosing.  Plan: Vancomycin 1250 mg IV x 1 dose. Vancomycin 750 mg IV every 24 hours. Monitor labs, c/s, and de-escalation.     Temp (24hrs), Avg:99.8 F (37.7 C), Min:99.8 F (37.7 C), Max:99.8 F (37.7 C)  Recent Labs  Lab 11/02/21 1323 11/02/21 1325  WBC  --  18.6*  CREATININE  --  1.66*  LATICACIDVEN 2.1*  --     CrCl cannot be calculated (Unknown ideal weight.).    No Known Allergies  Antimicrobials this admission: Vanco 12/19>> CTX 12/19 >> Azith 12/19 >>  Microbiology results: 12/19 BCx: pending 12/19 UCx: pending  12/19 Cdiff: pending  12/19 MRSA PCR: pending  Thank you for allowing pharmacy to be a part of this patients care.  Ramond Craver 11/02/2021 3:46 PM

## 2021-11-02 NOTE — Progress Notes (Signed)
Elink following code sepsis °

## 2021-11-02 NOTE — H&P (Signed)
History and Physical    Paul Keith XIP:[id number] DOB: 06/12/1930 DOA: 11/02/2021  PCP: Wilburt Finlay, MD   Patient coming from: [facility]  I have personally briefly reviewed patient's old medical records in [facility]  Chief Complaint: Cough,   HPI: Paul Keith is a 87 y.o. male with medical history significant for CLL and IgM gammopathy of undetermined significance, BPH, myasthenia gravis.  Patient reports that he was diagnosed with pneumonia yesterday.  Over the past 2 days, patient has not been eating, he also had a cough, and generalized weakness.  On my evaluation patient is awake alert and oriented to person place and situation.  No family is present.  He is on nonrebreather mask.  He has been having watery stools. He denies problems swallowing or episodes of aspiration.  He denies any pain.  Denies pain with urination.  EDP spoke to patient, and patient's daughter at bedside, unknown if patient has followed since propofol documented below.  Also confirmed DNR status.  Patient does not want to be intubated if his breathing gets worse.  He has a DNR form.  Per Care everywhere, patient was recently hospitalized 12/5 - 12/8 at [facility], for fall and disorientation.  Imaging showed stable chronic T1 compression deformity, acute nondisplaced fractures of the right L1 and L2 transverse processes.  Also showed Subacute fractures right eighth through 10th rib and right 11th rib, fractures likely sustained from a fall that had occurred earlier 11/7.  ED Course: Temperature 99.8.  Heart rate 91-129.  Respiratory rate 15-32.  Blood pressure systolic 67-341.  O2 sats down to 87% on 6 L.  He was placed on nonrebreather.  Sats currently > 94% on partial nonrebreather.  Chest x-ray shows new left perihilar airspace disease and patchy left retrocardiac opacity suggesting pneumonia.   CT chest abdomen and pelvis with contrast - Diffuse peribronchovascular ground-glass  and hypodense heterogeneous consolidative airspace opacities that are most prominent within the bilateral lower lobes. Also showed debris's within the trachea. IV Vanc, ceftriaxone and azithromycin was started.  2 L bolus given. Hospitalist to admit.   Review of Systems: As per HPI all other systems reviewed and negative.  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 His smoking use included cigarettes. He has never used smokeless tobacco. He reports that he does not currently use alcohol. He reports that he does not use drugs.  No Known Allergies  Family history of hypertension.  Prior to Admission medications   Medication Sig Start Date End Date Taking? Authorizing Provider  acetaminophen (TYLENOL) 500 MG tablet Take 1,000 mg by mouth every 6 (six) hours as needed for mild pain.   Yes [provider]  finasteride (PROSCAR) 5 MG tablet Take 1 tablet (5 mg total) by mouth daily. 06/24/21  Yes Hollice Espy, MD  gabapentin (NEURONTIN) 300 MG capsule Take 300 mg by mouth 3 (three) times daily. 09/23/21  Yes [provider]  Multiple Vitamin (MULTIVITAMIN WITH MINERALS) TABS tablet Take 1 tablet by mouth daily.   Yes [provider]  tamsulosin [facility])  0.4 MG CAPS capsule Take 1 capsule by mouth daily. 10/22/21  Yes [provider]  alfuzosin (UROXATRAL) 10 MG 24 hr tablet Take 1 tablet (10 mg total) by mouth at bedtime. Patient not taking: Reported on 11/02/2021 09/07/21   Cleon Gustin, MD  pyridostigmine (MESTINON) 60 MG tablet Take 30 mg by mouth 3  (three) times daily. Patient not taking: Reported on 11/02/2021 09/03/21 09/23/21  [provider]  silodosin (RAPAFLO) 8 MG CAPS capsule Take 1 capsule (8 mg total) by mouth at bedtime. Patient not taking: Reported on 11/02/2021 10/07/21   Cleon Gustin, MD    Physical Exam: Vitals:   11/02/21 1600 11/02/21 1615 11/02/21 1630 11/02/21 1700  BP: (!) 113/54  (!) 117/103 133/83  Pulse: 100 100 (!) 105 (!) 114  Resp: 20 19 (!) 32 (!) 25  Temp:      TempSrc:      SpO2: 93% 100% 96% 95%    Constitutional: Acutely ill-appearing,, appears weak, on nonrebreather  Vitals:   11/02/21 1600 11/02/21 1615 11/02/21 1630 11/02/21 1700  BP: (!) 113/54  (!) 117/103 133/83  Pulse: 100 100 (!) 105 (!) 114  Resp: 20 19 (!) 32 (!) 25  Temp:      TempSrc:      SpO2: 93% 100% 96% 95%   Eyes: Pupils pinpoint, equal, lids and conjunctivae normal ENMT: Mucous membranes are  Very dry  Neck: normal, supple, no masses, no thyromegaly Respiratory: Cough sounds weak, upper airway congested sounds 2/2 secretions, no wheezing no rhonchi appreciated.   Appears moderately dyspneic. Cardiovascular: tachycardic, regular rate and rhythm, no murmurs / rubs / gallops. No extremity edema. 2+ pedal pulses.  Abdomen: no tenderness, no masses palpated. No hepatosplenomegaly. Bowel sounds positive.  Musculoskeletal: no clubbing / cyanosis. No joint deformity upper and lower extremities. Good ROM, no contractures. Normal muscle tone.  Skin: no rashes, lesions, ulcers. No induration Neurologic: No apparent cranial nerve abnormality, moving extremities spontaneously. Psychiatric: Normal judgment and insight. Alert and oriented x 3. Normal mood.   Labs on Admission: I have personally reviewed following labs and imaging studies  CBC: Recent Labs  Lab 11/02/21 1325  WBC 18.6*  NEUTROABS 16.9*  HGB 11.3*  HCT 36.2*  MCV 93.1  PLT 409   Basic Metabolic Panel: Recent Labs  Lab 11/02/21 1325  NA 141   K 4.3  CL 105  CO2 24  GLUCOSE 117*  BUN 70*  CREATININE 1.66*  CALCIUM 9.4   GFR: CrCl cannot be calculated (Unknown ideal weight.). Liver Function Tests: Recent Labs  Lab 11/02/21 1325  AST 113*  ALT 79*  ALKPHOS 121  BILITOT 1.1  PROT 6.9  ALBUMIN 2.8*   No results for input(s): LIPASE, AMYLASE in the last 168 hours. Recent Labs  Lab 11/02/21 1328  AMMONIA 23   Coagulation Profile: Recent Labs  Lab 11/02/21 1325  INR 1.3*   Urine analysis:    Component Value Date/Time   COLORURINE YELLOW 11/02/2021 1613   APPEARANCEUR HAZY (A) 11/02/2021 1613   LABSPEC 1.018 11/02/2021 1613   PHURINE 5.0 11/02/2021 1613   GLUCOSEU NEGATIVE 11/02/2021 1613   HGBUR SMALL (A) 11/02/2021 1613   BILIRUBINUR NEGATIVE 11/02/2021 1613   [place] 11/02/2021 1613   PROTEINUR 30 (A) 11/02/2021 1613   NITRITE NEGATIVE 11/02/2021 1613   LEUKOCYTESUR MODERATE (A) 11/02/2021 1613    Radiological Exams on Admission: CT Head Wo Contrast  Result Date: 11/02/2021 CLINICAL DATA:  Sepsis.  Fall.  Head trauma. EXAM: CT HEAD WITHOUT CONTRAST TECHNIQUE: Contiguous axial images were obtained from the base of the skull through the vertex without intravenous contrast. COMPARISON:  10/19/2021 FINDINGS: Brain: Generalized brain atrophy. Mild chronic small-vessel ischemic change of the white matter. No sign of acute infarction, mass lesion, hemorrhage, hydrocephalus or extra-axial collection. Vascular: There is atherosclerotic calcification of the major vessels at the base of the brain. Skull: Negative Sinuses/Orbits: Clear/normal Other: None IMPRESSION: No acute or traumatic finding. Age related atrophy. Mild chronic small-vessel ischemic change of the white matter. Electronically Signed   By: Nelson Chimes M.D.   On: 11/02/2021 16:26   CT CHEST ABDOMEN PELVIS W CONTRAST  Result Date: 11/02/2021 CLINICAL DATA:  Polytrauma, blunt fall, hypoxia, sepsis EXAM: CT CHEST, ABDOMEN, AND PELVIS WITH  CONTRAST TECHNIQUE: Multidetector CT imaging of the chest, abdomen and pelvis was performed following the standard protocol during bolus administration of intravenous contrast. CONTRAST:  66mL OMNIPAQUE IOHEXOL 300 MG/ML  SOLN COMPARISON:  CT chest, abdomen, pelvis 10/19/2021. CT chest abdomen pelvis 09/21/2021 FINDINGS: CHEST: Ports and Devices: None. Lungs/airways: Diffuse peribronchovascular ground-glass and hypodense heterogeneous consolidative airspace opacities that are most prominent within the bilateral lower lobes. Limited evaluation for pulmonary nodule due to motion artifact and consolidation. No pulmonary mass. No pulmonary contusion or laceration. No pneumatocele formation. Debris within the trachea. Otherwise the central airways are patent. Pleura: No pleural effusion. No pneumothorax. No hemothorax. Lymph Nodes: Enlarged mediastinal lymph nodes: 1.3 cm aortopulmonary window. No hilar or axillary lymphadenopathy. Mediastinum: No pneumomediastinum. No aortic injury or mediastinal hematoma. The thoracic aorta is normal in caliber. At least moderate atherosclerotic plaque. Coronary artery calcifications. The heart is normal in size. No significant pericardial effusion. The main pulmonary artery is normal in caliber. No central pulmonary embolus. The esophagus is unremarkable. The thyroid is unremarkable. Chest Wall / Breasts: No chest wall mass. Musculoskeletal: Diffusely decreased bone density. No acute rib or sternal fracture; however, markedly limited evaluation due to respiratory motion artifact. Known subacute rib fractures are again noted and are poorly evaluation due to motion artifact. No acute spinal fracture. ABDOMEN / PELVIS: Liver: Not enlarged. No focal lesion. No laceration or subcapsular hematoma. Biliary System: The gallbladder is otherwise unremarkable with no radio-opaque gallstones. No biliary ductal dilatation. Pancreas: Atrophic. There is a 1.2 x 1 cm hypodense lesion within the  proximal pancreatic body (2:72). No main pancreatic duct dilatation. Spleen: Not enlarged. No focal lesion. No laceration, subcapsular hematoma, or vascular injury. Adrenal Glands: No nodularity bilaterally. Kidneys: Bilateral kidneys enhance symmetrically. No hydronephrosis. No contusion, laceration, or subcapsular hematoma. No injury to the vascular structures or collecting systems. No hydroureter. The urinary bladder is unremarkable. Bowel: No small or large bowel wall thickening or dilatation. Scattered colonic diverticulosis. The appendix is unremarkable. Mesentery, Omentum, and Peritoneum: Redemonstration of a grossly similar-appearing 3.6 x 3.6 x 5.2 Cm fluid density lesion within the left upper abdomen. No associated peripheral enhancement. No simple free fluid ascites. No pneumoperitoneum. No hemoperitoneum. No mesenteric hematoma identified. No organized fluid collection. Pelvic Organs: Normal. Lymph Nodes: No abdominal, pelvic, inguinal lymphadenopathy. Vasculature: The main portal, splenic, superior mesenteric veins are patent. Atherosclerotic plaque. No abdominal aorta or iliac aneurysm. No active contrast extravasation or pseudoaneurysm. Musculoskeletal: No significant soft tissue hematoma. Diffusely decreased bone density. No acute pelvic fracture. Chronic stable L4-L5 compression fractures. No acute spinal fracture. Redemonstration of subacute right L1-L2 transverse process fractures. Redemonstration of subacute S3 and S4 vertebral body fractures. IMPRESSION: 1. Pulmonary findings consistent with  infection/inflammation. Associated debris within the trachea. Followup PA and lateral chest X-ray is recommended in 3-4 weeks following therapy to ensure resolution and exclude underlying malignancy. 2. Mediastinal lymphadenopathy likely reactive in etiology. Attention on follow-up. 3. No acute traumatic injury to the chest, abdomen, or pelvis. 4. No acute fracture or traumatic malalignment of the thoracic  or lumbar spine.Redemonstration of subacute right L1-L2 transverse process as well as S3 and S4 fractures. These were acute on 10/19/2021. 5. Markedly limited evaluation of the ribs for acute fracture due to respiratory motion artifact. Known subacute rib fractures are again noted but poorly evaluated due to motion artifact. Other imaging findings of potential clinical significance: 1. Grossly similar-appearing 3.6 x 3.6 x 5.2cm fluid density lesion within the left upper abdomen. 2. Slightly more prominent due to administration of intravenous contrast, indeterminate 1.2 x 1 cm hypodense lesion within the proximal pancreatic body-consider nonemergent MRI pancreatic protocol. When the patient is clinically stable and able to follow directions and hold their breath (preferably as an outpatient) further evaluation with dedicated abdominal MRI should be considered. 3. Aortic Atherosclerosis (ICD10-I70.0) including coronary artery calcifications. 4. Diffusely decreased bone density. Electronically Signed   By: Iven Finn M.D.   On: 11/02/2021 16:46   DG Chest Portable 1 View  Result Date: 11/02/2021 CLINICAL DATA:  Pt diagnosed with pneumonia at [facility] on 11/01/2021, Cough, sob, congestion Neg covid 09/11/21 EXAM: PORTABLE CHEST - 1 VIEW COMPARISON:  10/19/2021 FINDINGS: New left perihilar airspace disease and some patchy left retrocardiac infiltrate. Right lung remains clear. Heart size and mediastinal contours are within normal limits. Aortic Atherosclerosis (ICD10-170.0). No effusion.  No pneumothorax. Visualized bones unremarkable. IMPRESSION: 1. New left perihilar airspace disease and patchy left retrocardiac opacities suggesting pneumonia. Electronically Signed   By: Lucrezia Europe M.D.   On: 11/02/2021 15:21    EKG: Independently reviewed.  Sinus tachycardia rate 103.  QTc 449.  Incomplete right bundle branch block.  No significant change from prior.  Assessment/Plan Principal Problem:   Severe sepsis  (HCC) Active Problems:   PNA (pneumonia)   Myasthenia gravis (HCC)   CLL (chronic lymphocytic leukemia) (HCC)   Diarrhea   Acute respiratory failure with hypoxia (HCC)   Acute lower UTI   Severe sepsis secondary to pneumonia, UTI-meeting criteria for severe sepsis with tachycardia- heart rate 91-129, tachypneic respiratory rate 15-32, leukocytosis 18.6 and Acute hypoxic respiratory failure.  Lactic acid 2.1 > 1.5, T-max 99.8. -2 L bolus given, continue L/R 100cc/hr x 20hrs  Pneumonia with acute hypoxic respiratory failure- O2 sats down to 87% on 6 L. Currently on nonrebreather., sats > 94%.  COVID and influenza negative.  CT chest-diffuse peribronchovascular groundglass and hypodense heterogeneous consolidative airspace opacities most prominent within the bilateral lower lobes.  Denies problems with swallowing.  CT also shows debris's within the trachea. -Continue with IV vancomycin and azithromycin, will add Unasyn ( Concern for possible aspiration). -Follow-up MRSA PCR -Failed bedside swallow evaluation -Obtain ABG -Expectorated sputum gram stain and culture if able to obtain  -Urine strep and Legionella -Respiratory protocol -DNR status, but he is okay with BiPAP if needed -Follow-up blood cultures - NPO - Speech therapy eval  Urinary tract infection- UA shows moderate leukocytes.  No recent urine cultures on file. -IV ceftriazone x 1 given, continue with IV Unasyn for now -Follow-up urine cultures  Diarrhea, fecal incontinence-no acute abdominal findings to explain diarrhea. - Stool C. difficile  CLL, IgM monoclonal gammopathy of undetermined significance-follows with Dr. Delton Coombes.  Lymphoma  deemed as low-grade, no B symptoms, no concern for recurrence.  Falls, vertebrae and rib fractures-falls requiring hospitalization at [facility] 12/5.  Head CT and chest CT today without acute abnormalities or fractures.  Redemonstrates L1-L2, S3-S4 fractures are  subacute.  Incidental CT findings- Grossly similar-appearing 3.6 x 3.6 x 5.2cm fluid density lesion within the left upper abdomen. 2. Slightly more prominent due to administration of intravenous contrast, indeterminate 1.2 x 1 cm hypodense lesion within the proximal pancreatic body-consider nonemergent MRI pancreatic protocol. When the patient is clinically stable and able to follow directions and hold their breath (preferably as an outpatient) further evaluation with dedicated abdominal MRI should be considered. -Problem list updated  Elevated liver enzymes-AST 113, ALT 79.  ALP normal at 121.  Likely related to severe sepsis.  CT abdomen - liver and biliary system without acute abnormality, no gallstones, no biliary ductal dilatation. - Trend  Myasthenia gravis -Pyridostigmine on med list, but does not appear patient is taking it  BPH - NPO, hold finasteride and tamsulosin  DVT prophylaxis: Heparin Code Status: DNR Family Communication: None at bedside Disposition Plan: . 2 days Consults called: None  admission status:  inpt, step down. I certify that at the point of admission it is my clinical judgment that the patient will require inpatient hospital care spanning beyond 2 midnights from the point of admission due to high intensity of service, high risk for further deterioration and high frequency of surveillance required.    Bethena Roys MD [facility]  11/02/2021, 6:25 PM

## 2021-11-02 NOTE — ED Notes (Signed)
Oxygen stas 87% on 6lnc, placed on 12 liters simple partial nrb.

## 2021-11-03 DIAGNOSIS — A419 Sepsis, unspecified organism: Secondary | ICD-10-CM

## 2021-11-03 DIAGNOSIS — J690 Pneumonitis due to inhalation of food and vomit: Secondary | ICD-10-CM

## 2021-11-03 DIAGNOSIS — E43 Unspecified severe protein-calorie malnutrition: Secondary | ICD-10-CM | POA: Insufficient documentation

## 2021-11-03 DIAGNOSIS — L8990 Pressure ulcer of unspecified site, unspecified stage: Secondary | ICD-10-CM | POA: Insufficient documentation

## 2021-11-03 LAB — URINE CULTURE

## 2021-11-03 LAB — MRSA NEXT GEN BY PCR, NASAL: MRSA by PCR Next Gen: NOT DETECTED

## 2021-11-03 LAB — CBC
HCT: 28.1 % — ABNORMAL LOW (ref 39.0–52.0)
Hemoglobin: 8.6 g/dL — ABNORMAL LOW (ref 13.0–17.0)
MCH: 28.7 pg (ref 26.0–34.0)
MCHC: 30.6 g/dL (ref 30.0–36.0)
MCV: 93.7 fL (ref 80.0–100.0)
Platelets: 215 10*3/uL (ref 150–400)
RBC: 3 MIL/uL — ABNORMAL LOW (ref 4.22–5.81)
RDW: 14.5 % (ref 11.5–15.5)
WBC: 12.9 10*3/uL — ABNORMAL HIGH (ref 4.0–10.5)
nRBC: 0 % (ref 0.0–0.2)

## 2021-11-03 LAB — COMPREHENSIVE METABOLIC PANEL
ALT: 65 U/L — ABNORMAL HIGH (ref 0–44)
AST: 78 U/L — ABNORMAL HIGH (ref 15–41)
Albumin: 2.2 g/dL — ABNORMAL LOW (ref 3.5–5.0)
Alkaline Phosphatase: 91 U/L (ref 38–126)
Anion gap: 11 (ref 5–15)
BUN: 67 mg/dL — ABNORMAL HIGH (ref 8–23)
CO2: 22 mmol/L (ref 22–32)
Calcium: 8.8 mg/dL — ABNORMAL LOW (ref 8.9–10.3)
Chloride: 110 mmol/L (ref 98–111)
Creatinine, Ser: 1.33 mg/dL — ABNORMAL HIGH (ref 0.61–1.24)
GFR, Estimated: 51 mL/min — ABNORMAL LOW (ref >60)
Glucose, Bld: 109 mg/dL — ABNORMAL HIGH (ref 70–99)
Potassium: 3.8 mmol/L (ref 3.5–5.1)
Sodium: 143 mmol/L (ref 135–145)
Total Bilirubin: 0.3 mg/dL (ref 0.3–1.2)
Total Protein: 5.4 g/dL — ABNORMAL LOW (ref 6.5–8.1)

## 2021-11-03 LAB — STREP PNEUMONIAE URINARY ANTIGEN: Strep Pneumo Urinary Antigen: POSITIVE — AB

## 2021-11-03 LAB — MAGNESIUM: Magnesium: 2.5 mg/dL — ABNORMAL HIGH (ref 1.7–2.4)

## 2021-11-03 LAB — CK: Total CK: 37 U/L — ABNORMAL LOW (ref 49–397)

## 2021-11-03 LAB — PROCALCITONIN: Procalcitonin: 5.79 ng/mL

## 2021-11-03 MED ORDER — LACTATED RINGERS IV SOLN: INTRAVENOUS | Status: DC

## 2021-11-03 MED ORDER — TAMSULOSIN HCL 0.4 MG PO CAPS
0.4000 mg | ORAL_CAPSULE | Freq: Every day | ORAL | Status: DC
  Administered 2021-11-03: 17:00:00 0.4 mg via ORAL
  Filled 2021-11-03: qty 1

## 2021-11-03 MED ORDER — PIPERACILLIN-TAZOBACTAM 3.375 G IVPB
3.3750 g | Freq: Three times a day (TID) | INTRAVENOUS | Status: DC
  Administered 2021-11-03 – 2021-11-04 (×4): 3.375 g via INTRAVENOUS
  Filled 2021-11-03 (×4): qty 50

## 2021-11-03 MED ORDER — PYRIDOSTIGMINE BROMIDE 60 MG PO TABS
30.0000 mg | ORAL_TABLET | Freq: Three times a day (TID) | ORAL | Status: DC
Start: 2021-11-03 — End: 2021-11-04
  Administered 2021-11-03 – 2021-11-04 (×4): 30 mg via ORAL
  Filled 2021-11-03 (×5): qty 1

## 2021-11-03 NOTE — TOC Initial Note (Addendum)
Transition of Care [facility]) - Initial/Assessment Note    Patient Details  Name: Paul Keith MRN: [id number] Date of Birth: 1930/06/14  Transition of Care [facility]) CM/SW Contact:    Shade Flood, LCSW Phone Number: 11/03/2021, 11:53 AM  Clinical Narrative:                  Pt admitted from rehab at [facility]. Per MD, family wanting new SNF at dc. Met with family at pt's bedside. Pt's daughter states that they would like referrals to local SNFs and if possible, also to SNFs in [place] [facility]).   Will refer out for SNF and TOC will follow.  1420: spoke with pt's daughter again at her request and she indicated that they prefer for pt to stay in this area so referrals to [place] not needed.  Expected Discharge Plan: Skilled Nursing Facility Barriers to Discharge: Continued Medical Work up   Patient Goals and CMS Choice Patient states their goals for this hospitalization and ongoing recovery are:: go to a different SNF CMS Medicare.gov Compare Post Acute Care list provided to:: Patient Represenative (must comment) Choice offered to / list presented to : Adult Children  Expected Discharge Plan and Services Expected Discharge Plan: [place] In-house Referral: Clinical Social Work   Post Acute Care Choice: [place] Living arrangements for the past 2 months: [place], [place]                                      Prior Living Arrangements/Services Living arrangements for the past 2 months: [place], [place] Lives with:: Self Patient language and need for interpreter reviewed:: Yes Do you feel safe going back to the place where you live?: Yes      Need for Family Participation in Patient Care: Yes (Comment) Care giver support system in place?: No (comment)   Criminal Activity/Legal Involvement Pertinent to Current Situation/Hospitalization: No - Comment as needed  Activities  of Daily Living      Permission Sought/Granted Permission sought to share information with : Facility Art therapist granted to share information with : Yes, Verbal Permission Granted     Permission granted to share info w AGENCY: snfs        Emotional Assessment       Orientation: : Oriented to Self, Oriented to Place Alcohol / Substance Use: Not Applicable Psych Involvement: No (comment)  Admission diagnosis:  Healthcare-associated pneumonia [J18.9] Elevated LFTs [R79.89] Severe sepsis ([place]) [A41.9, R65.20] Sepsis with acute hypoxic respiratory failure without septic shock, due to unspecified organism ([place]) [A41.9, R65.20, J96.01] Patient Active Problem List   Diagnosis Date Noted   Aspiration pneumonitis ([place]) 11/03/2021   Sepsis due to undetermined organism ([place]) 11/03/2021   Severe sepsis ([place]) 11/02/2021   Acute respiratory failure with hypoxia ([place]) 11/02/2021   Acute lower UTI 11/02/2021   Pancreatic lesion 11/02/2021   Stage 2 chronic kidney disease 10/20/2021   CLL (chronic lymphocytic leukemia) ([place]) 10/20/2021   Frequent falls 10/19/2021   Uremia 09/21/2021   Diarrhea 09/17/2021   Acute renal failure superimposed on stage 4 chronic kidney disease ([place]) 09/11/2021   HLD (hyperlipidemia) 09/11/2021   Myasthenia gravis ([place]) 09/11/2021   BPH (benign prostatic hyperplasia) 09/10/2021   Arthralgia of right knee 08/03/2021   PNA (pneumonia) 04/24/2018   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, [place] [address] Phone: [phone] Fax: [phone]     Social Determinants of Health (SDOH) Interventions    Readmission Risk Interventions Readmission Risk Prevention Plan 11/03/2021  Transportation Screening Complete  HRI or [place] Complete  Social Work Consult for [place] Planning/Counseling Complete  Palliative Care Screening Not Applicable  Medication Review Press photographer) Complete  Some recent data might be hidden

## 2021-11-03 NOTE — Progress Notes (Signed)
Initial Nutrition Assessment  DOCUMENTATION CODES:   Severe malnutrition in context of chronic illness  INTERVENTION:  Pending results of ST evaluation.  If pt unable to advance diet safely- strongly recommend consider placing NGT for provision of enteral nutrition given his malnourished state.   NUTRITION DIAGNOSIS:   Severe Malnutrition related to acute and chronic illness as evidenced by poor oral intake per family prior to admission, severe muscle depletion, severe fat depletion, 23% weight loss x 5 months,(BMI-16.68).   GOAL:  Patient will meet greater than or equal to 90% of their needs (if feasible given pt age, health care goals and chronic disease burden)   MONITOR:  Labs, Weight trends, Skin (ST evaluation results)  REASON FOR ASSESSMENT:   Malnutrition Screening Tool    ASSESSMENT: Patient is an underweight 87 yo male with hx of  CKD-3, lymphoplasmacytoid lymphoma, anemia, and HLD who presents from local SNF with weakness, decreased oral intake and pneumonia.   Daughter is bedside during RD visit. Patient usually likes whole milk with meals and likes ice cream. He doesn't care for Ensure type products. Patient has increased protein/energy requirements due to skin breakdown.   Patient is NPO since admission pending swallow evaluation.   Weight history reflects severe wt loss the past 2 months-09/12/21 wt 61.8 kg and currently 55.8 kg. He weighed 72.9 kg in July. A loss of 23% the past 5 months.   Medications reviewed.   Labs: BMP Latest Ref Rng & Units 11/03/2021 11/02/2021 09/12/2021  Glucose 70 - 99 mg/dL 109(H) 117(H) 91  BUN 8 - 23 mg/dL 67(H) 70(H) 60(H)  Creatinine 0.61 - 1.24 mg/dL 1.33(H) 1.66(H) 2.56(H)  Sodium 135 - 145 mmol/L 143 141 138  Potassium 3.5 - 5.1 mmol/L 3.8 4.3 4.4  Chloride 98 - 111 mmol/L 110 105 109  CO2 22 - 32 mmol/L 22 24 19(L)  Calcium 8.9 - 10.3 mg/dL 8.8(L) 9.4 8.9      NUTRITION - FOCUSED PHYSICAL EXAM:  Flowsheet Row  Most Recent Value  Orbital Region Moderate depletion  Upper Arm Region Severe depletion  Thoracic and Lumbar Region Severe depletion  Buccal Region Moderate depletion  Temple Region Moderate depletion  Clavicle Bone Region Severe depletion  Clavicle and Acromion Bone Region Severe depletion  Scapular Bone Region Severe depletion  Dorsal Hand Moderate depletion  Patellar Region Severe depletion  Anterior Thigh Region Severe depletion  Posterior Calf Region Severe depletion  Edema (RD Assessment) None  Hair Reviewed  [balding]  Eyes Unable to assess  Mouth Reviewed  Skin Reviewed  Nails Reviewed       Diet Order:   Diet Order             Diet NPO time specified  Diet effective now                   EDUCATION NEEDS:  Not appropriate for education at this time  Skin:  Skin Assessment: Skin Integrity Issues: Skin Integrity Issues:: Stage II Stage II: Patient has a stage 2 to right heel and sacrum  Last BM:  12/19  Height:   Ht Readings from Last 1 Encounters:  11/02/21 6' (1.829 m)    Weight:   Wt Readings from Last 1 Encounters:  11/03/21 55.8 kg    Ideal Body Weight:   81 kg  BMI:  Body mass index is 16.68 kg/m.  Estimated Nutritional Needs:   Kcal:  3810-1751  Protein:  94-100 gr  Fluid:  >1700 ml daily  Colman Cater MS,RD,CSG,LDN Contact: Shea Evans

## 2021-11-03 NOTE — Progress Notes (Addendum)
PROGRESS NOTE  Paul Keith XIP:[id number] DOB: [birth year] DOA: 11/02/2021 PCP: Wilburt Finlay, MD  Brief History:  87 year old male with a history of CKD stage III, NHL (lymphoblastic lymphoma) in remission, hyperlipidemia, myasthenia gravis, BPH, MGUS presenting from [facility] secondary to 3-day history of progressive generalized weakness, decreased oral intake, and pneumonia.  Notably, the patient was recently admitted to [facility] from 10/20/2019 to 10/22/2021 for altered mental status and frequent falls.  During his hospital stay there, the patient was confused and had hospital delirium.  He was found to have acute nondisplaced fractures of right L1 and L2 transverse processes as well as S3 and S4 vertebral bodies.  He had unchanged compression deformities of L4-L5.  He also has subacute appearing fractures of the right eighth  to 10th ribs and acute right 11th rib fracture.  During his hospital stay, he was noted to have an ammonia 57 and given lactulose.  His ammonia returned to normal.  At the time of his discharge to [facility], the patient was awake and alert and eating.  Prior to his admission to [facility] the patient was living alone, but his son and daughter were taking turns staying with him on 24-hour basis.  The patient had been fairly independent and doing well from a functional standpoint until he began having frequent falls beginning in September 2022.  Daughter did relate that he did experience some mild cognitive decline in the last 8 to 12 months.  He presents on 11/02/2021 from [facility] secondary to 3 to 4 days of increasing shortness of breath, coughing and chest congestion.  Chest x-ray showed pneumonia.  The patient apparently was started on some type of antibiotic.  He continued to have chest congestion and decreased oral intake and worsening generalized weakness.  As result, the patient was brought to the ER for further evaluation and treatment. In the ED, the  patient had low-grade temperature 100.3.  He was tachycardic.  He had soft blood pressures.  The patient had oxygen saturation in the mid 80s on room air.  He was placed on a nonrebreather initially.  WBC 18.6, hemoglobin 11.3, platelets 223,000.  CT chest showed diffuse GGO and hypodense consolidation opacities in the bilateral lower lobes with debris in the trachea.  CT of the abdomen and pelvis showed a chronic left upper abdomen fluid density collection.  CT of the brain showed mild chronic small vessel disease.  ABG pH 7.3 36/44/37/21 on 80%.  Assessment/Plan: Severe Sepsis -Present on admission -Secondary to pneumonia -Lactic acid peaked 2.1>>1.5 -Start IV fluids -Continue empiric IV antibiotics -Check PCT  Aspiration pneumonia -Broaden antibiotic coverage to Zosyn -Continue azithromycin -Personally reviewed chest x-ray--bilateral patchy opacities, left greater than right -Speech therapy evaluation -MRSA screen--neg -12/29 CT chest--difuse GGO and consolidative opacities bilateral LL; debris in trachea; subacute rib fx  Acute respiratory failure with hypoxia -due to pneumonia -presented with saturation 87% on RA with tachypnea -stable on 4L -wean as tolerated to RA for saturatino >92%  CKD stage IIIb -Baseline creatinine 1.3-1.6  Lumbar transverse process fractures and rib fractures -Judicious opioids -PT as tolerated  BPH -Continue tamsulosin  Myasthenia gravis -Continue Mestinon  Lymphoblastic lymphoma -Currently in remission -Last received rituximab 02/02/2017 -Outpatient oncology follow-up  MGUS -Outpatient oncology follow-up  Diarrhea -C. difficile negative  Transaminasemis -due to sepsis -CT abd--Neg liver, neg GB -trending down  Abdominal fluid collection -likely benign -present since 2017  Pancreatic fluid collection -outpatient  surveillance     Status is: Inpatient  Remains inpatient appropriate because: severity of illness requiring IV  abx and IVF        Family Communication:   daughter updated 12/20  Consultants:  none  Code Status:  DNR--confirmed with daughter  DVT Prophylaxis:  [place] Heparin    Procedures: As Listed in Progress Note Above  Antibiotics: Zosyn 12/20>> Azithro 12/29>>     Subjective: Patient is pleasantly confused.  Complains of pain in ribs.  No vomiting, resp distress, abd pain.  Remainder ROS unobtainable  Objective: Vitals:   11/03/21 0500 11/03/21 0600 11/03/21 0700 11/03/21 0800  BP: (!) 109/57 (!) 119/47 (!) 138/49 (!) 126/50  Pulse: (!) 105 (!) 103 (!) 110   Resp: (!) 24 (!) 23 (!) 24 17  Temp:      TempSrc:      SpO2: 95% 94% 92%   Weight:      Height:        Intake/Output Summary (Last 24 hours) at 11/03/2021 0816 Last data filed at 11/03/2021 0800 Gross per 24 hour  Intake 1256.45 ml  Output 665 ml  Net 591.45 ml   Weight change:  Exam:  General:  Pt is alert, does not follows command appropriately, not in acute distress HEENT: No icterus, No thrush, No neck mass,  [place]/AT Cardiovascular: RRR, S1/S2, no rubs, no gallops Respiratory: diminished BS.  Bilateral rales Abdomen: Soft/+BS, non tender, non distended, no guarding Extremities: No edema, No lymphangitis, No petechiae, No rashes, no synovitis   Data Reviewed: I have personally reviewed following labs and imaging studies Basic Metabolic Panel: Recent Labs  Lab 11/02/21 1325 11/03/21 0421  NA 141 143  K 4.3 3.8  CL 105 110  CO2 24 22  GLUCOSE 117* 109*  BUN 70* 67*  CREATININE 1.66* 1.33*  CALCIUM 9.4 8.8*   Liver Function Tests: Recent Labs  Lab 11/02/21 1325 11/03/21 0421  AST 113* 78*  ALT 79* 65*  ALKPHOS 121 91  BILITOT 1.1 0.3  PROT 6.9 5.4*  ALBUMIN 2.8* 2.2*   No results for input(s): LIPASE, AMYLASE in the last 168 hours. Recent Labs  Lab 11/02/21 1328  AMMONIA 23   Coagulation Profile: Recent Labs  Lab 11/02/21 1325  INR 1.3*   CBC: Recent Labs  Lab  11/02/21 1325 11/03/21 0421  WBC 18.6* 12.9*  NEUTROABS 16.9*  --   HGB 11.3* 8.6*  HCT 36.2* 28.1*  MCV 93.1 93.7  PLT 323 215   Cardiac Enzymes: No results for input(s): CKTOTAL, CKMB, CKMBINDEX, TROPONINI in the last 168 hours. BNP: Invalid input(s): POCBNP CBG: No results for input(s): GLUCAP in the last 168 hours. HbA1C: No results for input(s): HGBA1C in the last 72 hours. Urine analysis:    Component Value Date/Time   COLORURINE YELLOW 11/02/2021 1613   APPEARANCEUR HAZY (A) 11/02/2021 1613   LABSPEC 1.018 11/02/2021 1613   PHURINE 5.0 11/02/2021 1613   GLUCOSEU NEGATIVE 11/02/2021 1613   HGBUR SMALL (A) 11/02/2021 1613   BILIRUBINUR NEGATIVE 11/02/2021 1613   [place] 11/02/2021 1613   PROTEINUR 30 (A) 11/02/2021 1613   NITRITE NEGATIVE 11/02/2021 1613   LEUKOCYTESUR MODERATE (A) 11/02/2021 1613   Sepsis Labs: @LABRCNTIP (procalcitonin:4,lacticidven:4) ) Recent Results (from the past 240 hour(s))  Culture, blood (Routine x 2)     Status: None (Preliminary result)   Collection Time: 11/02/21  1:23 PM   Specimen: BLOOD RIGHT FOREARM  Result Value Ref Range Status   Specimen Description BLOOD  RIGHT FOREARM  Final   Special Requests   Final    BOTTLES DRAWN AEROBIC AND ANAEROBIC Blood Culture adequate volume Performed at [facility], [address] [place], [place] [address]    Culture PENDING  Incomplete   Report Status PENDING  Incomplete  Culture, blood (Routine x 2)     Status: None (Preliminary result)   Collection Time: 11/02/21  1:24 PM   Specimen: BLOOD LEFT HAND  Result Value Ref Range Status   Specimen Description BLOOD LEFT HAND  Final   Special Requests   Final    BOTTLES DRAWN AEROBIC AND ANAEROBIC Blood Culture results may not be optimal due to an inadequate volume of blood received in culture bottles Performed at [facility], [address] [place], [place] [address]    Culture PENDING  Incomplete   Report Status PENDING   Incomplete  Resp Panel by RT-PCR (Flu A&B, Covid) Nasopharyngeal Swab     Status: None   Collection Time: 11/02/21  1:25 PM   Specimen: Nasopharyngeal Swab; Nasopharyngeal(NP) swabs in vial transport medium  Result Value Ref Range Status   SARS Coronavirus 2 by RT PCR NEGATIVE NEGATIVE Final    Comment: (NOTE) SARS-CoV-2 target nucleic acids are NOT DETECTED.  The SARS-CoV-2 RNA is generally detectable in upper respiratory specimens during the acute phase of infection. The lowest concentration of SARS-CoV-2 viral copies this assay can detect is 138 copies/mL. A negative result does not preclude SARS-Cov-2 infection and should not be used as the sole basis for treatment or other patient management decisions. A negative result may occur with  improper specimen collection/handling, submission of specimen other than nasopharyngeal swab, presence of viral mutation(s) within the areas targeted by this assay, and inadequate number of viral copies(<138 copies/mL). A negative result must be combined with clinical observations, patient history, and epidemiological information. The expected result is Negative.  Fact Sheet for Patients:  EntrepreneurPulse.com.au  Fact Sheet for Healthcare Providers:  IncredibleEmployment.be  This test is no t yet approved or cleared by the [place] FDA and  has been authorized for detection and/or diagnosis of SARS-CoV-2 by FDA under an Emergency Use Authorization (EUA). This EUA will remain  in effect (meaning this test can be used) for the duration of the COVID-19 declaration under Section 564(b)(1) of the Act, 21 U.S.C.section 360bbb-3(b)(1), unless the authorization is terminated  or revoked sooner.       Influenza A by PCR NEGATIVE NEGATIVE Final   Influenza B by PCR NEGATIVE NEGATIVE Final    Comment: (NOTE) The Xpert Xpress SARS-CoV-2/FLU/RSV plus assay is intended as an aid in the diagnosis of influenza  from Nasopharyngeal swab specimens and should not be used as a sole basis for treatment. Nasal washings and aspirates are unacceptable for Xpert Xpress SARS-CoV-2/FLU/RSV testing.  Fact Sheet for Patients: EntrepreneurPulse.com.au  Fact Sheet for Healthcare Providers: IncredibleEmployment.be  This test is not yet approved or cleared by the [place] FDA and has been authorized for detection and/or diagnosis of SARS-CoV-2 by FDA under an Emergency Use Authorization (EUA). This EUA will remain in effect (meaning this test can be used) for the duration of the COVID-19 declaration under Section 564(b)(1) of the Act, 21 U.S.C. section 360bbb-3(b)(1), unless the authorization is terminated or revoked.  Performed at [facility], [address] [place], [place] [address]   C Difficile Quick Screen w PCR reflex     Status: None   Collection Time: 11/02/21  4:14 PM   Specimen: STOOL  Result Value Ref Range Status   C Diff antigen NEGATIVE NEGATIVE Final   C Diff toxin NEGATIVE NEGATIVE Final   C Diff interpretation No C. difficile detected.  Final    Comment: Performed at [facility], [address] [place],  [address]     Scheduled Meds:  Chlorhexidine Gluconate Cloth  6 each Topical Daily   heparin  5,000 Units Subcutaneous Q8H   Continuous Infusions:  ampicillin-sulbactam (UNASYN) IV Stopped (11/02/21 2118)   azithromycin Stopped (11/02/21 1421)   lactated ringers 100 mL/hr at 11/03/21 0600   [START ON 11/04/2021] vancomycin      Procedures/Studies: CT Head Wo Contrast  Result Date: 11/02/2021 CLINICAL DATA:  Sepsis.  Fall.  Head trauma. EXAM: CT HEAD WITHOUT CONTRAST TECHNIQUE: Contiguous axial images were obtained from the base of the skull through the vertex without intravenous contrast. COMPARISON:  10/19/2021 FINDINGS: Brain: Generalized brain atrophy. Mild chronic small-vessel ischemic change of the white matter. No sign of  acute infarction, mass lesion, hemorrhage, hydrocephalus or extra-axial collection. Vascular: There is atherosclerotic calcification of the major vessels at the base of the brain. Skull: Negative Sinuses/Orbits: Clear/normal Other: None IMPRESSION: No acute or traumatic finding. Age related atrophy. Mild chronic small-vessel ischemic change of the white matter. Electronically Signed   By: Nelson Chimes M.D.   On: 11/02/2021 16:26   CT CHEST ABDOMEN PELVIS W CONTRAST  Result Date: 11/02/2021 CLINICAL DATA:  Polytrauma, blunt fall, hypoxia, sepsis EXAM: CT CHEST, ABDOMEN, AND PELVIS WITH CONTRAST TECHNIQUE: Multidetector CT imaging of the chest, abdomen and pelvis was performed following the standard protocol during bolus administration of intravenous contrast. CONTRAST:  78mL OMNIPAQUE IOHEXOL 300 MG/ML  SOLN COMPARISON:  CT chest, abdomen, pelvis 10/19/2021. CT chest abdomen pelvis 09/21/2021 FINDINGS: CHEST: Ports and Devices: None. Lungs/airways: Diffuse peribronchovascular ground-glass and hypodense heterogeneous consolidative airspace opacities that are most prominent within the bilateral lower lobes. Limited evaluation for pulmonary nodule due to motion artifact and consolidation. No pulmonary mass. No pulmonary contusion or laceration. No pneumatocele formation. Debris within the trachea. Otherwise the central airways are patent. Pleura: No pleural effusion. No pneumothorax. No hemothorax. Lymph Nodes: Enlarged mediastinal lymph nodes: 1.3 cm aortopulmonary window. No hilar or axillary lymphadenopathy. Mediastinum: No pneumomediastinum. No aortic injury or mediastinal hematoma. The thoracic aorta is normal in caliber. At least moderate atherosclerotic plaque. Coronary artery calcifications. The heart is normal in size. No significant pericardial effusion. The main pulmonary artery is normal in caliber. No central pulmonary embolus. The esophagus is unremarkable. The thyroid is unremarkable. Chest Wall /  Breasts: No chest wall mass. Musculoskeletal: Diffusely decreased bone density. No acute rib or sternal fracture; however, markedly limited evaluation due to respiratory motion artifact. Known subacute rib fractures are again noted and are poorly evaluation due to motion artifact. No acute spinal fracture. ABDOMEN / PELVIS: Liver: Not enlarged. No focal lesion. No laceration or subcapsular hematoma. Biliary System: The gallbladder is otherwise unremarkable with no radio-opaque gallstones. No biliary ductal dilatation. Pancreas: Atrophic. There is a 1.2 x 1 cm hypodense lesion within the proximal pancreatic body (2:72). No main pancreatic duct dilatation. Spleen: Not enlarged. No focal lesion. No laceration, subcapsular hematoma, or vascular injury. Adrenal Glands: No nodularity bilaterally. Kidneys: Bilateral kidneys enhance symmetrically. No hydronephrosis. No contusion, laceration, or subcapsular hematoma. No injury to the vascular structures or collecting systems. No hydroureter. The urinary bladder is unremarkable. Bowel: No small or large bowel wall thickening or dilatation. Scattered colonic diverticulosis. The appendix is unremarkable. Mesentery,  Omentum, and Peritoneum: Redemonstration of a grossly similar-appearing 3.6 x 3.6 x 5.2 Cm fluid density lesion within the left upper abdomen. No associated peripheral enhancement. No simple free fluid ascites. No pneumoperitoneum. No hemoperitoneum. No mesenteric hematoma identified. No organized fluid collection. Pelvic Organs: Normal. Lymph Nodes: No abdominal, pelvic, inguinal lymphadenopathy. Vasculature: The main portal, splenic, superior mesenteric veins are patent. Atherosclerotic plaque. No abdominal aorta or iliac aneurysm. No active contrast extravasation or pseudoaneurysm. Musculoskeletal: No significant soft tissue hematoma. Diffusely decreased bone density. No acute pelvic fracture. Chronic stable L4-L5 compression fractures. No acute spinal fracture.  Redemonstration of subacute right L1-L2 transverse process fractures. Redemonstration of subacute S3 and S4 vertebral body fractures. IMPRESSION: 1. Pulmonary findings consistent with infection/inflammation. Associated debris within the trachea. Followup PA and lateral chest X-ray is recommended in 3-4 weeks following therapy to ensure resolution and exclude underlying malignancy. 2. Mediastinal lymphadenopathy likely reactive in etiology. Attention on follow-up. 3. No acute traumatic injury to the chest, abdomen, or pelvis. 4. No acute fracture or traumatic malalignment of the thoracic or lumbar spine.Redemonstration of subacute right L1-L2 transverse process as well as S3 and S4 fractures. These were acute on 10/19/2021. 5. Markedly limited evaluation of the ribs for acute fracture due to respiratory motion artifact. Known subacute rib fractures are again noted but poorly evaluated due to motion artifact. Other imaging findings of potential clinical significance: 1. Grossly similar-appearing 3.6 x 3.6 x 5.2cm fluid density lesion within the left upper abdomen. 2. Slightly more prominent due to administration of intravenous contrast, indeterminate 1.2 x 1 cm hypodense lesion within the proximal pancreatic body-consider nonemergent MRI pancreatic protocol. When the patient is clinically stable and able to follow directions and hold their breath (preferably as an outpatient) further evaluation with dedicated abdominal MRI should be considered. 3. Aortic Atherosclerosis (ICD10-I70.0) including coronary artery calcifications. 4. Diffusely decreased bone density. Electronically Signed   By: Iven Finn M.D.   On: 11/02/2021 16:46   DG Chest Portable 1 View  Result Date: 11/02/2021 CLINICAL DATA:  Pt diagnosed with pneumonia at [facility] on 11/01/2021, Cough, sob, congestion Neg covid 09/11/21 EXAM: PORTABLE CHEST - 1 VIEW COMPARISON:  10/19/2021 FINDINGS: New left perihilar airspace disease and some patchy left  retrocardiac infiltrate. Right lung remains clear. Heart size and mediastinal contours are within normal limits. Aortic Atherosclerosis (ICD10-170.0). No effusion.  No pneumothorax. Visualized bones unremarkable. IMPRESSION: 1. New left perihilar airspace disease and patchy left retrocardiac opacities suggesting pneumonia. Electronically Signed   By: Lucrezia Europe M.D.   On: 11/02/2021 15:21    Orson Eva, DO  [facility]  If 7PM-7AM, please contact night-coverage [email or web address] Password [email or web address] 11/03/2021, 8:16 AM   LOS: 1 day

## 2021-11-03 NOTE — [place] FL2 (Signed)
[place] MEDICAID FL2 LEVEL OF CARE SCREENING TOOL     IDENTIFICATION  Patient Name: Paul Keith Birthdate: [birth year] Sex: male Admission Date (Current Location): 11/02/2021  [facility] and [place] Number:  [employer] and Address:  [facility] [address] [place]      Provider Number: [id number]  Attending Physician Name and Address:  Orson Eva, MD  Relative Name and Phone Number:       Current Level of Care: Hospital Recommended Level of Care: [place] Prior Approval Number:    Date Approved/Denied:   PASRR Number: [id number]  Discharge Plan: SNF    Current Diagnoses: Patient Active Problem List   Diagnosis Date Noted   Aspiration pneumonitis ([place]) 11/03/2021   Sepsis due to undetermined organism ([place]) 11/03/2021   Severe sepsis ([place]) 11/02/2021   Acute respiratory failure with hypoxia ([place]) 11/02/2021   Acute lower UTI 11/02/2021   Pancreatic lesion 11/02/2021   Stage 2 chronic kidney disease 10/20/2021   CLL (chronic lymphocytic leukemia) ([place]) 10/20/2021   Frequent falls 10/19/2021   Uremia 09/21/2021   Diarrhea 09/17/2021   Acute renal failure superimposed on stage 4 chronic kidney disease ([place]) 09/11/2021   HLD (hyperlipidemia) 09/11/2021   Myasthenia gravis ([place]) 09/11/2021   BPH (benign prostatic hyperplasia) 09/10/2021   Arthralgia of right knee 08/03/2021   PNA (pneumonia) 04/24/2018   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   Lymphoma ([place]) 02/20/2018   Neutropenic fever ([place]) 11/25/2016   At high risk for infection due to neutropenia 07/23/2016   Waldenstrom macroglobulinemia ([place]) 05/21/2016   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[place]) 05/21/2016   IgM monoclonal gammopathy of uncertain significance 05/07/2016   Lymphoproliferative disease ([place]) 05/07/2016   Anemia 04/22/2016   Pancytopenia ([place]) 04/22/2016    Orientation RESPIRATION BLADDER Height & Weight     Self  O2 (see dc summary)  Incontinent Weight: 123 lb 0.3 oz (55.8 kg) Height:  6' (182.9 cm)  BEHAVIORAL SYMPTOMS/MOOD NEUROLOGICAL BOWEL NUTRITION STATUS      Continent Diet (see dc summary)  AMBULATORY STATUS COMMUNICATION OF NEEDS Skin   Extensive Assist Verbally PU Stage and Appropriate Care (R heel, Medial Sacrum)   PU Stage 2 Dressing: Daily                   Personal Care Assistance Level of Assistance  Bathing, Feeding, Dressing Bathing Assistance: Limited assistance Feeding assistance: Independent Dressing Assistance: Limited assistance     Functional Limitations Info  Sight, Hearing, Speech Sight Info: Adequate Hearing Info: Adequate Speech Info: Adequate    SPECIAL CARE FACTORS FREQUENCY  PT (By licensed PT), OT (By licensed OT)     PT Frequency: 5x week OT Frequency: 3x week            Contractures Contractures Info: Not present    Additional Factors Info  Code Status, Allergies Code Status Info: DNR Allergies Info: NKA           Current Medications (11/03/2021):  This is the current hospital active medication list Current Facility-Administered Medications  Medication Dose Route Frequency Provider Last Rate Last Admin   acetaminophen (TYLENOL) tablet 650 mg  650 mg Oral Q6H PRN Emokpae, Ejiroghene E, MD       Or   acetaminophen (TYLENOL) suppository 650 mg  650 mg Rectal Q6H PRN Emokpae, Ejiroghene E, MD       azithromycin (ZITHROMAX) 500 mg in sodium chloride 0.9 % 250 mL IVPB  500 mg Intravenous Q24H Emokpae,  Ejiroghene E, MD   Stopped at 11/02/21 1421   Chlorhexidine Gluconate Cloth 2 % PADS 6 each  6 each Topical Daily Emokpae, Ejiroghene E, MD   6 each at 11/02/21 2048   heparin injection 5,000 Units  5,000 Units Subcutaneous Q8H Emokpae, Ejiroghene E, MD   5,000 Units at 11/03/21 0548   lactated ringers infusion   Intravenous Continuous Tat, David, MD 75 mL/hr at 11/03/21 1026 New Bag at 11/03/21 1026   ondansetron (ZOFRAN) tablet 4 mg  4 mg Oral Q6H PRN Emokpae,  Ejiroghene E, MD       Or   ondansetron (ZOFRAN) injection 4 mg  4 mg Intravenous Q6H PRN Emokpae, Ejiroghene E, MD       piperacillin-tazobactam (ZOSYN) IVPB 3.375 g  3.375 g Intravenous Franco Collet, MD 12.5 mL/hr at 11/03/21 1033 3.375 g at 11/03/21 1033   pyridostigmine (MESTINON) tablet 30 mg  30 mg Oral Q8H Tat, Shanon Brow, MD       tamsulosin (FLOMAX) capsule 0.4 mg  0.4 mg Oral QPC supper Tat, Shanon Brow, MD         Discharge Medications: Please see discharge summary for a list of discharge medications.  Relevant Imaging Results:  Relevant Lab Results:   Additional Information SSN: [place], [place]

## 2021-11-03 NOTE — Evaluation (Signed)
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  Notably, the patient was recently admitted to [facility] from 10/20/2019 to 10/22/2021 for altered mental status and frequent falls.  During his hospital stay there, the patient was confused and had hospital delirium.  He was found to have acute nondisplaced fractures of right L1 and L2 transverse processes as well as S3 and S4 vertebral bodies.  He had unchanged compression deformities of L4-L5.  He also has subacute appearing fractures of the right eighth  to 10th ribs and acute right 11th rib fracture.  During his hospital stay, he was noted to have an ammonia 57 and given lactulose.  His ammonia returned to normal.  At the time of his discharge to [facility], the patient was awake and alert and eating. He presents on 11/02/2021 from [facility] secondary to 3 to 4 days of increasing shortness of breath, coughing and chest congestion.  Chest x-ray showed pneumonia. CT chest showed diffuse GGO and hypodense consolidation opacities in  the bilateral lower lobes with debris in the trachea.  CT of the abdomen and pelvis showed a chronic left upper abdomen fluid density collection.  CT of the brain showed mild chronic small vessel disease. BSE requested.    Assessment / Plan / Recommendation  Clinical Impression  Clinical swallow evaluation completed at bedside. Pt alert, but confused, appears cachectic. Oral motor examination reveals adequate dentition and mild xerostomia. Pt without gross facial asymmetry. Pt assessed with ice chips, water via spoon/cup/straw, puree, and regular textures. No overt signs of reduced airway protection, however Pt with impaired mastication and bolus manipulation with solids resulting in impaired anterior posterior transit and oral residuals which were removed. Recommend D1/puree and thin liquids with aspiration and reflux precautions, po medications whole in puree or crushed as able, full supervision with meals. SLP will follow during acute stay. Above to RN. SLP Visit Diagnosis: Dysphagia, unspecified (R13.10)    Aspiration Risk  Mild aspiration risk;Risk for inadequate nutrition/hydration    Diet Recommendation Dysphagia 1 (Puree);Thin liquid   Liquid Administration via: Straw;Cup Medication Administration: Whole meds with puree Supervision: Staff to assist with self feeding;Full supervision/cueing for compensatory strategies Compensations: Slow rate Postural Changes: Seated upright at 90 degrees;Remain upright for at least 30 minutes after po intake    Other  Recommendations Oral Care Recommendations: Oral care BID;Staff/trained caregiver to provide oral care Other Recommendations: Clarify dietary restrictions    Recommendations for follow up therapy are one component of a multi-disciplinary discharge planning process, led by the attending physician.  Recommendations may be updated based on patient status, additional functional criteria and insurance authorization.  Follow up Recommendations  Skilled nursing-short term rehab (<3 hours/day)      Assistance Recommended at Discharge Frequent or constant Supervision/Assistance  Functional Status Assessment Patient has had a recent decline in their functional status and demonstrates the ability to make significant improvements in function in a reasonable and predictable amount of time.  Frequency and Duration min 2x/week  1 week       Prognosis Prognosis for Safe Diet Advancement: Fair Barriers to Reach Goals: Cognitive deficits;Motivation      Swallow Study   General Date of Onset: 11/02/21 HPI: 87 year old male with a history of CKD stage III, NHL (lymphoblastic lymphoma) in remission, hyperlipidemia, myasthenia gravis, BPH, MGUS presenting from [facility] secondary to 3-day history of progressive generalized weakness, decreased oral intake, and pneumonia.  Notably, the patient was recently admitted to [facility] from 10/20/2019 to 10/22/2021 for altered mental status and frequent falls.  During his hospital stay there, the patient was confused and had hospital delirium.  He was found to have acute nondisplaced fractures of right L1 and L2 transverse processes as well as S3 and S4 vertebral bodies.  He had unchanged compression deformities of L4-L5.  He also has subacute appearing fractures of the right eighth  to 10th ribs and acute right 11th rib fracture.  During his hospital stay, he was noted to have an ammonia 57 and given lactulose.  His ammonia returned to normal.  At the time of his discharge to [facility], the patient was awake and alert and eating. He presents on 11/02/2021 from [facility] secondary to 3 to 4 days of increasing shortness of breath, coughing and chest congestion.  Chest x-ray showed pneumonia. CT chest showed diffuse GGO and hypodense consolidation opacities in the bilateral lower lobes with debris in the trachea.  CT of the abdomen and pelvis showed a chronic left upper abdomen fluid density collection.  CT of the  brain showed mild chronic small vessel disease. BSE requested. Type of Study: Bedside Swallow Evaluation Diet Prior to this Study: NPO Temperature Spikes Noted: No Respiratory Status: Nasal cannula History of Recent Intubation: No Behavior/Cognition: Alert;Cooperative;Requires cueing;Confused Oral Cavity Assessment: Within Functional Limits Oral Care Completed by SLP: Yes Oral Cavity - Dentition: Adequate natural dentition Vision: Functional for self-feeding Self-Feeding Abilities: Needs assist;Needs set up Patient Positioning: Upright in bed Baseline Vocal Quality: Normal;Low vocal intensity Volitional Cough: Weak Volitional Swallow: Able to elicit    Oral/Motor/Sensory Function Overall Oral Motor/Sensory Function: Within functional limits   Ice Chips Ice chips: Within functional limits Presentation: Spoon   Thin Liquid Thin Liquid: Within functional limits Presentation: Cup;Spoon;Straw    Nectar Thick Nectar Thick Liquid: Not tested   Honey Thick Honey Thick Liquid: Not tested   Puree Puree: Within functional limits Presentation: Spoon   Solid     Solid: Impaired Oral Phase Impairments: Reduced lingual movement/coordination;Impaired mastication Oral Phase Functional Implications: Prolonged oral transit;Impaired mastication;Oral residue     Thank you,  Genene Churn, [place]  Jalee Saine 11/03/2021,4:42 PM

## 2021-11-04 DIAGNOSIS — C9110 Chronic lymphocytic leukemia of B-cell type not having achieved remission: Secondary | ICD-10-CM

## 2021-11-04 DIAGNOSIS — A419 Sepsis, unspecified organism: Principal | ICD-10-CM

## 2021-11-04 DIAGNOSIS — K869 Disease of pancreas, unspecified: Secondary | ICD-10-CM

## 2021-11-04 DIAGNOSIS — J690 Pneumonitis due to inhalation of food and vomit: Secondary | ICD-10-CM

## 2021-11-04 DIAGNOSIS — J9601 Acute respiratory failure with hypoxia: Secondary | ICD-10-CM

## 2021-11-04 DIAGNOSIS — G7000 Myasthenia gravis without (acute) exacerbation: Secondary | ICD-10-CM

## 2021-11-04 DIAGNOSIS — E43 Unspecified severe protein-calorie malnutrition: Secondary | ICD-10-CM

## 2021-11-04 DIAGNOSIS — Z7189 Other specified counseling: Secondary | ICD-10-CM

## 2021-11-04 DIAGNOSIS — N390 Urinary tract infection, site not specified: Secondary | ICD-10-CM

## 2021-11-04 DIAGNOSIS — R6520 Severe sepsis without septic shock: Secondary | ICD-10-CM

## 2021-11-04 LAB — CBC
HCT: 28.2 % — ABNORMAL LOW (ref 39.0–52.0)
Hemoglobin: 8.5 g/dL — ABNORMAL LOW (ref 13.0–17.0)
MCH: 28.6 pg (ref 26.0–34.0)
MCHC: 30.1 g/dL (ref 30.0–36.0)
MCV: 94.9 fL (ref 80.0–100.0)
Platelets: 218 10*3/uL (ref 150–400)
RBC: 2.97 MIL/uL — ABNORMAL LOW (ref 4.22–5.81)
RDW: 14.4 % (ref 11.5–15.5)
WBC: 6.9 10*3/uL (ref 4.0–10.5)
nRBC: 0 % (ref 0.0–0.2)

## 2021-11-04 LAB — GLUCOSE, CAPILLARY: Glucose-Capillary: 81 mg/dL (ref 70–99)

## 2021-11-04 LAB — LEGIONELLA PNEUMOPHILA SEROGP 1 UR AG: L. pneumophila Serogp 1 Ur Ag: NEGATIVE

## 2021-11-04 LAB — COMPREHENSIVE METABOLIC PANEL
ALT: 81 U/L — ABNORMAL HIGH (ref 0–44)
AST: 107 U/L — ABNORMAL HIGH (ref 15–41)
Albumin: 2 g/dL — ABNORMAL LOW (ref 3.5–5.0)
Alkaline Phosphatase: 92 U/L (ref 38–126)
Anion gap: 9 (ref 5–15)
BUN: 71 mg/dL — ABNORMAL HIGH (ref 8–23)
CO2: 23 mmol/L (ref 22–32)
Calcium: 8.7 mg/dL — ABNORMAL LOW (ref 8.9–10.3)
Chloride: 113 mmol/L — ABNORMAL HIGH (ref 98–111)
Creatinine, Ser: 1.4 mg/dL — ABNORMAL HIGH (ref 0.61–1.24)
GFR, Estimated: 48 mL/min — ABNORMAL LOW (ref >60)
Glucose, Bld: 98 mg/dL (ref 70–99)
Potassium: 3.2 mmol/L — ABNORMAL LOW (ref 3.5–5.1)
Sodium: 145 mmol/L (ref 135–145)
Total Bilirubin: 0.8 mg/dL (ref 0.3–1.2)
Total Protein: 4.8 g/dL — ABNORMAL LOW (ref 6.5–8.1)

## 2021-11-04 LAB — MAGNESIUM: Magnesium: 2.6 mg/dL — ABNORMAL HIGH (ref 1.7–2.4)

## 2021-11-04 MED ORDER — HYDROMORPHONE HCL 1 MG/ML IJ SOLN
0.5000 mg | INTRAMUSCULAR | Status: DC | PRN
  Administered 2021-11-04 – 2021-11-05 (×3): 0.5 mg via INTRAVENOUS
  Filled 2021-11-04 (×3): qty 0.5

## 2021-11-04 MED ORDER — LORAZEPAM 2 MG/ML IJ SOLN
2.0000 mg | INTRAMUSCULAR | Status: DC | PRN
  Administered 2021-11-05 (×2): 2 mg via INTRAVENOUS
  Filled 2021-11-04 (×2): qty 1

## 2021-11-04 MED ORDER — POTASSIUM CHLORIDE 10 MEQ/100ML IV SOLN
10.0000 meq | INTRAVENOUS | Status: AC
  Administered 2021-11-04 (×4): 10 meq via INTRAVENOUS
  Filled 2021-11-04 (×4): qty 100

## 2021-11-04 NOTE — Progress Notes (Signed)
1502 Patient's daughter Al Corpus) reported that she had spoken with her brother & they both agreed to have the patient placed on comfort care at this time

## 2021-11-04 NOTE — Progress Notes (Signed)
Speech Language Pathology Treatment: Dysphagia  Patient Details Name: Paul Keith MRN: [id number] DOB: 1930-11-10 Today's Date: 11/04/2021 Time: 4801-6553 SLP Time Calculation (min) (ACUTE ONLY): 29 min  Assessment / Plan / Recommendation Clinical Impression  Pt seen for ongoing dysphagia intervention following BSE completed yesterday. Pt's daughter was present for the visit and reports that Pt has lost a significant amount of weight since May. She also indicates that he had Shingles in October. He was only at [facility] for ~12 days and was living at home prior to that. He would eat cereal in the AM, a sandwich for lunch, and go out to dinner with his neighbor. Pt is more alert today, but still confused and requires gentle coaxing for po intake and to complete oral care. Pt reports mouth discomfort and exhibits facial grimace at times with oral care. He does not appear to have thrush, but he does have some dental decay and appears more sensitive on his upper right side. Pt consumed self presented cup and straw sips of thin water without overt signs or symptoms of aspiration, but does have some belching and "gurgling". Pt only agreeable to one bite of strawberry ice cream and declined his entire lunch tray. SLP explained reasons for puree diet to daughter (limited rotary mastication, prolonged oral transit, and oral residuals) and she appeared to appreciate. Pt helped with oral care by brushing his teeth. Recommend continue D1/puree and thin liquids and SLP to follow.     HPI HPI: 87 year old male with a history of CKD stage III, NHL (lymphoblastic lymphoma) in remission, hyperlipidemia, myasthenia gravis, BPH, MGUS presenting from [facility] secondary to 3-day history of progressive generalized weakness, decreased oral intake, and pneumonia.  Notably, the patient was recently admitted to [facility] from 10/20/2019 to 10/22/2021 for altered mental status and frequent falls.  During his hospital stay  there, the patient was confused and had hospital delirium.  He was found to have acute nondisplaced fractures of right L1 and L2 transverse processes as well as S3 and S4 vertebral bodies.  He had unchanged compression deformities of L4-L5.  He also has subacute appearing fractures of the right eighth  to 10th ribs and acute right 11th rib fracture.  During his hospital stay, he was noted to have an ammonia 57 and given lactulose.  His ammonia returned to normal.  At the time of his discharge to [facility], the patient was awake and alert and eating. He presents on 11/02/2021 from [facility] secondary to 3 to 4 days of increasing shortness of breath, coughing and chest congestion.  Chest x-ray showed pneumonia. CT chest showed diffuse GGO and hypodense consolidation opacities in the bilateral lower lobes with debris in the trachea.  CT of the abdomen and pelvis showed a chronic left upper abdomen fluid density collection.  CT of the brain showed mild chronic small vessel disease. BSE requested.      SLP Plan  Continue with current plan of care      Recommendations for follow up therapy are one component of a multi-disciplinary discharge planning process, led by the attending physician.  Recommendations may be updated based on patient status, additional functional criteria and insurance authorization.    Recommendations  Diet recommendations: Dysphagia 1 (puree);Thin liquid Liquids provided via: Cup;Straw Medication Administration: Whole meds with puree Supervision: Patient able to self feed;Intermittent supervision to cue for compensatory strategies Compensations: Slow rate Postural Changes and/or Swallow Maneuvers: Seated upright 90 degrees;Upright 30-60 min after meal  Oral Care Recommendations: Oral care BID;Staff/trained caregiver to provide oral care Follow Up Recommendations: Skilled nursing-short term rehab (<3 hours/day) Assistance recommended at discharge:  Frequent or constant Supervision/Assistance SLP Visit Diagnosis: Dysphagia, unspecified (R13.10) Plan: Continue with current plan of care          Thank you,  Genene Churn, [place]  [place]  11/04/2021, 1:02 PM

## 2021-11-04 NOTE — TOC Progression Note (Signed)
Transition of Care [facility]) - Progression Note    Patient Details  Name: Paul Keith MRN: [id number] Date of Birth: 30-May-1930  Transition of Care [facility]) CM/SW Contact  Salome Arnt, [place] Phone Number: 11/04/2021, 10:33 AM  Clinical Narrative:  LCSW presented bed offers and pt's daughter chooses [place] notified and per Mardene Celeste will start authorization today.      Expected Discharge Plan: [place] Barriers to Discharge: Continued Medical Work up  Expected Discharge Plan and Services Expected Discharge Plan: [place] In-house Referral: Clinical Social Work   Post Acute Care Choice: [place] Living arrangements for the past 2 months: [place], [place]                                       Social Determinants of Health (SDOH) Interventions    Readmission Risk Interventions Readmission Risk Prevention Plan 11/03/2021  Transportation Screening Complete  HRI or Home Care Consult Complete  Social Work Consult for [place] Planning/Counseling Complete  Palliative Care Screening Not Applicable  Medication Review Press photographer) Complete  Some recent data might be hidden

## 2021-11-04 NOTE — Consult Note (Signed)
Consultation Note Date: 11/04/2021   Patient Name: Paul Keith  DOB: 1930/01/09  MRN: [id number]  Age / Sex: 87 y.o., male  PCP: Wilburt Finlay, MD Referring Physician: Murlean Iba, MD  Reason for Consultation:  Goals of care, family asking about feeding tube placement d/t poor oral intake  HPI/Patient Profile: 87 y.o. male  with past medical history of CLL (s/p chemotx in remission), myasthenia gravis, frequent falls with trauma resulting in multiple lumbar and rib fractures, admitted on 11/02/2021 with sepsis due to pneumonia as well as new finding of pancreatic lesion on CT. He was hypoxic- now on 4L [place].Palliative consulted for [place] and family asking about feeding tube options.      Primary Decision Maker NEXT OF KIN- daughter and son  Discussion: Chart reviewed including labs and imaging.  Patient confused and unable to participate in goals of care discussion.  Met at the bedside with patient's daughter, Al Corpus.  She and her brother are patient's primary caretakers and decision makers. Patient is widowed, his spouse died about a year and a half ago.  Since then he initially was doing well living on his own however starting approximately him last July he began to have a quick and drastic decline in his functional, cognitive, and nutritional status. He has been having frequent falls and Joni notes at this point his legs just do not work.  He has had no appetite and has lost a great amount of weight.  His BMI is down to 17.04.  His cognitive status has changed significantly with confusion not recognizing family at times, often sleeping more than he is awake. June he notes he has had a very poor quality of life for the last several months and states "I wish he would just go".  We discussed that given the changes in the 3 future domains of function, that Layla is likely at end-of-life.  Noted current  interventions of ICU care IV fluids antibiotics increased fluids of oxygen may provide temporary prolongation of his life, however it will not improve his overall status and he would likely be left in a worse functional state than he was prior to this acute insult.  Additionally, Joni notes that per attending- lesion on pancreas is likely contributing to his decline and he is not a candidate for interventions.  Options for continued aggressive medical care, rehospitalization were discussed.  Options of transitioning to full comfort measures only and supporting patient through end-of-life were also discussed. Joni is clear that she would want to transition to full comfort measures only and support patient through end-of-life process rather than prolonging his dying.  She shared that if feeding tube, ICU care, IV fluids, antibiotics, labs and such were going to restore him to his prior independent state that would be the goal- she understands these interventions are unlikely to achieve that goal.  However she does need to discuss with her brother before transition and care plan as needed.  Palliative team contact information was given and Joni will  contact after she speaks with her brother.   SUMMARY OF RECOMMENDATIONS -Daughter to discuss transition to comfort measures only with her brother and will call PMT with final decision -Will evaluate his status after transition to comfort is made and determine stability for transfer to a hospice facility    Code Status/Advance Care Planning: DNR   Prognosis:   < 2 weeks  Discharge Planning: To Be Determined  Primary Diagnoses: Present on Admission:  Severe sepsis ([place])  PNA (pneumonia)  Acute respiratory failure with hypoxia (HCC)  CLL (chronic lymphocytic leukemia) (HCC)  Diarrhea  Myasthenia gravis ([place])  Pancreatic lesion   Review of Systems  Unable to perform ROS: Mental status change   Physical Exam Vitals and nursing note reviewed.   Constitutional:      Appearance: He is ill-appearing.     Comments: Frail, cachectic  Cardiovascular:     Rate and Rhythm: Tachycardia present.  Pulmonary:     Effort: Pulmonary effort is normal.  Neurological:     Comments: lethargic    Vital Signs: BP 105/70    Pulse (!) 153    Temp (!) 96.9 F (36.1 C) (Axillary)    Resp 17    Ht 6' (1.829 m)    Wt 57 kg    SpO2 91%    BMI 17.04 kg/m  Pain Scale: 0-10   Pain Score: 0-No pain   SpO2: SpO2: 91 % O2 Device:SpO2: 91 % O2 Flow Rate: .O2 Flow Rate (L/min): 4 L/min  IO: Intake/output summary:  Intake/Output Summary (Last 24 hours) at 11/04/2021 1415 Last data filed at 11/04/2021 0600 Gross per 24 hour  Intake 1427.44 ml  Output 1050 ml  Net 377.44 ml    LBM: Last BM Date: 11/02/21 Baseline Weight: Weight: 55.2 kg Most recent weight: Weight: 57 kg     Palliative Assessment/Data: PPS: 10%       Thank you for this consult. Palliative medicine will continue to follow and assist as needed.   Time In: 1315 Time Out: 1434 Time Total: 75 mins Greater than 50%  of this time was spent counseling and coordinating care related to the above assessment and plan.  Signed by: Mariana Kaufman, AGNP-C Palliative Medicine    Please contact Palliative Medicine Team phone at [phone] for questions and concerns.  For individual provider: See Shea Evans

## 2021-11-04 NOTE — Progress Notes (Signed)
[place] Paged Mariana Kaufman, NP regarding comfort care orders

## 2021-11-04 NOTE — Progress Notes (Signed)
PROGRESS NOTE  Paul Keith GEX:[id number] DOB: Jan 25, 1930 DOA: 11/02/2021 PCP: Wilburt Finlay, MD  Brief Presentation History:  87 year old male with a history of CKD stage III, NHL (lymphoblastic lymphoma) in remission, hyperlipidemia, myasthenia gravis, BPH, MGUS presenting from [facility] secondary to 3-day history of progressive generalized weakness, decreased oral intake, and pneumonia.  Notably, the patient was recently admitted to [facility] from 10/20/2019 to 10/22/2021 for altered mental status and frequent falls.  During his hospital stay there, the patient was confused and had hospital delirium.  He was found to have acute nondisplaced fractures of right L1 and L2 transverse processes as well as S3 and S4 vertebral bodies.  He had unchanged compression deformities of L4-L5.  He also has subacute appearing fractures of the right eighth  to 10th ribs and acute right 11th rib fracture.  During his hospital stay, he was noted to have an ammonia 57 and given lactulose.  His ammonia returned to normal.  At the time of his discharge to [facility], the patient was awake and alert and eating.  Prior to his admission to [facility] the patient was living alone, but his son and daughter were taking turns staying with him on 24-hour basis.  The patient had been fairly independent and doing well from a functional standpoint until he began having frequent falls beginning in September 2022.  Daughter did relate that he did experience some mild cognitive decline in the last 8 to 12 months.  He presents on 11/02/2021 from [facility] secondary to 3 to 4 days of increasing shortness of breath, coughing and chest congestion.  Chest x-ray showed pneumonia.  The patient apparently was started on some type of antibiotic.  He continued to have chest congestion and decreased oral intake and worsening generalized weakness.  As result, the patient was brought to the ER for further evaluation and treatment.  In the  ED, the patient had low-grade temperature 100.3.  He was tachycardic.  He had soft blood pressures.  The patient had oxygen saturation in the mid 80s on room air.  He was placed on a nonrebreather initially.  WBC 18.6, hemoglobin 11.3, platelets 223,000.  CT chest showed diffuse GGO and hypodense consolidation opacities in the bilateral lower lobes with debris in the trachea.  CT of the abdomen and pelvis showed a chronic left upper abdomen fluid density collection.  CT of the brain showed mild chronic small vessel disease.  ABG pH 7.3 36/44/37/21 on 80%.  Assessment/Plan: Severe Sepsis -Present on admission -Secondary to pneumonia -Lactic acid peaked 2.1>>1.5 -continue with gentle IV fluids as he is not eating / drinking -Continue empiric IV antibiotics - PCT is markedly elevated consistent with a bacterial infection   Aspiration pneumonia -Broadened antibiotic coverage to Zosyn -Continue azithromycin -Personally reviewed chest x-ray--bilateral patchy opacities, left greater than right -Speech therapy evaluation -MRSA screen--neg -12/29 CT chest--difuse GGO and consolidative opacities bilateral LL; debris in trachea; subacute rib fx  Dysphagia  - seen by SLP and placed on dys 1 diet - he remains high risk for recurrent aspiration   Severe protein calorie malnutrition  -Patient continues to refuse to eat despite encouragement from staff and family -Order had asked about feeding tube however I think this is a bad idea given patient irritability and likely will not tolerated -I have asked for palliative medicine consultation to discuss goals of care with family members -To discuss with family members regarding PEG placement  Acute respiratory failure with hypoxia -due to pneumonia -presented with saturation 87% on RA with tachypnea -stable on 4L -wean as tolerated to RA for saturation >92%  CKD stage IIIb -Baseline creatinine 1.3-1.6  Lumbar transverse process fractures and rib  fractures -Judicious opioids -PT as tolerated -planning for SNF when medically cleared   BPH -Continue tamsulosin  Myasthenia gravis -Continue Mestinon TID as long as he is able to swallow the pills   Lymphoblastic lymphoma -Currently in remission -Last received rituximab 02/02/2017 -Outpatient oncology follow-up  MGUS -Outpatient oncology follow-up  Diarrhea -C. difficile negative  Transaminasemis -due to sepsis -CT abd--Neg liver, neg GB -trending down  Abdominal fluid collection -likely benign -present since 2017  Pancreatic lesion - radiologist recommended outpatient MRI for follow up when patient clinically improved - at this time, not sure patient is going to have a meaningful recovery as he is not eating and drinking - outpatient surveillance  Status is: Inpatient  Remains inpatient appropriate because: severity of illness requiring IV abx and IVF  Family Communication:   daughter updated 12/20, 12/21   Consultants:  palliative care   Code Status:  DNR--confirmed with daughter  DVT Prophylaxis:  [place] Heparin   Procedures: As Listed in Progress Note Above  Antibiotics: Zosyn 12/20>> Azithro 12/29>>  Subjective: Patient continues to have very poor oral intake despite encouragement from staff and family members at bedside  Objective: Vitals:   11/04/21 0751 11/04/21 1000 11/04/21 1100 11/04/21 1200  BP:  (!) 146/51 (!) 154/51 105/70  Pulse:  71 80 (!) 153  Resp:  19 (!) 25 17  Temp: (!) 97.3 F (36.3 C)  (!) 96.9 F (36.1 C)   TempSrc: Axillary  Axillary   SpO2:  95% 98% 91%  Weight:      Height:        Intake/Output Summary (Last 24 hours) at 11/04/2021 1342 Last data filed at 11/04/2021 0600 Gross per 24 hour  Intake 1427.44 ml  Output 1050 ml  Net 377.44 ml   Weight change: 1.8 kg Exam:  General:  frail, emaciated, Pt is alert, does not follows command appropriately, not in acute distress HEENT: No icterus, No thrush, No neck  mass, [place]/AT Cardiovascular: normal S1/S2, no rubs, no gallops Respiratory: diminished BS.  Bilateral rales Abdomen: Soft/+BS, non tender, non distended, no guarding Extremities: No edema, No lymphangitis, No petechiae, No rashes, no synovitis  Data Reviewed: I have personally reviewed following labs and imaging studies Basic Metabolic Panel: Recent Labs  Lab 11/02/21 1325 11/03/21 0421 11/03/21 0450 11/04/21 0357  NA 141 143  --  145  K 4.3 3.8  --  3.2*  CL 105 110  --  113*  CO2 24 22  --  23  GLUCOSE 117* 109*  --  98  BUN 70* 67*  --  71*  CREATININE 1.66* 1.33*  --  1.40*  CALCIUM 9.4 8.8*  --  8.7*  MG  --   --  2.5* 2.6*   Liver Function Tests: Recent Labs  Lab 11/02/21 1325 11/03/21 0421 11/04/21 0357  AST 113* 78* 107*  ALT 79* 65* 81*  ALKPHOS 121 91 92  BILITOT 1.1 0.3 0.8  PROT 6.9 5.4* 4.8*  ALBUMIN 2.8* 2.2* 2.0*   No results for input(s): LIPASE, AMYLASE in the last 168 hours. Recent Labs  Lab 11/02/21 1328  AMMONIA 23   Coagulation Profile: Recent Labs  Lab 11/02/21 1325  INR 1.3*   CBC: Recent Labs  Lab 11/02/21  1325 11/03/21 0421 11/04/21 0357  WBC 18.6* 12.9* 6.9  NEUTROABS 16.9*  --   --   HGB 11.3* 8.6* 8.5*  HCT 36.2* 28.1* 28.2*  MCV 93.1 93.7 94.9  PLT 323 215 218   Cardiac Enzymes: Recent Labs  Lab 11/03/21 0450  CKTOTAL 37*   BNP: Invalid input(s): POCBNP CBG: Recent Labs  Lab 11/04/21 1122  GLUCAP 81   HbA1C: No results for input(s): HGBA1C in the last 72 hours. Urine analysis:    Component Value Date/Time   COLORURINE YELLOW 11/02/2021 1613   APPEARANCEUR HAZY (A) 11/02/2021 1613   LABSPEC 1.018 11/02/2021 1613   PHURINE 5.0 11/02/2021 1613   GLUCOSEU NEGATIVE 11/02/2021 1613   HGBUR SMALL (A) 11/02/2021 1613   BILIRUBINUR NEGATIVE 11/02/2021 1613   Early 11/02/2021 1613   PROTEINUR 30 (A) 11/02/2021 1613   NITRITE NEGATIVE 11/02/2021 1613   LEUKOCYTESUR MODERATE (A) 11/02/2021 1613    Recent Results (from the past 240 hour(s))  Culture, blood (Routine x 2)     Status: None (Preliminary result)   Collection Time: 11/02/21  1:23 PM   Specimen: BLOOD RIGHT FOREARM  Result Value Ref Range Status   Specimen Description BLOOD RIGHT FOREARM  Final   Special Requests   Final    BOTTLES DRAWN AEROBIC AND ANAEROBIC Blood Culture adequate volume Performed at [facility], [address] [place], [place] [address]    Culture PENDING  Incomplete   Report Status PENDING  Incomplete  Culture, blood (Routine x 2)     Status: None (Preliminary result)   Collection Time: 11/02/21  1:24 PM   Specimen: BLOOD LEFT HAND  Result Value Ref Range Status   Specimen Description BLOOD LEFT HAND  Final   Special Requests   Final    BOTTLES DRAWN AEROBIC AND ANAEROBIC Blood Culture results may not be optimal due to an inadequate volume of blood received in culture bottles Performed at [facility], [address], [place], [place] [address]    Culture PENDING  Incomplete   Report Status PENDING  Incomplete  Resp Panel by RT-PCR (Flu A&B, Covid) Nasopharyngeal Swab     Status: None   Collection Time: 11/02/21  1:25 PM   Specimen: Nasopharyngeal Swab; Nasopharyngeal(NP) swabs in vial transport medium  Result Value Ref Range Status   SARS Coronavirus 2 by RT PCR NEGATIVE NEGATIVE Final    Comment: (NOTE) SARS-CoV-2 target nucleic acids are NOT DETECTED.  The SARS-CoV-2 RNA is generally detectable in upper respiratory specimens during the acute phase of infection. The lowest concentration of SARS-CoV-2 viral copies this assay can detect is 138 copies/mL. A negative result does not preclude SARS-Cov-2 infection and should not be used as the sole basis for treatment or other patient management decisions. A negative result may occur with  improper specimen collection/handling, submission of specimen other than nasopharyngeal swab, presence of viral mutation(s) within the areas targeted by  this assay, and inadequate number of viral copies(<138 copies/mL). A negative result must be combined with clinical observations, patient history, and epidemiological information. The expected result is Negative.  Fact Sheet for Patients:  EntrepreneurPulse.com.au  Fact Sheet for Healthcare Providers:  IncredibleEmployment.be  This test is no t yet approved or cleared by the [place] FDA and  has been authorized for detection and/or diagnosis of SARS-CoV-2 by FDA under an Emergency Use Authorization (EUA). This EUA will remain  in effect (meaning this test can be used) for the duration of the COVID-19 declaration  under Section 564(b)(1) of the Act, 21 U.S.C.section 360bbb-3(b)(1), unless the authorization is terminated  or revoked sooner.       Influenza A by PCR NEGATIVE NEGATIVE Final   Influenza B by PCR NEGATIVE NEGATIVE Final    Comment: (NOTE) The Xpert Xpress SARS-CoV-2/FLU/RSV plus assay is intended as an aid in the diagnosis of influenza from Nasopharyngeal swab specimens and should not be used as a sole basis for treatment. Nasal washings and aspirates are unacceptable for Xpert Xpress SARS-CoV-2/FLU/RSV testing.  Fact Sheet for Patients: EntrepreneurPulse.com.au  Fact Sheet for Healthcare Providers: IncredibleEmployment.be  This test is not yet approved or cleared by the [place] FDA and has been authorized for detection and/or diagnosis of SARS-CoV-2 by FDA under an Emergency Use Authorization (EUA). This EUA will remain in effect (meaning this test can be used) for the duration of the COVID-19 declaration under Section 564(b)(1) of the Act, 21 U.S.C. section 360bbb-3(b)(1), unless the authorization is terminated or revoked.  Performed at [facility], [address], [place], [place] [address]   Urine Culture     Status: Abnormal   Collection Time: 11/02/21  4:13 PM   Specimen:  Urine, Catheterized  Result Value Ref Range Status   Specimen Description   Final    URINE, CATHETERIZED Performed at [facility], [address] [place], [place] [address]    Special Requests   Final    NONE Performed at [facility], [address] [place], [place] [address]    Culture MULTIPLE SPECIES PRESENT, SUGGEST RECOLLECTION (A)  Final   Report Status 11/03/2021 FINAL  Final  C Difficile Quick Screen w PCR reflex     Status: None   Collection Time: 11/02/21  4:14 PM   Specimen: STOOL  Result Value Ref Range Status   C Diff antigen NEGATIVE NEGATIVE Final   C Diff toxin NEGATIVE NEGATIVE Final   C Diff interpretation No C. difficile detected.  Final    Comment: Performed at [facility], [address] [place], [place] [address]  MRSA Next Gen by PCR, Nasal     Status: None   Collection Time: 11/02/21  7:50 PM   Specimen: Nasal Mucosa; Nasal Swab  Result Value Ref Range Status   MRSA by PCR Next Gen NOT DETECTED NOT DETECTED Final    Comment: (NOTE) The GeneXpert MRSA Assay (FDA approved for NASAL specimens only), is one component of a comprehensive MRSA colonization surveillance program. It is not intended to diagnose MRSA infection nor to guide or monitor treatment for MRSA infections. Test performance is not FDA approved in patients less than 6 years old. Performed at [facility], [address], [place], [place] [address]      Scheduled Meds:  Chlorhexidine Gluconate Cloth  6 each Topical Daily   heparin  5,000 Units Subcutaneous Q8H   pyridostigmine  30 mg Oral Q8H   tamsulosin  0.4 mg Oral QPC supper   Continuous Infusions:  azithromycin 500 mg (11/03/21 1355)   lactated ringers 75 mL/hr at 11/03/21 1026   piperacillin-tazobactam (ZOSYN)  IV 3.375 g (11/04/21 0849)   Procedures/Studies: CT Head Wo Contrast  Result Date: 11/02/2021 CLINICAL DATA:  Sepsis.  Fall.  Head trauma. EXAM: CT HEAD WITHOUT CONTRAST TECHNIQUE: Contiguous axial images were  obtained from the base of the skull through the vertex without intravenous contrast. COMPARISON:  10/19/2021 FINDINGS: Brain: Generalized brain atrophy. Mild chronic small-vessel ischemic change of the white matter. No sign of acute infarction, mass lesion,  hemorrhage, hydrocephalus or extra-axial collection. Vascular: There is atherosclerotic calcification of the major vessels at the base of the brain. Skull: Negative Sinuses/Orbits: Clear/normal Other: None IMPRESSION: No acute or traumatic finding. Age related atrophy. Mild chronic small-vessel ischemic change of the white matter. Electronically Signed   By: Nelson Chimes M.D.   On: 11/02/2021 16:26   CT CHEST ABDOMEN PELVIS W CONTRAST  Result Date: 11/02/2021 CLINICAL DATA:  Polytrauma, blunt fall, hypoxia, sepsis EXAM: CT CHEST, ABDOMEN, AND PELVIS WITH CONTRAST TECHNIQUE: Multidetector CT imaging of the chest, abdomen and pelvis was performed following the standard protocol during bolus administration of intravenous contrast. CONTRAST:  41mL OMNIPAQUE IOHEXOL 300 MG/ML  SOLN COMPARISON:  CT chest, abdomen, pelvis 10/19/2021. CT chest abdomen pelvis 09/21/2021 FINDINGS: CHEST: Ports and Devices: None. Lungs/airways: Diffuse peribronchovascular ground-glass and hypodense heterogeneous consolidative airspace opacities that are most prominent within the bilateral lower lobes. Limited evaluation for pulmonary nodule due to motion artifact and consolidation. No pulmonary mass. No pulmonary contusion or laceration. No pneumatocele formation. Debris within the trachea. Otherwise the central airways are patent. Pleura: No pleural effusion. No pneumothorax. No hemothorax. Lymph Nodes: Enlarged mediastinal lymph nodes: 1.3 cm aortopulmonary window. No hilar or axillary lymphadenopathy. Mediastinum: No pneumomediastinum. No aortic injury or mediastinal hematoma. The thoracic aorta is normal in caliber. At least moderate atherosclerotic plaque. Coronary artery  calcifications. The heart is normal in size. No significant pericardial effusion. The main pulmonary artery is normal in caliber. No central pulmonary embolus. The esophagus is unremarkable. The thyroid is unremarkable. Chest Wall / Breasts: No chest wall mass. Musculoskeletal: Diffusely decreased bone density. No acute rib or sternal fracture; however, markedly limited evaluation due to respiratory motion artifact. Known subacute rib fractures are again noted and are poorly evaluation due to motion artifact. No acute spinal fracture. ABDOMEN / PELVIS: Liver: Not enlarged. No focal lesion. No laceration or subcapsular hematoma. Biliary System: The gallbladder is otherwise unremarkable with no radio-opaque gallstones. No biliary ductal dilatation. Pancreas: Atrophic. There is a 1.2 x 1 cm hypodense lesion within the proximal pancreatic body (2:72). No main pancreatic duct dilatation. Spleen: Not enlarged. No focal lesion. No laceration, subcapsular hematoma, or vascular injury. Adrenal Glands: No nodularity bilaterally. Kidneys: Bilateral kidneys enhance symmetrically. No hydronephrosis. No contusion, laceration, or subcapsular hematoma. No injury to the vascular structures or collecting systems. No hydroureter. The urinary bladder is unremarkable. Bowel: No small or large bowel wall thickening or dilatation. Scattered colonic diverticulosis. The appendix is unremarkable. Mesentery, Omentum, and Peritoneum: Redemonstration of a grossly similar-appearing 3.6 x 3.6 x 5.2 Cm fluid density lesion within the left upper abdomen. No associated peripheral enhancement. No simple free fluid ascites. No pneumoperitoneum. No hemoperitoneum. No mesenteric hematoma identified. No organized fluid collection. Pelvic Organs: Normal. Lymph Nodes: No abdominal, pelvic, inguinal lymphadenopathy. Vasculature: The main portal, splenic, superior mesenteric veins are patent. Atherosclerotic plaque. No abdominal aorta or iliac aneurysm. No  active contrast extravasation or pseudoaneurysm. Musculoskeletal: No significant soft tissue hematoma. Diffusely decreased bone density. No acute pelvic fracture. Chronic stable L4-L5 compression fractures. No acute spinal fracture. Redemonstration of subacute right L1-L2 transverse process fractures. Redemonstration of subacute S3 and S4 vertebral body fractures. IMPRESSION: 1. Pulmonary findings consistent with infection/inflammation. Associated debris within the trachea. Followup PA and lateral chest X-ray is recommended in 3-4 weeks following therapy to ensure resolution and exclude underlying malignancy. 2. Mediastinal lymphadenopathy likely reactive in etiology. Attention on follow-up. 3. No acute traumatic injury to the chest, abdomen, or pelvis. 4. No  acute fracture or traumatic malalignment of the thoracic or lumbar spine.Redemonstration of subacute right L1-L2 transverse process as well as S3 and S4 fractures. These were acute on 10/19/2021. 5. Markedly limited evaluation of the ribs for acute fracture due to respiratory motion artifact. Known subacute rib fractures are again noted but poorly evaluated due to motion artifact. Other imaging findings of potential clinical significance: 1. Grossly similar-appearing 3.6 x 3.6 x 5.2cm fluid density lesion within the left upper abdomen. 2. Slightly more prominent due to administration of intravenous contrast, indeterminate 1.2 x 1 cm hypodense lesion within the proximal pancreatic body-consider nonemergent MRI pancreatic protocol. When the patient is clinically stable and able to follow directions and hold their breath (preferably as an outpatient) further evaluation with dedicated abdominal MRI should be considered. 3. Aortic Atherosclerosis (ICD10-I70.0) including coronary artery calcifications. 4. Diffusely decreased bone density. Electronically Signed   By: Iven Finn M.D.   On: 11/02/2021 16:46   DG Chest Portable 1 View  Result Date:  11/02/2021 CLINICAL DATA:  Pt diagnosed with pneumonia at [facility] on 11/01/2021, Cough, sob, congestion Neg covid 09/11/21 EXAM: PORTABLE CHEST - 1 VIEW COMPARISON:  10/19/2021 FINDINGS: New left perihilar airspace disease and some patchy left retrocardiac infiltrate. Right lung remains clear. Heart size and mediastinal contours are within normal limits. Aortic Atherosclerosis (ICD10-170.0). No effusion.  No pneumothorax. Visualized bones unremarkable. IMPRESSION: 1. New left perihilar airspace disease and patchy left retrocardiac opacities suggesting pneumonia. Electronically Signed   By: Lucrezia Europe M.D.   On: 11/02/2021 15:21    Makylah Bossard Wynetta Emery, MD How to contact the [facility] Attending or Consulting provider [place] or covering provider during after hours [place], for this patient?  Check the care team in [facility] and look for a) attending/consulting TR[facility] provider listed and b) the [facility] team listed Log into [email or web address] and use [place]'s universal password to access. If you do not have the password, please contact the hospital operator. Locate the [facility] provider you are looking for under [facility] and page to a number that you can be directly reached. If you still have difficulty reaching the provider, please page the [facility] (Director on Call) for the Hospitalists listed on amion for assistance.   [facility]  If 7PM-7AM, please contact night-coverage [email or web address] Password [email or web address] 11/04/2021, 1:42 PM   LOS: 2 days

## 2021-11-05 ENCOUNTER — Inpatient Hospital Stay (HOSPITAL_COMMUNITY)
Admission: RE | Admit: 2021-11-05 | Discharge: 2021-11-15 | DRG: 951 | Disposition: E | Payer: Medicare Other | Source: Hospice | Attending: Family Medicine | Admitting: Family Medicine

## 2021-11-05 DIAGNOSIS — C8800 Waldenstrom macroglobulinemia not having achieved remission: Secondary | ICD-10-CM | POA: Diagnosis present

## 2021-11-05 DIAGNOSIS — R296 Repeated falls: Secondary | ICD-10-CM

## 2021-11-05 DIAGNOSIS — R627 Adult failure to thrive: Secondary | ICD-10-CM

## 2021-11-05 DIAGNOSIS — L8990 Pressure ulcer of unspecified site, unspecified stage: Secondary | ICD-10-CM | POA: Diagnosis present

## 2021-11-05 DIAGNOSIS — D472 Monoclonal gammopathy: Secondary | ICD-10-CM | POA: Diagnosis present

## 2021-11-05 DIAGNOSIS — R1310 Dysphagia, unspecified: Secondary | ICD-10-CM | POA: Diagnosis present

## 2021-11-05 DIAGNOSIS — Z515 Encounter for palliative care: Secondary | ICD-10-CM

## 2021-11-05 DIAGNOSIS — J9601 Acute respiratory failure with hypoxia: Secondary | ICD-10-CM | POA: Diagnosis present

## 2021-11-05 DIAGNOSIS — N184 Chronic kidney disease, stage 4 (severe): Secondary | ICD-10-CM | POA: Diagnosis present

## 2021-11-05 DIAGNOSIS — N179 Acute kidney failure, unspecified: Secondary | ICD-10-CM | POA: Diagnosis present

## 2021-11-05 DIAGNOSIS — D61818 Other pancytopenia: Secondary | ICD-10-CM | POA: Diagnosis present

## 2021-11-05 DIAGNOSIS — D649 Anemia, unspecified: Secondary | ICD-10-CM | POA: Diagnosis present

## 2021-11-05 DIAGNOSIS — C9110 Chronic lymphocytic leukemia of B-cell type not having achieved remission: Secondary | ICD-10-CM | POA: Diagnosis present

## 2021-11-05 DIAGNOSIS — A419 Sepsis, unspecified organism: Secondary | ICD-10-CM | POA: Diagnosis present

## 2021-11-05 DIAGNOSIS — R188 Other ascites: Secondary | ICD-10-CM | POA: Diagnosis present

## 2021-11-05 DIAGNOSIS — R6520 Severe sepsis without septic shock: Secondary | ICD-10-CM | POA: Diagnosis present

## 2021-11-05 DIAGNOSIS — R197 Diarrhea, unspecified: Secondary | ICD-10-CM | POA: Diagnosis present

## 2021-11-05 DIAGNOSIS — Z87891 Personal history of nicotine dependence: Secondary | ICD-10-CM | POA: Diagnosis not present

## 2021-11-05 DIAGNOSIS — W19XXXD Unspecified fall, subsequent encounter: Secondary | ICD-10-CM | POA: Diagnosis present

## 2021-11-05 DIAGNOSIS — S2241XD Multiple fractures of ribs, right side, subsequent encounter for fracture with routine healing: Secondary | ICD-10-CM

## 2021-11-05 DIAGNOSIS — J189 Pneumonia, unspecified organism: Secondary | ICD-10-CM | POA: Diagnosis present

## 2021-11-05 DIAGNOSIS — K869 Disease of pancreas, unspecified: Secondary | ICD-10-CM | POA: Diagnosis present

## 2021-11-05 DIAGNOSIS — C9111 Chronic lymphocytic leukemia of B-cell type in remission: Secondary | ICD-10-CM | POA: Diagnosis present

## 2021-11-05 DIAGNOSIS — D479 Neoplasm of uncertain behavior of lymphoid, hematopoietic and related tissue, unspecified: Secondary | ICD-10-CM | POA: Diagnosis present

## 2021-11-05 DIAGNOSIS — Z9181 History of falling: Secondary | ICD-10-CM

## 2021-11-05 DIAGNOSIS — N400 Enlarged prostate without lower urinary tract symptoms: Secondary | ICD-10-CM | POA: Diagnosis present

## 2021-11-05 DIAGNOSIS — R7401 Elevation of levels of liver transaminase levels: Secondary | ICD-10-CM | POA: Diagnosis present

## 2021-11-05 DIAGNOSIS — S32018D Other fracture of first lumbar vertebra, subsequent encounter for fracture with routine healing: Secondary | ICD-10-CM

## 2021-11-05 DIAGNOSIS — E43 Unspecified severe protein-calorie malnutrition: Secondary | ICD-10-CM | POA: Diagnosis present

## 2021-11-05 DIAGNOSIS — C8300 Small cell B-cell lymphoma, unspecified site: Secondary | ICD-10-CM | POA: Diagnosis present

## 2021-11-05 DIAGNOSIS — J690 Pneumonitis due to inhalation of food and vomit: Secondary | ICD-10-CM | POA: Diagnosis present

## 2021-11-05 DIAGNOSIS — G7000 Myasthenia gravis without (acute) exacerbation: Secondary | ICD-10-CM | POA: Diagnosis present

## 2021-11-05 DIAGNOSIS — Z66 Do not resuscitate: Secondary | ICD-10-CM | POA: Diagnosis present

## 2021-11-05 DIAGNOSIS — S32028D Other fracture of second lumbar vertebra, subsequent encounter for fracture with routine healing: Secondary | ICD-10-CM

## 2021-11-05 DIAGNOSIS — E860 Dehydration: Secondary | ICD-10-CM | POA: Diagnosis present

## 2021-11-05 DIAGNOSIS — Z9189 Other specified personal risk factors, not elsewhere classified: Secondary | ICD-10-CM

## 2021-11-05 MED ORDER — POLYVINYL ALCOHOL 1.4 % OP SOLN: 1.0000 [drp] | Freq: Four times a day (QID) | OPHTHALMIC | Status: DC | PRN

## 2021-11-05 MED ORDER — HALOPERIDOL LACTATE 2 MG/ML PO CONC
0.5000 mg | ORAL | Status: DC | PRN
  Filled 2021-11-05: qty 0.3

## 2021-11-05 MED ORDER — BIOTENE DRY MOUTH MT LIQD: 15.0000 mL | OROMUCOSAL | Status: DC | PRN

## 2021-11-05 MED ORDER — HALOPERIDOL LACTATE 5 MG/ML IJ SOLN: 0.5000 mg | INTRAMUSCULAR | Status: DC | PRN

## 2021-11-05 MED ORDER — HYDROMORPHONE HCL 1 MG/ML IJ SOLN
1.0000 mg | INTRAMUSCULAR | Status: DC
  Filled 2021-11-05: qty 1

## 2021-11-05 MED ORDER — GLYCOPYRROLATE 0.2 MG/ML IJ SOLN: 0.2000 mg | INTRAMUSCULAR | Status: DC | PRN

## 2021-11-05 MED ORDER — HALOPERIDOL 0.5 MG PO TABS: 0.5000 mg | ORAL_TABLET | ORAL | Status: DC | PRN

## 2021-11-05 MED ORDER — ACETAMINOPHEN 650 MG RE SUPP: 650.0000 mg | Freq: Four times a day (QID) | RECTAL | Status: DC | PRN

## 2021-11-05 MED ORDER — GLYCOPYRROLATE 1 MG PO TABS: 1.0000 mg | ORAL_TABLET | ORAL | Status: DC | PRN

## 2021-11-05 MED ORDER — HYDROMORPHONE HCL 1 MG/ML IJ SOLN
0.5000 mg | INTRAMUSCULAR | Status: DC | PRN
  Administered 2021-11-05 – 2021-11-09 (×2): 0.5 mg via INTRAVENOUS
  Filled 2021-11-05: qty 0.5

## 2021-11-05 MED ORDER — ONDANSETRON HCL 4 MG/2ML IJ SOLN: 4.0000 mg | Freq: Four times a day (QID) | INTRAMUSCULAR | Status: DC | PRN

## 2021-11-05 MED ORDER — ONDANSETRON HCL 4 MG PO TABS: 4.0000 mg | ORAL_TABLET | Freq: Four times a day (QID) | ORAL | Status: DC | PRN

## 2021-11-05 MED ORDER — LORAZEPAM 2 MG/ML IJ SOLN
2.0000 mg | INTRAMUSCULAR | Status: DC | PRN
  Administered 2021-11-07 – 2021-11-09 (×4): 2 mg via INTRAVENOUS
  Filled 2021-11-05 (×4): qty 1

## 2021-11-05 MED ORDER — ACETAMINOPHEN 325 MG PO TABS: 650.0000 mg | ORAL_TABLET | Freq: Four times a day (QID) | ORAL | Status: DC | PRN

## 2021-11-05 MED ORDER — HYDROMORPHONE HCL 1 MG/ML IJ SOLN
1.0000 mg | INTRAMUSCULAR | Status: DC
  Administered 2021-11-05 – 2021-11-09 (×22): 1 mg via INTRAVENOUS
  Filled 2021-11-05 (×22): qty 1

## 2021-11-05 NOTE — Progress Notes (Signed)
Report called to [facility], all questions answered. Patient to be transferred to 300 unit room 324

## 2021-11-05 NOTE — Progress Notes (Signed)
Daily Progress Note   Patient Name: Paul Keith       Date: 11/06/2021 DOB: [birth year]  Age: 87 y.o. MRN#: [id number] Attending Physician: Paul Iba, MD Primary Care Physician: Paul Finlay, MD Admit Date: 11/02/2021  Reason for Consultation/Follow-up: Establishing goals of care  Patient Profile/HPI: 87 y.o. male  with past medical history of CLL (s/p chemotx in remission), myasthenia gravis, frequent falls with trauma resulting in multiple lumbar and rib fractures, admitted on 11/02/2021 with sepsis due to pneumonia as well as new finding of pancreatic lesion on CT. He was hypoxic- now on 4L [place].Palliative consulted for [place] and family asking about feeding tube options.      Subjective: Spoke with patient's daughter Paul Keith. They would like to have patient transfer to [place].  Evaluated patient- he was with labored breathing, furrowed brow. Patient's son at bedside. Discussed care and symptom management with son and nurse.   Review of Systems  Unable to perform ROS: Acuity of condition    Physical Exam Vitals and nursing note reviewed.  Constitutional:      Appearance: He is ill-appearing.     Comments: Looks uncomfortable  Cardiovascular:     Rate and Rhythm: Normal rate.  Pulmonary:     Comments: labored           Vital Signs: BP 135/63    Pulse 95    Temp (!) 97.5 F (36.4 C)    Resp (!) 35    Ht 6' (1.829 m)    Wt 55.3 kg    SpO2 92%    BMI 16.53 kg/m  SpO2: SpO2: 92 % O2 Device: O2 Device: Nasal Cannula O2 Flow Rate: O2 Flow Rate (L/min): 4 L/min  Intake/output summary:  Intake/Output Summary (Last 24 hours) at 11/10/2021 1059 Last data filed at 10/28/2021 0500 Gross per 24 hour  Intake 796.4 ml  Output 1850 ml  Net -1053.6 ml    LBM: Last BM Date: 11/04/21 Baseline Weight: Weight: 55.2 kg Most recent weight: Weight: 55.3 kg       Palliative Assessment/Data: PPS: 10%      Patient Active Problem List   Diagnosis Date Noted   Aspiration pneumonitis ([place]) 11/03/2021   Sepsis due to undetermined organism ([place]) 11/03/2021   Protein-calorie malnutrition, severe 11/03/2021   Pressure  injury of skin 11/03/2021   Severe sepsis ([place]) 11/02/2021   Acute respiratory failure with hypoxia ([place]) 11/02/2021   Acute lower UTI 11/02/2021   Pancreatic lesion 11/02/2021   Stage 2 chronic kidney disease 10/20/2021   CLL (chronic lymphocytic leukemia) ([place]) 10/20/2021   Frequent falls 10/19/2021   Uremia 09/21/2021   Diarrhea 09/17/2021   Acute renal failure superimposed on stage 4 chronic kidney disease ([place]) 09/11/2021   HLD (hyperlipidemia) 09/11/2021   Myasthenia gravis ([place]) 09/11/2021   BPH (benign prostatic hyperplasia) 09/10/2021   Arthralgia of right knee 08/03/2021   PNA (pneumonia) 04/24/2018   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 is dying from failure to thrive, respiratory failure in the setting of sepsis pneumonia- plan is for full comfort measure only Will add scheduled hydromorphone 1mg  q4hrs in addition to prn hydromorphone TOC referral for hospice house in [place] Status: DNR  Prognosis:  < 2 weeks  Discharge Planning: Hospice facility  Care plan was discussed with patient's family and care team.    Thank you for allowing the Palliative Medicine Team to assist in the care of this patient.  Total time: 40 minutes     Greater than 50%  of this time was spent counseling and coordinating care related to the above assessment and plan.  Paul Keith, AGNP-C Palliative Medicine   Please contact Palliative Medicine Team phone at [phone] for questions and concerns.

## 2021-11-05 NOTE — Discharge Summary (Signed)
Physician Discharge Summary  Paul Keith VOJ:[id number] DOB: Aug 31, 1930 DOA: 11/02/2021  PCP: Wilburt Finlay, MD  Admit date: 11/02/2021 Discharge date: 11/14/2021  Disposition: HOSPICE   Recommendations for Outpatient Follow-up:  SYMPTOM MANAGEMENT PER HOSPICE PROTOCOL   Discharge Condition: HOSPICE   CODE STATUS: DNR    Brief Hospitalization Summary: Please see all hospital notes, images, labs for full details of the hospitalization. Brief Presentation History:  87 year old male with a history of CKD stage III, NHL (lymphoblastic lymphoma) in remission, hyperlipidemia, myasthenia gravis, BPH, MGUS presenting from [facility] secondary to 3-day history of progressive generalized weakness, decreased oral intake, and pneumonia.  Notably, the patient was recently admitted to [facility] from 10/20/2019 to 10/22/2021 for altered mental status and frequent falls.  During his hospital stay there, the patient was confused and had hospital delirium.  He was found to have acute nondisplaced fractures of right L1 and L2 transverse processes as well as S3 and S4 vertebral bodies.  He had unchanged compression deformities of L4-L5.  He also has subacute appearing fractures of the right eighth  to 10th ribs and acute right 11th rib fracture.  During his hospital stay, he was noted to have an ammonia 57 and given lactulose.  His ammonia returned to normal.  At the time of his discharge to [facility], the patient was awake and alert and eating.   Prior to his admission to [facility] the patient was living alone, but his son and daughter were taking turns staying with him on 24-hour basis.  The patient had been fairly independent and doing well from a functional standpoint until he began having frequent falls beginning in September 2022.  Daughter did relate that he did experience some mild cognitive decline in the last 8 to 12 months.   He presents on 11/02/2021 from [facility] secondary to 3 to 4 days of  increasing shortness of breath, coughing and chest congestion.  Chest x-ray showed pneumonia.  The patient apparently was started on some type of antibiotic.  He continued to have chest congestion and decreased oral intake and worsening generalized weakness.  As result, the patient was brought to the ER for further evaluation and treatment.  In the ED, the patient had low-grade temperature 100.3.  He was tachycardic.  He had soft blood pressures.  The patient had oxygen saturation in the mid 80s on room air.  He was placed on a nonrebreather initially.  WBC 18.6, hemoglobin 11.3, platelets 223,000.  CT chest showed diffuse GGO and hypodense consolidation opacities in the bilateral lower lobes with debris in the trachea.  CT of the abdomen and pelvis showed a chronic left upper abdomen fluid density collection.  CT of the brain showed mild chronic small vessel disease.  ABG pH 7.3 36/44/37/21 on 80%.   Assessment/Plan: Severe Sepsis -Present on admission -Secondary to pneumonia -Lactic acid peaked 2.1>>1.5 -continue with gentle IV fluids as he is not eating / drinking -Continue empiric IV antibiotics - PCT is markedly elevated consistent with a bacterial infection  - Pt continued with decline and no positive response to treatments, after discussion with palliative care decision made by family for starting full comfort care measures on 11/04/21.    Aspiration pneumonia -Broadened antibiotic coverage to Zosyn -Continue azithromycin -Personally reviewed chest x-ray--bilateral patchy opacities, left greater than right -Speech therapy evaluation -MRSA screen--neg -12/29 CT chest--difuse GGO and consolidative opacities bilateral LL; debris in trachea; subacute rib fx - Pt continued with decline and no positive response to  treatments, after discussion with palliative care decision made by family for starting full comfort care measures on 11/04/21.   Dysphagia  - seen by SLP and placed on dys 1 diet -  he remains high risk for recurrent aspiration    Severe protein calorie malnutrition  -Patient continues to refuse to eat despite encouragement from staff and family -Order had asked about feeding tube however I think this is a bad idea given patient irritability and likely will not tolerated -I have asked for palliative medicine consultation to discuss goals of care with family members -To discuss with family members regarding PEG placement.  Decision made by family to pursue full comfort measures.    Acute respiratory failure with hypoxia -due to pneumonia -presented with saturation 87% on RA with tachypnea -stable on 4L -wean as tolerated to RA for saturation >92% - Pt continued with decline and no positive response to treatments, after discussion with palliative care decision made by family for starting full comfort care measures on 11/04/21. \  CKD stage IIIb -Baseline creatinine 1.3-1.6   Lumbar transverse process fractures and rib fractures -Judicious opioids -PT as tolerated -planning for SNF when medically cleared    BPH -Continue tamsulosin   Myasthenia gravis -Continue M[place]on TID as long as he is able to swallow the pills    Lymphoblastic lymphoma -Currently in remission -Last received rituximab 02/02/2017 -Outpatient oncology follow-up   MGUS -Outpatient oncology follow-up   Diarrhea -C. difficile negative   Transaminitis  -due to sepsis -CT abd--Neg liver, neg GB -trending down   Abdominal fluid collection -likely benign -present since 2017   Pancreatic lesion - radiologist recommended outpatient MRI for follow up when patient clinically improved - at this time, not sure patient is going to have a meaningful recovery as he is not eating and drinking - outpatient surveillance   Status is: Inpatient   Remains inpatient appropriate because: severity of illness requiring IV abx and IVF   Family Communication:   daughter updated 12/20, 12/21     Consultants:  palliative care    Code Status:  DNR--confirmed with daughter   DVT Prophylaxis:  [place] Heparin    Procedures: As Listed in Progress Note Above   Discharge Diagnoses:  Principal Problem:   Severe sepsis ([place]) Active Problems:   Myasthenia gravis ([place])   PNA (pneumonia)   CLL (chronic lymphocytic leukemia) ([place])   Diarrhea   Acute respiratory failure with hypoxia ([place])   Acute lower UTI   Pancreatic lesion   Aspiration pneumonitis ([place])   Sepsis due to undetermined organism ([place])   Protein-calorie malnutrition, severe   Pressure injury of skin   Discharge Instructions:    Procedures/Studies: CT Head Wo Contrast  Result Date: 11/02/2021 CLINICAL DATA:  Sepsis.  Fall.  Head trauma. EXAM: CT HEAD WITHOUT CONTRAST TECHNIQUE: Contiguous axial images were obtained from the base of the skull through the vertex without intravenous contrast. COMPARISON:  10/19/2021 FINDINGS: Brain: Generalized brain atrophy. Mild chronic small-vessel ischemic change of the white matter. No sign of acute infarction, mass lesion, hemorrhage, hydrocephalus or extra-axial collection. Vascular: There is atherosclerotic calcification of the major vessels at the base of the brain. Skull: Negative Sinuses/Orbits: Clear/normal Other: None IMPRESSION: No acute or traumatic finding. Age related atrophy. Mild chronic small-vessel ischemic change of the white matter. Electronically Signed   By: Nelson Chimes M.D.   On: 11/02/2021 16:26   CT CHEST ABDOMEN PELVIS W CONTRAST  Result Date: 11/02/2021 CLINICAL DATA:  Polytrauma, blunt  fall, hypoxia, sepsis EXAM: CT CHEST, ABDOMEN, AND PELVIS WITH CONTRAST TECHNIQUE: Multidetector CT imaging of the chest, abdomen and pelvis was performed following the standard protocol during bolus administration of intravenous contrast. CONTRAST:  26mL OMNIPAQUE IOHEXOL 300 MG/ML  SOLN COMPARISON:  CT chest, abdomen, pelvis 10/19/2021. CT chest abdomen pelvis 09/21/2021  FINDINGS: CHEST: Ports and Devices: None. Lungs/airways: Diffuse peribronchovascular ground-glass and hypodense heterogeneous consolidative airspace opacities that are most prominent within the bilateral lower lobes. Limited evaluation for pulmonary nodule due to motion artifact and consolidation. No pulmonary mass. No pulmonary contusion or laceration. No pneumatocele formation. Debris within the trachea. Otherwise the central airways are patent. Pleura: No pleural effusion. No pneumothorax. No hemothorax. Lymph Nodes: Enlarged mediastinal lymph nodes: 1.3 cm aortopulmonary window. No hilar or axillary lymphadenopathy. Mediastinum: No pneumomediastinum. No aortic injury or mediastinal hematoma. The thoracic aorta is normal in caliber. At least moderate atherosclerotic plaque. Coronary artery calcifications. The heart is normal in size. No significant pericardial effusion. The main pulmonary artery is normal in caliber. No central pulmonary embolus. The esophagus is unremarkable. The thyroid is unremarkable. Chest Wall / Breasts: No chest wall mass. Musculoskeletal: Diffusely decreased bone density. No acute rib or sternal fracture; however, markedly limited evaluation due to respiratory motion artifact. Known subacute rib fractures are again noted and are poorly evaluation due to motion artifact. No acute spinal fracture. ABDOMEN / PELVIS: Liver: Not enlarged. No focal lesion. No laceration or subcapsular hematoma. Biliary System: The gallbladder is otherwise unremarkable with no radio-opaque gallstones. No biliary ductal dilatation. Pancreas: Atrophic. There is a 1.2 x 1 cm hypodense lesion within the proximal pancreatic body (2:72). No main pancreatic duct dilatation. Spleen: Not enlarged. No focal lesion. No laceration, subcapsular hematoma, or vascular injury. Adrenal Glands: No nodularity bilaterally. Kidneys: Bilateral kidneys enhance symmetrically. No hydronephrosis. No contusion, laceration, or subcapsular  hematoma. No injury to the vascular structures or collecting systems. No hydroureter. The urinary bladder is unremarkable. Bowel: No small or large bowel wall thickening or dilatation. Scattered colonic diverticulosis. The appendix is unremarkable. Mesentery, Omentum, and Peritoneum: Redemonstration of a grossly similar-appearing 3.6 x 3.6 x 5.2 Cm fluid density lesion within the left upper abdomen. No associated peripheral enhancement. No simple free fluid ascites. No pneumoperitoneum. No hemoperitoneum. No mesenteric hematoma identified. No organized fluid collection. Pelvic Organs: Normal. Lymph Nodes: No abdominal, pelvic, inguinal lymphadenopathy. Vasculature: The main portal, splenic, superior mesenteric veins are patent. Atherosclerotic plaque. No abdominal aorta or iliac aneurysm. No active contrast extravasation or pseudoaneurysm. Musculoskeletal: No significant soft tissue hematoma. Diffusely decreased bone density. No acute pelvic fracture. Chronic stable L4-L5 compression fractures. No acute spinal fracture. Redemonstration of subacute right L1-L2 transverse process fractures. Redemonstration of subacute S3 and S4 vertebral body fractures. IMPRESSION: 1. Pulmonary findings consistent with infection/inflammation. Associated debris within the trachea. Followup PA and lateral chest X-ray is recommended in 3-4 weeks following therapy to ensure resolution and exclude underlying malignancy. 2. Mediastinal lymphadenopathy likely reactive in etiology. Attention on follow-up. 3. No acute traumatic injury to the chest, abdomen, or pelvis. 4. No acute fracture or traumatic malalignment of the thoracic or lumbar spine.Redemonstration of subacute right L1-L2 transverse process as well as S3 and S4 fractures. These were acute on 10/19/2021. 5. Markedly limited evaluation of the ribs for acute fracture due to respiratory motion artifact. Known subacute rib fractures are again noted but poorly evaluated due to motion  artifact. Other imaging findings of potential clinical significance: 1. Grossly similar-appearing 3.6 x 3.6 x 5.2cm fluid  density lesion within the left upper abdomen. 2. Slightly more prominent due to administration of intravenous contrast, indeterminate 1.2 x 1 cm hypodense lesion within the proximal pancreatic body-consider nonemergent MRI pancreatic protocol. When the patient is clinically stable and able to follow directions and hold their breath (preferably as an outpatient) further evaluation with dedicated abdominal MRI should be considered. 3. Aortic Atherosclerosis (ICD10-I70.0) including coronary artery calcifications. 4. Diffusely decreased bone density. Electronically Signed   By: Iven Finn M.D.   On: 11/02/2021 16:46   DG Chest Portable 1 View  Result Date: 11/02/2021 CLINICAL DATA:  Pt diagnosed with pneumonia at [facility] on 11/01/2021, Cough, sob, congestion Neg covid 09/11/21 EXAM: PORTABLE CHEST - 1 VIEW COMPARISON:  10/19/2021 FINDINGS: New left perihilar airspace disease and some patchy left retrocardiac infiltrate. Right lung remains clear. Heart size and mediastinal contours are within normal limits. Aortic Atherosclerosis (ICD10-170.0). No effusion.  No pneumothorax. Visualized bones unremarkable. IMPRESSION: 1. New left perihilar airspace disease and patchy left retrocardiac opacities suggesting pneumonia. Electronically Signed   By: Lucrezia Europe M.D.   On: 11/02/2021 15:21     Subjective: Pt appears more comfortable at this time.    Discharge Exam: Vitals:   11/04/21 2300 10/22/2021 0200  BP:    Pulse: 88 95  Resp: 17 (!) 35  Temp:    SpO2: 91% 92%   Vitals:   11/04/21 2200 11/04/21 2300 10/27/2021 0200 11/07/2021 0500  BP:      Pulse: 88 88 95   Resp: 19 17 (!) 35   Temp:      TempSrc:      SpO2: 100% 91% 92%   Weight:    55.3 kg  Height:       General: Pt is somnolent, not in acute distress   The results of significant diagnostics from this hospitalization  (including imaging, microbiology, ancillary and laboratory) are listed below for reference.     Microbiology: Recent Results (from the past 240 hour(s))  Culture, blood (Routine x 2)     Status: None (Preliminary result)   Collection Time: 11/02/21  1:23 PM   Specimen: BLOOD RIGHT FOREARM  Result Value Ref Range Status   Specimen Description BLOOD RIGHT FOREARM  Final   Special Requests   Final    BOTTLES DRAWN AEROBIC AND ANAEROBIC Blood Culture adequate volume   Culture   Final    NO GROWTH 3 DAYS Performed at [facility], [address], [place], [place] [address]    Report Status PENDING  Incomplete  Culture, blood (Routine x 2)     Status: None (Preliminary result)   Collection Time: 11/02/21  1:24 PM   Specimen: BLOOD LEFT HAND  Result Value Ref Range Status   Specimen Description BLOOD LEFT HAND  Final   Special Requests   Final    BOTTLES DRAWN AEROBIC AND ANAEROBIC Blood Culture results may not be optimal due to an inadequate volume of blood received in culture bottles   Culture   Final    NO GROWTH 3 DAYS Performed at [facility], [address], [place], [place] [address]    Report Status PENDING  Incomplete  Resp Panel by RT-PCR (Flu A&B, Covid) Nasopharyngeal Swab     Status: None   Collection Time: 11/02/21  1:25 PM   Specimen: Nasopharyngeal Swab; Nasopharyngeal(NP) swabs in vial transport medium  Result Value Ref Range Status   SARS Coronavirus 2 by RT PCR NEGATIVE NEGATIVE Final    Comment: (NOTE)  SARS-CoV-2 target nucleic acids are NOT DETECTED.  The SARS-CoV-2 RNA is generally detectable in upper respiratory specimens during the acute phase of infection. The lowest concentration of SARS-CoV-2 viral copies this assay can detect is 138 copies/mL. A negative result does not preclude SARS-Cov-2 infection and should not be used as the sole basis for treatment or other patient management decisions. A negative result may occur with  improper specimen  collection/handling, submission of specimen other than nasopharyngeal swab, presence of viral mutation(s) within the areas targeted by this assay, and inadequate number of viral copies(<138 copies/mL). A negative result must be combined with clinical observations, patient history, and epidemiological information. The expected result is Negative.  Fact Sheet for Patients:  EntrepreneurPulse.com.au  Fact Sheet for Healthcare Providers:  IncredibleEmployment.be  This test is no t yet approved or cleared by the [place] FDA and  has been authorized for detection and/or diagnosis of SARS-CoV-2 by FDA under an Emergency Use Authorization (EUA). This EUA will remain  in effect (meaning this test can be used) for the duration of the COVID-19 declaration under Section 564(b)(1) of the Act, 21 U.S.C.section 360bbb-3(b)(1), unless the authorization is terminated  or revoked sooner.       Influenza A by PCR NEGATIVE NEGATIVE Final   Influenza B by PCR NEGATIVE NEGATIVE Final    Comment: (NOTE) The Xpert Xpress SARS-CoV-2/FLU/RSV plus assay is intended as an aid in the diagnosis of influenza from Nasopharyngeal swab specimens and should not be used as a sole basis for treatment. Nasal washings and aspirates are unacceptable for Xpert Xpress SARS-CoV-2/FLU/RSV testing.  Fact Sheet for Patients: EntrepreneurPulse.com.au  Fact Sheet for Healthcare Providers: IncredibleEmployment.be  This test is not yet approved or cleared by the [place] FDA and has been authorized for detection and/or diagnosis of SARS-CoV-2 by FDA under an Emergency Use Authorization (EUA). This EUA will remain in effect (meaning this test can be used) for the duration of the COVID-19 declaration under Section 564(b)(1) of the Act, 21 U.S.C. section 360bbb-3(b)(1), unless the authorization is terminated or revoked.  Performed at [facility], [address], [place], [place] [address]   Urine Culture     Status: Abnormal   Collection Time: 11/02/21  4:13 PM   Specimen: Urine, Catheterized  Result Value Ref Range Status   Specimen Description   Final    URINE, CATHETERIZED Performed at [facility], [address], [place], [place] [address]    Special Requests   Final    NONE Performed at [facility], [address], [place], [place] [address]    Culture MULTIPLE SPECIES PRESENT, SUGGEST RECOLLECTION (A)  Final   Report Status 11/03/2021 FINAL  Final  C Difficile Quick Screen w PCR reflex     Status: None   Collection Time: 11/02/21  4:14 PM   Specimen: STOOL  Result Value Ref Range Status   C Diff antigen NEGATIVE NEGATIVE Final   C Diff toxin NEGATIVE NEGATIVE Final   C Diff interpretation No C. difficile detected.  Final    Comment: Performed at [facility] [address], [place], [place] [address]  MRSA Next Gen by PCR, Nasal     Status: None   Collection Time: 11/02/21  7:50 PM   Specimen: Nasal Mucosa; Nasal Swab  Result Value Ref Range Status   MRSA by PCR Next Gen NOT DETECTED NOT DETECTED Final    Comment: (NOTE) The GeneXpert MRSA Assay (FDA approved for NASAL specimens only), is one component  of a comprehensive MRSA colonization surveillance program. It is not intended to diagnose MRSA infection nor to guide or monitor treatment for MRSA infections. Test performance is not FDA approved in patients less than 16 years old. Performed at [facility], [address], [place], [place] [address]      Labs: BNP (last 3 results) No results for input(s): BNP in the last 8760 hours. Basic Metabolic Panel: Recent Labs  Lab 11/02/21 1325 11/03/21 0421 11/03/21 0450 11/04/21 0357  NA 141 143  --  145  K 4.3 3.8  --  3.2*  CL 105 110  --  113*  CO2 24 22  --  23  GLUCOSE 117* 109*  --  98  BUN 70* 67*  --  71*  CREATININE 1.66* 1.33*  --  1.40*  CALCIUM 9.4 8.8*  --  8.7*  MG  --   --  2.5*  2.6*   Liver Function Tests: Recent Labs  Lab 11/02/21 1325 11/03/21 0421 11/04/21 0357  AST 113* 78* 107*  ALT 79* 65* 81*  ALKPHOS 121 91 92  BILITOT 1.1 0.3 0.8  PROT 6.9 5.4* 4.8*  ALBUMIN 2.8* 2.2* 2.0*   No results for input(s): LIPASE, AMYLASE in the last 168 hours. Recent Labs  Lab 11/02/21 1328  AMMONIA 23   CBC: Recent Labs  Lab 11/02/21 1325 11/03/21 0421 11/04/21 0357  WBC 18.6* 12.9* 6.9  NEUTROABS 16.9*  --   --   HGB 11.3* 8.6* 8.5*  HCT 36.2* 28.1* 28.2*  MCV 93.1 93.7 94.9  PLT 323 215 218   Cardiac Enzymes: Recent Labs  Lab 11/03/21 0450  CKTOTAL 37*   BNP: Invalid input(s): POCBNP CBG: Recent Labs  Lab 11/04/21 1122  GLUCAP 81   D-Dimer No results for input(s): DDIMER in the last 72 hours. Hgb A1c No results for input(s): HGBA1C in the last 72 hours. Lipid Profile No results for input(s): CHOL, HDL, LDLCALC, TRIG, CHOLHDL, LDLDIRECT in the last 72 hours. Thyroid function studies No results for input(s): TSH, T4TOTAL, T3FREE, THYROIDAB in the last 72 hours.  Invalid input(s): FREET3 Anemia work up No results for input(s): VITAMINB12, FOLATE, FERRITIN, TIBC, IRON, RETICCTPCT in the last 72 hours. Urinalysis    Component Value Date/Time   COLORURINE YELLOW 11/02/2021 1613   APPEARANCEUR HAZY (A) 11/02/2021 1613   LABSPEC 1.018 11/02/2021 1613   PHURINE 5.0 11/02/2021 1613   GLUCOSEU NEGATIVE 11/02/2021 1613   HGBUR SMALL (A) 11/02/2021 1613   BILIRUBINUR NEGATIVE 11/02/2021 1613   KETONESUR NEGATIVE 11/02/2021 1613   PROTEINUR 30 (A) 11/02/2021 1613   NITRITE NEGATIVE 11/02/2021 1613   LEUKOCYTESUR MODERATE (A) 11/02/2021 1613   Sepsis Labs Invalid input(s): PROCALCITONIN,  WBC,  LACTICIDVEN Microbiology Recent Results (from the past 240 hour(s))  Culture, blood (Routine x 2)     Status: None (Preliminary result)   Collection Time: 11/02/21  1:23 PM   Specimen: BLOOD RIGHT FOREARM  Result Value Ref Range Status    Specimen Description BLOOD RIGHT FOREARM  Final   Special Requests   Final    BOTTLES DRAWN AEROBIC AND ANAEROBIC Blood Culture adequate volume   Culture   Final    NO GROWTH 3 DAYS Performed at [facility], [address], [place], [place] [address]    Report Status PENDING  Incomplete  Culture, blood (Routine x 2)     Status: None (Preliminary result)   Collection Time: 11/02/21  1:24 PM   Specimen: BLOOD LEFT HAND  Result Value Ref Range Status   Specimen Description BLOOD LEFT HAND  Final   Special Requests   Final    BOTTLES DRAWN AEROBIC AND ANAEROBIC Blood Culture results may not be optimal due to an inadequate volume of blood received in culture bottles   Culture   Final    NO GROWTH 3 DAYS Performed at [facility], [address], [place], [place] [address]    Report Status PENDING  Incomplete  Resp Panel by RT-PCR (Flu A&B, Covid) Nasopharyngeal Swab     Status: None   Collection Time: 11/02/21  1:25 PM   Specimen: Nasopharyngeal Swab; Nasopharyngeal(NP) swabs in vial transport medium  Result Value Ref Range Status   SARS Coronavirus 2 by RT PCR NEGATIVE NEGATIVE Final    Comment: (NOTE) SARS-CoV-2 target nucleic acids are NOT DETECTED.  The SARS-CoV-2 RNA is generally detectable in upper respiratory specimens during the acute phase of infection. The lowest concentration of SARS-CoV-2 viral copies this assay can detect is 138 copies/mL. A negative result does not preclude SARS-Cov-2 infection and should not be used as the sole basis for treatment or other patient management decisions. A negative result may occur with  improper specimen collection/handling, submission of specimen other than nasopharyngeal swab, presence of viral mutation(s) within the areas targeted by this assay, and inadequate number of viral copies(<138 copies/mL). A negative result must be combined with clinical observations, patient history, and epidemiological information. The expected result is  Negative.  Fact Sheet for Patients:  EntrepreneurPulse.com.au  Fact Sheet for Healthcare Providers:  IncredibleEmployment.be  This test is no t yet approved or cleared by the [place] FDA and  has been authorized for detection and/or diagnosis of SARS-CoV-2 by FDA under an Emergency Use Authorization (EUA). This EUA will remain  in effect (meaning this test can be used) for the duration of the COVID-19 declaration under Section 564(b)(1) of the Act, 21 U.S.C.section 360bbb-3(b)(1), unless the authorization is terminated  or revoked sooner.       Influenza A by PCR NEGATIVE NEGATIVE Final   Influenza B by PCR NEGATIVE NEGATIVE Final    Comment: (NOTE) The Xpert Xpress SARS-CoV-2/FLU/RSV plus assay is intended as an aid in the diagnosis of influenza from Nasopharyngeal swab specimens and should not be used as a sole basis for treatment. Nasal washings and aspirates are unacceptable for Xpert Xpress SARS-CoV-2/FLU/RSV testing.  Fact Sheet for Patients: EntrepreneurPulse.com.au  Fact Sheet for Healthcare Providers: IncredibleEmployment.be  This test is not yet approved or cleared by the [place] FDA and has been authorized for detection and/or diagnosis of SARS-CoV-2 by FDA under an Emergency Use Authorization (EUA). This EUA will remain in effect (meaning this test can be used) for the duration of the COVID-19 declaration under Section 564(b)(1) of the Act, 21 U.S.C. section 360bbb-3(b)(1), unless the authorization is terminated or revoked.  Performed at [facility], [address] [place], [place] [address]   Urine Culture     Status: Abnormal   Collection Time: 11/02/21  4:13 PM   Specimen: Urine, Catheterized  Result Value Ref Range Status   Specimen Description   Final    URINE, CATHETERIZED Performed at [facility], [address], [place], [place] [address]    Special Requests    Final    NONE Performed at [facility], [address], [place], [place] [address]    Culture MULTIPLE SPECIES PRESENT, SUGGEST RECOLLECTION (A)  Final   Report Status 11/03/2021 FINAL  Final  C  Difficile Quick Screen w PCR reflex     Status: None   Collection Time: 11/02/21  4:14 PM   Specimen: STOOL  Result Value Ref Range Status   C Diff antigen NEGATIVE NEGATIVE Final   C Diff toxin NEGATIVE NEGATIVE Final   C Diff interpretation No C. difficile detected.  Final    Comment: Performed at [facility], [address] [place], [place] [address]  MRSA Next Gen by PCR, Nasal     Status: None   Collection Time: 11/02/21  7:50 PM   Specimen: Nasal Mucosa; Nasal Swab  Result Value Ref Range Status   MRSA by PCR Next Gen NOT DETECTED NOT DETECTED Final    Comment: (NOTE) The GeneXpert MRSA Assay (FDA approved for NASAL specimens only), is one component of a comprehensive MRSA colonization surveillance program. It is not intended to diagnose MRSA infection nor to guide or monitor treatment for MRSA infections. Test performance is not FDA approved in patients less than 42 years old. Performed at [facility], [address], [place], [place] [address]     Time coordinating discharge:   SIGNED:  Irwin Brakeman, MD  [facility] 10/24/2021, 3:55 PM How to contact the [facility] Attending or Consulting provider [place] or covering provider during after hours [place], for this patient?  Check the care team in [facility] and look for a) attending/consulting TRH provider listed and b) the [facility] team listed Log into [email or web address] and use [place]'s universal password to access. If you do not have the password, please contact the hospital operator. Locate the [facility] provider you are looking for under [facility] and page to a number that you can be directly reached. If you still have difficulty reaching the provider, please page the [facility] (Director on Call) for the Hospitalists listed on amion  for assistance.

## 2021-11-05 NOTE — Progress Notes (Signed)
Nutrition Brief Note  Chart reviewed. Pt now transitioning to comfort care.  No further nutrition interventions planned at this time.  Please re-consult as needed.   Lynn Brenon Antosh MS,RD,CSG,LDN Contact: AMION 

## 2021-11-05 NOTE — H&P (Signed)
History and Physical  [facility]  Paul Keith IHK:[id number] DOB: 30-Dec-1929 DOA: 10/19/2021  PCP: Wilburt Finlay, MD   Level of care: VIRTUAL HOSPICE (GIP)   I have personally briefly reviewed patient's old medical records in [place]  Chief Complaint: HOSPICE GIP   HPI:  87 year old male with a history of CKD stage III, NHL (lymphoblastic lymphoma) in remission, hyperlipidemia, myasthenia gravis, BPH, MGUS presenting from [facility] secondary to 3-day history of progressive generalized weakness, decreased oral intake, and pneumonia.  Notably, the patient was recently admitted to [facility] from 10/20/2019 to 10/22/2021 for altered mental status and frequent falls.  During his hospital stay there, the patient was confused and had hospital delirium.  He was found to have acute nondisplaced fractures of right L1 and L2 transverse processes as well as S3 and S4 vertebral bodies.  He had unchanged compression deformities of L4-L5.  He also has subacute appearing fractures of the right eighth  to 10th ribs and acute right 11th rib fracture.  During his hospital stay, he was noted to have an ammonia 57 and given lactulose.  His ammonia returned to normal.  At the time of his discharge to [facility], the patient was awake and alert and eating.   [facility] to [facility] the patient was living alone, but his son and daughter were taking turns staying with him on 24-hour basis.  The patient had been fairly independent and doing well from a functional standpoint until he began having frequent falls beginning in September 2022.  Daughter did relate that he did experience some mild cognitive decline in the last 8 to 12 months.   He presents on 11/02/2021 from [facility] secondary to 3 to 4 days of increasing shortness of breath, coughing and chest congestion.  Chest x-ray showed pneumonia.  The patient apparently was started on some type of antibiotic.  He continued to have chest  congestion and decreased oral intake and worsening generalized weakness.  As result, the patient was brought to the ER for further evaluation and treatment.  In the ED, the patient had low-grade temperature 100.3.  He was tachycardic.  He had soft blood pressures.  The patient had oxygen saturation in the mid 80s on room air.  He was placed on a nonrebreather initially.  WBC 18.6, hemoglobin 11.3, platelets 223,000.  CT chest showed diffuse GGO and hypodense consolidation opacities in the bilateral lower lobes with debris in the trachea.  CT of the abdomen and pelvis showed a chronic left upper abdomen fluid density collection.  CT of the brain showed mild chronic small vessel disease.  ABG pH 7.3 36/44/37/21 on 80%.  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 His smoking use included cigarettes. He has never used smokeless tobacco. He reports that he does not currently use alcohol. He reports that he does not use drugs.  No Known Allergies  No family history on file.  Prior to Admission medications  Not on File    Physical Exam:  General: Pt is somnolent, not in acute distress  Labs on Admission: I have personally reviewed following labs and imaging studies  CBC: Recent Labs  Lab 11/02/21 1325 11/03/21 0421 11/04/21 0357  WBC 18.6* 12.9* 6.9  NEUTROABS 16.9*  --   --   HGB 11.3* 8.6* 8.5*  HCT 36.2* 28.1* 28.2*  MCV 93.1 93.7 94.9  PLT 323 215 854   Basic Metabolic Panel: Recent Labs  Lab  11/02/21 1325 11/03/21 0421 11/03/21 0450 11/04/21 0357  NA 141 143  --  145  K 4.3 3.8  --  3.2*  CL 105 110  --  113*  CO2 24 22  --  23  GLUCOSE 117* 109*  --  98  BUN 70* 67*  --  71*  CREATININE 1.66* 1.33*  --  1.40*  CALCIUM 9.4 8.8*  --  8.7*  MG  --   --  2.5* 2.6*   GFR: Estimated Creatinine Clearance: 28 mL/min (A) (by C-G formula based on SCr of 1.4 mg/dL (H)). Liver Function Tests: Recent Labs  Lab 11/02/21 1325 11/03/21 0421 11/04/21 0357  AST 113* 78* 107*  ALT 79* 65* 81*  ALKPHOS 121 91 92  BILITOT 1.1 0.3 0.8  PROT 6.9 5.4* 4.8*  ALBUMIN 2.8* 2.2* 2.0*   No results for input(s): LIPASE, AMYLASE in the last 168 hours. Recent Labs  Lab 11/02/21 1328  AMMONIA 23   Coagulation Profile: Recent Labs  Lab 11/02/21 1325  INR 1.3*   Cardiac Enzymes: Recent Labs  Lab 11/03/21 0450  CKTOTAL 37*   BNP (last 3 results) No results for input(s): PROBNP in the last 8760 hours. HbA1C: No results for input(s): HGBA1C in the last 72 hours. CBG: Recent Labs  Lab 11/04/21 1122  GLUCAP 81   Lipid Profile: No results for input(s): CHOL, HDL, LDLCALC, TRIG, CHOLHDL, LDLDIRECT in the last 72 hours. Thyroid Function Tests: No results for input(s): TSH, T4TOTAL, FREET4, T3FREE, THYROIDAB in the last 72 hours. Anemia Panel: No results for input(s): VITAMINB12, FOLATE, FERRITIN, TIBC, IRON, RETICCTPCT in the last 72 hours. Urine analysis:    Component Value Date/Time   COLORURINE YELLOW 11/02/2021 1613   APPEARANCEUR HAZY (A) 11/02/2021 1613   LABSPEC 1.018 11/02/2021 1613   PHURINE 5.0 11/02/2021 1613   GLUCOSEU NEGATIVE 11/02/2021 1613   HGBUR SMALL (A) 11/02/2021 1613   BILIRUBINUR NEGATIVE 11/02/2021 1613   [place] 11/02/2021 1613   PROTEINUR 30 (A) 11/02/2021 1613   NITRITE NEGATIVE 11/02/2021 1613   LEUKOCYTESUR MODERATE (A) 11/02/2021 1613    Radiological Exams on Admission: No results found.  Assessment/Plan Principal  Problem:   Comfort measures only status Severe Sepsis Dysphagia  Severe protein calorie malnutrition Acute respiratory failure with hypoxia CKD stage IIIb Lumbar transverse process fractures and rib fractures BPH Myasthenia gravis Lymphoblastic lymphoma MGUS Transaminitis Pancreatic lesion  Patient has been admitted into virtual hospice GIP status and will receive full comfort measures.  Please see palliative care consultation notes and orders.  Level of care: HOSPICE GIP  Michel Eskelson MD [facility] How to contact the [facility] Attending or Consulting provider [place] or covering provider during after hours [place], for this patient?  Check the care team in [facility] and look for a) attending/consulting TRH provider listed and b) the [facility] team listed Log into [email or web address] and use [place]'s universal password to access. If you do not have the password, please contact  the hospital operator. Locate the [facility] provider you are looking for under [facility] and page to a number that you can be directly reached. If you still have difficulty reaching the provider, please page the [facility] (Director on Call) for the Hospitalists listed on amion for assistance.   If 7PM-7AM, please contact night-coverage [email or web address] Password [facility]  10/25/2021, 5:01 PM

## 2021-11-05 NOTE — TOC Progression Note (Addendum)
Transition of Care [facility]) - Progression Note    Patient Details  Name: Paul Keith MRN: [id number] Date of Birth: 11-03-1930  Transition of Care [facility]) CM/SW Contact  Shade Flood, LCSW Phone Number: 10/20/2021, 12:02 PM  Clinical Narrative:     TOC following. Pt family members have elected for referral to [facility] for residential hospice care. Referral given to [facility]. They do not currently have bed availability. An RN will be to the hospital between 1230 and 1pm to evaluate pt for GIP status. Updated MD and RN.  TOC will follow.  1544: Pt approved for GIP. Updated MD that orders could be entered to change status.  Expected Discharge Plan: [place] Barriers to Discharge: Hospice Bed not available  Expected Discharge Plan and Services Expected Discharge Plan: [place] In-house Referral: Clinical Social Work   Post Acute Care Choice: Hospice Living arrangements for the past 2 months: [place], [place]                                       Social Determinants of Health (SDOH) Interventions    Readmission Risk Interventions Readmission Risk Prevention Plan 11/03/2021  Transportation Screening Complete  HRI or [place] Complete  Social Work Consult for [place] Planning/Counseling Complete  Palliative Care Screening Not Applicable  Medication Review Press photographer) Complete  Some recent data might be hidden

## 2021-11-06 NOTE — Progress Notes (Signed)
PROGRESS NOTE  Paul Keith CVE:[id number] DOB: 1930/03/25 DOA: 11/14/2021 PCP: Wilburt Finlay, MD  Brief Presentation History:  87 year old male with a history of CKD stage III, NHL (lymphoblastic lymphoma) in remission, hyperlipidemia, myasthenia gravis, BPH, MGUS presenting from [facility] secondary to 3-day history of progressive generalized weakness, decreased oral intake, and pneumonia.  Notably, the patient was recently admitted to [facility] from 10/20/2019 to 10/22/2021 for altered mental status and frequent falls.  During his hospital stay there, the patient was confused and had hospital delirium.  He was found to have acute nondisplaced fractures of right L1 and L2 transverse processes as well as S3 and S4 vertebral bodies.  He had unchanged compression deformities of L4-L5.  He also has subacute appearing fractures of the right eighth  to 10th ribs and acute right 11th rib fracture.  During his hospital stay, he was noted to have an ammonia 57 and given lactulose.  His ammonia returned to normal.  At the time of his discharge to [facility], the patient was awake and alert and eating.  Prior to his admission to [facility] the patient was living alone, but his son and daughter were taking turns staying with him on 24-hour basis.  The patient had been fairly independent and doing well from a functional standpoint until he began having frequent falls beginning in September 2022.  Daughter did relate that he did experience some mild cognitive decline in the last 8 to 12 months.  He presents on 11/02/2021 from [facility] secondary to 3 to 4 days of increasing shortness of breath, coughing and chest congestion.  Chest x-ray showed pneumonia.  The patient apparently was started on some type of antibiotic.  He continued to have chest congestion and decreased oral intake and worsening generalized weakness.  As result, the patient was brought to the ER for further evaluation and treatment.  In the  ED, the patient had low-grade temperature 100.3.  He was tachycardic.  He had soft blood pressures.  The patient had oxygen saturation in the mid 80s on room air.  He was placed on a nonrebreather initially.  WBC 18.6, hemoglobin 11.3, platelets 223,000.  CT chest showed diffuse GGO and hypodense consolidation opacities in the bilateral lower lobes with debris in the trachea.  CT of the abdomen and pelvis showed a chronic left upper abdomen fluid density collection.  CT of the brain showed mild chronic small vessel disease.  ABG pH 7.3 36/44/37/21 on 80%.  After palliative discussions patient was made full comfort care and GIP status.    Assessment/Plan: Severe Sepsis Dysphagia  Severe protein calorie malnutrition Acute respiratory failure with hypoxia CKD stage IIIb Lumbar transverse process fractures and rib fractures BPH Myasthenia gravis Lymphoblastic lymphoma MGUS Transaminitis Pancreatic lesion   Patient has been admitted into virtual hospice GIP status and will receive full comfort measures.  Please see palliative care consultation notes and orders.   Status is: GIP virtual inpatient hospice   Family Communication:   daughter updated 12/20, 12/21, 12/22  Consultants:  palliative care   Code Status:  DNR--confirmed with daughter  Procedures: As Listed in Progress Note Above  Antibiotics: Zosyn 12/20>> Azithro 12/29>>  Subjective: Patient unresponsive today  Objective: Vitals:   10/26/2021 2008 11/06/21 0944  BP:  (!) 142/91  Pulse: (!) 110 (!) 106  Resp:  18  Temp:  98.7 F (37.1 C)  TempSrc:  Axillary  SpO2:  91%  Intake/Output Summary (Last 24 hours) at 11/06/2021 1610 Last data filed at 11/06/2021 0900 Gross per 24 hour  Intake 0 ml  Output 650 ml  Net -650 ml   Weight change:  Exam:  General:  frail, emaciated, Pt is alert, does not follows command appropriately, not in acute distress  Data Reviewed: I have personally reviewed following labs and  imaging studies Basic Metabolic Panel: Recent Labs  Lab 11/02/21 1325 11/03/21 0421 11/03/21 0450 11/04/21 0357  NA 141 143  --  145  K 4.3 3.8  --  3.2*  CL 105 110  --  113*  CO2 24 22  --  23  GLUCOSE 117* 109*  --  98  BUN 70* 67*  --  71*  CREATININE 1.66* 1.33*  --  1.40*  CALCIUM 9.4 8.8*  --  8.7*  MG  --   --  2.5* 2.6*   Liver Function Tests: Recent Labs  Lab 11/02/21 1325 11/03/21 0421 11/04/21 0357  AST 113* 78* 107*  ALT 79* 65* 81*  ALKPHOS 121 91 92  BILITOT 1.1 0.3 0.8  PROT 6.9 5.4* 4.8*  ALBUMIN 2.8* 2.2* 2.0*   No results for input(s): LIPASE, AMYLASE in the last 168 hours. Recent Labs  Lab 11/02/21 1328  AMMONIA 23   Coagulation Profile: Recent Labs  Lab 11/02/21 1325  INR 1.3*   CBC: Recent Labs  Lab 11/02/21 1325 11/03/21 0421 11/04/21 0357  WBC 18.6* 12.9* 6.9  NEUTROABS 16.9*  --   --   HGB 11.3* 8.6* 8.5*  HCT 36.2* 28.1* 28.2*  MCV 93.1 93.7 94.9  PLT 323 215 218   Cardiac Enzymes: Recent Labs  Lab 11/03/21 0450  CKTOTAL 37*   BNP: Invalid input(s): POCBNP CBG: Recent Labs  Lab 11/04/21 1122  GLUCAP 81   HbA1C: No results for input(s): HGBA1C in the last 72 hours. Urine analysis:    Component Value Date/Time   COLORURINE YELLOW 11/02/2021 1613   APPEARANCEUR HAZY (A) 11/02/2021 1613   LABSPEC 1.018 11/02/2021 1613   PHURINE 5.0 11/02/2021 1613   GLUCOSEU NEGATIVE 11/02/2021 1613   HGBUR SMALL (A) 11/02/2021 1613   BILIRUBINUR NEGATIVE 11/02/2021 1613   [place] 11/02/2021 1613   PROTEINUR 30 (A) 11/02/2021 1613   NITRITE NEGATIVE 11/02/2021 1613   LEUKOCYTESUR MODERATE (A) 11/02/2021 1613   Recent Results (from the past 240 hour(s))  Culture, blood (Routine x 2)     Status: None (Preliminary result)   Collection Time: 11/02/21  1:23 PM   Specimen: BLOOD RIGHT FOREARM  Result Value Ref Range Status   Specimen Description BLOOD RIGHT FOREARM  Final   Special Requests   Final    BOTTLES  DRAWN AEROBIC AND ANAEROBIC Blood Culture adequate volume   Culture   Final    NO GROWTH 4 DAYS Performed at [facility], [address], [place], [place] [address]    Report Status PENDING  Incomplete  Culture, blood (Routine x 2)     Status: None (Preliminary result)   Collection Time: 11/02/21  1:24 PM   Specimen: BLOOD LEFT HAND  Result Value Ref Range Status   Specimen Description BLOOD LEFT HAND  Final   Special Requests   Final    BOTTLES DRAWN AEROBIC AND ANAEROBIC Blood Culture results may not be optimal due to an inadequate volume of blood received in culture bottles   Culture   Final    NO GROWTH 4 DAYS Performed at [facility],  [place], [place] [address]    Report Status PENDING  Incomplete  Resp Panel by RT-PCR (Flu A&B, Covid) Nasopharyngeal Swab     Status: None   Collection Time: 11/02/21  1:25 PM   Specimen: Nasopharyngeal Swab; Nasopharyngeal(NP) swabs in vial transport medium  Result Value Ref Range Status   SARS Coronavirus 2 by RT PCR NEGATIVE NEGATIVE Final    Comment: (NOTE) SARS-CoV-2 target nucleic acids are NOT DETECTED.  The SARS-CoV-2 RNA is generally detectable in upper respiratory specimens during the acute phase of infection. The lowest concentration of SARS-CoV-2 viral copies this assay can detect is 138 copies/mL. A negative result does not preclude SARS-Cov-2 infection and should not be used as the sole basis for treatment or other patient management decisions. A negative result may occur with  improper specimen collection/handling, submission of specimen other than nasopharyngeal swab, presence of viral mutation(s) within the areas targeted by this assay, and inadequate number of viral copies(<138 copies/mL). A negative result must be combined with clinical observations, patient history, and epidemiological information. The expected result is Negative.  Fact Sheet for Patients:   EntrepreneurPulse.com.au  Fact Sheet for Healthcare Providers:  IncredibleEmployment.be  This test is no t yet approved or cleared by the [place] FDA and  has been authorized for detection and/or diagnosis of SARS-CoV-2 by FDA under an Emergency Use Authorization (EUA). This EUA will remain  in effect (meaning this test can be used) for the duration of the COVID-19 declaration under Section 564(b)(1) of the Act, 21 U.S.C.section 360bbb-3(b)(1), unless the authorization is terminated  or revoked sooner.       Influenza A by PCR NEGATIVE NEGATIVE Final   Influenza B by PCR NEGATIVE NEGATIVE Final    Comment: (NOTE) The Xpert Xpress SARS-CoV-2/FLU/RSV plus assay is intended as an aid in the diagnosis of influenza from Nasopharyngeal swab specimens and should not be used as a sole basis for treatment. Nasal washings and aspirates are unacceptable for Xpert Xpress SARS-CoV-2/FLU/RSV testing.  Fact Sheet for Patients: EntrepreneurPulse.com.au  Fact Sheet for Healthcare Providers: IncredibleEmployment.be  This test is not yet approved or cleared by the [place] FDA and has been authorized for detection and/or diagnosis of SARS-CoV-2 by FDA under an Emergency Use Authorization (EUA). This EUA will remain in effect (meaning this test can be used) for the duration of the COVID-19 declaration under Section 564(b)(1) of the Act, 21 U.S.C. section 360bbb-3(b)(1), unless the authorization is terminated or revoked.  Performed at [facility], [address] [place], [place] [address]   Urine Culture     Status: Abnormal   Collection Time: 11/02/21  4:13 PM   Specimen: Urine, Catheterized  Result Value Ref Range Status   Specimen Description   Final    URINE, CATHETERIZED Performed at [facility], [address] [place], [place] [address]    Special Requests   Final    NONE Performed at [facility], [address], [place], [place] [address]    Culture MULTIPLE SPECIES PRESENT, SUGGEST RECOLLECTION (A)  Final   Report Status 11/03/2021 FINAL  Final  C Difficile Quick Screen w PCR reflex     Status: None   Collection Time: 11/02/21  4:14 PM   Specimen: STOOL  Result Value Ref Range Status   C Diff antigen NEGATIVE NEGATIVE Final   C Diff toxin NEGATIVE NEGATIVE Final   C Diff interpretation No C. difficile detected.  Final    Comment: Performed at [facility]  [address] [place], [place] [address]  MRSA Next Gen by PCR, Nasal     Status: None   Collection Time: 11/02/21  7:50 PM   Specimen: Nasal Mucosa; Nasal Swab  Result Value Ref Range Status   MRSA by PCR Next Gen NOT DETECTED NOT DETECTED Final    Comment: (NOTE) The GeneXpert MRSA Assay (FDA approved for NASAL specimens only), is one component of a comprehensive MRSA colonization surveillance program. It is not intended to diagnose MRSA infection nor to guide or monitor treatment for MRSA infections. Test performance is not FDA approved in patients less than 27 years old. Performed at [facility], [address], [place], [place] [address]      Scheduled Meds:   HYDROmorphone (DILAUDID) injection  1 mg Intravenous Q4H   Continuous Infusions:   Procedures/Studies: CT Head Wo Contrast  Result Date: 11/02/2021 CLINICAL DATA:  Sepsis.  Fall.  Head trauma. EXAM: CT HEAD WITHOUT CONTRAST TECHNIQUE: Contiguous axial images were obtained from the base of the skull through the vertex without intravenous contrast. COMPARISON:  10/19/2021 FINDINGS: Brain: Generalized brain atrophy. Mild chronic small-vessel ischemic change of the white matter. No sign of acute infarction, mass lesion, hemorrhage, hydrocephalus or extra-axial collection. Vascular: There is atherosclerotic calcification of the major vessels at the base of the brain. Skull: Negative Sinuses/Orbits: Clear/normal Other: None IMPRESSION: No acute or traumatic  finding. Age related atrophy. Mild chronic small-vessel ischemic change of the white matter. Electronically Signed   By: Nelson Chimes M.D.   On: 11/02/2021 16:26   CT CHEST ABDOMEN PELVIS W CONTRAST  Result Date: 11/02/2021 CLINICAL DATA:  Polytrauma, blunt fall, hypoxia, sepsis EXAM: CT CHEST, ABDOMEN, AND PELVIS WITH CONTRAST TECHNIQUE: Multidetector CT imaging of the chest, abdomen and pelvis was performed following the standard protocol during bolus administration of intravenous contrast. CONTRAST:  56mL OMNIPAQUE IOHEXOL 300 MG/ML  SOLN COMPARISON:  CT chest, abdomen, pelvis 10/19/2021. CT chest abdomen pelvis 09/21/2021 FINDINGS: CHEST: Ports and Devices: None. Lungs/airways: Diffuse peribronchovascular ground-glass and hypodense heterogeneous consolidative airspace opacities that are most prominent within the bilateral lower lobes. Limited evaluation for pulmonary nodule due to motion artifact and consolidation. No pulmonary mass. No pulmonary contusion or laceration. No pneumatocele formation. Debris within the trachea. Otherwise the central airways are patent. Pleura: No pleural effusion. No pneumothorax. No hemothorax. Lymph Nodes: Enlarged mediastinal lymph nodes: 1.3 cm aortopulmonary window. No hilar or axillary lymphadenopathy. Mediastinum: No pneumomediastinum. No aortic injury or mediastinal hematoma. The thoracic aorta is normal in caliber. At least moderate atherosclerotic plaque. Coronary artery calcifications. The heart is normal in size. No significant pericardial effusion. The main pulmonary artery is normal in caliber. No central pulmonary embolus. The esophagus is unremarkable. The thyroid is unremarkable. Chest Wall / Breasts: No chest wall mass. Musculoskeletal: Diffusely decreased bone density. No acute rib or sternal fracture; however, markedly limited evaluation due to respiratory motion artifact. Known subacute rib fractures are again noted and are poorly evaluation due to motion  artifact. No acute spinal fracture. ABDOMEN / PELVIS: Liver: Not enlarged. No focal lesion. No laceration or subcapsular hematoma. Biliary System: The gallbladder is otherwise unremarkable with no radio-opaque gallstones. No biliary ductal dilatation. Pancreas: Atrophic. There is a 1.2 x 1 cm hypodense lesion within the proximal pancreatic body (2:72). No main pancreatic duct dilatation. Spleen: Not enlarged. No focal lesion. No laceration, subcapsular hematoma, or vascular injury. Adrenal Glands: No nodularity bilaterally. Kidneys: Bilateral kidneys enhance symmetrically. No hydronephrosis. No contusion, laceration, or subcapsular  hematoma. No injury to the vascular structures or collecting systems. No hydroureter. The urinary bladder is unremarkable. Bowel: No small or large bowel wall thickening or dilatation. Scattered colonic diverticulosis. The appendix is unremarkable. Mesentery, Omentum, and Peritoneum: Redemonstration of a grossly similar-appearing 3.6 x 3.6 x 5.2 Cm fluid density lesion within the left upper abdomen. No associated peripheral enhancement. No simple free fluid ascites. No pneumoperitoneum. No hemoperitoneum. No mesenteric hematoma identified. No organized fluid collection. Pelvic Organs: Normal. Lymph Nodes: No abdominal, pelvic, inguinal lymphadenopathy. Vasculature: The main portal, splenic, superior mesenteric veins are patent. Atherosclerotic plaque. No abdominal aorta or iliac aneurysm. No active contrast extravasation or pseudoaneurysm. Musculoskeletal: No significant soft tissue hematoma. Diffusely decreased bone density. No acute pelvic fracture. Chronic stable L4-L5 compression fractures. No acute spinal fracture. Redemonstration of subacute right L1-L2 transverse process fractures. Redemonstration of subacute S3 and S4 vertebral body fractures. IMPRESSION: 1. Pulmonary findings consistent with infection/inflammation. Associated debris within the trachea. Followup PA and lateral  chest X-ray is recommended in 3-4 weeks following therapy to ensure resolution and exclude underlying malignancy. 2. Mediastinal lymphadenopathy likely reactive in etiology. Attention on follow-up. 3. No acute traumatic injury to the chest, abdomen, or pelvis. 4. No acute fracture or traumatic malalignment of the thoracic or lumbar spine.Redemonstration of subacute right L1-L2 transverse process as well as S3 and S4 fractures. These were acute on 10/19/2021. 5. Markedly limited evaluation of the ribs for acute fracture due to respiratory motion artifact. Known subacute rib fractures are again noted but poorly evaluated due to motion artifact. Other imaging findings of potential clinical significance: 1. Grossly similar-appearing 3.6 x 3.6 x 5.2cm fluid density lesion within the left upper abdomen. 2. Slightly more prominent due to administration of intravenous contrast, indeterminate 1.2 x 1 cm hypodense lesion within the proximal pancreatic body-consider nonemergent MRI pancreatic protocol. When the patient is clinically stable and able to follow directions and hold their breath (preferably as an outpatient) further evaluation with dedicated abdominal MRI should be considered. 3. Aortic Atherosclerosis (ICD10-I70.0) including coronary artery calcifications. 4. Diffusely decreased bone density. Electronically Signed   By: Iven Finn M.D.   On: 11/02/2021 16:46   DG Chest Portable 1 View  Result Date: 11/02/2021 CLINICAL DATA:  Pt diagnosed with pneumonia at [facility] on 11/01/2021, Cough, sob, congestion Neg covid 09/11/21 EXAM: PORTABLE CHEST - 1 VIEW COMPARISON:  10/19/2021 FINDINGS: New left perihilar airspace disease and some patchy left retrocardiac infiltrate. Right lung remains clear. Heart size and mediastinal contours are within normal limits. Aortic Atherosclerosis (ICD10-170.0). No effusion.  No pneumothorax. Visualized bones unremarkable. IMPRESSION: 1. New left perihilar airspace disease and patchy  left retrocardiac opacities suggesting pneumonia. Electronically Signed   By: Lucrezia Europe M.D.   On: 11/02/2021 15:21    Nyrie Sigal Wynetta Emery, MD How to contact the [facility] Attending or Consulting provider [place] or covering provider during after hours [place], for this patient?  Check the care team in [facility] and look for a) attending/consulting [facility] provider listed and b) the [facility] team listed Log into [email or web address] and use [place]'s universal password to access. If you do not have the password, please contact the hospital operator. Locate the [facility] provider you are looking for under [facility] and page to a number that you can be directly reached. If you still have difficulty reaching the provider, please page the [facility] (Director on Call) for the Hospitalists listed on amion for assistance.   [facility]  If 7PM-7AM, please contact night-coverage  [email or web address] Password [email or web address] 11/06/2021, 4:10 PM   LOS: 1 day

## 2021-11-07 LAB — CULTURE, BLOOD (ROUTINE X 2)
Culture: NO GROWTH
Culture: NO GROWTH
Special Requests: ADEQUATE

## 2021-11-07 NOTE — Progress Notes (Signed)
PROGRESS NOTE  Paul Keith LPF:[id number] DOB: 1930/07/14 DOA: 11/04/2021 PCP: Wilburt Finlay, MD  Brief Presentation History:  87 year old male with a history of CKD stage III, NHL (lymphoblastic lymphoma) in remission, hyperlipidemia, myasthenia gravis, BPH, MGUS presenting from [facility] secondary to 3-day history of progressive generalized weakness, decreased oral intake, and pneumonia.  Notably, the patient was recently admitted to [facility] from 10/20/2019 to 10/22/2021 for altered mental status and frequent falls.  During his hospital stay there, the patient was confused and had hospital delirium.  He was found to have acute nondisplaced fractures of right L1 and L2 transverse processes as well as S3 and S4 vertebral bodies.  He had unchanged compression deformities of L4-L5.  He also has subacute appearing fractures of the right eighth  to 10th ribs and acute right 11th rib fracture.  During his hospital stay, he was noted to have an ammonia 57 and given lactulose.  His ammonia returned to normal.  At the time of his discharge to [facility], the patient was awake and alert and eating.  Prior to his admission to [facility] the patient was living alone, but his son and daughter were taking turns staying with him on 24-hour basis.  The patient had been fairly independent and doing well from a functional standpoint until he began having frequent falls beginning in September 2022.  Daughter did relate that he did experience some mild cognitive decline in the last 8 to 12 months.  He presents on 11/02/2021 from [facility] secondary to 3 to 4 days of increasing shortness of breath, coughing and chest congestion.  Chest x-ray showed pneumonia.  The patient apparently was started on some type of antibiotic.  He continued to have chest congestion and decreased oral intake and worsening generalized weakness.  As result, the patient was brought to the ER for further evaluation and treatment.  In the  ED, the patient had low-grade temperature 100.3.  He was tachycardic.  He had soft blood pressures.  The patient had oxygen saturation in the mid 80s on room air.  He was placed on a nonrebreather initially.  WBC 18.6, hemoglobin 11.3, platelets 223,000.  CT chest showed diffuse GGO and hypodense consolidation opacities in the bilateral lower lobes with debris in the trachea.  CT of the abdomen and pelvis showed a chronic left upper abdomen fluid density collection.  CT of the brain showed mild chronic small vessel disease.  ABG pH 7.3 36/44/37/21 on 80%.  After palliative discussions patient was made full comfort care and GIP status.    Assessment/Plan: Severe Sepsis Dysphagia  Severe protein calorie malnutrition Acute respiratory failure with hypoxia CKD stage IIIb Lumbar transverse process fractures and rib fractures BPH Myasthenia gravis Lymphoblastic lymphoma MGUS Transaminitis Pancreatic lesion   Patient has been admitted into virtual hospice GIP status and will receive full comfort measures.  Please see palliative care consultation notes and orders.   Status is: GIP virtual inpatient hospice   Family Communication:   daughter updated 12/20, 12/21, 12/22  Consultants:  palliative care   Code Status:  DNR--confirmed with daughter  Procedures: As Listed in Progress Note Above  Antibiotics: Zosyn 12/20>> Azithro 12/29>>  Subjective: Patient unresponsive but appears comfortable   Objective: Vitals:   10/30/2021 2008 11/06/21 0944 11/06/21 2051 11/07/21 0505  BP:  (!) 142/91 122/71 126/74  Pulse: (!) 110 (!) 106 (!) 107 (!) 103  Resp:  18 16 16   Temp:  98.7 F (37.1 C) 97.7 F (36.5 C) 98.2 F (36.8 C)  TempSrc:  Axillary Oral Oral  SpO2:  91%      Intake/Output Summary (Last 24 hours) at 11/07/2021 1128 Last data filed at 11/07/2021 0302 Gross per 24 hour  Intake --  Output 400 ml  Net -400 ml   Weight change:  Exam:  General:  frail, emaciated, Pt is  alert, does not follows command appropriately, not in acute distress  Data Reviewed: I have personally reviewed following labs and imaging studies Basic Metabolic Panel: Recent Labs  Lab 11/02/21 1325 11/03/21 0421 11/03/21 0450 11/04/21 0357  NA 141 143  --  145  K 4.3 3.8  --  3.2*  CL 105 110  --  113*  CO2 24 22  --  23  GLUCOSE 117* 109*  --  98  BUN 70* 67*  --  71*  CREATININE 1.66* 1.33*  --  1.40*  CALCIUM 9.4 8.8*  --  8.7*  MG  --   --  2.5* 2.6*   Liver Function Tests: Recent Labs  Lab 11/02/21 1325 11/03/21 0421 11/04/21 0357  AST 113* 78* 107*  ALT 79* 65* 81*  ALKPHOS 121 91 92  BILITOT 1.1 0.3 0.8  PROT 6.9 5.4* 4.8*  ALBUMIN 2.8* 2.2* 2.0*   No results for input(s): LIPASE, AMYLASE in the last 168 hours. Recent Labs  Lab 11/02/21 1328  AMMONIA 23   Coagulation Profile: Recent Labs  Lab 11/02/21 1325  INR 1.3*   CBC: Recent Labs  Lab 11/02/21 1325 11/03/21 0421 11/04/21 0357  WBC 18.6* 12.9* 6.9  NEUTROABS 16.9*  --   --   HGB 11.3* 8.6* 8.5*  HCT 36.2* 28.1* 28.2*  MCV 93.1 93.7 94.9  PLT 323 215 218   Cardiac Enzymes: Recent Labs  Lab 11/03/21 0450  CKTOTAL 37*   BNP: Invalid input(s): POCBNP CBG: Recent Labs  Lab 11/04/21 1122  GLUCAP 81   HbA1C: No results for input(s): HGBA1C in the last 72 hours. Urine analysis:    Component Value Date/Time   COLORURINE YELLOW 11/02/2021 1613   APPEARANCEUR HAZY (A) 11/02/2021 1613   LABSPEC 1.018 11/02/2021 1613   PHURINE 5.0 11/02/2021 1613   GLUCOSEU NEGATIVE 11/02/2021 1613   HGBUR SMALL (A) 11/02/2021 1613   BILIRUBINUR NEGATIVE 11/02/2021 1613   KETONESUR NEGATIVE 11/02/2021 1613   PROTEINUR 30 (A) 11/02/2021 1613   NITRITE NEGATIVE 11/02/2021 1613   LEUKOCYTESUR MODERATE (A) 11/02/2021 1613   Recent Results (from the past 240 hour(s))  Culture, blood (Routine x 2)     Status: None   Collection Time: 11/02/21  1:23 PM   Specimen: BLOOD RIGHT FOREARM  Result Value  Ref Range Status   Specimen Description BLOOD RIGHT FOREARM  Final   Special Requests   Final    BOTTLES DRAWN AEROBIC AND ANAEROBIC Blood Culture adequate volume   Culture   Final    NO GROWTH 5 DAYS Performed at [facility], [address], [place], [place] [address]    Report Status 11/07/2021 FINAL  Final  Culture, blood (Routine x 2)     Status: None   Collection Time: 11/02/21  1:24 PM   Specimen: BLOOD LEFT HAND  Result Value Ref Range Status   Specimen Description BLOOD LEFT HAND  Final   Special Requests   Final    BOTTLES DRAWN AEROBIC AND ANAEROBIC Blood Culture results may not be optimal due to an inadequate volume of  blood received in culture bottles   Culture   Final    NO GROWTH 5 DAYS Performed at [facility], [address] [place], [place] [address]    Report Status 11/07/2021 FINAL  Final  Resp Panel by RT-PCR (Flu A&B, Covid) Nasopharyngeal Swab     Status: None   Collection Time: 11/02/21  1:25 PM   Specimen: Nasopharyngeal Swab; Nasopharyngeal(NP) swabs in vial transport medium  Result Value Ref Range Status   SARS Coronavirus 2 by RT PCR NEGATIVE NEGATIVE Final    Comment: (NOTE) SARS-CoV-2 target nucleic acids are NOT DETECTED.  The SARS-CoV-2 RNA is generally detectable in upper respiratory specimens during the acute phase of infection. The lowest concentration of SARS-CoV-2 viral copies this assay can detect is 138 copies/mL. A negative result does not preclude SARS-Cov-2 infection and should not be used as the sole basis for treatment or other patient management decisions. A negative result may occur with  improper specimen collection/handling, submission of specimen other than nasopharyngeal swab, presence of viral mutation(s) within the areas targeted by this assay, and inadequate number of viral copies(<138 copies/mL). A negative result must be combined with clinical observations, patient history, and epidemiological information. The expected  result is Negative.  Fact Sheet for Patients:  EntrepreneurPulse.com.au  Fact Sheet for Healthcare Providers:  IncredibleEmployment.be  This test is no t yet approved or cleared by the [place] FDA and  has been authorized for detection and/or diagnosis of SARS-CoV-2 by FDA under an Emergency Use Authorization (EUA). This EUA will remain  in effect (meaning this test can be used) for the duration of the COVID-19 declaration under Section 564(b)(1) of the Act, 21 U.S.C.section 360bbb-3(b)(1), unless the authorization is terminated  or revoked sooner.       Influenza A by PCR NEGATIVE NEGATIVE Final   Influenza B by PCR NEGATIVE NEGATIVE Final    Comment: (NOTE) The Xpert Xpress SARS-CoV-2/FLU/RSV plus assay is intended as an aid in the diagnosis of influenza from Nasopharyngeal swab specimens and should not be used as a sole basis for treatment. Nasal washings and aspirates are unacceptable for Xpert Xpress SARS-CoV-2/FLU/RSV testing.  Fact Sheet for Patients: EntrepreneurPulse.com.au  Fact Sheet for Healthcare Providers: IncredibleEmployment.be  This test is not yet approved or cleared by the [place] FDA and has been authorized for detection and/or diagnosis of SARS-CoV-2 by FDA under an Emergency Use Authorization (EUA). This EUA will remain in effect (meaning this test can be used) for the duration of the COVID-19 declaration under Section 564(b)(1) of the Act, 21 U.S.C. section 360bbb-3(b)(1), unless the authorization is terminated or revoked.  Performed at [facility], [address], [place], [place] [address]   Urine Culture     Status: Abnormal   Collection Time: 11/02/21  4:13 PM   Specimen: Urine, Catheterized  Result Value Ref Range Status   Specimen Description   Final    URINE, CATHETERIZED Performed at [facility], [address] [place], [place] [address]    Special  Requests   Final    NONE Performed at [facility], [address], [place], [place] [address]    Culture MULTIPLE SPECIES PRESENT, SUGGEST RECOLLECTION (A)  Final   Report Status 11/03/2021 FINAL  Final  C Difficile Quick Screen w PCR reflex     Status: None   Collection Time: 11/02/21  4:14 PM   Specimen: STOOL  Result Value Ref Range Status   C Diff antigen NEGATIVE NEGATIVE Final   C  Diff toxin NEGATIVE NEGATIVE Final   C Diff interpretation No C. difficile detected.  Final    Comment: Performed at [facility], [address] [place], [place] [address]  MRSA Next Gen by PCR, Nasal     Status: None   Collection Time: 11/02/21  7:50 PM   Specimen: Nasal Mucosa; Nasal Swab  Result Value Ref Range Status   MRSA by PCR Next Gen NOT DETECTED NOT DETECTED Final    Comment: (NOTE) The GeneXpert MRSA Assay (FDA approved for NASAL specimens only), is one component of a comprehensive MRSA colonization surveillance program. It is not intended to diagnose MRSA infection nor to guide or monitor treatment for MRSA infections. Test performance is not FDA approved in patients less than 70 years old. Performed at [facility], [address] [place], [place] [address]      Scheduled Meds:   HYDROmorphone (DILAUDID) injection  1 mg Intravenous Q4H   Continuous Infusions:   Procedures/Studies: CT Head Wo Contrast  Result Date: 11/02/2021 CLINICAL DATA:  Sepsis.  Fall.  Head trauma. EXAM: CT HEAD WITHOUT CONTRAST TECHNIQUE: Contiguous axial images were obtained from the base of the skull through the vertex without intravenous contrast. COMPARISON:  10/19/2021 FINDINGS: Brain: Generalized brain atrophy. Mild chronic small-vessel ischemic change of the white matter. No sign of acute infarction, mass lesion, hemorrhage, hydrocephalus or extra-axial collection. Vascular: There is atherosclerotic calcification of the major vessels at the base of the brain. Skull: Negative Sinuses/Orbits:  Clear/normal Other: None IMPRESSION: No acute or traumatic finding. Age related atrophy. Mild chronic small-vessel ischemic change of the white matter. Electronically Signed   By: Nelson Chimes M.D.   On: 11/02/2021 16:26   CT CHEST ABDOMEN PELVIS W CONTRAST  Result Date: 11/02/2021 CLINICAL DATA:  Polytrauma, blunt fall, hypoxia, sepsis EXAM: CT CHEST, ABDOMEN, AND PELVIS WITH CONTRAST TECHNIQUE: Multidetector CT imaging of the chest, abdomen and pelvis was performed following the standard protocol during bolus administration of intravenous contrast. CONTRAST:  24mL OMNIPAQUE IOHEXOL 300 MG/ML  SOLN COMPARISON:  CT chest, abdomen, pelvis 10/19/2021. CT chest abdomen pelvis 09/21/2021 FINDINGS: CHEST: Ports and Devices: None. Lungs/airways: Diffuse peribronchovascular ground-glass and hypodense heterogeneous consolidative airspace opacities that are most prominent within the bilateral lower lobes. Limited evaluation for pulmonary nodule due to motion artifact and consolidation. No pulmonary mass. No pulmonary contusion or laceration. No pneumatocele formation. Debris within the trachea. Otherwise the central airways are patent. Pleura: No pleural effusion. No pneumothorax. No hemothorax. Lymph Nodes: Enlarged mediastinal lymph nodes: 1.3 cm aortopulmonary window. No hilar or axillary lymphadenopathy. Mediastinum: No pneumomediastinum. No aortic injury or mediastinal hematoma. The thoracic aorta is normal in caliber. At least moderate atherosclerotic plaque. Coronary artery calcifications. The heart is normal in size. No significant pericardial effusion. The main pulmonary artery is normal in caliber. No central pulmonary embolus. The esophagus is unremarkable. The thyroid is unremarkable. Chest Wall / Breasts: No chest wall mass. Musculoskeletal: Diffusely decreased bone density. No acute rib or sternal fracture; however, markedly limited evaluation due to respiratory motion artifact. Known subacute rib  fractures are again noted and are poorly evaluation due to motion artifact. No acute spinal fracture. ABDOMEN / PELVIS: Liver: Not enlarged. No focal lesion. No laceration or subcapsular hematoma. Biliary System: The gallbladder is otherwise unremarkable with no radio-opaque gallstones. No biliary ductal dilatation. Pancreas: Atrophic. There is a 1.2 x 1 cm hypodense lesion within the proximal pancreatic body (2:72). No main pancreatic duct dilatation. Spleen: Not enlarged. No focal lesion.  No laceration, subcapsular hematoma, or vascular injury. Adrenal Glands: No nodularity bilaterally. Kidneys: Bilateral kidneys enhance symmetrically. No hydronephrosis. No contusion, laceration, or subcapsular hematoma. No injury to the vascular structures or collecting systems. No hydroureter. The urinary bladder is unremarkable. Bowel: No small or large bowel wall thickening or dilatation. Scattered colonic diverticulosis. The appendix is unremarkable. Mesentery, Omentum, and Peritoneum: Redemonstration of a grossly similar-appearing 3.6 x 3.6 x 5.2 Cm fluid density lesion within the left upper abdomen. No associated peripheral enhancement. No simple free fluid ascites. No pneumoperitoneum. No hemoperitoneum. No mesenteric hematoma identified. No organized fluid collection. Pelvic Organs: Normal. Lymph Nodes: No abdominal, pelvic, inguinal lymphadenopathy. Vasculature: The main portal, splenic, superior mesenteric veins are patent. Atherosclerotic plaque. No abdominal aorta or iliac aneurysm. No active contrast extravasation or pseudoaneurysm. Musculoskeletal: No significant soft tissue hematoma. Diffusely decreased bone density. No acute pelvic fracture. Chronic stable L4-L5 compression fractures. No acute spinal fracture. Redemonstration of subacute right L1-L2 transverse process fractures. Redemonstration of subacute S3 and S4 vertebral body fractures. IMPRESSION: 1. Pulmonary findings consistent with infection/inflammation.  Associated debris within the trachea. Followup PA and lateral chest X-ray is recommended in 3-4 weeks following therapy to ensure resolution and exclude underlying malignancy. 2. Mediastinal lymphadenopathy likely reactive in etiology. Attention on follow-up. 3. No acute traumatic injury to the chest, abdomen, or pelvis. 4. No acute fracture or traumatic malalignment of the thoracic or lumbar spine.Redemonstration of subacute right L1-L2 transverse process as well as S3 and S4 fractures. These were acute on 10/19/2021. 5. Markedly limited evaluation of the ribs for acute fracture due to respiratory motion artifact. Known subacute rib fractures are again noted but poorly evaluated due to motion artifact. Other imaging findings of potential clinical significance: 1. Grossly similar-appearing 3.6 x 3.6 x 5.2cm fluid density lesion within the left upper abdomen. 2. Slightly more prominent due to administration of intravenous contrast, indeterminate 1.2 x 1 cm hypodense lesion within the proximal pancreatic body-consider nonemergent MRI pancreatic protocol. When the patient is clinically stable and able to follow directions and hold their breath (preferably as an outpatient) further evaluation with dedicated abdominal MRI should be considered. 3. Aortic Atherosclerosis (ICD10-I70.0) including coronary artery calcifications. 4. Diffusely decreased bone density. Electronically Signed   By: Iven Finn M.D.   On: 11/02/2021 16:46   DG Chest Portable 1 View  Result Date: 11/02/2021 CLINICAL DATA:  Pt diagnosed with pneumonia at [facility] on 11/01/2021, Cough, sob, congestion Neg covid 09/11/21 EXAM: PORTABLE CHEST - 1 VIEW COMPARISON:  10/19/2021 FINDINGS: New left perihilar airspace disease and some patchy left retrocardiac infiltrate. Right lung remains clear. Heart size and mediastinal contours are within normal limits. Aortic Atherosclerosis (ICD10-170.0). No effusion.  No pneumothorax. Visualized bones unremarkable.  IMPRESSION: 1. New left perihilar airspace disease and patchy left retrocardiac opacities suggesting pneumonia. Electronically Signed   By: Lucrezia Europe M.D.   On: 11/02/2021 15:21    Ranie Chinchilla Wynetta Emery, MD How to contact the [facility] Attending or Consulting provider [place] or covering provider during after hours [place], for this patient?  Check the care team in [facility] and look for a) attending/consulting [facility] provider listed and b) the [facility] team listed Log into [email or web address] and use [place] universal password to access. If you do not have the password, please contact the hospital operator. Locate the [facility] provider you are looking for under [facility] and page to a number that you can be directly reached. If you still have difficulty reaching the provider, please  page the [facility] (Director on Call) for the Hospitalists listed on amion for assistance.   [facility]  If 7PM-7AM, please contact night-coverage [email or web address] Password [email or web address] 11/07/2021, 11:28 AM   LOS: 2 days

## 2021-11-08 NOTE — Progress Notes (Signed)
PROGRESS NOTE  Paul Keith GLO:[id number] DOB: December 24, 1929 DOA: 11/11/2021 PCP: Wilburt Finlay, MD  Brief Presentation History:  87 year old male with a history of CKD stage III, NHL (lymphoblastic lymphoma) in remission, hyperlipidemia, myasthenia gravis, BPH, MGUS presenting from [facility] secondary to 3-day history of progressive generalized weakness, decreased oral intake, and pneumonia.  Notably, the patient was recently admitted to [facility] from 10/20/2019 to 10/22/2021 for altered mental status and frequent falls.  During his hospital stay there, the patient was confused and had hospital delirium.  He was found to have acute nondisplaced fractures of right L1 and L2 transverse processes as well as S3 and S4 vertebral bodies.  He had unchanged compression deformities of L4-L5.  He also has subacute appearing fractures of the right eighth  to 10th ribs and acute right 11th rib fracture.  During his hospital stay, he was noted to have an ammonia 57 and given lactulose.  His ammonia returned to normal.  At the time of his discharge to [facility], the patient was awake and alert and eating.  Prior to his admission to [facility] the patient was living alone, but his son and daughter were taking turns staying with him on 24-hour basis.  The patient had been fairly independent and doing well from a functional standpoint until he began having frequent falls beginning in September 2022.  Daughter did relate that he did experience some mild cognitive decline in the last 8 to 12 months.  He presents on 11/02/2021 from [facility] secondary to 3 to 4 days of increasing shortness of breath, coughing and chest congestion.  Chest x-ray showed pneumonia.  The patient apparently was started on some type of antibiotic.  He continued to have chest congestion and decreased oral intake and worsening generalized weakness.  As result, the patient was brought to the ER for further evaluation and treatment.  In the  ED, the patient had low-grade temperature 100.3.  He was tachycardic.  He had soft blood pressures.  The patient had oxygen saturation in the mid 80s on room air.  He was placed on a nonrebreather initially.  WBC 18.6, hemoglobin 11.3, platelets 223,000.  CT chest showed diffuse GGO and hypodense consolidation opacities in the bilateral lower lobes with debris in the trachea.  CT of the abdomen and pelvis showed a chronic left upper abdomen fluid density collection.  CT of the brain showed mild chronic small vessel disease.  ABG pH 7.3 36/44/37/21 on 80%.  After palliative discussions patient was made full comfort care and GIP status.    Assessment/Plan: Severe Sepsis Dysphagia  Severe protein calorie malnutrition Acute respiratory failure with hypoxia CKD stage IIIb Lumbar transverse process fractures and rib fractures BPH Myasthenia gravis Lymphoblastic lymphoma MGUS Transaminitis Pancreatic lesion   Patient has been admitted into virtual hospice GIP status and will receive full comfort measures.  Please see palliative care consultation notes and orders.   Status is: GIP virtual inpatient hospice   Family Communication:   daughter updated 12/20, 12/21, 12/22  Consultants:  palliative care   Code Status:  DNR--confirmed with daughter  Procedures: As Listed in Progress Note Above  Antibiotics: Zosyn 12/20>> Azithro 12/29>>  Subjective: Patient unresponsive but appears comfortable   Objective: Vitals:   11/07/21 0505 11/07/21 1438 11/07/21 2039 11/08/21 0433  BP: 126/74 129/68 102/66 138/73  Pulse: (!) 103 (!) 132 96 (!) 104  Resp: 16 15 18 20   Temp: 98.2 F (  36.8 C) 98.3 F (36.8 C) 98.6 F (37 C) 99.1 F (37.3 C)  TempSrc: Oral Oral Oral Oral  SpO2:  (!) 79%      Intake/Output Summary (Last 24 hours) at 11/08/2021 1117 Last data filed at 11/08/2021 0900 Gross per 24 hour  Intake 0 ml  Output 550 ml  Net -550 ml   Weight change:  Exam:  General:  frail,  emaciated, somnolent, unresponsive, does not follows command appropriately, not in acute distress  Data Reviewed: I have personally reviewed following labs and imaging studies  Basic Metabolic Panel: Recent Labs  Lab 11/02/21 1325 11/03/21 0421 11/03/21 0450 11/04/21 0357  NA 141 143  --  145  K 4.3 3.8  --  3.2*  CL 105 110  --  113*  CO2 24 22  --  23  GLUCOSE 117* 109*  --  98  BUN 70* 67*  --  71*  CREATININE 1.66* 1.33*  --  1.40*  CALCIUM 9.4 8.8*  --  8.7*  MG  --   --  2.5* 2.6*   Liver Function Tests: Recent Labs  Lab 11/02/21 1325 11/03/21 0421 11/04/21 0357  AST 113* 78* 107*  ALT 79* 65* 81*  ALKPHOS 121 91 92  BILITOT 1.1 0.3 0.8  PROT 6.9 5.4* 4.8*  ALBUMIN 2.8* 2.2* 2.0*   No results for input(s): LIPASE, AMYLASE in the last 168 hours. Recent Labs  Lab 11/02/21 1328  AMMONIA 23   Coagulation Profile: Recent Labs  Lab 11/02/21 1325  INR 1.3*   CBC: Recent Labs  Lab 11/02/21 1325 11/03/21 0421 11/04/21 0357  WBC 18.6* 12.9* 6.9  NEUTROABS 16.9*  --   --   HGB 11.3* 8.6* 8.5*  HCT 36.2* 28.1* 28.2*  MCV 93.1 93.7 94.9  PLT 323 215 218   Cardiac Enzymes: Recent Labs  Lab 11/03/21 0450  CKTOTAL 37*   BNP: Invalid input(s): POCBNP CBG: Recent Labs  Lab 11/04/21 1122  GLUCAP 81   HbA1C: No results for input(s): HGBA1C in the last 72 hours. Urine analysis:    Component Value Date/Time   COLORURINE YELLOW 11/02/2021 1613   APPEARANCEUR HAZY (A) 11/02/2021 1613   LABSPEC 1.018 11/02/2021 1613   PHURINE 5.0 11/02/2021 1613   GLUCOSEU NEGATIVE 11/02/2021 1613   HGBUR SMALL (A) 11/02/2021 1613   BILIRUBINUR NEGATIVE 11/02/2021 1613   KETONESUR NEGATIVE 11/02/2021 1613   PROTEINUR 30 (A) 11/02/2021 1613   NITRITE NEGATIVE 11/02/2021 1613   LEUKOCYTESUR MODERATE (A) 11/02/2021 1613   Recent Results (from the past 240 hour(s))  Culture, blood (Routine x 2)     Status: None   Collection Time: 11/02/21  1:23 PM   Specimen:  BLOOD RIGHT FOREARM  Result Value Ref Range Status   Specimen Description BLOOD RIGHT FOREARM  Final   Special Requests   Final    BOTTLES DRAWN AEROBIC AND ANAEROBIC Blood Culture adequate volume   Culture   Final    NO GROWTH 5 DAYS Performed at [facility], [address], [place],  [address]    Report Status 11/07/2021 FINAL  Final  Culture, blood (Routine x 2)     Status: None   Collection Time: 11/02/21  1:24 PM   Specimen: BLOOD LEFT HAND  Result Value Ref Range Status   Specimen Description BLOOD LEFT HAND  Final   Special Requests   Final    BOTTLES DRAWN AEROBIC AND ANAEROBIC Blood Culture results may not be optimal due to  an inadequate volume of blood received in culture bottles   Culture   Final    NO GROWTH 5 DAYS Performed at [facility], [address], [place], [place] [address]    Report Status 11/07/2021 FINAL  Final  Resp Panel by RT-PCR (Flu A&B, Covid) Nasopharyngeal Swab     Status: None   Collection Time: 11/02/21  1:25 PM   Specimen: Nasopharyngeal Swab; Nasopharyngeal(NP) swabs in vial transport medium  Result Value Ref Range Status   SARS Coronavirus 2 by RT PCR NEGATIVE NEGATIVE Final    Comment: (NOTE) SARS-CoV-2 target nucleic acids are NOT DETECTED.  The SARS-CoV-2 RNA is generally detectable in upper respiratory specimens during the acute phase of infection. The lowest concentration of SARS-CoV-2 viral copies this assay can detect is 138 copies/mL. A negative result does not preclude SARS-Cov-2 infection and should not be used as the sole basis for treatment or other patient management decisions. A negative result may occur with  improper specimen collection/handling, submission of specimen other than nasopharyngeal swab, presence of viral mutation(s) within the areas targeted by this assay, and inadequate number of viral copies(<138 copies/mL). A negative result must be combined with clinical observations, patient history, and  epidemiological information. The expected result is Negative.  Fact Sheet for Patients:  EntrepreneurPulse.com.au  Fact Sheet for Healthcare Providers:  IncredibleEmployment.be  This test is no t yet approved or cleared by the [place] FDA and  has been authorized for detection and/or diagnosis of SARS-CoV-2 by FDA under an Emergency Use Authorization (EUA). This EUA will remain  in effect (meaning this test can be used) for the duration of the COVID-19 declaration under Section 564(b)(1) of the Act, 21 U.S.C.section 360bbb-3(b)(1), unless the authorization is terminated  or revoked sooner.       Influenza A by PCR NEGATIVE NEGATIVE Final   Influenza B by PCR NEGATIVE NEGATIVE Final    Comment: (NOTE) The Xpert Xpress SARS-CoV-2/FLU/RSV plus assay is intended as an aid in the diagnosis of influenza from Nasopharyngeal swab specimens and should not be used as a sole basis for treatment. Nasal washings and aspirates are unacceptable for Xpert Xpress SARS-CoV-2/FLU/RSV testing.  Fact Sheet for Patients: EntrepreneurPulse.com.au  Fact Sheet for Healthcare Providers: IncredibleEmployment.be  This test is not yet approved or cleared by the [place] FDA and has been authorized for detection and/or diagnosis of SARS-CoV-2 by FDA under an Emergency Use Authorization (EUA). This EUA will remain in effect (meaning this test can be used) for the duration of the COVID-19 declaration under Section 564(b)(1) of the Act, 21 U.S.C. section 360bbb-3(b)(1), unless the authorization is terminated or revoked.  Performed at [facility], [address], [place], [place] [address]   Urine Culture     Status: Abnormal   Collection Time: 11/02/21  4:13 PM   Specimen: Urine, Catheterized  Result Value Ref Range Status   Specimen Description   Final    URINE, CATHETERIZED Performed at [facility], [address] [place], [name] [address]    Special Requests   Final    NONE Performed at [facility], [address] [place], [place] [address]    Culture MULTIPLE SPECIES PRESENT, SUGGEST RECOLLECTION (A)  Final   Report Status 11/03/2021 FINAL  Final  C Difficile Quick Screen w PCR reflex     Status: None   Collection Time: 11/02/21  4:14 PM   Specimen: STOOL  Result Value Ref Range Status   C Diff antigen NEGATIVE NEGATIVE  Final   C Diff toxin NEGATIVE NEGATIVE Final   C Diff interpretation No C. difficile detected.  Final    Comment: Performed at [facility], [address] [place], [place] [address]  MRSA Next Gen by PCR, Nasal     Status: None   Collection Time: 11/02/21  7:50 PM   Specimen: Nasal Mucosa; Nasal Swab  Result Value Ref Range Status   MRSA by PCR Next Gen NOT DETECTED NOT DETECTED Final    Comment: (NOTE) The GeneXpert MRSA Assay (FDA approved for NASAL specimens only), is one component of a comprehensive MRSA colonization surveillance program. It is not intended to diagnose MRSA infection nor to guide or monitor treatment for MRSA infections. Test performance is not FDA approved in patients less than 61 years old. Performed at [facility], [address], [place], [place] [address]      Scheduled Meds:   HYDROmorphone (DILAUDID) injection  1 mg Intravenous Q4H   Continuous Infusions:  Procedures/Studies: CT Head Wo Contrast  Result Date: 11/02/2021 CLINICAL DATA:  Sepsis.  Fall.  Head trauma. EXAM: CT HEAD WITHOUT CONTRAST TECHNIQUE: Contiguous axial images were obtained from the base of the skull through the vertex without intravenous contrast. COMPARISON:  10/19/2021 FINDINGS: Brain: Generalized brain atrophy. Mild chronic small-vessel ischemic change of the white matter. No sign of acute infarction, mass lesion, hemorrhage, hydrocephalus or extra-axial collection. Vascular: There is atherosclerotic calcification of the major vessels at the base of the brain. Skull:  Negative Sinuses/Orbits: Clear/normal Other: None IMPRESSION: No acute or traumatic finding. Age related atrophy. Mild chronic small-vessel ischemic change of the white matter. Electronically Signed   By: Nelson Chimes M.D.   On: 11/02/2021 16:26   CT CHEST ABDOMEN PELVIS W CONTRAST  Result Date: 11/02/2021 CLINICAL DATA:  Polytrauma, blunt fall, hypoxia, sepsis EXAM: CT CHEST, ABDOMEN, AND PELVIS WITH CONTRAST TECHNIQUE: Multidetector CT imaging of the chest, abdomen and pelvis was performed following the standard protocol during bolus administration of intravenous contrast. CONTRAST:  14mL OMNIPAQUE IOHEXOL 300 MG/ML  SOLN COMPARISON:  CT chest, abdomen, pelvis 10/19/2021. CT chest abdomen pelvis 09/21/2021 FINDINGS: CHEST: Ports and Devices: None. Lungs/airways: Diffuse peribronchovascular ground-glass and hypodense heterogeneous consolidative airspace opacities that are most prominent within the bilateral lower lobes. Limited evaluation for pulmonary nodule due to motion artifact and consolidation. No pulmonary mass. No pulmonary contusion or laceration. No pneumatocele formation. Debris within the trachea. Otherwise the central airways are patent. Pleura: No pleural effusion. No pneumothorax. No hemothorax. Lymph Nodes: Enlarged mediastinal lymph nodes: 1.3 cm aortopulmonary window. No hilar or axillary lymphadenopathy. Mediastinum: No pneumomediastinum. No aortic injury or mediastinal hematoma. The thoracic aorta is normal in caliber. At least moderate atherosclerotic plaque. Coronary artery calcifications. The heart is normal in size. No significant pericardial effusion. The main pulmonary artery is normal in caliber. No central pulmonary embolus. The esophagus is unremarkable. The thyroid is unremarkable. Chest Wall / Breasts: No chest wall mass. Musculoskeletal: Diffusely decreased bone density. No acute rib or sternal fracture; however, markedly limited evaluation due to respiratory motion artifact.  Known subacute rib fractures are again noted and are poorly evaluation due to motion artifact. No acute spinal fracture. ABDOMEN / PELVIS: Liver: Not enlarged. No focal lesion. No laceration or subcapsular hematoma. Biliary System: The gallbladder is otherwise unremarkable with no radio-opaque gallstones. No biliary ductal dilatation. Pancreas: Atrophic. There is a 1.2 x 1 cm hypodense lesion within the proximal pancreatic body (2:72). No main pancreatic duct dilatation. Spleen: Not enlarged.  No focal lesion. No laceration, subcapsular hematoma, or vascular injury. Adrenal Glands: No nodularity bilaterally. Kidneys: Bilateral kidneys enhance symmetrically. No hydronephrosis. No contusion, laceration, or subcapsular hematoma. No injury to the vascular structures or collecting systems. No hydroureter. The urinary bladder is unremarkable. Bowel: No small or large bowel wall thickening or dilatation. Scattered colonic diverticulosis. The appendix is unremarkable. Mesentery, Omentum, and Peritoneum: Redemonstration of a grossly similar-appearing 3.6 x 3.6 x 5.2 Cm fluid density lesion within the left upper abdomen. No associated peripheral enhancement. No simple free fluid ascites. No pneumoperitoneum. No hemoperitoneum. No mesenteric hematoma identified. No organized fluid collection. Pelvic Organs: Normal. Lymph Nodes: No abdominal, pelvic, inguinal lymphadenopathy. Vasculature: The main portal, splenic, superior mesenteric veins are patent. Atherosclerotic plaque. No abdominal aorta or iliac aneurysm. No active contrast extravasation or pseudoaneurysm. Musculoskeletal: No significant soft tissue hematoma. Diffusely decreased bone density. No acute pelvic fracture. Chronic stable L4-L5 compression fractures. No acute spinal fracture. Redemonstration of subacute right L1-L2 transverse process fractures. Redemonstration of subacute S3 and S4 vertebral body fractures. IMPRESSION: 1. Pulmonary findings consistent with  infection/inflammation. Associated debris within the trachea. Followup PA and lateral chest X-ray is recommended in 3-4 weeks following therapy to ensure resolution and exclude underlying malignancy. 2. Mediastinal lymphadenopathy likely reactive in etiology. Attention on follow-up. 3. No acute traumatic injury to the chest, abdomen, or pelvis. 4. No acute fracture or traumatic malalignment of the thoracic or lumbar spine.Redemonstration of subacute right L1-L2 transverse process as well as S3 and S4 fractures. These were acute on 10/19/2021. 5. Markedly limited evaluation of the ribs for acute fracture due to respiratory motion artifact. Known subacute rib fractures are again noted but poorly evaluated due to motion artifact. Other imaging findings of potential clinical significance: 1. Grossly similar-appearing 3.6 x 3.6 x 5.2cm fluid density lesion within the left upper abdomen. 2. Slightly more prominent due to administration of intravenous contrast, indeterminate 1.2 x 1 cm hypodense lesion within the proximal pancreatic body-consider nonemergent MRI pancreatic protocol. When the patient is clinically stable and able to follow directions and hold their breath (preferably as an outpatient) further evaluation with dedicated abdominal MRI should be considered. 3. Aortic Atherosclerosis (ICD10-I70.0) including coronary artery calcifications. 4. Diffusely decreased bone density. Electronically Signed   By: Iven Finn M.D.   On: 11/02/2021 16:46   DG Chest Portable 1 View  Result Date: 11/02/2021 CLINICAL DATA:  Pt diagnosed with pneumonia at [facility] on 11/01/2021, Cough, sob, congestion Neg covid 09/11/21 EXAM: PORTABLE CHEST - 1 VIEW COMPARISON:  10/19/2021 FINDINGS: New left perihilar airspace disease and some patchy left retrocardiac infiltrate. Right lung remains clear. Heart size and mediastinal contours are within normal limits. Aortic Atherosclerosis (ICD10-170.0). No effusion.  No pneumothorax.  Visualized bones unremarkable. IMPRESSION: 1. New left perihilar airspace disease and patchy left retrocardiac opacities suggesting pneumonia. Electronically Signed   By: Lucrezia Europe M.D.   On: 11/02/2021 15:21    Geordie Nooney Wynetta Emery, MD How to contact the [facility] Attending or Consulting provider [place] or covering provider during after hours [place], for this patient?  Check the care team in [facility] and look for a) attending/consulting [facility] provider listed and b) the [facility] team listed Log into [email or web address] and use [place]'s universal password to access. If you do not have the password, please contact the hospital operator. Locate the [facility] provider you are looking for under [facility] and page to a number that you can be directly reached. If you still have difficulty reaching  the provider, please page the [facility] (Director on Call) for the Hospitalists listed on amion for assistance.   [facility]  If 7PM-7AM, please contact night-coverage [email or web address] Password [email or web address] 11/08/2021, 11:17 AM   LOS: 3 days

## 2021-11-09 DIAGNOSIS — Z515 Encounter for palliative care: Principal | ICD-10-CM

## 2021-11-15 NOTE — Progress Notes (Signed)
Palliative: Chart review completed.  Family has elected comfort and dignity at end-of-life, hospice care for Mr. Devine.  Mr. Munford has been accepted into [place], residential hospice of [facility].  No bed available at this time.  He is under comfort care here at the hospital.    End-of-life order set has been implemented.  DNR on chart.   Transition of care is working for residential hospice placement.  Conference with attending, bedside nursing staff, transition of care team related to patient condition, needs, goals of care.  Plan: Comfort and dignity at end-of-life, residential hospice with [place].  No bed available at this time, GIP status here at [facility].  15 minutes Quinn Axe, NP Palliative medicine team Team phone [phone] Greater than 50% of this time was spent counseling and coordinating care related to the above assessment and plan.

## 2021-11-15 NOTE — Death Summary Note (Signed)
DEATH SUMMARY   Patient Details  Name: Paul Keith MRN: [id number] DOB: August 06, 1930  Admission/Discharge Information   Admit Date:  12/02/2021  Date of Death: Date of Death: 12-06-21  Time of Death: Time of Death: 1640  Length of Stay: 4  Referring Physician: Wilburt Finlay, MD   Reason(s) for Hospitalization  88 year old male with a history of CKD stage III, NHL (lymphoblastic lymphoma) in remission, hyperlipidemia, myasthenia gravis, BPH, MGUS presenting from [facility] secondary to 3-day history of progressive generalized weakness, decreased oral intake, and pneumonia.  Notably, the patient was recently admitted to [facility] from 10/20/2019 to 10/22/2021 for altered mental status and frequent falls.  During his hospital stay there, the patient was confused and had hospital delirium.  He was found to have acute nondisplaced fractures of right L1 and L2 transverse processes as well as S3 and S4 vertebral bodies.  He had unchanged compression deformities of L4-L5.  He also has subacute appearing fractures of the right eighth  to 10th ribs and acute right 11th rib fracture.  During his hospital stay, he was noted to have an ammonia 57 and given lactulose.  His ammonia returned to normal.  At the time of his discharge to [facility], the patient was awake and alert and eating.   Prior to his admission to [facility] the patient was living alone, but his son and daughter were taking turns staying with him on 24-hour basis.  The patient had been fairly independent and doing well from a functional standpoint until he began having frequent falls beginning in September 2022.  Daughter did relate that he did experience some mild cognitive decline in the last 8 to 12 months.   He presents on 11/02/2021 from [facility] secondary to 3 to 4 days of increasing shortness of breath, coughing and chest congestion.  Chest x-ray showed pneumonia.  The patient apparently was started on some type of antibiotic.  He  continued to have chest congestion and decreased oral intake and worsening generalized weakness.  As result, the patient was brought to the ER for further evaluation and treatment.  In the ED, the patient had low-grade temperature 100.3.  He was tachycardic.  He had soft blood pressures.  The patient had oxygen saturation in the mid 80s on room air.  He was placed on a nonrebreather initially.  WBC 18.6, hemoglobin 11.3, platelets 223,000.  CT chest showed diffuse GGO and hypodense consolidation opacities in the bilateral lower lobes with debris in the trachea.  CT of the abdomen and pelvis showed a chronic left upper abdomen fluid density collection.  CT of the brain showed mild chronic small vessel disease.  ABG pH 7.3 36/44/37/21 on 80%.    Diagnoses  Preliminary cause of death:  Secondary Diagnoses (including complications and co-morbidities):  Principal Problem:   Comfort measures only status Active Problems:   Anemia   Pancytopenia (HCC)   IgM monoclonal gammopathy of uncertain significance   Lymphoproliferative disease (HCC)   Waldenstrom macroglobulinemia (HCC)   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HCC)   At high risk for infection due to neutropenia   Acute renal failure superimposed on stage 4 chronic kidney disease (HCC)   Myasthenia gravis (HCC)   BPH (benign prostatic hyperplasia)   Luetscher's syndrome   Frequent falls   CLL (chronic lymphocytic leukemia) (HCC)   Severe sepsis (HCC)   Pancreatic lesion   Aspiration pneumonitis (HCC)   Protein-calorie malnutrition, severe   Pressure injury of skin Principal Problem:   Comfort  measures only status Active Problems:   Anemia   Pancytopenia (HCC)   IgM monoclonal gammopathy of uncertain significance   Lymphoproliferative disease (HCC)   Waldenstrom macroglobulinemia (HCC)   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HCC)   At high risk for infection due to neutropenia   Acute renal failure superimposed on stage 4 chronic  kidney disease (HCC)   Myasthenia gravis (HCC)   BPH (benign prostatic hyperplasia)   Luetscher's syndrome   Frequent falls   CLL (chronic lymphocytic leukemia) (HCC)   Severe sepsis (HCC)   Pancreatic lesion   Aspiration pneumonitis (HCC)   Protein-calorie malnutrition, severe   Pressure injury of skin    Brief Hospital Course (including significant findings, care, treatment, and services provided and events leading to death)  Paul Keith is a 88 y.o. year old male   Severe Sepsis -Present on admission -Secondary to pneumonia -Lactic acid peaked 2.1>>1.5 -continue with gentle IV fluids as he is not eating / drinking -Continue empiric IV antibiotics - PCT is markedly elevated consistent with a bacterial infection  - Pt continued with decline and no positive response to treatments, after discussion with palliative care decision made by family for starting full comfort care measures on 11/04/21.    Aspiration pneumonia -Broadened antibiotic coverage to Zosyn -Continue azithromycin -Personally reviewed chest x-ray--bilateral patchy opacities, left greater than right -Speech therapy evaluation -MRSA screen--neg -12/29 CT chest--difuse GGO and consolidative opacities bilateral LL; debris in trachea; subacute rib fx - Pt continued with decline and no positive response to treatments, after discussion with palliative care decision made by family for starting full comfort care measures on 11/04/21.    Dysphagia  - seen by SLP and placed on dys 1 diet - he remains high risk for recurrent aspiration    Severe protein calorie malnutrition  -Patient continues to refuse to eat despite encouragement from staff and family -Order had asked about feeding tube however I think this is a bad idea given patient irritability and likely will not tolerated -I have asked for palliative medicine consultation to discuss goals of care with family members -To discuss with family members regarding  PEG placement.  Decision made by family to pursue full comfort measures.    Acute respiratory failure with hypoxia -due to pneumonia -presented with saturation 87% on RA with tachypnea -stable on 4L -wean as tolerated to RA for saturation >92% - Pt continued with decline and no positive response to treatments, after discussion with palliative care decision made by family for starting full comfort care measures on 11/04/21. \   CKD stage IIIb -Baseline creatinine 1.3-1.6   Lumbar transverse process fractures and rib fractures  BPH -tamsulosin   Myasthenia gravis -Continue Mestinon TID as long as he is able to swallow the pills    Lymphoblastic lymphoma -Currently in remission -Last received rituximab 02/02/2017   MGUS -oncology    Diarrhea -C. difficile negative   Transaminitis  -due to sepsis -CT abd--Neg liver, neg GB -trending down   Abdominal fluid collection -likely benign -present since 2017   Pancreatic lesion - radiologist recommended outpatient MRI for follow up when patient clinically improved - at this time, not sure patient is going to have a meaningful recovery as he is not eating and drinking  Consultants:  palliative care    Code Status:  DNR--confirmed with daughter      Pertinent Labs and Studies  Significant Diagnostic Studies CT Head Wo Contrast  Result Date: 11/02/2021 CLINICAL DATA:  Sepsis.  Fall.  Head trauma. EXAM: CT HEAD WITHOUT CONTRAST TECHNIQUE: Contiguous axial images were obtained from the base of the skull through the vertex without intravenous contrast. COMPARISON:  10/19/2021 FINDINGS: Brain: Generalized brain atrophy. Mild chronic small-vessel ischemic change of the white matter. No sign of acute infarction, mass lesion, hemorrhage, hydrocephalus or extra-axial collection. Vascular: There is atherosclerotic calcification of the major vessels at the base of the brain. Skull: Negative Sinuses/Orbits: Clear/normal Other: None  IMPRESSION: No acute or traumatic finding. Age related atrophy. Mild chronic small-vessel ischemic change of the white matter. Electronically Signed   By: Nelson Chimes M.D.   On: 11/02/2021 16:26   CT CHEST ABDOMEN PELVIS W CONTRAST  Result Date: 11/02/2021 CLINICAL DATA:  Polytrauma, blunt fall, hypoxia, sepsis EXAM: CT CHEST, ABDOMEN, AND PELVIS WITH CONTRAST TECHNIQUE: Multidetector CT imaging of the chest, abdomen and pelvis was performed following the standard protocol during bolus administration of intravenous contrast. CONTRAST:  93mL OMNIPAQUE IOHEXOL 300 MG/ML  SOLN COMPARISON:  CT chest, abdomen, pelvis 10/19/2021. CT chest abdomen pelvis 09/21/2021 FINDINGS: CHEST: Ports and Devices: None. Lungs/airways: Diffuse peribronchovascular ground-glass and hypodense heterogeneous consolidative airspace opacities that are most prominent within the bilateral lower lobes. Limited evaluation for pulmonary nodule due to motion artifact and consolidation. No pulmonary mass. No pulmonary contusion or laceration. No pneumatocele formation. Debris within the trachea. Otherwise the central airways are patent. Pleura: No pleural effusion. No pneumothorax. No hemothorax. Lymph Nodes: Enlarged mediastinal lymph nodes: 1.3 cm aortopulmonary window. No hilar or axillary lymphadenopathy. Mediastinum: No pneumomediastinum. No aortic injury or mediastinal hematoma. The thoracic aorta is normal in caliber. At least moderate atherosclerotic plaque. Coronary artery calcifications. The heart is normal in size. No significant pericardial effusion. The main pulmonary artery is normal in caliber. No central pulmonary embolus. The esophagus is unremarkable. The thyroid is unremarkable. Chest Wall / Breasts: No chest wall mass. Musculoskeletal: Diffusely decreased bone density. No acute rib or sternal fracture; however, markedly limited evaluation due to respiratory motion artifact. Known subacute rib fractures are again noted and are  poorly evaluation due to motion artifact. No acute spinal fracture. ABDOMEN / PELVIS: Liver: Not enlarged. No focal lesion. No laceration or subcapsular hematoma. Biliary System: The gallbladder is otherwise unremarkable with no radio-opaque gallstones. No biliary ductal dilatation. Pancreas: Atrophic. There is a 1.2 x 1 cm hypodense lesion within the proximal pancreatic body (2:72). No main pancreatic duct dilatation. Spleen: Not enlarged. No focal lesion. No laceration, subcapsular hematoma, or vascular injury. Adrenal Glands: No nodularity bilaterally. Kidneys: Bilateral kidneys enhance symmetrically. No hydronephrosis. No contusion, laceration, or subcapsular hematoma. No injury to the vascular structures or collecting systems. No hydroureter. The urinary bladder is unremarkable. Bowel: No small or large bowel wall thickening or dilatation. Scattered colonic diverticulosis. The appendix is unremarkable. Mesentery, Omentum, and Peritoneum: Redemonstration of a grossly similar-appearing 3.6 x 3.6 x 5.2 Cm fluid density lesion within the left upper abdomen. No associated peripheral enhancement. No simple free fluid ascites. No pneumoperitoneum. No hemoperitoneum. No mesenteric hematoma identified. No organized fluid collection. Pelvic Organs: Normal. Lymph Nodes: No abdominal, pelvic, inguinal lymphadenopathy. Vasculature: The main portal, splenic, superior mesenteric veins are patent. Atherosclerotic plaque. No abdominal aorta or iliac aneurysm. No active contrast extravasation or pseudoaneurysm. Musculoskeletal: No significant soft tissue hematoma. Diffusely decreased bone density. No acute pelvic fracture. Chronic stable L4-L5 compression fractures. No acute spinal fracture. Redemonstration of subacute right L1-L2 transverse process fractures. Redemonstration of subacute S3 and S4 vertebral body fractures. IMPRESSION: 1. Pulmonary findings consistent with  infection/inflammation. Associated debris within the  trachea. Followup PA and lateral chest X-ray is recommended in 3-4 weeks following therapy to ensure resolution and exclude underlying malignancy. 2. Mediastinal lymphadenopathy likely reactive in etiology. Attention on follow-up. 3. No acute traumatic injury to the chest, abdomen, or pelvis. 4. No acute fracture or traumatic malalignment of the thoracic or lumbar spine.Redemonstration of subacute right L1-L2 transverse process as well as S3 and S4 fractures. These were acute on 10/19/2021. 5. Markedly limited evaluation of the ribs for acute fracture due to respiratory motion artifact. Known subacute rib fractures are again noted but poorly evaluated due to motion artifact. Other imaging findings of potential clinical significance: 1. Grossly similar-appearing 3.6 x 3.6 x 5.2cm fluid density lesion within the left upper abdomen. 2. Slightly more prominent due to administration of intravenous contrast, indeterminate 1.2 x 1 cm hypodense lesion within the proximal pancreatic body-consider nonemergent MRI pancreatic protocol. When the patient is clinically stable and able to follow directions and hold their breath (preferably as an outpatient) further evaluation with dedicated abdominal MRI should be considered. 3. Aortic Atherosclerosis (ICD10-I70.0) including coronary artery calcifications. 4. Diffusely decreased bone density. Electronically Signed   By: Iven Finn M.D.   On: 11/02/2021 16:46   DG Chest Portable 1 View  Result Date: 11/02/2021 CLINICAL DATA:  Pt diagnosed with pneumonia at [facility] on 11/01/2021, Cough, sob, congestion Neg covid 09/11/21 EXAM: PORTABLE CHEST - 1 VIEW COMPARISON:  10/19/2021 FINDINGS: New left perihilar airspace disease and some patchy left retrocardiac infiltrate. Right lung remains clear. Heart size and mediastinal contours are within normal limits. Aortic Atherosclerosis (ICD10-170.0). No effusion.  No pneumothorax. Visualized bones unremarkable. IMPRESSION: 1. New left  perihilar airspace disease and patchy left retrocardiac opacities suggesting pneumonia. Electronically Signed   By: Lucrezia Europe M.D.   On: 11/02/2021 15:21    Microbiology Recent Results (from the past 240 hour(s))  Culture, blood (Routine x 2)     Status: None   Collection Time: 11/02/21  1:23 PM   Specimen: BLOOD RIGHT FOREARM  Result Value Ref Range Status   Specimen Description BLOOD RIGHT FOREARM  Final   Special Requests   Final    BOTTLES DRAWN AEROBIC AND ANAEROBIC Blood Culture adequate volume   Culture   Final    NO GROWTH 5 DAYS Performed at [facility], [address] [place], [place] [address]    Report Status 11/07/2021 FINAL  Final  Culture, blood (Routine x 2)     Status: None   Collection Time: 11/02/21  1:24 PM   Specimen: BLOOD LEFT HAND  Result Value Ref Range Status   Specimen Description BLOOD LEFT HAND  Final   Special Requests   Final    BOTTLES DRAWN AEROBIC AND ANAEROBIC Blood Culture results may not be optimal due to an inadequate volume of blood received in culture bottles   Culture   Final    NO GROWTH 5 DAYS Performed at [facility], [address], [place], [place] [address]    Report Status 11/07/2021 FINAL  Final  Resp Panel by RT-PCR (Flu A&B, Covid) Nasopharyngeal Swab     Status: None   Collection Time: 11/02/21  1:25 PM   Specimen: Nasopharyngeal Swab; Nasopharyngeal(NP) swabs in vial transport medium  Result Value Ref Range Status   SARS Coronavirus 2 by RT PCR NEGATIVE NEGATIVE Final    Comment: (NOTE) SARS-CoV-2 target nucleic acids are NOT DETECTED.  The SARS-CoV-2 RNA is generally detectable in upper respiratory specimens  during the acute phase of infection. The lowest concentration of SARS-CoV-2 viral copies this assay can detect is 138 copies/mL. A negative result does not preclude SARS-Cov-2 infection and should not be used as the sole basis for treatment or other patient management decisions. A negative result may occur with   improper specimen collection/handling, submission of specimen other than nasopharyngeal swab, presence of viral mutation(s) within the areas targeted by this assay, and inadequate number of viral copies(<138 copies/mL). A negative result must be combined with clinical observations, patient history, and epidemiological information. The expected result is Negative.  Fact Sheet for Patients:  EntrepreneurPulse.com.au  Fact Sheet for Healthcare Providers:  IncredibleEmployment.be  This test is no t yet approved or cleared by the [place] FDA and  has been authorized for detection and/or diagnosis of SARS-CoV-2 by FDA under an Emergency Use Authorization (EUA). This EUA will remain  in effect (meaning this test can be used) for the duration of the COVID-19 declaration under Section 564(b)(1) of the Act, 21 U.S.C.section 360bbb-3(b)(1), unless the authorization is terminated  or revoked sooner.       Influenza A by PCR NEGATIVE NEGATIVE Final   Influenza B by PCR NEGATIVE NEGATIVE Final    Comment: (NOTE) The Xpert Xpress SARS-CoV-2/FLU/RSV plus assay is intended as an aid in the diagnosis of influenza from Nasopharyngeal swab specimens and should not be used as a sole basis for treatment. Nasal washings and aspirates are unacceptable for Xpert Xpress SARS-CoV-2/FLU/RSV testing.  Fact Sheet for Patients: EntrepreneurPulse.com.au  Fact Sheet for Healthcare Providers: IncredibleEmployment.be  This test is not yet approved or cleared by the [place] FDA and has been authorized for detection and/or diagnosis of SARS-CoV-2 by FDA under an Emergency Use Authorization (EUA). This EUA will remain in effect (meaning this test can be used) for the duration of the COVID-19 declaration under Section 564(b)(1) of the Act, 21 U.S.C. section 360bbb-3(b)(1), unless the authorization is terminated  or revoked.  Performed at [facility], [address], [place], [place] [address]   Urine Culture     Status: Abnormal   Collection Time: 11/02/21  4:13 PM   Specimen: Urine, Catheterized  Result Value Ref Range Status   Specimen Description   Final    URINE, CATHETERIZED Performed at [facility], [address] [place], [place] [address]    Special Requests   Final    NONE Performed at [facility], [address], [place], [place] [address]    Culture MULTIPLE SPECIES PRESENT, SUGGEST RECOLLECTION (A)  Final   Report Status 11/03/2021 FINAL  Final  C Difficile Quick Screen w PCR reflex     Status: None   Collection Time: 11/02/21  4:14 PM   Specimen: STOOL  Result Value Ref Range Status   C Diff antigen NEGATIVE NEGATIVE Final   C Diff toxin NEGATIVE NEGATIVE Final   C Diff interpretation No C. difficile detected.  Final    Comment: Performed at [facility], [address] [place], [place] [address]  MRSA Next Gen by PCR, Nasal     Status: None   Collection Time: 11/02/21  7:50 PM   Specimen: Nasal Mucosa; Nasal Swab  Result Value Ref Range Status   MRSA by PCR Next Gen NOT DETECTED NOT DETECTED Final    Comment: (NOTE) The GeneXpert MRSA Assay (FDA approved for NASAL specimens only), is one component of a comprehensive MRSA colonization surveillance program. It is not intended to diagnose MRSA infection nor to guide  or monitor treatment for MRSA infections. Test performance is not FDA approved in patients less than 96 years old. Performed at [facility], [address] [place], [place] [address]     Lab Basic Metabolic Panel: Recent Labs  Lab 11/03/21 0421 11/03/21 0450 11/04/21 0357  NA 143  --  145  K 3.8  --  3.2*  CL 110  --  113*  CO2 22  --  23  GLUCOSE 109*  --  98  BUN 67*  --  71*  CREATININE 1.33*  --  1.40*  CALCIUM 8.8*  --  8.7*  MG  --  2.5* 2.6*   Liver Function Tests: Recent Labs  Lab 11/03/21 0421 11/04/21 0357  AST 78* 107*  ALT  65* 81*  ALKPHOS 91 92  BILITOT 0.3 0.8  PROT 5.4* 4.8*  ALBUMIN 2.2* 2.0*   No results for input(s): LIPASE, AMYLASE in the last 168 hours. No results for input(s): AMMONIA in the last 168 hours. CBC: Recent Labs  Lab 11/03/21 0421 11/04/21 0357  WBC 12.9* 6.9  HGB 8.6* 8.5*  HCT 28.1* 28.2*  MCV 93.7 94.9  PLT 215 218   Cardiac Enzymes: Recent Labs  Lab 11/03/21 0450  CKTOTAL 37*   Sepsis Labs: Recent Labs  Lab 11/03/21 0421 11/03/21 0450 11/04/21 0357  PROCALCITON  --  5.79  --   WBC 12.9*  --  6.9    Procedures/Operations     Paul Keith 11-27-2021, 8:43 PM

## 2021-11-15 NOTE — Progress Notes (Signed)
PROGRESS NOTE  Paul Keith TDH:[id number] DOB: 1930-09-17 DOA: 10/23/2021 PCP: Wilburt Finlay, MD  Brief Presentation History:  88 year old male with a history of CKD stage III, NHL (lymphoblastic lymphoma) in remission, hyperlipidemia, myasthenia gravis, BPH, MGUS presenting from [facility] secondary to 3-day history of progressive generalized weakness, decreased oral intake, and pneumonia.  Notably, the patient was recently admitted to [facility] from 10/20/2019 to 10/22/2021 for altered mental status and frequent falls.  During his hospital stay there, the patient was confused and had hospital delirium.  He was found to have acute nondisplaced fractures of right L1 and L2 transverse processes as well as S3 and S4 vertebral bodies.  He had unchanged compression deformities of L4-L5.  He also has subacute appearing fractures of the right eighth  to 10th ribs and acute right 11th rib fracture.  During his hospital stay, he was noted to have an ammonia 57 and given lactulose.  His ammonia returned to normal.  At the time of his discharge to [facility], the patient was awake and alert and eating.  Prior to his admission to [facility] the patient was living alone, but his son and daughter were taking turns staying with him on 24-hour basis.  The patient had been fairly independent and doing well from a functional standpoint until he began having frequent falls beginning in September 2022.  Daughter did relate that he did experience some mild cognitive decline in the last 8 to 12 months.  He presents on 11/02/2021 from [facility] secondary to 3 to 4 days of increasing shortness of breath, coughing and chest congestion.  Chest x-ray showed pneumonia.  The patient apparently was started on some type of antibiotic.  He continued to have chest congestion and decreased oral intake and worsening generalized weakness.  As result, the patient was brought to the ER for further evaluation and treatment.  In the  ED, the patient had low-grade temperature 100.3.  He was tachycardic.  He had soft blood pressures.  The patient had oxygen saturation in the mid 80s on room air.  He was placed on a nonrebreather initially.  WBC 18.6, hemoglobin 11.3, platelets 223,000.  CT chest showed diffuse GGO and hypodense consolidation opacities in the bilateral lower lobes with debris in the trachea.  CT of the abdomen and pelvis showed a chronic left upper abdomen fluid density collection.  CT of the brain showed mild chronic small vessel disease.  ABG pH 7.3 36/44/37/21 on 80%.  After palliative discussions patient was made full comfort care and GIP status.    Assessment/Plan: Severe Sepsis Dysphagia  Severe protein calorie malnutrition Acute respiratory failure with hypoxia CKD stage IIIb Lumbar transverse process fractures and rib fractures BPH Myasthenia gravis Lymphoblastic lymphoma MGUS Transaminitis Pancreatic lesion   Patient has been admitted into virtual hospice GIP status and will receive full comfort measures.  Please see palliative care consultation notes and orders.   Status is: GIP virtual inpatient hospice   Family Communication:   daughter updated    Consultants:  palliative care   Code Status:  DNR--confirmed with daughter  Procedures: As Listed in Progress Note Above  Antibiotics: Zosyn 12/20>> Azithro 12/29>>  Subjective: Patient unresponsive but appears comfortable, no respiratory distress noted.    Objective: Vitals:   11/07/21 1438 11/07/21 2039 11/08/21 0433 11/08/21 1323  BP: 129/68 102/66 138/73 112/69  Pulse: (!) 132 96 (!) 104 (!) 124  Resp: 15 18 20  20  Temp: 98.3 F (36.8 C) 98.6 F (37 C) 99.1 F (37.3 C) (!) 100.4 F (38 C)  TempSrc: Oral Oral Oral Oral  SpO2: (!) 79%   (!) 82%    Intake/Output Summary (Last 24 hours) at 2021-12-06 0948 Last data filed at 11/08/2021 1830 Gross per 24 hour  Intake 0 ml  Output 400 ml  Net -400 ml   Weight change:   Exam:  General:  frail, emaciated, somnolent, unresponsive, does not follows command appropriately, not in acute distress  Data Reviewed: I have personally reviewed following labs and imaging studies  Basic Metabolic Panel: Recent Labs  Lab 11/02/21 1325 11/03/21 0421 11/03/21 0450 11/04/21 0357  NA 141 143  --  145  K 4.3 3.8  --  3.2*  CL 105 110  --  113*  CO2 24 22  --  23  GLUCOSE 117* 109*  --  98  BUN 70* 67*  --  71*  CREATININE 1.66* 1.33*  --  1.40*  CALCIUM 9.4 8.8*  --  8.7*  MG  --   --  2.5* 2.6*   Liver Function Tests: Recent Labs  Lab 11/02/21 1325 11/03/21 0421 11/04/21 0357  AST 113* 78* 107*  ALT 79* 65* 81*  ALKPHOS 121 91 92  BILITOT 1.1 0.3 0.8  PROT 6.9 5.4* 4.8*  ALBUMIN 2.8* 2.2* 2.0*   No results for input(s): LIPASE, AMYLASE in the last 168 hours. Recent Labs  Lab 11/02/21 1328  AMMONIA 23   Coagulation Profile: Recent Labs  Lab 11/02/21 1325  INR 1.3*   CBC: Recent Labs  Lab 11/02/21 1325 11/03/21 0421 11/04/21 0357  WBC 18.6* 12.9* 6.9  NEUTROABS 16.9*  --   --   HGB 11.3* 8.6* 8.5*  HCT 36.2* 28.1* 28.2*  MCV 93.1 93.7 94.9  PLT 323 215 218   Cardiac Enzymes: Recent Labs  Lab 11/03/21 0450  CKTOTAL 37*   BNP: Invalid input(s): POCBNP CBG: Recent Labs  Lab 11/04/21 1122  GLUCAP 81   HbA1C: No results for input(s): HGBA1C in the last 72 hours. Urine analysis:    Component Value Date/Time   COLORURINE YELLOW 11/02/2021 1613   APPEARANCEUR HAZY (A) 11/02/2021 1613   LABSPEC 1.018 11/02/2021 1613   PHURINE 5.0 11/02/2021 1613   GLUCOSEU NEGATIVE 11/02/2021 1613   HGBUR SMALL (A) 11/02/2021 1613   BILIRUBINUR NEGATIVE 11/02/2021 1613   KETONESUR NEGATIVE 11/02/2021 1613   PROTEINUR 30 (A) 11/02/2021 1613   NITRITE NEGATIVE 11/02/2021 1613   LEUKOCYTESUR MODERATE (A) 11/02/2021 1613   Recent Results (from the past 240 hour(s))  Culture, blood (Routine x 2)     Status: None   Collection Time:  11/02/21  1:23 PM   Specimen: BLOOD RIGHT FOREARM  Result Value Ref Range Status   Specimen Description BLOOD RIGHT FOREARM  Final   Special Requests   Final    BOTTLES DRAWN AEROBIC AND ANAEROBIC Blood Culture adequate volume   Culture   Final    NO GROWTH 5 DAYS Performed at [facility], [address], [place], [place] [address]    Report Status 11/07/2021 FINAL  Final  Culture, blood (Routine x 2)     Status: None   Collection Time: 11/02/21  1:24 PM   Specimen: BLOOD LEFT HAND  Result Value Ref Range Status   Specimen Description BLOOD LEFT HAND  Final   Special Requests   Final    BOTTLES DRAWN AEROBIC AND ANAEROBIC Blood Culture results may  not be optimal due to an inadequate volume of blood received in culture bottles   Culture   Final    NO GROWTH 5 DAYS Performed at [facility], [address], [place], [place] [address]    Report Status 11/07/2021 FINAL  Final  Resp Panel by RT-PCR (Flu A&B, Covid) Nasopharyngeal Swab     Status: None   Collection Time: 11/02/21  1:25 PM   Specimen: Nasopharyngeal Swab; Nasopharyngeal(NP) swabs in vial transport medium  Result Value Ref Range Status   SARS Coronavirus 2 by RT PCR NEGATIVE NEGATIVE Final    Comment: (NOTE) SARS-CoV-2 target nucleic acids are NOT DETECTED.  The SARS-CoV-2 RNA is generally detectable in upper respiratory specimens during the acute phase of infection. The lowest concentration of SARS-CoV-2 viral copies this assay can detect is 138 copies/mL. A negative result does not preclude SARS-Cov-2 infection and should not be used as the sole basis for treatment or other patient management decisions. A negative result may occur with  improper specimen collection/handling, submission of specimen other than nasopharyngeal swab, presence of viral mutation(s) within the areas targeted by this assay, and inadequate number of viral copies(<138 copies/mL). A negative result must be combined with clinical observations,  patient history, and epidemiological information. The expected result is Negative.  Fact Sheet for Patients:  EntrepreneurPulse.com.au  Fact Sheet for Healthcare Providers:  IncredibleEmployment.be  This test is no t yet approved or cleared by the [place] FDA and  has been authorized for detection and/or diagnosis of SARS-CoV-2 by FDA under an Emergency Use Authorization (EUA). This EUA will remain  in effect (meaning this test can be used) for the duration of the COVID-19 declaration under Section 564(b)(1) of the Act, 21 U.S.C.section 360bbb-3(b)(1), unless the authorization is terminated  or revoked sooner.       Influenza A by PCR NEGATIVE NEGATIVE Final   Influenza B by PCR NEGATIVE NEGATIVE Final    Comment: (NOTE) The Xpert Xpress SARS-CoV-2/FLU/RSV plus assay is intended as an aid in the diagnosis of influenza from Nasopharyngeal swab specimens and should not be used as a sole basis for treatment. Nasal washings and aspirates are unacceptable for Xpert Xpress SARS-CoV-2/FLU/RSV testing.  Fact Sheet for Patients: EntrepreneurPulse.com.au  Fact Sheet for Healthcare Providers: IncredibleEmployment.be  This test is not yet approved or cleared by the [place] FDA and has been authorized for detection and/or diagnosis of SARS-CoV-2 by FDA under an Emergency Use Authorization (EUA). This EUA will remain in effect (meaning this test can be used) for the duration of the COVID-19 declaration under Section 564(b)(1) of the Act, 21 U.S.C. section 360bbb-3(b)(1), unless the authorization is terminated or revoked.  Performed at [facility], [address], [place], [place] [address]   Urine Culture     Status: Abnormal   Collection Time: 11/02/21  4:13 PM   Specimen: Urine, Catheterized  Result Value Ref Range Status   Specimen Description   Final    URINE, CATHETERIZED Performed at [facility], [address] [place], [place] [address]    Special Requests   Final    NONE Performed at [facility], [address], [place], [place] [address]    Culture MULTIPLE SPECIES PRESENT, SUGGEST RECOLLECTION (A)  Final   Report Status 11/03/2021 FINAL  Final  C Difficile Quick Screen w PCR reflex     Status: None   Collection Time: 11/02/21  4:14 PM   Specimen: STOOL  Result Value Ref Range Status  C Diff antigen NEGATIVE NEGATIVE Final   C Diff toxin NEGATIVE NEGATIVE Final   C Diff interpretation No C. difficile detected.  Final    Comment: Performed at [facility], [address], [place], [place] [address]  MRSA Next Gen by PCR, Nasal     Status: None   Collection Time: 11/02/21  7:50 PM   Specimen: Nasal Mucosa; Nasal Swab  Result Value Ref Range Status   MRSA by PCR Next Gen NOT DETECTED NOT DETECTED Final    Comment: (NOTE) The GeneXpert MRSA Assay (FDA approved for NASAL specimens only), is one component of a comprehensive MRSA colonization surveillance program. It is not intended to diagnose MRSA infection nor to guide or monitor treatment for MRSA infections. Test performance is not FDA approved in patients less than 5 years old. Performed at [facility], [address], [place], [place] [address]      Scheduled Meds:   HYDROmorphone (DILAUDID) injection  1 mg Intravenous Q4H   Continuous Infusions:  Procedures/Studies: CT Head Wo Contrast  Result Date: 11/02/2021 CLINICAL DATA:  Sepsis.  Fall.  Head trauma. EXAM: CT HEAD WITHOUT CONTRAST TECHNIQUE: Contiguous axial images were obtained from the base of the skull through the vertex without intravenous contrast. COMPARISON:  10/19/2021 FINDINGS: Brain: Generalized brain atrophy. Mild chronic small-vessel ischemic change of the white matter. No sign of acute infarction, mass lesion, hemorrhage, hydrocephalus or extra-axial collection. Vascular: There is atherosclerotic calcification of the major vessels at the base  of the brain. Skull: Negative Sinuses/Orbits: Clear/normal Other: None IMPRESSION: No acute or traumatic finding. Age related atrophy. Mild chronic small-vessel ischemic change of the white matter. Electronically Signed   By: Nelson Chimes M.D.   On: 11/02/2021 16:26   CT CHEST ABDOMEN PELVIS W CONTRAST  Result Date: 11/02/2021 CLINICAL DATA:  Polytrauma, blunt fall, hypoxia, sepsis EXAM: CT CHEST, ABDOMEN, AND PELVIS WITH CONTRAST TECHNIQUE: Multidetector CT imaging of the chest, abdomen and pelvis was performed following the standard protocol during bolus administration of intravenous contrast. CONTRAST:  8mL OMNIPAQUE IOHEXOL 300 MG/ML  SOLN COMPARISON:  CT chest, abdomen, pelvis 10/19/2021. CT chest abdomen pelvis 09/21/2021 FINDINGS: CHEST: Ports and Devices: None. Lungs/airways: Diffuse peribronchovascular ground-glass and hypodense heterogeneous consolidative airspace opacities that are most prominent within the bilateral lower lobes. Limited evaluation for pulmonary nodule due to motion artifact and consolidation. No pulmonary mass. No pulmonary contusion or laceration. No pneumatocele formation. Debris within the trachea. Otherwise the central airways are patent. Pleura: No pleural effusion. No pneumothorax. No hemothorax. Lymph Nodes: Enlarged mediastinal lymph nodes: 1.3 cm aortopulmonary window. No hilar or axillary lymphadenopathy. Mediastinum: No pneumomediastinum. No aortic injury or mediastinal hematoma. The thoracic aorta is normal in caliber. At least moderate atherosclerotic plaque. Coronary artery calcifications. The heart is normal in size. No significant pericardial effusion. The main pulmonary artery is normal in caliber. No central pulmonary embolus. The esophagus is unremarkable. The thyroid is unremarkable. Chest Wall / Breasts: No chest wall mass. Musculoskeletal: Diffusely decreased bone density. No acute rib or sternal fracture; however, markedly limited evaluation due to  respiratory motion artifact. Known subacute rib fractures are again noted and are poorly evaluation due to motion artifact. No acute spinal fracture. ABDOMEN / PELVIS: Liver: Not enlarged. No focal lesion. No laceration or subcapsular hematoma. Biliary System: The gallbladder is otherwise unremarkable with no radio-opaque gallstones. No biliary ductal dilatation. Pancreas: Atrophic. There is a 1.2 x 1 cm hypodense lesion within the proximal pancreatic body (2:72). No main pancreatic  duct dilatation. Spleen: Not enlarged. No focal lesion. No laceration, subcapsular hematoma, or vascular injury. Adrenal Glands: No nodularity bilaterally. Kidneys: Bilateral kidneys enhance symmetrically. No hydronephrosis. No contusion, laceration, or subcapsular hematoma. No injury to the vascular structures or collecting systems. No hydroureter. The urinary bladder is unremarkable. Bowel: No small or large bowel wall thickening or dilatation. Scattered colonic diverticulosis. The appendix is unremarkable. Mesentery, Omentum, and Peritoneum: Redemonstration of a grossly similar-appearing 3.6 x 3.6 x 5.2 Cm fluid density lesion within the left upper abdomen. No associated peripheral enhancement. No simple free fluid ascites. No pneumoperitoneum. No hemoperitoneum. No mesenteric hematoma identified. No organized fluid collection. Pelvic Organs: Normal. Lymph Nodes: No abdominal, pelvic, inguinal lymphadenopathy. Vasculature: The main portal, splenic, superior mesenteric veins are patent. Atherosclerotic plaque. No abdominal aorta or iliac aneurysm. No active contrast extravasation or pseudoaneurysm. Musculoskeletal: No significant soft tissue hematoma. Diffusely decreased bone density. No acute pelvic fracture. Chronic stable L4-L5 compression fractures. No acute spinal fracture. Redemonstration of subacute right L1-L2 transverse process fractures. Redemonstration of subacute S3 and S4 vertebral body fractures. IMPRESSION: 1. Pulmonary  findings consistent with infection/inflammation. Associated debris within the trachea. Followup PA and lateral chest X-ray is recommended in 3-4 weeks following therapy to ensure resolution and exclude underlying malignancy. 2. Mediastinal lymphadenopathy likely reactive in etiology. Attention on follow-up. 3. No acute traumatic injury to the chest, abdomen, or pelvis. 4. No acute fracture or traumatic malalignment of the thoracic or lumbar spine.Redemonstration of subacute right L1-L2 transverse process as well as S3 and S4 fractures. These were acute on 10/19/2021. 5. Markedly limited evaluation of the ribs for acute fracture due to respiratory motion artifact. Known subacute rib fractures are again noted but poorly evaluated due to motion artifact. Other imaging findings of potential clinical significance: 1. Grossly similar-appearing 3.6 x 3.6 x 5.2cm fluid density lesion within the left upper abdomen. 2. Slightly more prominent due to administration of intravenous contrast, indeterminate 1.2 x 1 cm hypodense lesion within the proximal pancreatic body-consider nonemergent MRI pancreatic protocol. When the patient is clinically stable and able to follow directions and hold their breath (preferably as an outpatient) further evaluation with dedicated abdominal MRI should be considered. 3. Aortic Atherosclerosis (ICD10-I70.0) including coronary artery calcifications. 4. Diffusely decreased bone density. Electronically Signed   By: Iven Finn M.D.   On: 11/02/2021 16:46   DG Chest Portable 1 View  Result Date: 11/02/2021 CLINICAL DATA:  Pt diagnosed with pneumonia at [facility] on 11/01/2021, Cough, sob, congestion Neg covid 09/11/21 EXAM: PORTABLE CHEST - 1 VIEW COMPARISON:  10/19/2021 FINDINGS: New left perihilar airspace disease and some patchy left retrocardiac infiltrate. Right lung remains clear. Heart size and mediastinal contours are within normal limits. Aortic Atherosclerosis (ICD10-170.0). No effusion.   No pneumothorax. Visualized bones unremarkable. IMPRESSION: 1. New left perihilar airspace disease and patchy left retrocardiac opacities suggesting pneumonia. Electronically Signed   By: Lucrezia Europe M.D.   On: 11/02/2021 15:21    Revanth Neidig Wynetta Emery, MD How to contact the [facility] Attending or Consulting provider [place] or covering provider during after hours [place], for this patient?  Check the care team in [facility] and look for a) attending/consulting [facility] provider listed and b) the [facility] team listed Log into [email or web address] and use [facility] universal password to access. If you do not have the password, please contact the hospital operator. Locate the [facility] provider you are looking for under [facility] and page to a number that you can be directly reached. If  you still have difficulty reaching the provider, please page the [facility] (Director on Call) for the Hospitalists listed on amion for assistance.   [facility]  If 7PM-7AM, please contact night-coverage [email or web address] Password [email or web address] November 29, 2021, 9:48 AM   LOS: 4 days

## 2021-11-15 DEATH — deceased

## 2021-11-24 ENCOUNTER — Other Ambulatory Visit (HOSPITAL_COMMUNITY): Payer: Medicare Other

## 2021-12-01 ENCOUNTER — Ambulatory Visit (HOSPITAL_COMMUNITY): Payer: Medicare Other | Admitting: Hematology

## 2021-12-07 ENCOUNTER — Ambulatory Visit: Payer: Medicare Other | Admitting: Cardiology
